# Patient Record
Sex: Female | Born: 1955
Health system: Southern US, Community
[De-identification: ages and names within clinical notes are randomized; demographics above are authoritative.]

## PROBLEM LIST (undated history)

## (undated) DIAGNOSIS — D649 Anemia, unspecified: Secondary | ICD-10-CM

## (undated) DIAGNOSIS — E079 Disorder of thyroid, unspecified: Secondary | ICD-10-CM

## (undated) DIAGNOSIS — R000 Tachycardia, unspecified: Secondary | ICD-10-CM

## (undated) DIAGNOSIS — IMO0002 Reserved for concepts with insufficient information to code with codable children: Secondary | ICD-10-CM

## (undated) DIAGNOSIS — K2970 Gastritis, unspecified, without bleeding: Secondary | ICD-10-CM

## (undated) DIAGNOSIS — K760 Fatty (change of) liver, not elsewhere classified: Secondary | ICD-10-CM

## (undated) DIAGNOSIS — H269 Unspecified cataract: Secondary | ICD-10-CM

## (undated) DIAGNOSIS — I10 Essential (primary) hypertension: Secondary | ICD-10-CM

## (undated) DIAGNOSIS — K648 Other hemorrhoids: Secondary | ICD-10-CM

## (undated) DIAGNOSIS — D126 Benign neoplasm of colon, unspecified: Secondary | ICD-10-CM

## (undated) DIAGNOSIS — R0681 Apnea, not elsewhere classified: Secondary | ICD-10-CM

## (undated) DIAGNOSIS — R011 Cardiac murmur, unspecified: Secondary | ICD-10-CM

## (undated) DIAGNOSIS — E0590 Thyrotoxicosis, unspecified without thyrotoxic crisis or storm: Secondary | ICD-10-CM

## (undated) DIAGNOSIS — M1990 Unspecified osteoarthritis, unspecified site: Secondary | ICD-10-CM

## (undated) DIAGNOSIS — K2940 Chronic atrophic gastritis without bleeding: Secondary | ICD-10-CM

## (undated) DIAGNOSIS — M8580 Other specified disorders of bone density and structure, unspecified site: Secondary | ICD-10-CM

## (undated) DIAGNOSIS — M25511 Pain in right shoulder: Secondary | ICD-10-CM

## (undated) HISTORY — DX: Other hemorrhoids: K64.8

## (undated) HISTORY — DX: Thyrotoxicosis, unspecified without thyrotoxic crisis or storm: E05.90

## (undated) HISTORY — PX: TUBAL LIGATION: SHX77

## (undated) HISTORY — DX: Chronic atrophic gastritis without bleeding: K29.40

## (undated) HISTORY — PX: COLONOSCOPY: SHX174

## (undated) HISTORY — DX: Essential (primary) hypertension: I10

## (undated) HISTORY — DX: Tachycardia, unspecified: R00.0

## (undated) HISTORY — DX: Unspecified osteoarthritis, unspecified site: M19.90

## (undated) HISTORY — DX: Gastritis, unspecified, without bleeding: K29.70

## (undated) HISTORY — DX: Disorder of thyroid, unspecified: E07.9

## (undated) HISTORY — DX: Benign neoplasm of colon, unspecified: D12.6

## (undated) HISTORY — PX: UPPER GASTROINTESTINAL ENDOSCOPY: SHX188

## (undated) HISTORY — DX: Apnea, not elsewhere classified: R06.81

## (undated) HISTORY — DX: Anemia, unspecified: D64.9

## (undated) HISTORY — PX: HAND SURGERY: SHX662

## (undated) HISTORY — DX: Pain in right shoulder: M25.511

## (undated) HISTORY — DX: Cardiac murmur, unspecified: R01.1

## (undated) HISTORY — DX: Unspecified cataract: H26.9

## (undated) HISTORY — DX: Fatty (change of) liver, not elsewhere classified: K76.0

## (undated) HISTORY — DX: Reserved for concepts with insufficient information to code with codable children: IMO0002

## (undated) HISTORY — DX: Other specified disorders of bone density and structure, unspecified site: M85.80

---

## 1999-08-17 DIAGNOSIS — M25511 Pain in right shoulder: Secondary | ICD-10-CM

## 1999-08-17 HISTORY — DX: Pain in right shoulder: M25.511

## 2000-08-16 HISTORY — PX: SHOULDER SURGERY: SHX246

## 2010-08-16 HISTORY — PX: BREAST CYST ASPIRATION: SHX578

## 2011-07-04 LAB — HM COLONOSCOPY

## 2012-08-16 HISTORY — PX: SHOULDER SURGERY: SHX246

## 2013-06-05 ENCOUNTER — Telehealth: Payer: Self-pay | Admitting: Internal Medicine

## 2013-06-05 NOTE — Telephone Encounter (Signed)
Ok Thx 

## 2013-06-05 NOTE — Telephone Encounter (Signed)
Ms. Garmany just moved to [facility].  She is Clara Mapp's sister and Trey Paula Mines's Aunt.  Trey Paula called to ask if you will take her as a patient.  She has [employer].

## 2013-06-06 NOTE — Telephone Encounter (Signed)
Pt aware.  Appt Nov 4.

## 2013-06-19 ENCOUNTER — Ambulatory Visit: Payer: Self-pay | Admitting: Internal Medicine

## 2013-07-03 ENCOUNTER — Encounter: Payer: Self-pay | Admitting: Internal Medicine

## 2013-07-03 ENCOUNTER — Ambulatory Visit (INDEPENDENT_AMBULATORY_CARE_PROVIDER_SITE_OTHER): Payer: BC Managed Care – PPO | Admitting: Internal Medicine

## 2013-07-03 ENCOUNTER — Other Ambulatory Visit (INDEPENDENT_AMBULATORY_CARE_PROVIDER_SITE_OTHER): Payer: BC Managed Care – PPO

## 2013-07-03 VITALS — BP 130/88 | HR 76 | Temp 98.8°F | Resp 16 | Ht 64.0 in | Wt 169.0 lb

## 2013-07-03 DIAGNOSIS — Z0000 Encounter for general adult medical examination without abnormal findings: Secondary | ICD-10-CM

## 2013-07-03 DIAGNOSIS — S43439A Superior glenoid labrum lesion of unspecified shoulder, initial encounter: Secondary | ICD-10-CM | POA: Insufficient documentation

## 2013-07-03 DIAGNOSIS — S43432S Superior glenoid labrum lesion of left shoulder, sequela: Secondary | ICD-10-CM

## 2013-07-03 DIAGNOSIS — I119 Hypertensive heart disease without heart failure: Secondary | ICD-10-CM

## 2013-07-03 DIAGNOSIS — I10 Essential (primary) hypertension: Secondary | ICD-10-CM | POA: Insufficient documentation

## 2013-07-03 DIAGNOSIS — E559 Vitamin D deficiency, unspecified: Secondary | ICD-10-CM

## 2013-07-03 DIAGNOSIS — IMO0002 Reserved for concepts with insufficient information to code with codable children: Secondary | ICD-10-CM

## 2013-07-03 DIAGNOSIS — E0590 Thyrotoxicosis, unspecified without thyrotoxic crisis or storm: Secondary | ICD-10-CM

## 2013-07-03 LAB — URINALYSIS
Ketones, ur: NEGATIVE
Nitrite: NEGATIVE
Total Protein, Urine: NEGATIVE
Urine Glucose: NEGATIVE
pH: 6 (ref 5.0–8.0)

## 2013-07-03 LAB — CBC WITH DIFFERENTIAL/PLATELET
Basophils Relative: 0.6 % (ref 0.0–3.0)
Eosinophils Absolute: 0 10*3/uL (ref 0.0–0.7)
HCT: 39.1 % (ref 36.0–46.0)
Hemoglobin: 13.4 g/dL (ref 12.0–15.0)
Lymphocytes Relative: 31.1 % (ref 12.0–46.0)
Lymphs Abs: 1.6 10*3/uL (ref 0.7–4.0)
MCHC: 34.3 g/dL (ref 30.0–36.0)
MCV: 89.7 fl (ref 78.0–100.0)
Monocytes Absolute: 0.3 10*3/uL (ref 0.1–1.0)
Neutro Abs: 3.2 10*3/uL (ref 1.4–7.7)
Platelets: 360 10*3/uL (ref 150.0–400.0)
RBC: 4.36 Mil/uL (ref 3.87–5.11)

## 2013-07-03 LAB — LIPID PANEL
HDL: 39.6 mg/dL (ref >39.00)
LDL Cholesterol: 88 mg/dL (ref 0–99)
Total CHOL/HDL Ratio: 4
VLDL: 24.2 mg/dL (ref 0.0–40.0)

## 2013-07-03 LAB — BASIC METABOLIC PANEL
CO2: 31 mEq/L (ref 19–32)
Calcium: 9.4 mg/dL (ref 8.4–10.5)
Chloride: 101 mEq/L (ref 96–112)
Creatinine, Ser: 0.8 mg/dL (ref 0.4–1.2)
Potassium: 3.6 mEq/L (ref 3.5–5.1)
Sodium: 138 mEq/L (ref 135–145)

## 2013-07-03 LAB — HEPATIC FUNCTION PANEL
AST: 23 U/L (ref 0–37)
Alkaline Phosphatase: 85 U/L (ref 39–117)
Total Bilirubin: 0.4 mg/dL (ref 0.3–1.2)
Total Protein: 8 g/dL (ref 6.0–8.3)

## 2013-07-03 MED ORDER — VITAMIN D 1000 UNITS PO TABS: 1000.0000 [IU] | ORAL_TABLET | Freq: Every day | ORAL | Status: AC

## 2013-07-03 MED ORDER — ERGOCALCIFEROL 1.25 MG (50000 UT) PO CAPS: 50000.0000 [IU] | ORAL_CAPSULE | ORAL | Status: DC

## 2013-07-03 NOTE — Progress Notes (Signed)
Pre visit review using our clinic review tool, if applicable. No additional management support is needed unless otherwise documented below in the visit note. 

## 2013-07-03 NOTE — Assessment & Plan Note (Addendum)
L 2001 Chronic pain On Avinza and Oxycodone since 2007 per [facility]  Potential benefits of a long term opioids use as well as potential risks (i.e. addiction risk, apnea etc) and complications (i.e. Somnolence, constipation and others) were explained to the patient and were aknowledged.

## 2013-07-03 NOTE — Assessment & Plan Note (Signed)
Chronic Off PTU since 2013 Dr Willaim Bane in [place] 9/14 ok

## 2013-07-03 NOTE — Assessment & Plan Note (Signed)
Start Rx 

## 2013-07-03 NOTE — Progress Notes (Signed)
  Subjective:    HPI  New pt The patient presents to establish PC for chronic hypertension, chronic hyperthyroidism, chronic R shoulder pain controlled w/opioids controlled with medicines. She was recently told that her vit D is low.    Review of Systems  Constitutional: Negative for chills, activity change, appetite change, fatigue and unexpected weight change.  HENT: Negative for congestion, mouth sores and sinus pressure.   Eyes: Negative for visual disturbance.  Respiratory: Negative for cough and chest tightness.   Gastrointestinal: Negative for nausea and abdominal pain.  Genitourinary: Negative for frequency, difficulty urinating and vaginal pain.  Musculoskeletal: Positive for arthralgias (R shoulder). Negative for back pain, gait problem and neck pain.  Skin: Negative for pallor and rash.  Neurological: Negative for dizziness, tremors, weakness, numbness and headaches.  Psychiatric/Behavioral: Negative for suicidal ideas, confusion, sleep disturbance and dysphoric mood. The patient is not nervous/anxious.        Objective:   Physical Exam  Constitutional: She appears well-developed. No distress.  HENT:  Head: Normocephalic.  Right Ear: External ear normal.  Left Ear: External ear normal.  Nose: Nose normal.  Mouth/Throat: Oropharynx is clear and moist.  Eyes: Conjunctivae are normal. Pupils are equal, round, and reactive to light. Right eye exhibits no discharge. Left eye exhibits no discharge.  Neck: Normal range of motion. Neck supple. No JVD present. No tracheal deviation present. No thyromegaly present.  Cardiovascular: Normal rate, regular rhythm and normal heart sounds.   Pulmonary/Chest: No stridor. No respiratory distress. She has no wheezes.  Abdominal: Soft. Bowel sounds are normal. She exhibits no distension and no mass. There is no tenderness. There is no rebound and no guarding.  Musculoskeletal: She exhibits tenderness (R shoulder is tender w/ROM). She  exhibits no edema.  Lymphadenopathy:    She has no cervical adenopathy.  Neurological: She displays normal reflexes. No cranial nerve deficit. She exhibits normal muscle tone. Coordination normal.  Skin: No rash noted. No erythema.  Psychiatric: She has a normal mood and affect. Her behavior is normal. Judgment and thought content normal.     Labs reviewed     Assessment & Plan:

## 2013-07-03 NOTE — Assessment & Plan Note (Signed)
Controlled now Continue with current prescription therapy as reflected on the Med list.

## 2013-07-03 NOTE — Assessment & Plan Note (Signed)
ECHO 2014 moderate LVH Continue with current prescription therapy as reflected on the Med list. Labs

## 2013-07-04 ENCOUNTER — Ambulatory Visit: Payer: BC Managed Care – PPO

## 2013-07-04 DIAGNOSIS — E0590 Thyrotoxicosis, unspecified without thyrotoxic crisis or storm: Secondary | ICD-10-CM

## 2013-07-04 LAB — VITAMIN D 25 HYDROXY (VIT D DEFICIENCY, FRACTURES): Vit D, 25-Hydroxy: 16 ng/mL — ABNORMAL LOW (ref 30–89)

## 2013-07-04 LAB — T4, FREE: Free T4: 1.18 ng/dL (ref 0.60–1.60)

## 2013-08-08 ENCOUNTER — Other Ambulatory Visit: Payer: Self-pay | Admitting: Internal Medicine

## 2013-08-14 ENCOUNTER — Ambulatory Visit (INDEPENDENT_AMBULATORY_CARE_PROVIDER_SITE_OTHER)
Admission: RE | Admit: 2013-08-14 | Discharge: 2013-08-14 | Disposition: A | Discharge status: Home / Self Care | Payer: BC Managed Care – PPO | Source: Ambulatory Visit | Attending: Internal Medicine | Admitting: Internal Medicine

## 2013-08-14 ENCOUNTER — Ambulatory Visit (INDEPENDENT_AMBULATORY_CARE_PROVIDER_SITE_OTHER): Payer: BC Managed Care – PPO | Admitting: Internal Medicine

## 2013-08-14 ENCOUNTER — Encounter: Payer: Self-pay | Admitting: Internal Medicine

## 2013-08-14 VITALS — BP 160/88 | HR 76 | Temp 98.1°F | Resp 16 | Wt 169.0 lb

## 2013-08-14 DIAGNOSIS — R042 Hemoptysis: Secondary | ICD-10-CM | POA: Insufficient documentation

## 2013-08-14 DIAGNOSIS — H6690 Otitis media, unspecified, unspecified ear: Secondary | ICD-10-CM

## 2013-08-14 DIAGNOSIS — H6692 Otitis media, unspecified, left ear: Secondary | ICD-10-CM

## 2013-08-14 DIAGNOSIS — J069 Acute upper respiratory infection, unspecified: Secondary | ICD-10-CM | POA: Insufficient documentation

## 2013-08-14 MED ORDER — OLMESARTAN-AMLODIPINE-HCTZ 20-5-12.5 MG PO TABS: 1.0000 | ORAL_TABLET | Freq: Every day | ORAL | Status: DC

## 2013-08-14 MED ORDER — MORPHINE SULFATE ER BEADS 60 MG PO CP24: 60.0000 mg | ORAL_CAPSULE | Freq: Every day | ORAL | Status: DC

## 2013-08-14 MED ORDER — LEVOFLOXACIN 500 MG PO TABS: 500.0000 mg | ORAL_TABLET | Freq: Every day | ORAL | Status: DC

## 2013-08-14 NOTE — Assessment & Plan Note (Signed)
12/16 likely due to sinusitis/bronchitis CXR Levaquin x 10 d 

## 2013-08-14 NOTE — Progress Notes (Signed)
   Subjective:    URI  This is a new problem. The current episode started in the past 7 days. The problem has been gradually worsening. There has been no fever. Associated symptoms include congestion, coughing, sinus pain, a sore throat and swollen glands. Pertinent negatives include no abdominal pain, chest pain, headaches, nausea, neck pain or rash. Associated symptoms comments: Coughed up blood. She has tried antihistamine, decongestant and NSAIDs for the symptoms. The treatment provided mild relief.     The patient presents  For a f/up on chronic hypertension, chronic hyperthyroidism, chronic R shoulder pain controlled w/opioids controlled with medicines. She was recently told that her vit D is low.    Review of Systems  Constitutional: Negative for chills, activity change, appetite change, fatigue and unexpected weight change.  HENT: Positive for congestion and sore throat. Negative for mouth sores and sinus pressure.   Eyes: Negative for visual disturbance.  Respiratory: Positive for cough. Negative for chest tightness.   Cardiovascular: Negative for chest pain.  Gastrointestinal: Negative for nausea and abdominal pain.  Genitourinary: Negative for frequency, difficulty urinating and vaginal pain.  Musculoskeletal: Positive for arthralgias (R shoulder). Negative for back pain, gait problem and neck pain.  Skin: Negative for pallor and rash.  Neurological: Negative for dizziness, tremors, weakness, numbness and headaches.  Psychiatric/Behavioral: Negative for suicidal ideas, confusion, sleep disturbance and dysphoric mood. The patient is not nervous/anxious.        Objective:   Physical Exam  Constitutional: She appears well-developed. No distress.  HENT:  Head: Normocephalic.  Right Ear: External ear normal.  Left Ear: External ear normal.  Nose: Nose normal.  Mouth/Throat: Oropharynx is clear and moist.  Eyes: Conjunctivae are normal. Pupils are equal, round, and reactive  to light. Right eye exhibits no discharge. Left eye exhibits no discharge.  Neck: Normal range of motion. Neck supple. No JVD present. No tracheal deviation present. No thyromegaly present.  Cardiovascular: Normal rate, regular rhythm and normal heart sounds.   Pulmonary/Chest: No stridor. No respiratory distress. She has no wheezes.  Abdominal: Soft. Bowel sounds are normal. She exhibits no distension and no mass. There is no tenderness. There is no rebound and no guarding.  Musculoskeletal: She exhibits tenderness (R shoulder is tender w/ROM). She exhibits no edema.  Lymphadenopathy:    She has no cervical adenopathy.  Neurological: She displays normal reflexes. No cranial nerve deficit. She exhibits normal muscle tone. Coordination normal.  Skin: No rash noted. No erythema.  Psychiatric: She has a normal mood and affect. Her behavior is normal. Judgment and thought content normal.  pt brought bloody mucus in the plastic bag   Labs reviewed     Assessment & Plan:

## 2013-08-14 NOTE — Patient Instructions (Signed)
Use over-the-counter  "cold" medicines  such as  "Afrin" nasal spray for nasal congestion as directed instead. Use" Delsym" or" Robitussin" cough syrup varietis for cough.  You can use plain "Tylenol" or "Advil" for fever, chills and achyness.  Please, make an appointment if you are not better or if you're worse.  

## 2013-08-14 NOTE — Assessment & Plan Note (Signed)
12/16 likely due to sinusitis/bronchitis CXR Levaquin x 10 d

## 2013-08-14 NOTE — Progress Notes (Signed)
Pre visit review using our clinic review tool, if applicable. No additional management support is needed unless otherwise documented below in the visit note. 

## 2013-08-17 ENCOUNTER — Telehealth: Payer: Self-pay | Admitting: Internal Medicine

## 2013-08-17 NOTE — Telephone Encounter (Signed)
Patient called stating the antibiotic that was given to her last week is not helping, patient would like to know if she can  Get drops for her ear - please advise

## 2013-08-19 NOTE — Telephone Encounter (Signed)
Pls come for a ROV - we need to re-examine her ear. Thx

## 2013-08-20 ENCOUNTER — Ambulatory Visit (INDEPENDENT_AMBULATORY_CARE_PROVIDER_SITE_OTHER): Payer: BC Managed Care – PPO | Admitting: Internal Medicine

## 2013-08-20 ENCOUNTER — Encounter: Payer: Self-pay | Admitting: Internal Medicine

## 2013-08-20 VITALS — BP 138/90 | HR 76 | Temp 98.1°F | Resp 16 | Wt 166.0 lb

## 2013-08-20 DIAGNOSIS — R042 Hemoptysis: Secondary | ICD-10-CM

## 2013-08-20 DIAGNOSIS — H6690 Otitis media, unspecified, unspecified ear: Secondary | ICD-10-CM

## 2013-08-20 DIAGNOSIS — J069 Acute upper respiratory infection, unspecified: Secondary | ICD-10-CM

## 2013-08-20 DIAGNOSIS — J329 Chronic sinusitis, unspecified: Secondary | ICD-10-CM

## 2013-08-20 DIAGNOSIS — R51 Headache: Secondary | ICD-10-CM

## 2013-08-20 DIAGNOSIS — A499 Bacterial infection, unspecified: Secondary | ICD-10-CM

## 2013-08-20 DIAGNOSIS — H6692 Otitis media, unspecified, left ear: Secondary | ICD-10-CM

## 2013-08-20 DIAGNOSIS — B9689 Other specified bacterial agents as the cause of diseases classified elsewhere: Secondary | ICD-10-CM | POA: Insufficient documentation

## 2013-08-20 MED ORDER — FLUTICASONE PROPIONATE 50 MCG/ACT NA SUSP: 1.0000 | Freq: Two times a day (BID) | NASAL | Status: DC | PRN

## 2013-08-20 MED ORDER — PSEUDOEPHEDRINE HCL ER 120 MG PO TB12: 120.0000 mg | ORAL_TABLET | Freq: Two times a day (BID) | ORAL | Status: DC | PRN

## 2013-08-20 NOTE — Progress Notes (Signed)
Patient ID: Karina Newman, female   DOB: August 01, 1956, 58 y.o.   MRN: [id number]   Subjective:    URI  This is a new problem. The current episode started 1 to 4 weeks ago. The problem has been gradually worsening. There has been no fever. Associated symptoms include congestion, coughing, sinus pain, a sore throat and swollen glands. Pertinent negatives include no abdominal pain, chest pain, headaches, nausea, neck pain or rash. Associated symptoms comments: Coughed up blood. She has tried antihistamine, decongestant and NSAIDs (Levaquin) for the symptoms. The treatment provided no relief.     The patient presents  For a f/up on chronic hypertension, chronic hyperthyroidism, chronic R shoulder pain controlled w/opioids controlled with medicines. She was recently told that her vit D is low.    Review of Systems  Constitutional: Negative for chills, activity change, appetite change, fatigue and unexpected weight change.  HENT: Positive for congestion and sore throat. Negative for mouth sores and sinus pressure.   Eyes: Negative for visual disturbance.  Respiratory: Positive for cough. Negative for chest tightness.   Cardiovascular: Negative for chest pain.  Gastrointestinal: Negative for nausea and abdominal pain.  Genitourinary: Negative for frequency, difficulty urinating and vaginal pain.  Musculoskeletal: Positive for arthralgias (R shoulder). Negative for back pain, gait problem and neck pain.  Skin: Negative for pallor and rash.  Neurological: Negative for dizziness, tremors, weakness, numbness and headaches.  Psychiatric/Behavioral: Negative for suicidal ideas, confusion, sleep disturbance and dysphoric mood. The patient is not nervous/anxious.        Objective:   Physical Exam  Constitutional: She appears well-developed. No distress.  HENT:  Head: Normocephalic.  Right Ear: External ear normal.  Left Ear: External ear normal.  Nose: Nose normal.  Mouth/Throat: Oropharynx is  clear and moist.  Eyes: Conjunctivae are normal. Pupils are equal, round, and reactive to light. Right eye exhibits no discharge. Left eye exhibits no discharge.  Neck: Normal range of motion. Neck supple. No JVD present. No tracheal deviation present. No thyromegaly present.  Cardiovascular: Normal rate, regular rhythm and normal heart sounds.   Pulmonary/Chest: No stridor. No respiratory distress. She has no wheezes.  Abdominal: Soft. Bowel sounds are normal. She exhibits no distension and no mass. There is no tenderness. There is no rebound and no guarding.  Musculoskeletal: She exhibits tenderness (R shoulder is tender w/ROM). She exhibits no edema.  Lymphadenopathy:    She has no cervical adenopathy.  Neurological: She displays normal reflexes. No cranial nerve deficit. She exhibits normal muscle tone. Coordination normal.  Skin: No rash noted. No erythema.  Psychiatric: She has a normal mood and affect. Her behavior is normal. Judgment and thought content normal.  pt brought bloody mucus in the plastic bag   Labs reviewed     Assessment & Plan:

## 2013-08-20 NOTE — Assessment & Plan Note (Signed)
Cont Rx CT sinuses Sudafed, Flonase Rx 

## 2013-08-20 NOTE — Progress Notes (Signed)
Pre visit review using our clinic review tool, if applicable. No additional management support is needed unless otherwise documented below in the visit note. 

## 2013-08-20 NOTE — Assessment & Plan Note (Signed)
Cont Rx CT sinuses Sudafed, Flonase Rx

## 2013-08-20 NOTE — Assessment & Plan Note (Signed)
Better  

## 2013-08-22 ENCOUNTER — Encounter (INDEPENDENT_AMBULATORY_CARE_PROVIDER_SITE_OTHER): Payer: Self-pay

## 2013-08-22 ENCOUNTER — Ambulatory Visit (INDEPENDENT_AMBULATORY_CARE_PROVIDER_SITE_OTHER)
Admission: RE | Admit: 2013-08-22 | Discharge: 2013-08-22 | Disposition: A | Discharge status: Home / Self Care | Payer: BC Managed Care – PPO | Source: Ambulatory Visit | Attending: Internal Medicine | Admitting: Internal Medicine

## 2013-08-22 DIAGNOSIS — A499 Bacterial infection, unspecified: Secondary | ICD-10-CM

## 2013-08-22 DIAGNOSIS — R51 Headache: Secondary | ICD-10-CM

## 2013-08-22 DIAGNOSIS — J329 Chronic sinusitis, unspecified: Secondary | ICD-10-CM

## 2013-08-22 DIAGNOSIS — B9689 Other specified bacterial agents as the cause of diseases classified elsewhere: Secondary | ICD-10-CM

## 2013-10-17 ENCOUNTER — Ambulatory Visit (INDEPENDENT_AMBULATORY_CARE_PROVIDER_SITE_OTHER): Payer: Worker's Compensation | Admitting: Internal Medicine

## 2013-10-17 ENCOUNTER — Encounter: Payer: Self-pay | Admitting: Internal Medicine

## 2013-10-17 VITALS — BP 150/90 | HR 80 | Temp 98.6°F | Resp 16 | Wt 174.0 lb

## 2013-10-17 DIAGNOSIS — M25512 Pain in left shoulder: Secondary | ICD-10-CM

## 2013-10-17 DIAGNOSIS — I10 Essential (primary) hypertension: Secondary | ICD-10-CM

## 2013-10-17 DIAGNOSIS — S43439A Superior glenoid labrum lesion of unspecified shoulder, initial encounter: Secondary | ICD-10-CM

## 2013-10-17 DIAGNOSIS — M25511 Pain in right shoulder: Secondary | ICD-10-CM

## 2013-10-17 DIAGNOSIS — S43499A Other sprain of unspecified shoulder joint, initial encounter: Secondary | ICD-10-CM

## 2013-10-17 DIAGNOSIS — M25519 Pain in unspecified shoulder: Secondary | ICD-10-CM

## 2013-10-17 DIAGNOSIS — S46819A Strain of other muscles, fascia and tendons at shoulder and upper arm level, unspecified arm, initial encounter: Secondary | ICD-10-CM

## 2013-10-17 DIAGNOSIS — E559 Vitamin D deficiency, unspecified: Secondary | ICD-10-CM

## 2013-10-17 MED ORDER — MORPHINE SULFATE ER BEADS 60 MG PO CP24: 60.0000 mg | ORAL_CAPSULE | Freq: Every day | ORAL | Status: DC

## 2013-10-17 MED ORDER — OXYCODONE HCL 15 MG PO TABS: 15.0000 mg | ORAL_TABLET | Freq: Three times a day (TID) | ORAL | Status: DC | PRN

## 2013-10-17 NOTE — Progress Notes (Signed)
   Subjective:    HPI   The patient presents  For a f/up on chronic hypertension, chronic hyperthyroidism, chronic R shoulder pain controlled w/opioids controlled with medicines - worse lately.   She was recently told that her vit D is low.    Review of Systems  Constitutional: Negative for chills, activity change, appetite change, fatigue and unexpected weight change.  HENT: Negative for mouth sores and sinus pressure.   Eyes: Negative for visual disturbance.  Respiratory: Negative for chest tightness.   Genitourinary: Negative for frequency, difficulty urinating and vaginal pain.  Musculoskeletal: Positive for arthralgias (R shoulder). Negative for back pain and gait problem.  Skin: Negative for pallor.  Neurological: Negative for dizziness, tremors, weakness and numbness.  Psychiatric/Behavioral: Negative for suicidal ideas, confusion, sleep disturbance and dysphoric mood. The patient is not nervous/anxious.        Objective:   Physical Exam  Constitutional: She appears well-developed. No distress.  HENT:  Head: Normocephalic.  Right Ear: External ear normal.  Left Ear: External ear normal.  Nose: Nose normal.  Mouth/Throat: Oropharynx is clear and moist.  Eyes: Conjunctivae are normal. Pupils are equal, round, and reactive to light. Right eye exhibits no discharge. Left eye exhibits no discharge.  Neck: Normal range of motion. Neck supple. No JVD present. No tracheal deviation present. No thyromegaly present.  Cardiovascular: Normal rate, regular rhythm and normal heart sounds.   Pulmonary/Chest: No stridor. No respiratory distress. She has no wheezes.  Abdominal: Soft. Bowel sounds are normal. She exhibits no distension and no mass. There is no tenderness. There is no rebound and no guarding.  Musculoskeletal: She exhibits tenderness (R shoulder is tender w/ROM). She exhibits no edema.  Lymphadenopathy:    She has no cervical adenopathy.  Neurological: She displays  normal reflexes. No cranial nerve deficit. She exhibits normal muscle tone. Coordination normal.  Skin: No rash noted. No erythema.  Psychiatric: She has a normal mood and affect. Her behavior is normal. Judgment and thought content normal.  pt brought bloody mucus in the plastic bag   Labs reviewed     Assessment & Plan:

## 2013-10-17 NOTE — Progress Notes (Signed)
Pre visit review using our clinic review tool, if applicable. No additional management support is needed unless otherwise documented below in the visit note. 

## 2013-10-18 DIAGNOSIS — M25512 Pain in left shoulder: Secondary | ICD-10-CM

## 2013-10-18 DIAGNOSIS — M25511 Pain in right shoulder: Secondary | ICD-10-CM | POA: Insufficient documentation

## 2013-10-18 NOTE — Assessment & Plan Note (Signed)
Continue with current prescription therapy as reflected on the Med list.  

## 2013-10-18 NOTE — Assessment & Plan Note (Signed)
Chronic  Potential benefits of a long term opioids use as well as potential risks (i.e. addiction risk, apnea etc) and complications (i.e. Somnolence, constipation and others) were explained to the patient and were aknowledged. Continue with current prescription therapy as reflected on the Med list.  

## 2013-10-18 NOTE — Assessment & Plan Note (Signed)
BP Readings from Last 3 Encounters:  10/17/13 150/90  08/20/13 138/90  08/14/13 160/88  Continue with current prescription therapy as reflected on the Med list.

## 2013-12-11 ENCOUNTER — Telehealth: Payer: Self-pay | Admitting: Internal Medicine

## 2013-12-11 NOTE — Telephone Encounter (Signed)
Patient is requesting refill for avinza.  Patient states pharmacy was not able to fill last scripts until the 16th and she only has three pills left.

## 2013-12-11 NOTE — Telephone Encounter (Signed)
Patient can also be contact on cell phone

## 2013-12-12 MED ORDER — MORPHINE SULFATE ER BEADS 60 MG PO CP24: 60.0000 mg | ORAL_CAPSULE | Freq: Every day | ORAL | Status: DC

## 2013-12-12 NOTE — Telephone Encounter (Signed)
Ok Thx 

## 2013-12-12 NOTE — Telephone Encounter (Signed)
Notified pt rx ready for pick-up. Pt states she didn't need an rx. MD gave her 3 seprated prescription back in march. [facility] already has the prescriptions they was going to call md to ask him was it ok to fill...Karina Newman

## 2013-12-18 ENCOUNTER — Ambulatory Visit: Payer: Self-pay | Admitting: Internal Medicine

## 2014-01-14 ENCOUNTER — Encounter: Payer: Self-pay | Admitting: Gynecology

## 2014-01-14 ENCOUNTER — Ambulatory Visit (INDEPENDENT_AMBULATORY_CARE_PROVIDER_SITE_OTHER): Payer: Federal, State, Local not specified - PPO | Admitting: Gynecology

## 2014-01-14 ENCOUNTER — Other Ambulatory Visit (HOSPITAL_COMMUNITY)
Admission: RE | Admit: 2014-01-14 | Discharge: 2014-01-14 | Disposition: A | Discharge status: Home / Self Care | Payer: BC Managed Care – PPO | Source: Ambulatory Visit | Attending: Gynecology | Admitting: Gynecology

## 2014-01-14 VITALS — BP 126/84 | Ht 64.0 in | Wt 170.0 lb

## 2014-01-14 DIAGNOSIS — N950 Postmenopausal bleeding: Secondary | ICD-10-CM

## 2014-01-14 DIAGNOSIS — Z01419 Encounter for gynecological examination (general) (routine) without abnormal findings: Secondary | ICD-10-CM | POA: Insufficient documentation

## 2014-01-14 DIAGNOSIS — R87810 Cervical high risk human papillomavirus (HPV) DNA test positive: Secondary | ICD-10-CM | POA: Insufficient documentation

## 2014-01-14 DIAGNOSIS — N952 Postmenopausal atrophic vaginitis: Secondary | ICD-10-CM

## 2014-01-14 DIAGNOSIS — IMO0002 Reserved for concepts with insufficient information to code with codable children: Secondary | ICD-10-CM

## 2014-01-14 DIAGNOSIS — D251 Intramural leiomyoma of uterus: Secondary | ICD-10-CM

## 2014-01-14 DIAGNOSIS — Z1151 Encounter for screening for human papillomavirus (HPV): Secondary | ICD-10-CM | POA: Insufficient documentation

## 2014-01-14 HISTORY — DX: Reserved for concepts with insufficient information to code with codable children: IMO0002

## 2014-01-14 NOTE — Addendum Note (Signed)
Addended by: Nelva Nay on: 01/14/2014 12:06 PM   Modules accepted: Orders

## 2014-01-14 NOTE — Progress Notes (Signed)
Karina Newman 11-May-1956 [id number]        59 y.o.  [id number] new patient with several issues as noted below. Has not had a regular GYN exam in a number of years.  Past medical history,surgical history, problem list, medications, allergies, family history and social history were all reviewed and documented as reviewed in the EPIC chart.  ROS:  12 system ROS performed with pertinent positives and negatives included in the history, assessment and plan.  Included Systems: General, HEENT, Neck, Cardiovascular, Pulmonary, Gastrointestinal, Genitourinary, Musculoskeletal, Dermatologic, Endocrine, Hematological, Neurologic, Psychiatric Additional significant findings :  None   Exam: Kim assistant Filed Vitals:   01/14/14 1101  BP: 126/84  Height: 5\' 4"  (1.626 m)  Weight: 170 lb (77.111 kg)   General appearance:  Normal affect, orientation and appearance. Skin: Grossly normal HEENT: Without gross lesions.  No cervical or supraclavicular adenopathy. Thyroid normal.  Lungs:  Clear without wheezing, rales or rhonchi Cardiac: RR, without RMG Abdominal:  Soft, nontender, without masses, guarding, rebound, organomegaly or hernia Breasts:  Examined lying and sitting without masses, retractions, discharge or axillary adenopathy. Pelvic:  Ext/BUS/vagina with blood staining vaginal walls.  Cervix with atrophic changes. Pap/HPV done  Uterus anteverted, normal size, shape and contour, midline and mobile nontender   Adnexa  Without masses or tenderness    Anus and perineum  Normal   Rectovaginal  Normal sphincter tone without palpated masses or tenderness.    Assessment/Plan:  58 y.o. [id number] female for annual exam.   1. Postmenopausal bleeding. Patient notes menopause in the late 84s, initially with significant symptoms such as hot flashes and night sweats but these seem to have result. Has had no bleeding whatsoever up until the last several weeks when she has started to notice some bright red vaginal  staining. Does have a history of leiomyoma in the past although exam today palpates normal. Pap smear done today. We'll plan sonohysterogram to rule out endometrial abnormalities and she will schedule this. Differential up to and including uterine cancer reviewed. 2. Vaginal dryness with intercourse. Is not having any issues otherwise but notes discomfort and irritation with intercourse. Options to include OTC products as well as vaginal estrogen such as Vagifem Estrace Premarin and Osphena reviewed. Patient is not interested in prescription products but prefers trial of OTC products. She is not having global symptoms to warrant HRT at this time. 3. Pap smear number of years ago. Pap/HPV today. No history of abnormal Pap smears previously. 4. Mammography 01/2013. Will schedule followup mammogram this month. SBE monthly reviewed. 5. Colonoscopy 2012. Repeat at their recommended interval. 6. DEXA never. We'll plan further into the menopause at age 3. Increase calcium vitamin D reviewed. 7. Health maintenance. No lab work done as she actively sees Dr. Alain Marion who does all of her lab work. Followup for sonohysterogram.   Note: This document was prepared with digital dictation and possible smart phrase technology. Any transcriptional errors that result from this process are unintentional.   Anastasio Auerbach MD, 11:56 AM 01/14/2014

## 2014-01-14 NOTE — Patient Instructions (Signed)
Follow up for ultrasound as scheduled 

## 2014-01-15 LAB — CYTOLOGY - PAP

## 2014-01-21 ENCOUNTER — Encounter: Payer: Self-pay | Admitting: Gynecology

## 2014-01-23 ENCOUNTER — Encounter: Payer: Self-pay | Admitting: Gynecology

## 2014-02-04 ENCOUNTER — Ambulatory Visit (INDEPENDENT_AMBULATORY_CARE_PROVIDER_SITE_OTHER): Payer: Worker's Compensation | Admitting: Internal Medicine

## 2014-02-04 ENCOUNTER — Ambulatory Visit (INDEPENDENT_AMBULATORY_CARE_PROVIDER_SITE_OTHER)
Admission: RE | Admit: 2014-02-04 | Discharge: 2014-02-04 | Disposition: A | Discharge status: Home / Self Care | Payer: BC Managed Care – PPO | Source: Ambulatory Visit | Attending: Internal Medicine | Admitting: Internal Medicine

## 2014-02-04 ENCOUNTER — Encounter: Payer: Self-pay | Admitting: Internal Medicine

## 2014-02-04 VITALS — BP 172/86 | HR 76 | Temp 98.5°F | Resp 16 | Wt 173.0 lb

## 2014-02-04 DIAGNOSIS — E559 Vitamin D deficiency, unspecified: Secondary | ICD-10-CM

## 2014-02-04 DIAGNOSIS — M25519 Pain in unspecified shoulder: Secondary | ICD-10-CM

## 2014-02-04 DIAGNOSIS — M25512 Pain in left shoulder: Principal | ICD-10-CM

## 2014-02-04 DIAGNOSIS — M25511 Pain in right shoulder: Secondary | ICD-10-CM

## 2014-02-04 DIAGNOSIS — IMO0002 Reserved for concepts with insufficient information to code with codable children: Secondary | ICD-10-CM

## 2014-02-04 DIAGNOSIS — S43439S Superior glenoid labrum lesion of unspecified shoulder, sequela: Secondary | ICD-10-CM

## 2014-02-04 MED ORDER — OXYCODONE HCL 15 MG PO TABS: 15.0000 mg | ORAL_TABLET | Freq: Three times a day (TID) | ORAL | Status: DC | PRN

## 2014-02-04 MED ORDER — MORPHINE SULFATE ER BEADS 60 MG PO CP24: 60.0000 mg | ORAL_CAPSULE | Freq: Every day | ORAL | Status: DC

## 2014-02-04 NOTE — Assessment & Plan Note (Signed)
Worse  Continue with current prescription therapy as reflected on the Med list. R shoulder and R neck x ray

## 2014-02-04 NOTE — Assessment & Plan Note (Signed)
Continue with current prescription therapy as reflected on the Med list.  

## 2014-02-04 NOTE — Progress Notes (Signed)
Pre visit review using our clinic review tool, if applicable. No additional management support is needed unless otherwise documented below in the visit note. 

## 2014-02-04 NOTE — Assessment & Plan Note (Signed)
L 2001 Chronic pain On Avinza and Oxycodone since 2007 per [facility] R shoulder pain 2015  Potential benefits of a long term opioids use as well as potential risks (i.e. addiction risk, apnea etc) and complications (i.e. Somnolence, constipation and others) were explained to the patient and were aknowledged.

## 2014-02-04 NOTE — Progress Notes (Signed)
   Subjective:    HPI   The patient presents  For a f/up on chronic hypertension, chronic hyperthyroidism. F/u chronic R shoulder pain controlled w/opioids controlled with medicines - much worse lately. The pain is down R arm, hand. C/o muscle spasms in the R chest and R arm.   She was recently told that her vit D is low.    Review of Systems  Constitutional: Negative for chills, activity change, appetite change, fatigue and unexpected weight change.  HENT: Negative for mouth sores and sinus pressure.   Eyes: Negative for visual disturbance.  Respiratory: Negative for chest tightness.   Genitourinary: Negative for frequency, difficulty urinating and vaginal pain.  Musculoskeletal: Positive for arthralgias (R shoulder) and back pain (cervical). Negative for gait problem.  Skin: Negative for pallor.  Neurological: Negative for dizziness, tremors, weakness and numbness.  Psychiatric/Behavioral: Negative for suicidal ideas, confusion, sleep disturbance and dysphoric mood. The patient is not nervous/anxious.    B shoulders are tender w/ROM and stiff; B traps are firm and tender    Objective:   Physical Exam  Constitutional: She appears well-developed. No distress.  HENT:  Head: Normocephalic.  Right Ear: External ear normal.  Left Ear: External ear normal.  Nose: Nose normal.  Mouth/Throat: Oropharynx is clear and moist.  Eyes: Conjunctivae are normal. Pupils are equal, round, and reactive to light. Right eye exhibits no discharge. Left eye exhibits no discharge.  Neck: Normal range of motion. Neck supple. No JVD present. No tracheal deviation present. No thyromegaly present.  Cardiovascular: Normal rate, regular rhythm and normal heart sounds.   Pulmonary/Chest: No stridor. No respiratory distress. She has no wheezes.  Abdominal: Soft. Bowel sounds are normal. She exhibits no distension and no mass. There is no tenderness. There is no rebound and no guarding.  Musculoskeletal: She  exhibits tenderness (R shoulder is tender w/ROM). She exhibits no edema.  Lymphadenopathy:    She has no cervical adenopathy.  Neurological: She displays normal reflexes. No cranial nerve deficit. She exhibits normal muscle tone. Coordination normal.  Skin: No rash noted. No erythema.  Psychiatric: She has a normal mood and affect. Her behavior is normal. Judgment and thought content normal.     Labs reviewed     Assessment & Plan:

## 2014-02-06 ENCOUNTER — Other Ambulatory Visit: Payer: Self-pay | Admitting: Gynecology

## 2014-02-06 DIAGNOSIS — N950 Postmenopausal bleeding: Secondary | ICD-10-CM

## 2014-02-08 ENCOUNTER — Encounter: Payer: Self-pay | Admitting: Gynecology

## 2014-02-08 ENCOUNTER — Ambulatory Visit (INDEPENDENT_AMBULATORY_CARE_PROVIDER_SITE_OTHER): Payer: Federal, State, Local not specified - PPO

## 2014-02-08 ENCOUNTER — Ambulatory Visit (INDEPENDENT_AMBULATORY_CARE_PROVIDER_SITE_OTHER): Payer: Federal, State, Local not specified - PPO | Admitting: Gynecology

## 2014-02-08 DIAGNOSIS — N950 Postmenopausal bleeding: Secondary | ICD-10-CM

## 2014-02-08 DIAGNOSIS — N882 Stricture and stenosis of cervix uteri: Secondary | ICD-10-CM

## 2014-02-08 NOTE — Patient Instructions (Addendum)
Follow up for biopsy results.

## 2014-02-08 NOTE — Progress Notes (Signed)
Karina Newman [birth year] [id number]        58 y.o.  Karina Newman presents for sonohysterogram due to history of postmenopausal spotting.  Menopause in the late 40s, initially with significant symptoms such as hot flashes and night sweats but these seem to have result. Has had no bleeding whatsoever up until the last several weeks when she has started to notice some bright red vaginal staining. Does have a history of leiomyoma in the past.   Past medical history,surgical history, problem list, medications, allergies, family history and social history were all reviewed and documented in the EPIC chart.  Directed ROS with pertinent positives and negatives documented in the history of present illness/assessment and plan.  Ultrasound shows uterus generous in size. Multiple Korbyn Newman myomas largest measuring 31 mm. Endometrial echo 5.5 mm. Right and left ovaries normal. Cul-de-sac negative.  Sonohysterogram: Cervix was cleansed with Betadine and initial attempt to pass the stone histogram catheter was unsuccessful due to cervical stenosis. A paracervical block was placed, anterior lip of the cervix grasped with a single-tooth tenaculum and the cervix was gently dilated to admit the sonohysterogram catheter.  There was good distention with no abnormalities. A scant endometrial sample was obtained.  Assessment/Plan:  58 y.o. Karina Newman with postmenopausal spotting. Ultrasound shows multiple Karina Newman myomas but no intracavitary abnormalities. An endometrial sample was taken with scant return consistent with an atrophic pattern. Patient will followup for biopsy results. Assuming negative in plan expectant management. Report if vaginal bleeding continues.   Note: This document was prepared with digital dictation and possible smart phrase technology. Any transcriptional errors that result from this process are unintentional.   Anastasio Auerbach MD, 1:10 PM 02/08/2014

## 2014-02-19 ENCOUNTER — Telehealth: Payer: Self-pay | Admitting: *Deleted

## 2014-02-19 NOTE — Telephone Encounter (Signed)
Left mess for patient to call back re: 02/04/14 UDS results. Dr. Alain Marion wants to know who prescribed her Temazepam and Cyclobenzaprine.

## 2014-02-19 NOTE — Telephone Encounter (Signed)
Message copied by Thamas Jaegers on Tue Feb 19, 2014  2:45 PM ------      Message from: Karina Newman      Created: Tue Feb 19, 2014  9:44 AM       Patient was to schedule a colposcopy appointment. I do not see where it has been scheduled. ------

## 2014-02-19 NOTE — Telephone Encounter (Signed)
Left message for pt to call.

## 2014-02-20 NOTE — Telephone Encounter (Signed)
Patient returned call in voice mail. I called her back and explained why we are calling and asked her to call and let me know what is going on with her appt C&B. (It was scheduled for 7/21//15 but it is now cancelled.)

## 2014-03-01 ENCOUNTER — Ambulatory Visit (INDEPENDENT_AMBULATORY_CARE_PROVIDER_SITE_OTHER): Payer: Worker's Compensation | Admitting: Internal Medicine

## 2014-03-01 ENCOUNTER — Encounter: Payer: Self-pay | Admitting: Internal Medicine

## 2014-03-01 ENCOUNTER — Other Ambulatory Visit (INDEPENDENT_AMBULATORY_CARE_PROVIDER_SITE_OTHER): Payer: BC Managed Care – PPO

## 2014-03-01 ENCOUNTER — Ambulatory Visit: Payer: Self-pay | Admitting: Internal Medicine

## 2014-03-01 VITALS — BP 135/85 | HR 88 | Temp 98.1°F | Resp 16 | Wt 167.0 lb

## 2014-03-01 DIAGNOSIS — M222X1 Patellofemoral disorders, right knee: Secondary | ICD-10-CM

## 2014-03-01 DIAGNOSIS — I10 Essential (primary) hypertension: Secondary | ICD-10-CM

## 2014-03-01 DIAGNOSIS — E0590 Thyrotoxicosis, unspecified without thyrotoxic crisis or storm: Secondary | ICD-10-CM

## 2014-03-01 DIAGNOSIS — M25569 Pain in unspecified knee: Secondary | ICD-10-CM

## 2014-03-01 DIAGNOSIS — M25511 Pain in right shoulder: Secondary | ICD-10-CM

## 2014-03-01 DIAGNOSIS — M25519 Pain in unspecified shoulder: Secondary | ICD-10-CM

## 2014-03-01 DIAGNOSIS — M222X2 Patellofemoral disorders, left knee: Secondary | ICD-10-CM

## 2014-03-01 DIAGNOSIS — M25512 Pain in left shoulder: Secondary | ICD-10-CM

## 2014-03-01 LAB — BASIC METABOLIC PANEL
BUN: 10 mg/dL (ref 6–23)
CALCIUM: 9.4 mg/dL (ref 8.4–10.5)
CHLORIDE: 100 meq/L (ref 96–112)
CO2: 32 mEq/L (ref 19–32)
Creatinine, Ser: 0.9 mg/dL (ref 0.4–1.2)
GFR: 85.16 mL/min (ref >60.00)
Glucose, Bld: 76 mg/dL (ref 70–99)
Potassium: 3.8 mEq/L (ref 3.5–5.1)
Sodium: 140 mEq/L (ref 135–145)

## 2014-03-01 LAB — T4, FREE: Free T4: 0.76 ng/dL (ref 0.60–1.60)

## 2014-03-01 LAB — TSH: TSH: 0.07 u[IU]/mL — ABNORMAL LOW (ref 0.35–4.50)

## 2014-03-01 NOTE — Patient Instructions (Signed)
Patellofemoral Pain  Your exam shows your knee pain is probably due to a problem with the knee cap, the patella. This problem is also called patellofemoral pain, runner's knee, or chondromalacia. Most of the time, this problem is due to overuse of the knee joint. Repeated bending and straightening can irritate the underside of the knee cap. When this happens, activities such as running, walking, climbing, biking or jumping usually produce pain. Pain may also occur after prolonged sitting. Other patellofemoral symptoms can include joint stiffness, swelling, and a snapping or grinding sensation with movement. Rest and rehabilitation are usually successful in treating this problem. Surgery is rarely needed.  Treatment includes correcting any mechanical factors that could hurt the normal working of the knee. This could be weak thigh muscles or foot problems. Avoid repetitive activities of the knee until the pain and other symptoms improve. Apply ice packs over the knee for 20 to 30 minutes every 2 to 4 hours to reduce pain and swelling. Only take over-the-counter or prescription medicines for pain, discomfort, or fever as directed by your caregiver. Knee braces or neoprene sleeves may help reduce irritation. Rehabilitation exercises to strengthen the quad muscle are often prescribed when your symptoms are better. Call your caregiver for a follow-up exam to evaluate your response to treatment.  Document Released: 09/09/2004 Document Revised: 10/25/2011 Document Reviewed: 08/02/2005  Exit[employer]® Patient Information ©2015 [employer]. This information is not intended to replace advice given to you by your health care provider. Make sure you discuss any questions you have with your health care provider.

## 2014-03-01 NOTE — Assessment & Plan Note (Signed)
Exercises:

## 2014-03-01 NOTE — Progress Notes (Signed)
Pre visit review using our clinic review tool, if applicable. No additional management support is needed unless otherwise documented below in the visit note. 

## 2014-03-01 NOTE — Progress Notes (Signed)
   Subjective:    HPI   The patient presents  For a f/up on chronic hypertension, chronic hyperthyroidism. F/u chronic R shoulder pain controlled w/opioids controlled with medicines - much worse lately. The pain is down R arm, hand. C/o muscle spasms in the R chest and R arm.  C/o knee pain B   She was recently told that her vit D is low.  Wt Readings from Last 3 Encounters:  03/01/14 167 lb (75.751 kg)  02/04/14 173 lb (78.472 kg)  01/14/14 170 lb (77.111 kg)   BP Readings from Last 3 Encounters:  03/01/14 158/94  02/04/14 172/86  01/14/14 126/84       Review of Systems  Constitutional: Negative for chills, activity change, appetite change, fatigue and unexpected weight change.  HENT: Negative for mouth sores and sinus pressure.   Eyes: Negative for visual disturbance.  Respiratory: Negative for chest tightness.   Genitourinary: Negative for frequency, difficulty urinating and vaginal pain.  Musculoskeletal: Positive for arthralgias (R shoulder) and back pain (cervical). Negative for gait problem.  Skin: Negative for pallor.  Neurological: Negative for dizziness, tremors, weakness and numbness.  Psychiatric/Behavioral: Negative for suicidal ideas, confusion, sleep disturbance and dysphoric mood. The patient is not nervous/anxious.    B shoulders are tender w/ROM and stiff; B traps are firm and tender    Objective:   Physical Exam  Constitutional: She appears well-developed. No distress.  HENT:  Head: Normocephalic.  Right Ear: External ear normal.  Left Ear: External ear normal.  Nose: Nose normal.  Mouth/Throat: Oropharynx is clear and moist.  Eyes: Conjunctivae are normal. Pupils are equal, round, and reactive to light. Right eye exhibits no discharge. Left eye exhibits no discharge.  Neck: Normal range of motion. Neck supple. No JVD present. No tracheal deviation present. No thyromegaly present.  Cardiovascular: Normal rate, regular rhythm and normal heart  sounds.   Pulmonary/Chest: No stridor. No respiratory distress. She has no wheezes.  Abdominal: Soft. Bowel sounds are normal. She exhibits no distension and no mass. There is no tenderness. There is no rebound and no guarding.  Musculoskeletal: She exhibits tenderness (R shoulder is tender w/ROM). She exhibits no edema.  Lymphadenopathy:    She has no cervical adenopathy.  Neurological: She displays normal reflexes. No cranial nerve deficit. She exhibits normal muscle tone. Coordination normal.  Skin: No rash noted. No erythema.  Psychiatric: She has a normal mood and affect. Her behavior is normal. Judgment and thought content normal.  B patellas are tender  Lab Results  Component Value Date   WBC 5.3 07/03/2013   HGB 13.4 07/03/2013   HCT 39.1 07/03/2013   PLT 360.0 07/03/2013   GLUCOSE 100* 07/03/2013   CHOL 152 07/03/2013   TRIG 121.0 07/03/2013   HDL 39.60 07/03/2013   LDLCALC 88 07/03/2013   ALT 27 07/03/2013   AST 23 07/03/2013   NA 138 07/03/2013   K 3.6 07/03/2013   CL 101 07/03/2013   CREATININE 0.8 07/03/2013   BUN 10 07/03/2013   CO2 31 07/03/2013   TSH 0.04* 07/03/2013     Labs reviewed     Assessment & Plan:

## 2014-03-01 NOTE — Assessment & Plan Note (Signed)
BP Readings from Last 3 Encounters:  03/01/14 135/85  02/04/14 172/86  01/14/14 126/84  Continue with current prescription therapy as reflected on the Med list.

## 2014-03-01 NOTE — Assessment & Plan Note (Signed)
Labs

## 2014-03-01 NOTE — Assessment & Plan Note (Addendum)
Continue with current prescription therapy as reflected on the Med list. Re-start vit D Dug screen could have been (+) due to compound cream She had meds from [place]

## 2014-03-05 ENCOUNTER — Ambulatory Visit: Payer: Federal, State, Local not specified - PPO | Admitting: Gynecology

## 2014-03-05 ENCOUNTER — Other Ambulatory Visit (INDEPENDENT_AMBULATORY_CARE_PROVIDER_SITE_OTHER): Payer: Worker's Compensation

## 2014-03-05 ENCOUNTER — Encounter: Payer: Self-pay | Admitting: Family Medicine

## 2014-03-05 ENCOUNTER — Ambulatory Visit (INDEPENDENT_AMBULATORY_CARE_PROVIDER_SITE_OTHER): Payer: Worker's Compensation | Admitting: Family Medicine

## 2014-03-05 VITALS — BP 130/82 | HR 81 | Ht 64.5 in | Wt 165.0 lb

## 2014-03-05 DIAGNOSIS — M25511 Pain in right shoulder: Secondary | ICD-10-CM

## 2014-03-05 DIAGNOSIS — M25519 Pain in unspecified shoulder: Secondary | ICD-10-CM

## 2014-03-05 DIAGNOSIS — M719 Bursopathy, unspecified: Secondary | ICD-10-CM

## 2014-03-05 DIAGNOSIS — M7551 Bursitis of right shoulder: Secondary | ICD-10-CM

## 2014-03-05 DIAGNOSIS — M67919 Unspecified disorder of synovium and tendon, unspecified shoulder: Secondary | ICD-10-CM

## 2014-03-05 NOTE — Patient Instructions (Signed)
Very nice to meet you.  I want to try a more natural approach.  Exercises 3 times a day On wall with heels, butt shoulders and head touching for goal of 5 minutes daily.  Take tylenol 650 mg three times a day is the best evidence based medicine we have for arthritis.  Glucosamine sulfate 750mg  twice a day is a supplement that has been shown to help moderate to severe arthritis. Vitamin D 2000 IU daily Fish oil 2 grams daily.  Tumeric 500mg  twice daily.  Capsaicin topically up to four times a day may also help with pain. Cortisone injections are an option if these interventions do not seem to make a difference or need more relief.  It's important that you continue to stay active. Consider physical therapy to strengthen muscles around the joint that hurts to take pressure off of the joint itself. Water aerobics and cycling with low resistance are the best two types of exercise for arthritis. Come back and see me in 3 weeks.

## 2014-03-05 NOTE — Assessment & Plan Note (Signed)
Patient did have injection today. Patient tolerated the procedure well with some resolution of pain. We discussed continuing the home exercises. We discussed possible restarting formal physical therapy which patient declined. The discussed with patient over-the-counter medications that could be beneficial and hopefully start having patient decrease the amount of pain medicines of the long run. Patient was given home exercise program that is somewhat different than when she's been doing previously and will work also on postural changes that I think will be helpful. Patient will come back again in 3 weeks for further evaluation and treatment.

## 2014-03-05 NOTE — Progress Notes (Signed)
[facility] [place], [place] [address] Phone: [phone] Subjective:    I'm seeing this patient by the request  of:  Walker Kehr, MD   CC: Right shoulder pain  [id number] [name] is a 58 y.o. female coming in with complaint of right shoulder pain. Patient does have past medical history significant for an injury in 2001 and has been on Workmen's Compensation since that time. Patient has had actually a arthroscopic procedure done with a rotator cuff repair on the right side and did have a arthroscopic surgery of the left side 3 years after the initial one. Patient continued to have pain in his seen a pain management doctor including taking 60 mg of extended release morphine, oxycodone for breakthrough pain. Patient states that the pain is not improving. Patient states that her orthopedic surgeon like her to have another surgery but this was denied by her Worker's Compensation. Patient has had cervical x-rays which were reviewed by me today. Cervical x-rays which show some mild osteoarthritic changes. Patient states that she continues to have pain at all times. States that it is chronic pain that is worse with overhead activity or reaching out across her body. Patient describes it as a sharp pain in does associate some mild weakness even though this is her dominant arm. We discussed that it can also wake her up at night. Patient feels that other doctors are not listening to her in does keep increasing her pain medicine which she would like to not do. States that the pain is 8/10 in severity at all times and can be 10 out of 10 with certain movements.     Past medical history, social, surgical and family history all reviewed in electronic medical record.   Review of Systems: No headache, visual changes, nausea, vomiting, diarrhea, constipation, dizziness, abdominal pain, skin rash, fevers, chills, night sweats, weight loss, swollen lymph nodes,  body aches, joint swelling, muscle aches, chest pain, shortness of breath, mood changes.   Objective Blood pressure 130/82, pulse 81, height 5' 4.5" (1.638 m), weight 165 lb (74.844 kg), SpO2 95.00%.  General: No apparent distress alert and oriented x3 mood and affect normal, dressed appropriately.  HEENT: Pupils equal, extraocular movements intact  Respiratory: Patient's speak in full sentences and does not appear short of breath  Cardiovascular: No lower extremity edema, non tender, no erythema  Skin: Warm dry intact with no signs of infection or rash on extremities or on axial skeleton.  Abdomen: Soft nontender  Neuro: Cranial nerves II through XII are intact, neurovascularly intact in all extremities with 2+ DTRs and 2+ pulses.  Lymph: No lymphadenopathy of posterior or anterior cervical chain or axillae bilaterally.  Gait normal with good balance and coordination.  MSK:  Non tender with full range of motion and good stability and symmetric strength and tone of  elbows, wrist, hip, knee and ankles bilaterally.  Shoulder: Right Inspection reveals no abnormalities, atrophy or asymmetry. Palpation is normal with no tenderness over AC joint or bicipital groove. ROM is full in all planes except for mild limitation with internal rotation of 10 compared to the contralateral side. Rotator cuff strength is 4/5 compared to 5 out of 5 of the contralateral side signs of impingement with positive Neer and Hawkin's tests, but negative empty can sign. Speeds and Yergason's tests normal. No labral pathology noted with negative Obrien's, negative clunk and good stability. Normal scapular function observed. No painful arc and no drop arm  sign. No apprehension sign Contralateral shoulder unremarkable.  MSK US performed of: Right This study was ordered, performed, and interpreted by Charlann Boxer D.O.  Shoulder:   Supraspinatus:  Appears normal on long and transverse views, patient's repair is intact  Bursal bulge seen with shoulder abduction on impingement view. Infraspinatus:  Appears normal on long and transverse views. Significant increase in Doppler flow Subscapularis:  Appears normal on long and transverse views. Positive bursa Teres Minor:  Appears normal on long and transverse views. AC joint:  Capsule undistended, no geyser sign. Glenohumeral Joint:  Appears normal without effusion. Glenoid Labrum: Postsurgical changes noted Biceps Tendon:  Appears normal on long and transverse views, no fraying of tendon, tendon located in intertubercular groove, no subluxation with shoulder internal or external rotation.  Impression: Subacromial bursitis with postsurgical changes of the rotator cuff and labrum noted  Procedure: Real-time Ultrasound Guided Injection of right glenohumeral joint Device: GE Logiq E  Ultrasound guided injection is preferred based studies that show increased duration, increased effect, greater accuracy, decreased procedural pain, increased response rate with ultrasound guided versus blind injection.  Verbal informed consent obtained.  Time-out conducted.  Noted no overlying erythema, induration, or other signs of local infection.  Skin prepped in a sterile fashion.  Local anesthesia: Topical Ethyl chloride.  With sterile technique and under real time ultrasound guidance:  Joint visualized.  23g 1  inch needle inserted posterior approach. Pictures taken for needle placement. Patient did have injection of 2 cc of 1% lidocaine, 2 cc of 0.5% Marcaine, and 1.0 cc of Kenalog 40 mg/dL. Completed without difficulty  Pain immediately resolved suggesting accurate placement of the medication.  Advised to call if fevers/chills, erythema, induration, drainage, or persistent bleeding.  Images permanently stored and available for review in the ultrasound unit.  Impression: Technically successful ultrasound guided injection.     Impression and Recommendations:     This case  required medical decision making of moderate complexity.

## 2014-03-06 ENCOUNTER — Encounter: Payer: Self-pay | Admitting: Internal Medicine

## 2014-03-08 ENCOUNTER — Ambulatory Visit: Payer: Self-pay | Admitting: Internal Medicine

## 2014-03-13 NOTE — Telephone Encounter (Signed)
I called patient back today and spoke with her about scheduling her C&B appointment. I explained this is very important and the front desk will call her to have this scheduled.

## 2014-03-13 NOTE — Telephone Encounter (Signed)
C&B on 04/11/14

## 2014-03-26 ENCOUNTER — Ambulatory Visit (INDEPENDENT_AMBULATORY_CARE_PROVIDER_SITE_OTHER): Payer: Worker's Compensation | Admitting: Family Medicine

## 2014-03-26 ENCOUNTER — Encounter: Payer: Self-pay | Admitting: Family Medicine

## 2014-03-26 ENCOUNTER — Ambulatory Visit (INDEPENDENT_AMBULATORY_CARE_PROVIDER_SITE_OTHER)
Admission: RE | Admit: 2014-03-26 | Discharge: 2014-03-26 | Disposition: A | Discharge status: Home / Self Care | Payer: Worker's Compensation | Source: Ambulatory Visit | Attending: Family Medicine | Admitting: Family Medicine

## 2014-03-26 VITALS — BP 130/86 | HR 98 | Ht 64.5 in | Wt 162.0 lb

## 2014-03-26 DIAGNOSIS — M67919 Unspecified disorder of synovium and tendon, unspecified shoulder: Secondary | ICD-10-CM | POA: Diagnosis not present

## 2014-03-26 DIAGNOSIS — M25511 Pain in right shoulder: Secondary | ICD-10-CM

## 2014-03-26 DIAGNOSIS — M7551 Bursitis of right shoulder: Secondary | ICD-10-CM

## 2014-03-26 DIAGNOSIS — M719 Bursopathy, unspecified: Secondary | ICD-10-CM

## 2014-03-26 DIAGNOSIS — M25519 Pain in unspecified shoulder: Secondary | ICD-10-CM | POA: Diagnosis not present

## 2014-03-26 NOTE — Assessment & Plan Note (Addendum)
Patient today did have some new findings with possible labral pathology. I do believe that further evaluation for a labral tear in the shoulder could be beneficial to see. Today we will order an MR arthrogram for further evaluation. X-rays were ordered today as well. Patient has had surgery before and we need to make sure the patient does not have a recurrent tear. This is difficult to treat secondary to patient being on chronic narcotics. We discussed the possibility of formal physical therapy and patient states that [employer] would not cover this. We may consider trying in the long run. Patient will come back to one to 2 days after the MRI and we'll further evaluate and discuss treatment options. In the interim patient will continue with the home exercises and icing protocol. Trial of topical anti-inflammatories given.  Spent greater than 25 minutes with patient face-to-face and had greater than 50% of counseling including as described above in assessment and plan.

## 2014-03-26 NOTE — Patient Instructions (Signed)
Good to see you I am sorry you are not better Lets get an MRI with dye to look at the labrum tear.  Pennsaid twice daily can help.  If you like it call me and I will get you a prescription.  Continue the ice and the exercises Come back 1-2 days after MRI and we will discuss next step.

## 2014-03-26 NOTE — Progress Notes (Signed)
  [facility] [address], [place] [address] Phone: [phone] Subjective:    I'm seeing this patient by the request  of:  Walker Kehr, MD   CC: Right shoulder pain  [id number] [place] [facility] is a 58 y.o. female coming in with complaint of right shoulder pain. Was given injection previously, HEP, as well as icing. Patient states that she made some improvement with a dull aching pain but continues to have sharp pain with certain movements. Patient states her regular daily activities including doing hair as well as lifting continues to be severely painful. Patient continues all her narcotics on a regular basis. Patient states that the pain is still waking her up at night.   Patient does have past medical history significant for an injury in 2001 and has been on Workmen's Compensation since that time. Patient has had actually a arthroscopic procedure done with a rotator cuff repair on the right side and did have a arthroscopic surgery of the left side 3 years after the initial one. Patient continued to have pain in his seen a pain management doctor including taking 60 mg of extended release morphine, oxycodone for breakthrough pain.    Past medical history, social, surgical and family history all reviewed in electronic medical record.   Review of Systems: No headache, visual changes, nausea, vomiting, diarrhea, constipation, dizziness, abdominal pain, skin rash, fevers, chills, night sweats, weight loss, swollen lymph nodes, body aches, joint swelling, muscle aches, chest pain, shortness of breath, mood changes.   Objective Blood pressure 130/86, pulse 98, height 5' 4.5" (1.638 m), weight 162 lb (73.483 kg).  General: No apparent distress alert and oriented x3 mood and affect normal, dressed appropriately.  HEENT: Pupils equal, extraocular movements intact  Respiratory: Patient's speak in full sentences and does not appear short of breath    Cardiovascular: No lower extremity edema, non tender, no erythema  Skin: Warm dry intact with no signs of infection or rash on extremities or on axial skeleton.  Abdomen: Soft nontender  Neuro: Cranial nerves II through XII are intact, neurovascularly intact in all extremities with 2+ DTRs and 2+ pulses.  Lymph: No lymphadenopathy of posterior or anterior cervical chain or axillae bilaterally.  Gait normal with good balance and coordination.  MSK:  Non tender with full range of motion and good stability and symmetric strength and tone of  elbows, wrist, hip, knee and ankles bilaterally.  Shoulder: Right Inspection reveals no abnormalities, atrophy or asymmetry. Palpation is normal with no tenderness over AC joint or bicipital groove. ROM is full in all planes except for mild limitation with internal rotation of 10 compared to the contralateral side. Rotator cuff strength is 4/5 compared to 5 out of 5 of the contralateral side signs of impingement with positive Neer and Hawkin's tests, but negative empty can sign. Speeds and Yergason's tests normal. Positive labral pathology today with positive O'Brien Normal scapular function observed. No painful arc and no drop arm sign. No apprehension sign Contralateral shoulder unremarkable.        Impression and Recommendations:     This case required medical decision making of moderate complexity.

## 2014-03-28 ENCOUNTER — Telehealth: Payer: Self-pay | Admitting: Internal Medicine

## 2014-03-28 NOTE — Telephone Encounter (Signed)
Pt called request our help of explaining and fixing the appt filing when she saw Dr. Tamala Julian. Pt stated she spoke with our billing and they told her to talk to our office. Pt saw Dr. Tamala Julian twice and both times it was suppose to be bill under her worker comp, but for some reason she got a bill and the claim was denied (all of the info is in our office for worker comp for bother appt) Please call pt, pt also wants to make sure that the MRI will be under worker comp too.

## 2014-04-11 ENCOUNTER — Other Ambulatory Visit: Payer: Self-pay | Admitting: *Deleted

## 2014-04-11 ENCOUNTER — Ambulatory Visit (INDEPENDENT_AMBULATORY_CARE_PROVIDER_SITE_OTHER): Payer: Federal, State, Local not specified - PPO | Admitting: Gynecology

## 2014-04-11 ENCOUNTER — Encounter: Payer: Self-pay | Admitting: Gynecology

## 2014-04-11 DIAGNOSIS — M25511 Pain in right shoulder: Secondary | ICD-10-CM

## 2014-04-11 DIAGNOSIS — R87810 Cervical high risk human papillomavirus (HPV) DNA test positive: Secondary | ICD-10-CM

## 2014-04-11 NOTE — Patient Instructions (Signed)
Office will call you with the biopsy results 

## 2014-04-11 NOTE — Progress Notes (Signed)
Ivalene Platte [birth year] [id number]        58 y.o.  [id number] presents for colposcopy. History of recent Pap smear showing normal cytology but positive high risk HPV screen and subtype 18/45. She does give a history of an abnormal Pap smear in her 82s that was repeated and normal and the remainder of her Pap smears have always been normal.  Past medical history,surgical history, problem list, medications, allergies, family history and social history were all reviewed and documented in the EPIC chart.  Directed ROS with pertinent positives and negatives documented in the history of present illness/assessment and plan.  Exam: Kim assistant General appearance:  Normal External BUS vagina with atrophic changes. Cervix with atrophic changes.  Colposcopy after acetic acid cleanse inadequate with no TZ seen. No abnormality seen. Cervix was gently dilated with a gradated disposable dilator and an ECC was performed. Physical Exam  Genitourinary:       Assessment/Plan:  58 y.o. [id number] with history as above. Patient will followup for ECC results. Assuming negative will plan repeat Pap smear in one year. I reviewed all issue of HPV and its association with cervical atypia/cancer. The need for continued surveillance discussed.   Note: This document was prepared with digital dictation and possible smart phrase technology. Any transcriptional errors that result from this process are unintentional.   Anastasio Auerbach MD, 10:00 AM 04/11/2014

## 2014-04-14 ENCOUNTER — Inpatient Hospital Stay
Admission: RE | Admit: 2014-04-14 | Discharge: 2014-04-14 | Disposition: A | Discharge status: Home / Self Care | Payer: Self-pay | Source: Ambulatory Visit | Attending: Family Medicine | Admitting: Family Medicine

## 2014-04-16 ENCOUNTER — Encounter: Payer: Self-pay | Admitting: Gynecology

## 2014-04-29 ENCOUNTER — Inpatient Hospital Stay: Admission: RE | Admit: 2014-04-29 | Payer: Self-pay | Source: Ambulatory Visit

## 2014-05-03 ENCOUNTER — Ambulatory Visit: Payer: Self-pay | Admitting: Internal Medicine

## 2014-05-06 ENCOUNTER — Ambulatory Visit: Payer: Self-pay | Admitting: Internal Medicine

## 2014-05-06 ENCOUNTER — Ambulatory Visit
Admission: RE | Admit: 2014-05-06 | Discharge: 2014-05-06 | Disposition: A | Discharge status: Home / Self Care | Payer: Worker's Compensation | Source: Ambulatory Visit | Attending: Family Medicine | Admitting: Family Medicine

## 2014-05-06 DIAGNOSIS — M25511 Pain in right shoulder: Secondary | ICD-10-CM

## 2014-05-06 DIAGNOSIS — Z0289 Encounter for other administrative examinations: Secondary | ICD-10-CM

## 2014-05-06 MED ORDER — IOHEXOL 180 MG/ML  SOLN
1.0000 mL | Freq: Once | INTRAMUSCULAR | Status: AC | PRN
  Administered 2014-05-06: 6 mL via EPIDURAL

## 2014-05-08 ENCOUNTER — Ambulatory Visit (INDEPENDENT_AMBULATORY_CARE_PROVIDER_SITE_OTHER): Payer: Worker's Compensation | Admitting: Family Medicine

## 2014-05-08 ENCOUNTER — Encounter: Payer: Self-pay | Admitting: Family Medicine

## 2014-05-08 VITALS — BP 140/82 | HR 89 | Ht 64.0 in | Wt 168.0 lb

## 2014-05-08 DIAGNOSIS — M5030 Other cervical disc degeneration, unspecified cervical region: Secondary | ICD-10-CM | POA: Insufficient documentation

## 2014-05-08 DIAGNOSIS — M67919 Unspecified disorder of synovium and tendon, unspecified shoulder: Secondary | ICD-10-CM | POA: Diagnosis not present

## 2014-05-08 DIAGNOSIS — M7551 Bursitis of right shoulder: Secondary | ICD-10-CM

## 2014-05-08 DIAGNOSIS — M719 Bursopathy, unspecified: Secondary | ICD-10-CM

## 2014-05-08 MED ORDER — KETOPROFEN 10 % CREA: TOPICAL_CREAM | Status: DC

## 2014-05-08 MED ORDER — GABAPENTIN 100 MG PO CAPS: 100.0000 mg | ORAL_CAPSULE | Freq: Three times a day (TID) | ORAL | Status: DC

## 2014-05-08 NOTE — Patient Instructions (Addendum)
Good to see you [facility] [facility] is your friend Your shoulder is normal. Gabapentin 100mg  at night for next week then 200mg  at night.  Exercises 3 times a week.  Ketoprofen cream is at [facility].  Come back in 3 weeks and we will see how you are doing.

## 2014-05-08 NOTE — Progress Notes (Signed)
[facility] [address] [place], [place] [address] Phone: [phone] Subjective:    I'm seeing this patient by the request  of:  Walker Kehr, MD   CC: Right shoulder pain followup  POE:[id number] Karina Newman is a 58 y.o. female coming in with complaint of right shoulder pain. Was given injection previously, HEP, as well as icing. Patient continued to have pain so she was sent for an MRI to evaluate for potential labral pathology.  Patient's MRI was reviewed by me. Patient's MRI shows no labral tear or any internal derangement. Very mild rotator cuff tendinopathy noted.    Patient does have past medical history significant for an injury in 2001 and has been on Workmen's Compensation since that time. Patient has had actually a arthroscopic procedure done with a rotator cuff repair on the right side and did have a arthroscopic surgery of the left side 3 years after the initial one. Patient continued to have pain in his seen a pain management doctor including taking 60 mg of extended release morphine, oxycodone for breakthrough pain. Patient continues with his pain medications. Continues with a topical anti-inflammatory as well.  Reviewing patient's chart patient does have cervical neck x-ray showing moderate degenerative changes especially with right foraminal narrowing at the C4-C5 level. Patient states that she has had epidurals on this previously. Patient states last injection that was probably in 2014. Patient states that she feels her neck pain is different than her shoulder pain is adamant that she does not think that there associated. Patient states that the third epidural steroid injection she had previously was significantly uncomfortable.    Past medical history, social, surgical and family history all reviewed in electronic medical record.   Review of Systems: No headache, visual changes, nausea, vomiting, diarrhea, constipation, dizziness,  abdominal pain, skin rash, fevers, chills, night sweats, weight loss, swollen lymph nodes, body aches, joint swelling, muscle aches, chest pain, shortness of breath, mood changes.   Objective Blood pressure 140/82, pulse 89, height 5\' 4"  (1.626 m), weight 168 lb (76.204 kg), SpO2 98.00%.  General: No apparent distress alert and oriented x3 mood and affect normal, dressed appropriately.  HEENT: Pupils equal, extraocular movements intact  Respiratory: Patient's speak in full sentences and does not appear short of breath  Cardiovascular: No lower extremity edema, non tender, no erythema  Skin: Warm dry intact with no signs of infection or rash on extremities or on axial skeleton.  Abdomen: Soft nontender  Neuro: Cranial nerves II through XII are intact, neurovascularly intact in all extremities with 2+ DTRs and 2+ pulses.  Lymph: No lymphadenopathy of posterior or anterior cervical chain or axillae bilaterally.  Gait normal with good balance and coordination.  MSK:  Non tender with full range of motion and good stability and symmetric strength and tone of  elbows, wrist, hip, knee and ankles bilaterally.  Shoulder: Right Inspection reveals no abnormalities, atrophy or asymmetry. Palpation is normal with no tenderness over AC joint or bicipital groove. ROM is full in all planes except for mild limitation with internal rotation of 10 compared to the contralateral side. Rotator cuff strength is 4/5 compared to 5 out of 5 of the contralateral side signs of impingement with positive Neer and Hawkin's tests, but negative empty can sign. Speeds and Yergason's tests normal. Positive labral pathology today with positive O'Brien Normal scapular function observed. No painful arc and no drop arm sign. No apprehension sign Significant change from previous exam. Contralateral shoulder  unremarkable.  Neck: Inspection unremarkable. No palpable stepoffs. Negative Spurling's maneuver. I limitation we'll  the last 5 of extension as well as 5 his sidebending bilaterally. Grip strength and sensation normal in bilateral hands Strength good C4 to T1 distribution No sensory change to C4 to T1 Negative Hoffman sign bilaterally Reflexes normal      Impression and Recommendations:     This case required medical decision making of moderate complexity.

## 2014-05-08 NOTE — Assessment & Plan Note (Signed)
I do believe the patient is having more of a cervical radiculopathy giving her shoulder pain. We discussed different treatment options at this time and patient was adamant she feels that this is more of her shoulder. I do not feel that her shoulder is incapacitating at this time and we need to evaluate and make further she continues to have this discomfort. Patient has elected to start on gabapentin to see if this neuromodulator could be beneficial. Patient was given a new exercise handout and once again declined formal physical therapy because she feels that the Workmen's Compensation would not cover it. Patient was given a refill of topical anti-inflammatory which I think would be helpful. Patient will try titrating up on the gabapentin and then come back and see me in 3 weeks for further evaluation. Continuing to have discomfort will need to consider an MRI of the cervical spine to see if there has been any progression from her previous MRI in 2011. Discussed with patient though that she is unwilling to do an epidural or any surgical intervention an MRI would likely not be useful. Patient states understanding.  Spent greater than 25 minutes with patient face-to-face and had greater than 50% of counseling including as described above in assessment and plan.

## 2014-05-08 NOTE — Assessment & Plan Note (Signed)
Completely resolved with mild tendinopathy on MRI. I do not think that any further imaging is necessary. I do not think any further care for the shoulder pain and I think it is more of a referred pain from her neck. Patient is fairly adamant she does not feel that this is true but will try to different treatment options as listed with the cervical degenerative disc disease. Patient encouraged to continue the exercises and work on the postural changes. The patient continues to have pain we may need to consider a chronic pain syndrome or reflex sympathetic dystrophy as possible causes of her discomfort.

## 2014-06-03 ENCOUNTER — Ambulatory Visit (INDEPENDENT_AMBULATORY_CARE_PROVIDER_SITE_OTHER): Payer: Worker's Compensation | Admitting: Family Medicine

## 2014-06-03 ENCOUNTER — Encounter: Payer: Self-pay | Admitting: Family Medicine

## 2014-06-03 VITALS — BP 136/84 | HR 107 | Ht 64.0 in | Wt 168.0 lb

## 2014-06-03 DIAGNOSIS — M5030 Other cervical disc degeneration, unspecified cervical region: Secondary | ICD-10-CM | POA: Diagnosis not present

## 2014-06-03 NOTE — Progress Notes (Signed)
[facility] [address] [place], [place] [address] Phone: [phone] Subjective:      CC: Right shoulder pain and neck pain followup  IOX:[id number] Karina Newman is a 58 y.o. female coming in with complaint of right shoulder pain.   Patient's MRI was reviewed by me. Patient's MRI shows no labral tear or any internal derangement. Very mild rotator cuff tendinopathy noted. Can continue to have significant amount of pain and I think this is secondary to her neck. Patient is adamant she feels that this is from her shoulder. Patient states that she has had history of neck problems previously. Patient states that she was told that she didn't need surgery but elected not to have it. Patient states that she had an MRI of her cervical spine in 2011. Reviewing patient's chart patient does have cervical neck x-ray showing moderate degenerative changes especially with right foraminal narrowing at the C4-C5 level. Patient states that she has had epidurals on this previously. Patient states last injection that was probably in 2014. Patient states that she feels her neck pain is different than her shoulder pain is adamant that she does not think that there associated and continues to have a dull throbbing neck pain as well as her shoulder pain.   .  Past medical history  Patient does have past medical history significant for an injury in 2001 and has been on Workmen's Compensation since that time. Patient has had actually a arthroscopic procedure done with a rotator cuff repair on the right side and did have a arthroscopic surgery of the left side 3 years after the initial one. Patient continued to have pain in his seen a pain management doctor including taking 60 mg of extended release morphine, oxycodone for breakthrough pain. Patient continues with his pain medications.   Continues with a topical anti-inflammatory as well. Past medical history, social, surgical and family  history all reviewed in electronic medical record.   Review of Systems: No headache, visual changes, nausea, vomiting, diarrhea, constipation, dizziness, abdominal pain, skin rash, fevers, chills, night sweats, weight loss, swollen lymph nodes, body aches, joint swelling, muscle aches, chest pain, shortness of breath, mood changes.   Objective Blood pressure 136/84, pulse 107, height 5\' 4"  (1.626 m), weight 168 lb (76.204 kg), SpO2 98.00%.  General: No apparent distress alert and oriented x3 mood and affect normal, dressed appropriately.  HEENT: Pupils equal, extraocular movements intact  Respiratory: Patient's speak in full sentences and does not appear short of breath  Cardiovascular: No lower extremity edema, non tender, no erythema  Skin: Warm dry intact with no signs of infection or rash on extremities or on axial skeleton.  Abdomen: Soft nontender  Neuro: Cranial nerves II through XII are intact, neurovascularly intact in all extremities with 2+ DTRs and 2+ pulses.  Lymph: No lymphadenopathy of posterior or anterior cervical chain or axillae bilaterally.  Gait normal with good balance and coordination.  MSK:  Non tender with full range of motion and good stability and symmetric strength and tone of  elbows, wrist, hip, knee and ankles bilaterally.  Shoulder: Right Inspection reveals no abnormalities, atrophy or asymmetry. Palpation is normal with no tenderness over AC joint or bicipital groove. ROM is full in all planes except for mild limitation with internal rotation of 10 compared to the contralateral side. Rotator cuff strength is 5/5 compared to 5 out of 5 of the contralateral side signs of impingement with positive Neer and Hawkin's tests, but negative  empty can sign. Speeds and Yergason's tests normal. Positive labral pathology today with positive O'Brien Normal scapular function observed. No painful arc and no drop arm sign. No apprehension sign Significant change from  previous exam. Contralateral shoulder unremarkable.  Neck: Inspection unremarkable. No palpable stepoffs. Negative Spurling's maneuver. I limitation we'll the last 5 of extension as well as 5 his sidebending bilaterally. Grip strength and sensation normal in bilateral hands Strength good C4 to T1 distribution No sensory change to C4 to T1 Negative Hoffman sign bilaterally Reflexes normal      Impression and Recommendations:     This case required medical decision making of moderate complexity.

## 2014-06-03 NOTE — Patient Instructions (Signed)
Good to see you Continue the exercises and the medicines.  Ice is your friend.  I am out of options at this time.  Would consider you talking to a surgeon if you decide to know your options.  Otherwise all I have to do would be to do MRi of your neck and maybe epidurals.

## 2014-06-03 NOTE — Assessment & Plan Note (Signed)
Discussed with patient for greater than 45 minutes. We discussed at length. I discussed with her that in her shoulder there is no significant pathology that would be contributing to her pain at this time. I do not feel that anything else is causing this other than cervical radiculopathy. We discussed further workup including a repeat MRI and possible epidural steroid injections. Patient states that she would not do an epidural steroid injection and would not like to have surgery. Patient states that she would rather live with the pain. I discussed the possibility of more formal physical therapy which patient declined as well. I discussed with patient that at this time there is nothing else I can offer her and she would need to find another provider if she is not willing to do any of my recommendations. Patient states that she will think about it and get back to [place]. I discussed that at this time unless she is willing to do further imaging or physical therapy I would elect for her to find another provider.  Spent greater than 45 minutes with patient face-to-face and had greater than 50% of counseling including as described above in assessment and plan.

## 2014-06-17 ENCOUNTER — Encounter: Payer: Self-pay | Admitting: Internal Medicine

## 2014-06-17 ENCOUNTER — Ambulatory Visit (INDEPENDENT_AMBULATORY_CARE_PROVIDER_SITE_OTHER): Payer: Worker's Compensation | Admitting: Internal Medicine

## 2014-06-17 VITALS — BP 140/80 | HR 86 | Temp 98.5°F | Wt 168.0 lb

## 2014-06-17 DIAGNOSIS — M5030 Other cervical disc degeneration, unspecified cervical region: Secondary | ICD-10-CM

## 2014-06-17 DIAGNOSIS — M25511 Pain in right shoulder: Secondary | ICD-10-CM

## 2014-06-17 DIAGNOSIS — M25512 Pain in left shoulder: Secondary | ICD-10-CM | POA: Diagnosis not present

## 2014-06-17 MED ORDER — KETOPROFEN 10 % CREA: TOPICAL_CREAM | Status: DC

## 2014-06-17 MED ORDER — OXYCODONE HCL 15 MG PO TABS: 15.0000 mg | ORAL_TABLET | Freq: Three times a day (TID) | ORAL | Status: DC | PRN

## 2014-06-17 MED ORDER — OXYCODONE HCL 15 MG PO TABS: 15.0000 mg | ORAL_TABLET | Freq: Two times a day (BID) | ORAL | Status: DC | PRN

## 2014-06-17 MED ORDER — MORPHINE SULFATE ER BEADS 60 MG PO CP24: 60.0000 mg | ORAL_CAPSULE | Freq: Every day | ORAL | Status: DC

## 2014-06-17 NOTE — Progress Notes (Signed)
   Subjective:    HPI   The patient presents  For a f/up on chronic hypertension, chronic hyperthyroidism. F/u chronic R shoulder pain controlled w/opioids controlled with medicines - much worse lately. The pain is down R arm, hand. F/u muscle spasms in the R chest and R arm.  F/u knee pain B   She was recently told that her vit D is low.  Wt Readings from Last 3 Encounters:  06/17/14 168 lb (76.204 kg)  06/03/14 168 lb (76.204 kg)  05/08/14 168 lb (76.204 kg)   BP Readings from Last 3 Encounters:  06/17/14 140/80  06/03/14 136/84  05/08/14 140/82       Review of Systems  Constitutional: Negative for chills, activity change, appetite change, fatigue and unexpected weight change.  HENT: Negative for mouth sores and sinus pressure.   Eyes: Negative for visual disturbance.  Respiratory: Negative for chest tightness.   Genitourinary: Negative for frequency, difficulty urinating and vaginal pain.  Musculoskeletal: Positive for back pain (cervical) and arthralgias (R shoulder). Negative for gait problem.  Skin: Negative for pallor.  Neurological: Negative for dizziness, tremors, weakness and numbness.  Psychiatric/Behavioral: Negative for suicidal ideas, confusion, sleep disturbance and dysphoric mood. The patient is not nervous/anxious.    B shoulders are tender w/ROM and stiff; B traps are firm and tender    Objective:   Physical Exam   B patellas are tender  Lab Results  Component Value Date   WBC 5.3 07/03/2013   HGB 13.4 07/03/2013   HCT 39.1 07/03/2013   PLT 360.0 07/03/2013   GLUCOSE 76 03/01/2014   CHOL 152 07/03/2013   TRIG 121.0 07/03/2013   HDL 39.60 07/03/2013   LDLCALC 88 07/03/2013   ALT 27 07/03/2013   AST 23 07/03/2013   NA 140 03/01/2014   K 3.8 03/01/2014   CL 100 03/01/2014   CREATININE 0.9 03/01/2014   BUN 10 03/01/2014   CO2 32 03/01/2014   TSH 0.07* 03/01/2014     Labs reviewed     Assessment & Plan:

## 2014-06-17 NOTE — Progress Notes (Signed)
Pre visit review using our clinic review tool, if applicable. No additional management support is needed unless otherwise documented below in the visit note. 

## 2014-06-18 NOTE — Assessment & Plan Note (Signed)
Continue with current prescription therapy as reflected on the Med list.  

## 2014-06-19 NOTE — Assessment & Plan Note (Signed)
Chronic R>L  Potential benefits of a long term opioids use as well as potential risks (i.e. addiction risk, apnea etc) and complications (i.e. Somnolence, constipation and others) were explained to the patient and were aknowledged. 

## 2014-07-01 ENCOUNTER — Ambulatory Visit: Payer: Self-pay | Admitting: Internal Medicine

## 2014-07-31 ENCOUNTER — Ambulatory Visit (INDEPENDENT_AMBULATORY_CARE_PROVIDER_SITE_OTHER): Payer: Federal, State, Local not specified - PPO | Admitting: Internal Medicine

## 2014-07-31 ENCOUNTER — Other Ambulatory Visit (INDEPENDENT_AMBULATORY_CARE_PROVIDER_SITE_OTHER): Payer: Federal, State, Local not specified - PPO

## 2014-07-31 ENCOUNTER — Encounter: Payer: Self-pay | Admitting: Internal Medicine

## 2014-07-31 VITALS — BP 130/88 | HR 87 | Temp 98.9°F | Wt 168.0 lb

## 2014-07-31 DIAGNOSIS — S43439S Superior glenoid labrum lesion of unspecified shoulder, sequela: Secondary | ICD-10-CM

## 2014-07-31 DIAGNOSIS — G4700 Insomnia, unspecified: Secondary | ICD-10-CM

## 2014-07-31 DIAGNOSIS — E0590 Thyrotoxicosis, unspecified without thyrotoxic crisis or storm: Secondary | ICD-10-CM

## 2014-07-31 DIAGNOSIS — E559 Vitamin D deficiency, unspecified: Secondary | ICD-10-CM

## 2014-07-31 LAB — T4, FREE: FREE T4: 0.98 ng/dL (ref 0.60–1.60)

## 2014-07-31 LAB — TSH: TSH: 0.92 u[IU]/mL (ref 0.35–4.50)

## 2014-07-31 MED ORDER — MORPHINE SULFATE ER BEADS 60 MG PO CP24: 60.0000 mg | ORAL_CAPSULE | Freq: Every day | ORAL | Status: DC

## 2014-07-31 MED ORDER — OXYCODONE HCL 15 MG PO TABS: 15.0000 mg | ORAL_TABLET | Freq: Two times a day (BID) | ORAL | Status: DC | PRN

## 2014-07-31 MED ORDER — TRAZODONE HCL 100 MG PO TABS: 100.0000 mg | ORAL_TABLET | Freq: Every day | ORAL | Status: DC

## 2014-07-31 NOTE — Assessment & Plan Note (Signed)
L 2001 Chronic pain On Avinza and Oxycodone since 2007 [facility]  Potential benefits of a long term opioids use as well as potential risks (i.e. addiction risk, apnea etc) and complications (i.e. Somnolence, constipation and others) were explained to the patient and were aknowledged. R shoulder pain 2015

## 2014-07-31 NOTE — Assessment & Plan Note (Signed)
Discussed. Denies OSA sx's Start Trazodone

## 2014-07-31 NOTE — Progress Notes (Signed)
Pre visit review using our clinic review tool, if applicable. No additional management support is needed unless otherwise documented below in the visit note.   Subjective:    HPI  C/o bad insomnia x weeks - worse The patient presents  For a f/up on chronic hypertension, chronic hyperthyroidism. F/u chronic R shoulder pain controlled w/opioids controlled with medicines - much worse lately. The pain is down R arm, hand F/u knee pain B   She was recently told that her vit D is low.  Wt Readings from Last 3 Encounters:  07/31/14 168 lb (76.204 kg)  06/17/14 168 lb (76.204 kg)  06/03/14 168 lb (76.204 kg)   BP Readings from Last 3 Encounters:  07/31/14 130/88  06/17/14 140/80  06/03/14 136/84       Review of Systems  Constitutional: Negative for chills, activity change, appetite change, fatigue and unexpected weight change.  HENT: Negative for mouth sores and sinus pressure.   Eyes: Negative for visual disturbance.  Respiratory: Negative for chest tightness.   Genitourinary: Negative for frequency, difficulty urinating and vaginal pain.  Musculoskeletal: Positive for back pain (cervical) and arthralgias (R shoulder). Negative for gait problem.  Skin: Negative for pallor.  Neurological: Negative for dizziness, tremors, weakness and numbness.  Psychiatric/Behavioral: Negative for suicidal ideas, confusion, sleep disturbance and dysphoric mood. The patient is not nervous/anxious.    B shoulders are tender w/ROM and stiff; B traps are firm and tender    Objective:   Physical Exam  Constitutional: She appears well-developed. No distress.  HENT:  Head: Normocephalic.  Right Ear: External ear normal.  Left Ear: External ear normal.  Nose: Nose normal.  Mouth/Throat: Oropharynx is clear and moist.  Eyes: Conjunctivae are normal. Pupils are equal, round, and reactive to light. Right eye exhibits no discharge. Left eye exhibits no discharge.  Neck: Normal range of motion. Neck  supple. No JVD present. No tracheal deviation present. No thyromegaly present.  Cardiovascular: Normal rate, regular rhythm and normal heart sounds.  Pulmonary/Chest: No stridor. No respiratory distress. She has no wheezes.  Abdominal: Soft. Bowel sounds are normal. She exhibits no distension and no mass. There is no tenderness. There is no rebound and no guarding.  Musculoskeletal: She exhibits tenderness (R shoulder is tender w/ROM). She exhibits no edema.  Lymphadenopathy:   She has no cervical adenopathy.  Neurological: She displays normal reflexes. No cranial nerve deficit. She exhibits normal muscle tone. Coordination normal.  Skin: No rash noted. No erythema.  Psychiatric: She has a normal mood and affect. Her behavior is normal. Judgment and thought content normal.  B patellas are tender   Lab Results  Component Value Date   WBC 5.3 07/03/2013   HGB 13.4 07/03/2013   HCT 39.1 07/03/2013   PLT 360.0 07/03/2013   GLUCOSE 76 03/01/2014   CHOL 152 07/03/2013   TRIG 121.0 07/03/2013   HDL 39.60 07/03/2013   LDLCALC 88 07/03/2013   ALT 27 07/03/2013   AST 23 07/03/2013   NA 140 03/01/2014   K 3.8 03/01/2014   CL 100 03/01/2014   CREATININE 0.9 03/01/2014   BUN 10 03/01/2014   CO2 32 03/01/2014   TSH 0.07* 03/01/2014          Assessment & Plan:

## 2014-07-31 NOTE — Assessment & Plan Note (Signed)
Labs

## 2014-07-31 NOTE — Assessment & Plan Note (Signed)
Continue with current prescription therapy as reflected on the Med list.  

## 2014-08-26 ENCOUNTER — Ambulatory Visit (INDEPENDENT_AMBULATORY_CARE_PROVIDER_SITE_OTHER): Payer: Worker's Compensation | Admitting: Internal Medicine

## 2014-08-26 ENCOUNTER — Encounter: Payer: Self-pay | Admitting: Internal Medicine

## 2014-08-26 VITALS — BP 120/80 | HR 82 | Temp 98.3°F | Wt 167.0 lb

## 2014-08-26 DIAGNOSIS — E559 Vitamin D deficiency, unspecified: Secondary | ICD-10-CM

## 2014-08-26 DIAGNOSIS — M25511 Pain in right shoulder: Secondary | ICD-10-CM | POA: Diagnosis not present

## 2014-08-26 DIAGNOSIS — I119 Hypertensive heart disease without heart failure: Secondary | ICD-10-CM

## 2014-08-26 DIAGNOSIS — M25512 Pain in left shoulder: Secondary | ICD-10-CM

## 2014-08-26 MED ORDER — KETOPROFEN 10 % CREA: TOPICAL_CREAM | Status: DC

## 2014-08-26 MED ORDER — OLMESARTAN-AMLODIPINE-HCTZ 20-5-12.5 MG PO TABS: 1.0000 | ORAL_TABLET | Freq: Every day | ORAL | Status: DC

## 2014-08-26 MED ORDER — MORPHINE SULFATE ER BEADS 60 MG PO CP24: 60.0000 mg | ORAL_CAPSULE | Freq: Every day | ORAL | Status: DC

## 2014-08-26 MED ORDER — OXYCODONE HCL 15 MG PO TABS: 15.0000 mg | ORAL_TABLET | Freq: Two times a day (BID) | ORAL | Status: DC | PRN

## 2014-08-26 NOTE — Progress Notes (Signed)
   Subjective:    HPI   The patient presents  For a f/up on chronic hypertension, chronic hyperthyroidism. F/u chronic R shoulder pain controlled w/opioids controlled with medicines - much worse lately. The pain is down R arm, hand. F/u muscle spasms in the R chest and R arm. Pain has been worse over past 2-3 d (flare-ups happened before >4/year)  F/u knee pain B   She was recently told that her vit D is low.  Wt Readings from Last 3 Encounters:  08/26/14 167 lb (75.751 kg)  07/31/14 168 lb (76.204 kg)  06/17/14 168 lb (76.204 kg)   BP Readings from Last 3 Encounters:  08/26/14 120/80  07/31/14 130/88  06/17/14 140/80       Review of Systems  Constitutional: Negative for chills, activity change, appetite change, fatigue and unexpected weight change.  HENT: Negative for mouth sores and sinus pressure.   Eyes: Negative for visual disturbance.  Respiratory: Negative for chest tightness.   Genitourinary: Negative for frequency, difficulty urinating and vaginal pain.  Musculoskeletal: Positive for back pain (cervical) and arthralgias (R shoulder). Negative for gait problem.  Skin: Negative for pallor.  Neurological: Negative for dizziness, tremors, weakness and numbness.  Psychiatric/Behavioral: Negative for suicidal ideas, confusion, sleep disturbance and dysphoric mood. The patient is not nervous/anxious.    B shoulders are tender w/ROM and stiff; B traps are firm and tender    Objective:   Physical Exam  Constitutional: She appears well-developed. No distress.  HENT:  Head: Normocephalic.  Right Ear: External ear normal.  Left Ear: External ear normal.  Nose: Nose normal.  Mouth/Throat: Oropharynx is clear and moist.  Eyes: Conjunctivae are normal. Pupils are equal, round, and reactive to light. Right eye exhibits no discharge. Left eye exhibits no discharge.  Neck: Normal range of motion. Neck supple. No JVD present. No tracheal deviation present. No thyromegaly  present.  Cardiovascular: Normal rate, regular rhythm and normal heart sounds.   Pulmonary/Chest: No stridor. No respiratory distress. She has no wheezes.  Abdominal: Soft. Bowel sounds are normal. She exhibits no distension and no mass. There is no tenderness. There is no rebound and no guarding.  Musculoskeletal: She exhibits tenderness. She exhibits no edema.  Lymphadenopathy:    She has no cervical adenopathy.  Neurological: She displays normal reflexes. No cranial nerve deficit. She exhibits normal muscle tone. Coordination normal.  Skin: No rash noted. No erythema.  Psychiatric: She has a normal mood and affect. Her behavior is normal. Judgment and thought content normal.    R shoulder is painful B patellas are tender  Lab Results  Component Value Date   WBC 5.3 07/03/2013   HGB 13.4 07/03/2013   HCT 39.1 07/03/2013   PLT 360.0 07/03/2013   GLUCOSE 76 03/01/2014   CHOL 152 07/03/2013   TRIG 121.0 07/03/2013   HDL 39.60 07/03/2013   LDLCALC 88 07/03/2013   ALT 27 07/03/2013   AST 23 07/03/2013   NA 140 03/01/2014   K 3.8 03/01/2014   CL 100 03/01/2014   CREATININE 0.9 03/01/2014   BUN 10 03/01/2014   CO2 32 03/01/2014   TSH 0.92 07/31/2014     Labs reviewed     Assessment & Plan:

## 2014-08-26 NOTE — Assessment & Plan Note (Signed)
Continue with current prescription therapy as reflected on the Med list.  

## 2014-08-26 NOTE — Progress Notes (Signed)
Pre visit review using our clinic review tool, if applicable. No additional management support is needed unless otherwise documented below in the visit note. 

## 2014-08-26 NOTE — Assessment & Plan Note (Signed)
S/p recent card w/up in [place]

## 2014-08-26 NOTE — Assessment & Plan Note (Signed)
Chronic R>L  Potential benefits of a long term opioids use as well as potential risks (i.e. addiction risk, apnea etc) and complications (i.e. Somnolence, constipation and others) were explained to the patient and were aknowledged.

## 2014-09-11 ENCOUNTER — Other Ambulatory Visit: Payer: Self-pay | Admitting: Internal Medicine

## 2014-11-25 ENCOUNTER — Encounter: Payer: Self-pay | Admitting: Internal Medicine

## 2014-11-25 ENCOUNTER — Ambulatory Visit (INDEPENDENT_AMBULATORY_CARE_PROVIDER_SITE_OTHER): Admitting: Internal Medicine

## 2014-11-25 VITALS — BP 160/98 | HR 90 | Wt 169.0 lb

## 2014-11-25 DIAGNOSIS — G4700 Insomnia, unspecified: Secondary | ICD-10-CM

## 2014-11-25 DIAGNOSIS — I10 Essential (primary) hypertension: Secondary | ICD-10-CM

## 2014-11-25 DIAGNOSIS — E559 Vitamin D deficiency, unspecified: Secondary | ICD-10-CM | POA: Diagnosis not present

## 2014-11-25 DIAGNOSIS — E0590 Thyrotoxicosis, unspecified without thyrotoxic crisis or storm: Secondary | ICD-10-CM | POA: Diagnosis not present

## 2014-11-25 MED ORDER — ERGOCALCIFEROL 1.25 MG (50000 UT) PO CAPS: 50000.0000 [IU] | ORAL_CAPSULE | ORAL | Status: DC

## 2014-11-25 MED ORDER — KETOPROFEN 10 % CREA: TOPICAL_CREAM | Status: DC

## 2014-11-25 MED ORDER — MORPHINE SULFATE ER BEADS 60 MG PO CP24: 60.0000 mg | ORAL_CAPSULE | Freq: Every day | ORAL | Status: DC

## 2014-11-25 MED ORDER — OXYCODONE HCL 15 MG PO TABS: 15.0000 mg | ORAL_TABLET | Freq: Two times a day (BID) | ORAL | Status: DC | PRN

## 2014-11-25 MED ORDER — OLMESARTAN-AMLODIPINE-HCTZ 40-10-25 MG PO TABS: 1.0000 | ORAL_TABLET | ORAL | Status: DC

## 2014-11-25 NOTE — Assessment & Plan Note (Signed)
12/15 recurrent Husband is snoring bad Try to sleep in a separate bedroom

## 2014-11-25 NOTE — Progress Notes (Signed)
Pre visit review using our clinic review tool, if applicable. No additional management support is needed unless otherwise documented below in the visit note. 

## 2014-11-25 NOTE — Assessment & Plan Note (Signed)
Monitor TSH 

## 2014-11-25 NOTE — Assessment & Plan Note (Signed)
Re-start Vit D Will give a Rx per month Risks associated with treatment noncompliance were discussed. Compliance was encouraged.

## 2014-11-25 NOTE — Progress Notes (Signed)
   Subjective:    HPI   The patient presents  For a f/up on chronic hypertension, chronic hyperthyroidism. F/u chronic R shoulder pain controlled w/opioids controlled with medicines - much worse lately. The pain is down R arm, hand. F/u muscle spasms in the R chest and R arm.  Wt Readings from Last 3 Encounters:  11/25/14 169 lb (76.658 kg)  08/26/14 167 lb (75.751 kg)  07/31/14 168 lb (76.204 kg)   BP Readings from Last 3 Encounters:  11/25/14 160/98  08/26/14 120/80  07/31/14 130/88       Review of Systems  Constitutional: Negative for chills, activity change, appetite change, fatigue and unexpected weight change.  HENT: Negative for mouth sores and sinus pressure.   Eyes: Negative for visual disturbance.  Respiratory: Negative for chest tightness.   Genitourinary: Negative for frequency, difficulty urinating and vaginal pain.  Musculoskeletal: Positive for back pain (cervical) and arthralgias (R shoulder). Negative for gait problem.  Skin: Negative for pallor.  Neurological: Negative for dizziness, tremors, weakness and numbness.  Psychiatric/Behavioral: Negative for suicidal ideas, confusion, sleep disturbance and dysphoric mood. The patient is not nervous/anxious.    B shoulders are tender w/ROM and stiff; B traps are firm and tender    Objective:   Physical Exam  Constitutional: She appears well-developed. No distress.  HENT:  Head: Normocephalic.  Right Ear: External ear normal.  Left Ear: External ear normal.  Nose: Nose normal.  Mouth/Throat: Oropharynx is clear and moist.  Eyes: Conjunctivae are normal. Pupils are equal, round, and reactive to light. Right eye exhibits no discharge. Left eye exhibits no discharge.  Neck: Normal range of motion. Neck supple. No JVD present. No tracheal deviation present. No thyromegaly present.  Cardiovascular: Normal rate, regular rhythm and normal heart sounds.   Pulmonary/Chest: No stridor. No respiratory distress. She has  no wheezes.  Abdominal: Soft. Bowel sounds are normal. She exhibits no distension and no mass. There is no tenderness. There is no rebound and no guarding.  Musculoskeletal: She exhibits tenderness. She exhibits no edema.  Lymphadenopathy:    She has no cervical adenopathy.  Neurological: She displays normal reflexes. No cranial nerve deficit. She exhibits normal muscle tone. Coordination normal.  Skin: No rash noted. No erythema.  Psychiatric: She has a normal mood and affect. Her behavior is normal. Judgment and thought content normal.    R shoulder is painful B patellas are tender  Lab Results  Component Value Date   WBC 5.3 07/03/2013   HGB 13.4 07/03/2013   HCT 39.1 07/03/2013   PLT 360.0 07/03/2013   GLUCOSE 76 03/01/2014   CHOL 152 07/03/2013   TRIG 121.0 07/03/2013   HDL 39.60 07/03/2013   LDLCALC 88 07/03/2013   ALT 27 07/03/2013   AST 23 07/03/2013   NA 140 03/01/2014   K 3.8 03/01/2014   CL 100 03/01/2014   CREATININE 0.9 03/01/2014   BUN 10 03/01/2014   CO2 32 03/01/2014   TSH 0.92 07/31/2014     Labs reviewed     Assessment & Plan:

## 2014-11-25 NOTE — Patient Instructions (Addendum)
Try hot "gentle stretching" yoga - [facility] Regular gentle yoga - [facility]

## 2014-11-25 NOTE — Assessment & Plan Note (Signed)
Tribenzor - dose increased

## 2014-12-11 ENCOUNTER — Ambulatory Visit (INDEPENDENT_AMBULATORY_CARE_PROVIDER_SITE_OTHER): Admitting: Internal Medicine

## 2014-12-11 ENCOUNTER — Encounter: Payer: Self-pay | Admitting: Internal Medicine

## 2014-12-11 VITALS — BP 148/100 | HR 95 | Wt 168.0 lb

## 2014-12-11 DIAGNOSIS — M5010 Cervical disc disorder with radiculopathy, unspecified cervical region: Secondary | ICD-10-CM | POA: Insufficient documentation

## 2014-12-11 MED ORDER — PREDNISONE 10 MG PO TABS: ORAL_TABLET | ORAL | Status: DC

## 2014-12-11 NOTE — Progress Notes (Signed)
Pre visit review using our clinic review tool, if applicable. No additional management support is needed unless otherwise documented below in the visit note. 

## 2014-12-11 NOTE — Assessment & Plan Note (Signed)
4/16 R Prednisone 10 mg: take 4 tabs a day x 3 days; then 3 tabs a day x 4 days; then 2 tabs a day x 4 days, then 1 tab a day x 6 days, then stop. Take pc. C spine MRI RTC 2 wks

## 2014-12-11 NOTE — Progress Notes (Signed)
   Subjective:    HPI   The patient presents for a f/up on chronic hypertension, chronic hyperthyroidism.   F/u chronic R shoulder pain controlled w/opioids - much worse lately. C/o severe pain in the R neck with irrad  pain down R arm, R hand. C/o tingling, numbness in R thumb, hand  F/u muscle spasms in the R chest and R arm - not new.  Wt Readings from Last 3 Encounters:  12/11/14 168 lb (76.204 kg)  11/25/14 169 lb (76.658 kg)  08/26/14 167 lb (75.751 kg)   BP Readings from Last 3 Encounters:  12/11/14 148/100  11/25/14 160/98  08/26/14 120/80       Review of Systems  Constitutional: Negative for chills, activity change, appetite change, fatigue and unexpected weight change.  HENT: Negative for mouth sores and sinus pressure.   Eyes: Negative for visual disturbance.  Respiratory: Negative for chest tightness.   Genitourinary: Negative for frequency, difficulty urinating and vaginal pain.  Musculoskeletal: Positive for back pain (cervical) and arthralgias (R shoulder). Negative for gait problem.  Skin: Negative for pallor.  Neurological: Negative for dizziness, tremors, weakness and numbness.  Psychiatric/Behavioral: Negative for suicidal ideas, confusion, sleep disturbance and dysphoric mood. The patient is not nervous/anxious.    B shoulders are tender w/ROM and stiff; B traps are firm and tender    Objective:   Physical Exam  Constitutional: She appears well-developed. No distress.  HENT:  Head: Normocephalic.  Right Ear: External ear normal.  Left Ear: External ear normal.  Nose: Nose normal.  Mouth/Throat: Oropharynx is clear and moist.  Eyes: Conjunctivae are normal. Pupils are equal, round, and reactive to light. Right eye exhibits no discharge. Left eye exhibits no discharge.  Neck: Normal range of motion. Neck supple. No JVD present. No tracheal deviation present. No thyromegaly present.  Cardiovascular: Normal rate, regular rhythm and normal heart  sounds.   Pulmonary/Chest: No stridor. No respiratory distress. She has no wheezes.  Abdominal: Soft. Bowel sounds are normal. She exhibits no distension and no mass. There is no tenderness. There is no rebound and no guarding.  Musculoskeletal: She exhibits tenderness. She exhibits no edema.  Lymphadenopathy:    She has no cervical adenopathy.  Neurological: She displays normal reflexes. No cranial nerve deficit. She exhibits normal muscle tone. Coordination normal.  Skin: No rash noted. No erythema.  Psychiatric: She has a normal mood and affect. Her behavior is normal. Judgment and thought content normal.    R shoulder/trap is painful MS is ok   Lab Results  Component Value Date   WBC 5.3 07/03/2013   HGB 13.4 07/03/2013   HCT 39.1 07/03/2013   PLT 360.0 07/03/2013   GLUCOSE 76 03/01/2014   CHOL 152 07/03/2013   TRIG 121.0 07/03/2013   HDL 39.60 07/03/2013   LDLCALC 88 07/03/2013   ALT 27 07/03/2013   AST 23 07/03/2013   NA 140 03/01/2014   K 3.8 03/01/2014   CL 100 03/01/2014   CREATININE 0.9 03/01/2014   BUN 10 03/01/2014   CO2 32 03/01/2014   TSH 0.92 07/31/2014     Labs reviewed     Assessment & Plan:

## 2014-12-16 ENCOUNTER — Encounter: Payer: Self-pay | Admitting: Internal Medicine

## 2014-12-23 ENCOUNTER — Ambulatory Visit
Admission: RE | Admit: 2014-12-23 | Discharge: 2014-12-23 | Disposition: A | Discharge status: Home / Self Care | Source: Ambulatory Visit | Attending: Internal Medicine | Admitting: Internal Medicine

## 2014-12-29 ENCOUNTER — Other Ambulatory Visit: Payer: Self-pay | Admitting: Internal Medicine

## 2014-12-29 DIAGNOSIS — M5010 Cervical disc disorder with radiculopathy, unspecified cervical region: Secondary | ICD-10-CM

## 2014-12-30 ENCOUNTER — Other Ambulatory Visit: Payer: Self-pay

## 2015-01-01 ENCOUNTER — Telehealth: Payer: Self-pay | Admitting: Internal Medicine

## 2015-01-01 NOTE — Telephone Encounter (Signed)
Notified pt with md response.../lmb 

## 2015-01-01 NOTE — Telephone Encounter (Signed)
No need to refill if it did not help. Thx

## 2015-01-01 NOTE — Telephone Encounter (Signed)
Patient states she is suppose to get a refill today on prednisone.  She does not think that it helped much.  She would like to know if she should get refilled.

## 2015-01-03 ENCOUNTER — Encounter: Payer: Self-pay | Admitting: Internal Medicine

## 2015-01-03 ENCOUNTER — Ambulatory Visit (INDEPENDENT_AMBULATORY_CARE_PROVIDER_SITE_OTHER): Admitting: Internal Medicine

## 2015-01-03 VITALS — BP 140/88 | HR 104 | Wt 166.0 lb

## 2015-01-03 DIAGNOSIS — M5010 Cervical disc disorder with radiculopathy, unspecified cervical region: Secondary | ICD-10-CM

## 2015-01-03 MED ORDER — PREGABALIN 50 MG PO CAPS: 50.0000 mg | ORAL_CAPSULE | Freq: Three times a day (TID) | ORAL | Status: DC

## 2015-01-03 NOTE — Progress Notes (Signed)
Pre visit review using our clinic review tool, if applicable. No additional management support is needed unless otherwise documented below in the visit note. 

## 2015-01-03 NOTE — Progress Notes (Signed)
Subjective:    HPI  F/u muscle spasms in the R chest and R arm - worse. Prednisone did not help...  The patient presents for a f/up on chronic hypertension, chronic hyperthyroidism.   F/u chronic R shoulder pain controlled w/opioids - much worse lately. C/o severe pain in the R neck with irrad  pain down R arm, R hand. C/o tingling, numbness in R thumb, hand  Wt Readings from Last 3 Encounters:  01/03/15 166 lb (75.297 kg)  12/11/14 168 lb (76.204 kg)  11/25/14 169 lb (76.658 kg)   BP Readings from Last 3 Encounters:  01/03/15 140/88  12/11/14 148/100  11/25/14 160/98       Review of Systems  Constitutional: Negative for chills, activity change, appetite change, fatigue and unexpected weight change.  HENT: Negative for mouth sores and sinus pressure.   Eyes: Negative for visual disturbance.  Respiratory: Negative for chest tightness.   Genitourinary: Negative for frequency, difficulty urinating and vaginal pain.  Musculoskeletal: Positive for back pain (cervical) and arthralgias (R shoulder). Negative for gait problem.  Skin: Negative for pallor.  Neurological: Negative for dizziness, tremors, weakness and numbness.  Psychiatric/Behavioral: Negative for suicidal ideas, confusion, sleep disturbance and dysphoric mood. The patient is not nervous/anxious.    B shoulders are tender w/ROM and stiff; B traps are firm and tender    Objective:   Physical Exam  Constitutional: She appears well-developed. No distress.  HENT:  Head: Normocephalic.  Right Ear: External ear normal.  Left Ear: External ear normal.  Nose: Nose normal.  Mouth/Throat: Oropharynx is clear and moist.  Eyes: Conjunctivae are normal. Pupils are equal, round, and reactive to light. Right eye exhibits no discharge. Left eye exhibits no discharge.  Neck: Normal range of motion. Neck supple. No JVD present. No tracheal deviation present. No thyromegaly present.  Cardiovascular: Normal rate, regular rhythm  and normal heart sounds.   Pulmonary/Chest: No stridor. No respiratory distress. She has no wheezes.  Abdominal: Soft. Bowel sounds are normal. She exhibits no distension and no mass. There is no tenderness. There is no rebound and no guarding.  Musculoskeletal: She exhibits tenderness. She exhibits no edema.  Lymphadenopathy:    She has no cervical adenopathy.  Neurological: She displays normal reflexes. No cranial nerve deficit. She exhibits normal muscle tone. Coordination normal.  Skin: No rash noted. No erythema.  Psychiatric: She has a normal mood and affect. Her behavior is normal. Judgment and thought content normal.    R shoulder/trap is painful MS is ok   Lab Results  Component Value Date   WBC 5.3 07/03/2013   HGB 13.4 07/03/2013   HCT 39.1 07/03/2013   PLT 360.0 07/03/2013   GLUCOSE 76 03/01/2014   CHOL 152 07/03/2013   TRIG 121.0 07/03/2013   HDL 39.60 07/03/2013   LDLCALC 88 07/03/2013   ALT 27 07/03/2013   AST 23 07/03/2013   NA 140 03/01/2014   K 3.8 03/01/2014   CL 100 03/01/2014   CREATININE 0.9 03/01/2014   BUN 10 03/01/2014   CO2 32 03/01/2014   TSH 0.92 07/31/2014     MRI C-spine 12/23/14 IMPRESSION:  Degenerative spondylosis at C3-4, C4-5 and C5-6 with narrowing of the canal, AP diameter 8 mm throughout that region. No cord compression or abnormal cord signal. Foraminal stenosis bilaterally at those levels that could affect the C4, C5 and C6 nerve roots.   Electronically Signed  By: Nelson Chimes M.D.  On: 12/23/2014 15:19   IMPRESSION R  shoulder MRI 05/06/14:  Rotator cuff tendinopathy without tear.  Negative for labral tear.  Status post acromioplasty and resection of the acromioclavicular joint without complicating feature.   Electronically Signed  By: Inge Rise M.D.  On: 05/06/2014 16:12   Labs reviewed     Assessment & Plan:

## 2015-01-03 NOTE — Assessment & Plan Note (Addendum)
Will ref to Dr Vira Blanco to consider epidurals   MRI C-spine 12/23/14 IMPRESSION:  Degenerative spondylosis at C3-4, C4-5 and C5-6 with narrowing of the canal, AP diameter 8 mm throughout that region. No cord compression or abnormal cord signal. Foraminal stenosis bilaterally at those levels that could affect the C4, C5 and C6 nerve roots.   Electronically Signed  By: Nelson Chimes M.D.  On: 12/23/2014 15:19  On Avinza/OXY  Potential benefits of a long term opioids use as well as potential risks (i.e. addiction risk, apnea etc) and complications (i.e. Somnolence, constipation and others) were explained to the patient and were aknowledged.  Will try to add Lyrica w/caution

## 2015-01-17 ENCOUNTER — Telehealth: Payer: Self-pay | Admitting: Internal Medicine

## 2015-01-17 NOTE — Telephone Encounter (Signed)
Pt called wanted to report that she is having a reaction to the pregabalin (LYRICA) 50 MG capsule [[id number]]. Breaking out in hives, hands and feet are swelling.  She has stop taking this med and will go to ER if she has shortness of breath    Best number -[phone]

## 2015-01-20 NOTE — Telephone Encounter (Signed)
Agree: stop Lyrica Thx

## 2015-01-29 ENCOUNTER — Ambulatory Visit (INDEPENDENT_AMBULATORY_CARE_PROVIDER_SITE_OTHER): Payer: Federal, State, Local not specified - PPO | Admitting: Internal Medicine

## 2015-01-29 ENCOUNTER — Encounter: Payer: Self-pay | Admitting: Internal Medicine

## 2015-01-29 VITALS — BP 150/80 | HR 80 | Wt 164.0 lb

## 2015-01-29 DIAGNOSIS — R609 Edema, unspecified: Secondary | ICD-10-CM | POA: Diagnosis not present

## 2015-01-29 DIAGNOSIS — M5010 Cervical disc disorder with radiculopathy, unspecified cervical region: Secondary | ICD-10-CM

## 2015-01-29 DIAGNOSIS — L509 Urticaria, unspecified: Secondary | ICD-10-CM | POA: Diagnosis not present

## 2015-01-29 MED ORDER — LORATADINE 10 MG PO TABS: 10.0000 mg | ORAL_TABLET | Freq: Every day | ORAL | Status: DC

## 2015-01-29 MED ORDER — FUROSEMIDE 20 MG PO TABS: 20.0000 mg | ORAL_TABLET | Freq: Every day | ORAL | Status: DC | PRN

## 2015-01-29 NOTE — Assessment & Plan Note (Signed)
6/16 due to Lyrica - d/c'd Resolved

## 2015-01-29 NOTE — Assessment & Plan Note (Signed)
6/16 ?etiology May need to d/c norvasc Lasix prn NAS diet

## 2015-01-29 NOTE — Assessment & Plan Note (Signed)
4/16 R  On Avinza/OXY  Potential benefits of a long term opioids use as well as potential risks (i.e. addiction risk, apnea etc) and complications (i.e. Somnolence, constipation and others) were explained to the patient and were aknowledged.  [facility]

## 2015-01-29 NOTE — Progress Notes (Signed)
Subjective:    Rash This is a new problem. The affected locations include the chest. The rash is characterized by redness. She was exposed to a new medication (Lyrica). Pertinent negatives include no fatigue or shortness of breath. The treatment provided significant relief. Her past medical history is significant for allergies.   C/o fluid retention while on vacation in [place] F/u muscle spasms in the R chest and R arm - not worse. Prednisone did not help...  The patient presents for a f/up on chronic hypertension, chronic hyperthyroidism.   F/u chronic R shoulder pain controlled w/opioids - much worse lately. C/o severe pain in the R neck with irrad  pain down R arm, R hand. C/o tingling, numbness in R thumb, hand  Wt Readings from Last 3 Encounters:  01/29/15 164 lb (74.39 kg)  01/03/15 166 lb (75.297 kg)  12/11/14 168 lb (76.204 kg)   BP Readings from Last 3 Encounters:  01/29/15 150/80  01/03/15 140/88  12/11/14 148/100       Review of Systems  Constitutional: Negative for chills, activity change, appetite change, fatigue and unexpected weight change.  HENT: Negative for mouth sores and sinus pressure.   Eyes: Negative for visual disturbance.  Respiratory: Negative for chest tightness and shortness of breath.   Genitourinary: Negative for frequency, difficulty urinating and vaginal pain.  Musculoskeletal: Positive for back pain (cervical) and arthralgias (R shoulder). Negative for gait problem.  Skin: Positive for rash. Negative for pallor.  Neurological: Negative for dizziness, tremors, weakness and numbness.  Psychiatric/Behavioral: Negative for suicidal ideas, confusion, sleep disturbance and dysphoric mood. The patient is not nervous/anxious.    B shoulders are tender w/ROM and stiff; B traps are firm and tender    Objective:   Physical Exam  Constitutional: She appears well-developed. No distress.  HENT:  Head: Normocephalic.  Right Ear: External ear normal.   Left Ear: External ear normal.  Nose: Nose normal.  Mouth/Throat: Oropharynx is clear and moist.  Eyes: Conjunctivae are normal. Pupils are equal, round, and reactive to light. Right eye exhibits no discharge. Left eye exhibits no discharge.  Neck: Normal range of motion. Neck supple. No JVD present. No tracheal deviation present. No thyromegaly present.  Cardiovascular: Normal rate, regular rhythm and normal heart sounds.   Pulmonary/Chest: No stridor. No respiratory distress. She has no wheezes.  Abdominal: Soft. Bowel sounds are normal. She exhibits no distension and no mass. There is no tenderness. There is no rebound and no guarding.  Musculoskeletal: She exhibits tenderness. She exhibits no edema.  Lymphadenopathy:    She has no cervical adenopathy.  Neurological: She displays normal reflexes. No cranial nerve deficit. She exhibits normal muscle tone. Coordination normal.  Skin: No rash noted. No erythema.  Psychiatric: She has a normal mood and affect. Her behavior is normal. Judgment and thought content normal.   No rash No edema R shoulder/trap is painful MS is ok   Lab Results  Component Value Date   WBC 5.3 07/03/2013   HGB 13.4 07/03/2013   HCT 39.1 07/03/2013   PLT 360.0 07/03/2013   GLUCOSE 76 03/01/2014   CHOL 152 07/03/2013   TRIG 121.0 07/03/2013   HDL 39.60 07/03/2013   LDLCALC 88 07/03/2013   ALT 27 07/03/2013   AST 23 07/03/2013   NA 140 03/01/2014   K 3.8 03/01/2014   CL 100 03/01/2014   CREATININE 0.9 03/01/2014   BUN 10 03/01/2014   CO2 32 03/01/2014   TSH 0.92 07/31/2014  MRI C-spine 12/23/14 IMPRESSION:  Degenerative spondylosis at C3-4, C4-5 and C5-6 with narrowing of the canal, AP diameter 8 mm throughout that region. No cord compression or abnormal cord signal. Foraminal stenosis bilaterally at those levels that could affect the C4, C5 and C6 nerve roots.   Electronically Signed  By: Nelson Chimes M.D.  On: 12/23/2014  15:19   IMPRESSION R shoulder MRI 05/06/14:  Rotator cuff tendinopathy without tear.  Negative for labral tear.  Status post acromioplasty and resection of the acromioclavicular joint without complicating feature.   Electronically Signed  By: Inge Rise M.D.  On: 05/06/2014 16:12   Labs reviewed     Assessment & Plan:

## 2015-01-29 NOTE — Progress Notes (Signed)
Pre visit review using our clinic review tool, if applicable. No additional management support is needed unless otherwise documented below in the visit note. 

## 2015-02-24 ENCOUNTER — Ambulatory Visit (INDEPENDENT_AMBULATORY_CARE_PROVIDER_SITE_OTHER): Payer: Federal, State, Local not specified - PPO | Admitting: Internal Medicine

## 2015-02-24 ENCOUNTER — Encounter: Payer: Self-pay | Admitting: Internal Medicine

## 2015-02-24 VITALS — BP 118/80 | HR 84 | Wt 166.0 lb

## 2015-02-24 DIAGNOSIS — I10 Essential (primary) hypertension: Secondary | ICD-10-CM | POA: Diagnosis not present

## 2015-02-24 DIAGNOSIS — L509 Urticaria, unspecified: Secondary | ICD-10-CM

## 2015-02-24 DIAGNOSIS — R609 Edema, unspecified: Secondary | ICD-10-CM | POA: Diagnosis not present

## 2015-02-24 NOTE — Assessment & Plan Note (Signed)
No relapse 

## 2015-02-24 NOTE — Progress Notes (Signed)
Subjective:  Patient ID: Karina Newman, female    DOB: Apr 07, 1956  Age: 59 y.o. MRN: [id number]  CC: No chief complaint on file.   HPI Karina Newman presents for hives and swelling on LE. Both resolved. F/u HTN  Outpatient Prescriptions Prior to Visit  Medication Sig Dispense Refill  . ergocalciferol (VITAMIN D2) 50000 UNITS capsule Take 1 capsule (50,000 Units total) by mouth every 30 (thirty) days. 6 capsule 3  . furosemide (LASIX) 20 MG tablet Take 1-2 tablets (20-40 mg total) by mouth daily as needed for fluid or edema. 60 tablet 5  . Ketoprofen 10 % CREA APPLY 1-3G DAILY THREE TIMES DAILY. 120 g 1  . loratadine (CLARITIN) 10 MG tablet Take 1 tablet (10 mg total) by mouth daily. 100 tablet 3  . morphine (AVINZA) 60 MG 24 hr capsule Take 1 capsule (60 mg total) by mouth daily. Please fill on or after 01/25/15 30 capsule 0  . NONFORMULARY OR COMPOUNDED ITEM Topical pain cream uses 1-2 grams three times daily as needed.    . Olmesartan-Amlodipine-HCTZ (TRIBENZOR) 40-10-25 MG TABS Take 1 tablet by mouth 1 day or 1 dose. 90 tablet 3  . oxyCODONE (ROXICODONE) 15 MG immediate release tablet Take 1 tablet (15 mg total) by mouth 2 (two) times daily as needed for pain. Please fill on or after 01/25/15 60 tablet 0  . pseudoephedrine (SUDAFED) 120 MG 12 hr tablet Take 1 tablet (120 mg total) by mouth 2 (two) times daily as needed for congestion. 30 tablet 1   No facility-administered medications prior to visit.    ROS Review of Systems  Constitutional: Negative for chills, activity change, appetite change, fatigue and unexpected weight change.  HENT: Negative for congestion, mouth sores and sinus pressure.   Eyes: Negative for visual disturbance.  Respiratory: Negative for cough and chest tightness.   Gastrointestinal: Negative for nausea and abdominal pain.  Genitourinary: Negative for frequency, difficulty urinating and vaginal pain.  Musculoskeletal: Positive for back pain, arthralgias,  neck pain and neck stiffness. Negative for gait problem.  Skin: Negative for pallor and rash.  Neurological: Negative for dizziness, tremors, weakness, numbness and headaches.  Psychiatric/Behavioral: Negative for confusion and sleep disturbance. The patient is not nervous/anxious.     Objective:  BP 118/80 mmHg  Pulse 84  Wt 166 lb (75.297 kg)  SpO2 94%  BP Readings from Last 3 Encounters:  02/24/15 118/80  01/29/15 150/80  01/03/15 140/88    Wt Readings from Last 3 Encounters:  02/24/15 166 lb (75.297 kg)  01/29/15 164 lb (74.39 kg)  01/03/15 166 lb (75.297 kg)    Physical Exam  Constitutional: She appears well-developed. No distress.  HENT:  Head: Normocephalic.  Right Ear: External ear normal.  Left Ear: External ear normal.  Nose: Nose normal.  Mouth/Throat: Oropharynx is clear and moist.  Eyes: Conjunctivae are normal. Pupils are equal, round, and reactive to light. Right eye exhibits no discharge. Left eye exhibits no discharge.  Neck: Normal range of motion. Neck supple. No JVD present. No tracheal deviation present. No thyromegaly present.  Cardiovascular: Normal rate, regular rhythm and normal heart sounds.   Pulmonary/Chest: No stridor. No respiratory distress. She has no wheezes.  Abdominal: Soft. Bowel sounds are normal. She exhibits no distension and no mass. There is no tenderness. There is no rebound and no guarding.  Musculoskeletal: She exhibits tenderness. She exhibits no edema.  Lymphadenopathy:    She has no cervical adenopathy.  Neurological: She displays normal reflexes.  No cranial nerve deficit. She exhibits normal muscle tone. Coordination normal.  Skin: No rash noted. No erythema.  Psychiatric: She has a normal mood and affect. Her behavior is normal. Judgment and thought content normal.  Cervical pain and shoulders - tender w/ROM  Lab Results  Component Value Date   WBC 5.3 07/03/2013   HGB 13.4 07/03/2013   HCT 39.1 07/03/2013   PLT 360.0  07/03/2013   GLUCOSE 76 03/01/2014   CHOL 152 07/03/2013   TRIG 121.0 07/03/2013   HDL 39.60 07/03/2013   LDLCALC 88 07/03/2013   ALT 27 07/03/2013   AST 23 07/03/2013   NA 140 03/01/2014   K 3.8 03/01/2014   CL 100 03/01/2014   CREATININE 0.9 03/01/2014   BUN 10 03/01/2014   CO2 32 03/01/2014   TSH 0.92 07/31/2014    Mr Cervical Spine Wo Contrast  12/23/2014   CLINICAL DATA:  Neck pain and right shoulder pain. Numbness in the arm and hands.  EXAM: MRI CERVICAL SPINE WITHOUT CONTRAST  TECHNIQUE: Multiplanar, multisequence MR imaging of the cervical spine was performed. No intravenous contrast was administered.  COMPARISON:  None.  FINDINGS: The foramen magnum is widely patent.  C1-2 is normal.  C2-3: Mild bulging of the disc. No significant narrowing of the canal or foramina.  C3-4: Spondylosis with endplate osteophytes. Narrowing of the subarachnoid space but no cord compression. AP diameter of the canal 8 mm. Foraminal stenosis right more than left could affect the C4 nerve roots.  C4-5: Spondylosis with endplate osteophytes and bulging of the disc. Canal narrowing with AP diameter of 8 mm. No cord compression. Foraminal narrowing bilaterally could affect the C5 nerve roots.  C5-6: Spondylosis with endplate osteophytes and bulging of the disc. AP diameter of the canal 8 mm. Foraminal narrowing bilaterally could affect the C6 nerve roots.  C6-7: Endplate osteophytes and bulging of the disc. AP diameter of the canal 10 mm. No foraminal stenosis.  C7-T1:  Normal interspace.  IMPRESSION: Degenerative spondylosis at C3-4, C4-5 and C5-6 with narrowing of the canal, AP diameter 8 mm throughout that region. No cord compression or abnormal cord signal. Foraminal stenosis bilaterally at those levels that could affect the C4, C5 and C6 nerve roots.   Electronically Signed   By: Nelson Chimes M.D.   On: 12/23/2014 15:19    Assessment & Plan:   Diagnoses and all orders for this  visit:  Hives  Accelerated hypertension  I am having Ms. Tatro maintain her NONFORMULARY OR COMPOUNDED ITEM, pseudoephedrine, Ketoprofen, Olmesartan-Amlodipine-HCTZ, oxyCODONE, morphine, ergocalciferol, furosemide, and loratadine.  No orders of the defined types were placed in this encounter.     Follow-up: Return for a follow-up visit.  Walker Kehr, MD

## 2015-02-24 NOTE — Assessment & Plan Note (Signed)
On Tribenzor BP is ok

## 2015-02-24 NOTE — Progress Notes (Signed)
Pre visit review using our clinic review tool, if applicable. No additional management support is needed unless otherwise documented below in the visit note. 

## 2015-02-24 NOTE — Assessment & Plan Note (Signed)
Resolved

## 2015-02-26 ENCOUNTER — Ambulatory Visit: Payer: Self-pay | Admitting: Internal Medicine

## 2015-03-03 ENCOUNTER — Ambulatory Visit (INDEPENDENT_AMBULATORY_CARE_PROVIDER_SITE_OTHER): Admitting: Internal Medicine

## 2015-03-03 ENCOUNTER — Encounter: Payer: Self-pay | Admitting: Internal Medicine

## 2015-03-03 VITALS — BP 138/96 | HR 76 | Wt 166.0 lb

## 2015-03-03 DIAGNOSIS — M25511 Pain in right shoulder: Secondary | ICD-10-CM | POA: Diagnosis not present

## 2015-03-03 DIAGNOSIS — M5010 Cervical disc disorder with radiculopathy, unspecified cervical region: Secondary | ICD-10-CM | POA: Diagnosis not present

## 2015-03-03 DIAGNOSIS — M25512 Pain in left shoulder: Secondary | ICD-10-CM

## 2015-03-03 MED ORDER — MORPHINE SULFATE ER BEADS 60 MG PO CP24: 60.0000 mg | ORAL_CAPSULE | Freq: Every day | ORAL | Status: DC

## 2015-03-03 MED ORDER — OXYCODONE HCL 15 MG PO TABS: 15.0000 mg | ORAL_TABLET | Freq: Two times a day (BID) | ORAL | Status: DC | PRN

## 2015-03-03 MED ORDER — KETOPROFEN 10 % CREA: TOPICAL_CREAM | Status: DC

## 2015-03-03 NOTE — Progress Notes (Signed)
Subjective:  Patient ID: Karina Newman, female    DOB: 1955-11-20  Age: 59 y.o. MRN: [id number]  CC: No chief complaint on file.   HPI   [name] presents for R shoulder and neck pain  Outpatient Prescriptions Prior to Visit  Medication Sig Dispense Refill  . ergocalciferol (VITAMIN D2) 50000 UNITS capsule Take 1 capsule (50,000 Units total) by mouth every 30 (thirty) days. 6 capsule 3  . furosemide (LASIX) 20 MG tablet Take 1-2 tablets (20-40 mg total) by mouth daily as needed for fluid or edema. 60 tablet 5  . loratadine (CLARITIN) 10 MG tablet Take 1 tablet (10 mg total) by mouth daily. 100 tablet 3  . Olmesartan-Amlodipine-HCTZ (TRIBENZOR) 40-10-25 MG TABS Take 1 tablet by mouth 1 day or 1 dose. 90 tablet 3  . pseudoephedrine (SUDAFED) 120 MG 12 hr tablet Take 1 tablet (120 mg total) by mouth 2 (two) times daily as needed for congestion. 30 tablet 1  . Ketoprofen 10 % CREA APPLY 1-3G DAILY THREE TIMES DAILY. 120 g 1  . morphine (AVINZA) 60 MG 24 hr capsule Take 1 capsule (60 mg total) by mouth daily. Please fill on or after 01/25/15 30 capsule 0  . NONFORMULARY OR COMPOUNDED ITEM Topical pain cream uses 1-2 grams three times daily as needed.    Marland Kitchen oxyCODONE (ROXICODONE) 15 MG immediate release tablet Take 1 tablet (15 mg total) by mouth 2 (two) times daily as needed for pain. Please fill on or after 01/25/15 60 tablet 0   No facility-administered medications prior to visit.    ROS Review of Systems  Objective:  BP 138/96 mmHg  Pulse 76  Wt 166 lb (75.297 kg)  SpO2 97%  BP Readings from Last 3 Encounters:  03/03/15 138/96  02/24/15 118/80  01/29/15 150/80    Wt Readings from Last 3 Encounters:  03/03/15 166 lb (75.297 kg)  02/24/15 166 lb (75.297 kg)  01/29/15 164 lb (74.39 kg)    Physical Exam  Lab Results  Component Value Date   WBC 5.3 07/03/2013   HGB 13.4 07/03/2013   HCT 39.1 07/03/2013   PLT 360.0 07/03/2013   GLUCOSE 76 03/01/2014   CHOL 152  07/03/2013   TRIG 121.0 07/03/2013   HDL 39.60 07/03/2013   LDLCALC 88 07/03/2013   ALT 27 07/03/2013   AST 23 07/03/2013   NA 140 03/01/2014   K 3.8 03/01/2014   CL 100 03/01/2014   CREATININE 0.9 03/01/2014   BUN 10 03/01/2014   CO2 32 03/01/2014   TSH 0.92 07/31/2014    Mr Cervical Spine Wo Contrast  12/23/2014   CLINICAL DATA:  Neck pain and right shoulder pain. Numbness in the arm and hands.  EXAM: MRI CERVICAL SPINE WITHOUT CONTRAST  TECHNIQUE: Multiplanar, multisequence MR imaging of the cervical spine was performed. No intravenous contrast was administered.  COMPARISON:  None.  FINDINGS: The foramen magnum is widely patent.  C1-2 is normal.  C2-3: Mild bulging of the disc. No significant narrowing of the canal or foramina.  C3-4: Spondylosis with endplate osteophytes. Narrowing of the subarachnoid space but no cord compression. AP diameter of the canal 8 mm. Foraminal stenosis right more than left could affect the C4 nerve roots.  C4-5: Spondylosis with endplate osteophytes and bulging of the disc. Canal narrowing with AP diameter of 8 mm. No cord compression. Foraminal narrowing bilaterally could affect the C5 nerve roots.  C5-6: Spondylosis with endplate osteophytes and bulging of the disc. AP diameter  of the canal 8 mm. Foraminal narrowing bilaterally could affect the C6 nerve roots.  C6-7: Endplate osteophytes and bulging of the disc. AP diameter of the canal 10 mm. No foraminal stenosis.  C7-T1:  Normal interspace.  IMPRESSION: Degenerative spondylosis at C3-4, C4-5 and C5-6 with narrowing of the canal, AP diameter 8 mm throughout that region. No cord compression or abnormal cord signal. Foraminal stenosis bilaterally at those levels that could affect the C4, C5 and C6 nerve roots.   Electronically Signed   By: Nelson Chimes M.D.   On: 12/23/2014 15:19    Assessment & Plan:   There are no diagnoses linked to this encounter. I have discontinued Ms. Quattrone's NONFORMULARY OR COMPOUNDED  ITEM, oxyCODONE, morphine, oxyCODONE, and morphine. I have also changed her oxyCODONE and morphine. Additionally, I am having her maintain her pseudoephedrine, Olmesartan-Amlodipine-HCTZ, ergocalciferol, furosemide, loratadine, and Ketoprofen.  Meds ordered this encounter  Medications  . Ketoprofen 10 % CREA    Sig: APPLY 1-3G DAILY THREE TIMES DAILY.    Dispense:  120 g    Refill:  1  . DISCONTD: oxyCODONE (ROXICODONE) 15 MG immediate release tablet    Sig: Take 1 tablet (15 mg total) by mouth 2 (two) times daily as needed for pain. Please fill on or after 03/05/15    Dispense:  60 tablet    Refill:  0  . DISCONTD: morphine (AVINZA) 60 MG 24 hr capsule    Sig: Take 1 capsule (60 mg total) by mouth daily. Please fill on or after 03/05/15    Dispense:  30 capsule    Refill:  0  . DISCONTD: oxyCODONE (ROXICODONE) 15 MG immediate release tablet    Sig: Take 1 tablet (15 mg total) by mouth 2 (two) times daily as needed for pain. Please fill on or after 04/05/15    Dispense:  60 tablet    Refill:  0  . DISCONTD: morphine (AVINZA) 60 MG 24 hr capsule    Sig: Take 1 capsule (60 mg total) by mouth daily. Please fill on or after 04/05/15    Dispense:  30 capsule    Refill:  0  . oxyCODONE (ROXICODONE) 15 MG immediate release tablet    Sig: Take 1 tablet (15 mg total) by mouth 2 (two) times daily as needed for pain. Please fill on or after 05/06/15    Dispense:  60 tablet    Refill:  0  . morphine (AVINZA) 60 MG 24 hr capsule    Sig: Take 1 capsule (60 mg total) by mouth daily. Please fill on or after 05/06/15    Dispense:  30 capsule    Refill:  0     Follow-up: Return in about 3 months (around 06/03/2015) for a follow-up visit.  Walker Kehr, MD

## 2015-03-03 NOTE — Patient Instructions (Signed)
Google "TRE - trauma release exercise"

## 2015-03-03 NOTE — Progress Notes (Signed)
Pre visit review using our clinic review tool, if applicable. No additional management support is needed unless otherwise documented below in the visit note. 

## 2015-03-05 NOTE — Assessment & Plan Note (Signed)
On Avinza/OXY  Potential benefits of a long term opioids use as well as potential risks (i.e. addiction risk, apnea etc) and complications (i.e. Somnolence, constipation and others) were explained to the patient and were aknowledged.

## 2015-03-05 NOTE — Assessment & Plan Note (Addendum)
Better  On Avinza/OXY  Potential benefits of a long term opioids use as well as potential risks (i.e. addiction risk, apnea etc) and complications (i.e. Somnolence, constipation and others) were explained to the patient and were aknowledged.

## 2015-03-14 ENCOUNTER — Encounter: Payer: Self-pay | Admitting: Internal Medicine

## 2015-03-14 ENCOUNTER — Ambulatory Visit (INDEPENDENT_AMBULATORY_CARE_PROVIDER_SITE_OTHER): Payer: Federal, State, Local not specified - PPO | Admitting: Internal Medicine

## 2015-03-14 VITALS — BP 130/82 | HR 90 | Temp 98.7°F | Wt 165.0 lb

## 2015-03-14 DIAGNOSIS — I10 Essential (primary) hypertension: Secondary | ICD-10-CM | POA: Diagnosis not present

## 2015-03-14 DIAGNOSIS — I889 Nonspecific lymphadenitis, unspecified: Secondary | ICD-10-CM | POA: Diagnosis not present

## 2015-03-14 DIAGNOSIS — B9689 Other specified bacterial agents as the cause of diseases classified elsewhere: Secondary | ICD-10-CM

## 2015-03-14 DIAGNOSIS — J329 Chronic sinusitis, unspecified: Secondary | ICD-10-CM

## 2015-03-14 DIAGNOSIS — G43009 Migraine without aura, not intractable, without status migrainosus: Secondary | ICD-10-CM

## 2015-03-14 DIAGNOSIS — E0590 Thyrotoxicosis, unspecified without thyrotoxic crisis or storm: Secondary | ICD-10-CM

## 2015-03-14 DIAGNOSIS — A499 Bacterial infection, unspecified: Secondary | ICD-10-CM

## 2015-03-14 MED ORDER — ONDANSETRON HCL 4 MG PO TABS: 4.0000 mg | ORAL_TABLET | Freq: Three times a day (TID) | ORAL | Status: DC | PRN

## 2015-03-14 MED ORDER — OMEPRAZOLE MAGNESIUM 20 MG PO TBEC: 20.0000 mg | DELAYED_RELEASE_TABLET | Freq: Every day | ORAL | Status: DC

## 2015-03-14 MED ORDER — LEVOFLOXACIN 500 MG PO TABS: 500.0000 mg | ORAL_TABLET | Freq: Every day | ORAL | Status: DC

## 2015-03-14 MED ORDER — SUMATRIPTAN SUCCINATE 100 MG PO TABS
100.0000 mg | ORAL_TABLET | ORAL | Status: DC | PRN
Start: 2015-03-14 — End: 2015-11-05

## 2015-03-14 MED ORDER — INDOMETHACIN 50 MG PO CAPS: 50.0000 mg | ORAL_CAPSULE | Freq: Three times a day (TID) | ORAL | Status: DC | PRN

## 2015-03-14 NOTE — Progress Notes (Signed)
Pre visit review using our clinic review tool, if applicable. No additional management support is needed unless otherwise documented below in the visit note. 

## 2015-03-14 NOTE — Progress Notes (Signed)
Subjective:  Patient ID: Karina Newman, female    DOB: 10/27/1955  Age: 59 y.o. MRN: [id number]  CC: No chief complaint on file.   HPI [name] presents for a HA w/nausea x 1 wk. C/o a knot in R axilla x 1 wk.   Outpatient Prescriptions Prior to Visit  Medication Sig Dispense Refill  . ergocalciferol (VITAMIN D2) 50000 UNITS capsule Take 1 capsule (50,000 Units total) by mouth every 30 (thirty) days. 6 capsule 3  . furosemide (LASIX) 20 MG tablet Take 1-2 tablets (20-40 mg total) by mouth daily as needed for fluid or edema. 60 tablet 5  . Ketoprofen 10 % CREA APPLY 1-3G DAILY THREE TIMES DAILY. 120 g 1  . loratadine (CLARITIN) 10 MG tablet Take 1 tablet (10 mg total) by mouth daily. 100 tablet 3  . morphine (AVINZA) 60 MG 24 hr capsule Take 1 capsule (60 mg total) by mouth daily. Please fill on or after 05/06/15 30 capsule 0  . Olmesartan-Amlodipine-HCTZ (TRIBENZOR) 40-10-25 MG TABS Take 1 tablet by mouth 1 day or 1 dose. 90 tablet 3  . oxyCODONE (ROXICODONE) 15 MG immediate release tablet Take 1 tablet (15 mg total) by mouth 2 (two) times daily as needed for pain. Please fill on or after 05/06/15 60 tablet 0  . pseudoephedrine (SUDAFED) 120 MG 12 hr tablet Take 1 tablet (120 mg total) by mouth 2 (two) times daily as needed for congestion. 30 tablet 1   No facility-administered medications prior to visit.    ROS Review of Systems  HENT: Positive for sinus pressure.   Eyes: Positive for photophobia. Negative for visual disturbance.  Gastrointestinal: Positive for nausea.  Neurological: Positive for headaches.  Psychiatric/Behavioral: Negative for suicidal ideas.    Objective:  BP 130/82 mmHg  Pulse 90  Temp(Src) 98.7 F (37.1 C) (Oral)  Wt 165 lb (74.844 kg)  SpO2 97%  BP Readings from Last 3 Encounters:  03/14/15 130/82  03/03/15 138/96  02/24/15 118/80    Wt Readings from Last 3 Encounters:  03/14/15 165 lb (74.844 kg)  03/03/15 166 lb (75.297 kg)  02/24/15 166  lb (75.297 kg)    Physical Exam  Constitutional: She appears well-developed. No distress.  HENT:  Head: Normocephalic.  Right Ear: External ear normal.  Left Ear: External ear normal.  Nose: Nose normal.  Mouth/Throat: Oropharynx is clear and moist.  Eyes: Conjunctivae are normal. Pupils are equal, round, and reactive to light. Right eye exhibits no discharge. Left eye exhibits no discharge.  Neck: Normal range of motion. Neck supple. No JVD present. No tracheal deviation present. No thyromegaly present.  Cardiovascular: Normal rate, regular rhythm and normal heart sounds.   Pulmonary/Chest: No stridor. No respiratory distress. She has no wheezes.  Abdominal: Soft. Bowel sounds are normal. She exhibits no distension and no mass. There is no tenderness. There is no rebound and no guarding.  Musculoskeletal: She exhibits no edema or tenderness.  Lymphadenopathy:    She has no cervical adenopathy.  Neurological: She displays normal reflexes. No cranial nerve deficit. She exhibits normal muscle tone. Coordination normal.  Skin: No rash noted. No erythema.  Psychiatric: She has a normal mood and affect. Her behavior is normal. Judgment and thought content normal.  Looks tired A pea-size knot in R axilla  Lab Results  Component Value Date   WBC 5.3 07/03/2013   HGB 13.4 07/03/2013   HCT 39.1 07/03/2013   PLT 360.0 07/03/2013   GLUCOSE 76 03/01/2014  CHOL 152 07/03/2013   TRIG 121.0 07/03/2013   HDL 39.60 07/03/2013   LDLCALC 88 07/03/2013   ALT 27 07/03/2013   AST 23 07/03/2013   NA 140 03/01/2014   K 3.8 03/01/2014   CL 100 03/01/2014   CREATININE 0.9 03/01/2014   BUN 10 03/01/2014   CO2 32 03/01/2014   TSH 0.92 07/31/2014    Mr Cervical Spine Wo Contrast  12/23/2014   CLINICAL DATA:  Neck pain and right shoulder pain. Numbness in the arm and hands.  EXAM: MRI CERVICAL SPINE WITHOUT CONTRAST  TECHNIQUE: Multiplanar, multisequence MR imaging of the cervical spine was  performed. No intravenous contrast was administered.  COMPARISON:  None.  FINDINGS: The foramen magnum is widely patent.  C1-2 is normal.  C2-3: Mild bulging of the disc. No significant narrowing of the canal or foramina.  C3-4: Spondylosis with endplate osteophytes. Narrowing of the subarachnoid space but no cord compression. AP diameter of the canal 8 mm. Foraminal stenosis right more than left could affect the C4 nerve roots.  C4-5: Spondylosis with endplate osteophytes and bulging of the disc. Canal narrowing with AP diameter of 8 mm. No cord compression. Foraminal narrowing bilaterally could affect the C5 nerve roots.  C5-6: Spondylosis with endplate osteophytes and bulging of the disc. AP diameter of the canal 8 mm. Foraminal narrowing bilaterally could affect the C6 nerve roots.  C6-7: Endplate osteophytes and bulging of the disc. AP diameter of the canal 10 mm. No foraminal stenosis.  C7-T1:  Normal interspace.  IMPRESSION: Degenerative spondylosis at C3-4, C4-5 and C5-6 with narrowing of the canal, AP diameter 8 mm throughout that region. No cord compression or abnormal cord signal. Foraminal stenosis bilaterally at those levels that could affect the C4, C5 and C6 nerve roots.   Electronically Signed   By: Nelson Chimes M.D.   On: 12/23/2014 15:19    Assessment & Plan:   There are no diagnoses linked to this encounter. I am having Ms. Goeken start on ondansetron, SUMAtriptan, omeprazole, and indomethacin. I am also having her maintain her pseudoephedrine, Olmesartan-Amlodipine-HCTZ, ergocalciferol, furosemide, loratadine, Ketoprofen, oxyCODONE, morphine, and levofloxacin.  Meds ordered this encounter  Medications  . ondansetron (ZOFRAN) 4 MG tablet    Sig: Take 1 tablet (4 mg total) by mouth every 8 (eight) hours as needed for nausea or vomiting.    Dispense:  20 tablet    Refill:  0  . SUMAtriptan (IMITREX) 100 MG tablet    Sig: Take 1 tablet (100 mg total) by mouth every 2 (two) hours as  needed for migraine or headache. May repeat in 2 hours if headache persists or recurs.    Dispense:  10 tablet    Refill:  2  . omeprazole (PRILOSEC OTC) 20 MG tablet    Sig: Take 1 tablet (20 mg total) by mouth daily.    Dispense:  30 tablet    Refill:  0  . indomethacin (INDOCIN) 50 MG capsule    Sig: Take 1 capsule (50 mg total) by mouth 3 (three) times daily as needed for moderate pain.    Dispense:  30 capsule    Refill:  1  . DISCONTD: levofloxacin (LEVAQUIN) 500 MG tablet    Sig: Take 1 tablet (500 mg total) by mouth daily.    Dispense:  7 tablet    Refill:  0  . levofloxacin (LEVAQUIN) 500 MG tablet    Sig: Take 1 tablet (500 mg total) by mouth daily.  Dispense:  7 tablet    Refill:  0     Follow-up: No Follow-up on file.  Walker Kehr, MD

## 2015-03-23 DIAGNOSIS — G43909 Migraine, unspecified, not intractable, without status migrainosus: Secondary | ICD-10-CM | POA: Insufficient documentation

## 2015-03-23 DIAGNOSIS — I889 Nonspecific lymphadenitis, unspecified: Secondary | ICD-10-CM | POA: Insufficient documentation

## 2015-03-23 NOTE — Assessment & Plan Note (Signed)
7/16 Imitrex, Zofran

## 2015-03-23 NOTE — Assessment & Plan Note (Signed)
On Trazodone 

## 2015-03-23 NOTE — Assessment & Plan Note (Signed)
levaquin  

## 2015-03-23 NOTE — Assessment & Plan Note (Signed)
R axilla<1 cm Po Levaquin

## 2015-03-23 NOTE — Assessment & Plan Note (Signed)
Monitor TSH 

## 2015-03-31 ENCOUNTER — Telehealth: Payer: Self-pay | Admitting: Internal Medicine

## 2015-03-31 ENCOUNTER — Emergency Department (HOSPITAL_COMMUNITY)
Admission: EM | Admit: 2015-03-31 | Discharge: 2015-03-31 | Disposition: A | Discharge status: Home / Self Care | Payer: Federal, State, Local not specified - PPO | Attending: Emergency Medicine | Admitting: Emergency Medicine

## 2015-03-31 ENCOUNTER — Encounter (HOSPITAL_COMMUNITY): Payer: Self-pay | Admitting: *Deleted

## 2015-03-31 ENCOUNTER — Emergency Department (HOSPITAL_COMMUNITY): Payer: Federal, State, Local not specified - PPO

## 2015-03-31 DIAGNOSIS — I10 Essential (primary) hypertension: Secondary | ICD-10-CM | POA: Insufficient documentation

## 2015-03-31 DIAGNOSIS — R1310 Dysphagia, unspecified: Secondary | ICD-10-CM | POA: Insufficient documentation

## 2015-03-31 DIAGNOSIS — G8929 Other chronic pain: Secondary | ICD-10-CM | POA: Diagnosis not present

## 2015-03-31 DIAGNOSIS — Z8639 Personal history of other endocrine, nutritional and metabolic disease: Secondary | ICD-10-CM | POA: Diagnosis not present

## 2015-03-31 DIAGNOSIS — Z880 Allergy status to penicillin: Secondary | ICD-10-CM | POA: Diagnosis not present

## 2015-03-31 DIAGNOSIS — R079 Chest pain, unspecified: Secondary | ICD-10-CM | POA: Diagnosis present

## 2015-03-31 DIAGNOSIS — F42 Obsessive-compulsive disorder: Secondary | ICD-10-CM | POA: Insufficient documentation

## 2015-03-31 DIAGNOSIS — M25511 Pain in right shoulder: Secondary | ICD-10-CM | POA: Insufficient documentation

## 2015-03-31 DIAGNOSIS — Z79899 Other long term (current) drug therapy: Secondary | ICD-10-CM | POA: Diagnosis not present

## 2015-03-31 DIAGNOSIS — R51 Headache: Secondary | ICD-10-CM | POA: Insufficient documentation

## 2015-03-31 DIAGNOSIS — R0602 Shortness of breath: Secondary | ICD-10-CM | POA: Diagnosis not present

## 2015-03-31 LAB — CBC
HCT: 40.2 % (ref 36.0–46.0)
Hemoglobin: 13.7 g/dL (ref 12.0–15.0)
MCH: 31.4 pg (ref 26.0–34.0)
MCHC: 34.1 g/dL (ref 30.0–36.0)
MCV: 92 fL (ref 78.0–100.0)
PLATELETS: 385 10*3/uL (ref 150–400)
RBC: 4.37 MIL/uL (ref 3.87–5.11)
RDW: 11.7 % (ref 11.5–15.5)
WBC: 5.7 10*3/uL (ref 4.0–10.5)

## 2015-03-31 LAB — BASIC METABOLIC PANEL
Anion gap: 9 (ref 5–15)
BUN: 10 mg/dL (ref 6–20)
CALCIUM: 9.3 mg/dL (ref 8.9–10.3)
CO2: 29 mmol/L (ref 22–32)
CREATININE: 0.88 mg/dL (ref 0.44–1.00)
Chloride: 100 mmol/L — ABNORMAL LOW (ref 101–111)
GFR calc Af Amer: >60 mL/min (ref >60)
GLUCOSE: 88 mg/dL (ref 65–99)
Potassium: 3.2 mmol/L — ABNORMAL LOW (ref 3.5–5.1)
SODIUM: 138 mmol/L (ref 135–145)

## 2015-03-31 LAB — I-STAT TROPONIN, ED: Troponin i, poc: 0 ng/mL (ref 0.00–0.08)

## 2015-03-31 MED ORDER — PANTOPRAZOLE SODIUM 20 MG PO TBEC: 20.0000 mg | DELAYED_RELEASE_TABLET | Freq: Every day | ORAL | Status: DC

## 2015-03-31 MED ORDER — POTASSIUM CHLORIDE CRYS ER 20 MEQ PO TBCR
40.0000 meq | EXTENDED_RELEASE_TABLET | Freq: Once | ORAL | Status: AC
  Administered 2015-03-31: 40 meq via ORAL
  Filled 2015-03-31: qty 2

## 2015-03-31 NOTE — Discharge Instructions (Signed)

## 2015-03-31 NOTE — Telephone Encounter (Signed)
Patient Name: Karina Newman  DOB: 1956-05-10    Initial Comment Caller states she is having chest pain on the left side with breathing issues   Nurse Assessment  Nurse: Leilani Merl, RN, Heather Date/Time (Eastern Time): 03/31/2015 9:54:56 AM  Confirm and document reason for call. If symptomatic, describe symptoms. ---Caller states she is having chest pain on the left side with breathing issues for the last 3 days  Has the patient traveled out of the country within the last 30 days? ---Not Applicable  Does the patient require triage? ---Yes  Related visit to physician within the last 2 weeks? ---No  Does the PT have any chronic conditions? (i.e. diabetes, asthma, etc.) ---Yes  List chronic conditions. ---HTN     Guidelines    Guideline Title Affirmed Question Affirmed Notes  Chest Pain Difficulty breathing    Final Disposition User   Go to ED Now Standifer, RN, Heather    Referrals  GO TO FACILITY UNDECIDED   Disagree/Comply: Comply

## 2015-03-31 NOTE — ED Provider Notes (Signed)
CSN: [id number]     Arrival date & time 03/31/15  1612 History   First MD Initiated Contact with Patient 03/31/15 1824     Chief Complaint  Patient presents with  . Chest Pain  . Shortness of Breath  . Headache     (Consider location/radiation/quality/duration/timing/severity/associated sxs/prior Treatment) HPI   Karina Newman is a 59 year old female with history of hypertension, chronic right shoulder pain with chronic narcotic use, also reported mitral valve prolapse per the patient, who presents to the ER with 3 days of central and left-sided chest pain, felt under her left breast, described as a tight "muscle spasm" that is intermittent and without radiation.  Its occurrence seems to have no relationship to activity, frequently comes when she is at rest, it is not made worse with movement, palpation, or inspiration.  She has tried Zantac without relief.  She complains of shortness of breath, headache and lightheadedness, which do not seem to be temporally related to the onset of chest pain. She states that she gets lightheaded when going from lying to standing. Her headaches feel like she has a rubber band around her head, and has a hx of headaches.  Denies headache right now. She has a history of shortness of breath, which is at her baseline, she denies cough, wheeze, fever, URI symptoms, and a unchanged 2-3 pillow orthopnea, and possible apneic episodes that occur at night.  She denies any palpitations, lower extremity edema, nausea, diaphoresis.  In the past 2 days she has had trouble swallowing liquids, but has not had difficulty eating solids such as toast this morning. She states that when she tries to swallow water she spits it back up however denies vomiting or retching episodes.  She denies abdominal pain, N, V, D, constipation, abdominal distention, melena, hematochezia, hematemasis.  She has been given GERD medicines to try by her PCP, but has only used them intermittently.    She  denies fever, weakness, syncope.  Past Medical History  Diagnosis Date  . Thyroid disease     Hyperthyroid. Was on PTU (Dr Hampton Abbot in [place]) - stopped in 2013, stable  . Right shoulder pain 2001    chronic pain  . Hypertension   . Abnormal cervical Pap smear with positive HPV DNA test 01/2014    Normal cytology with positive high-risk HPV 18/45. Colposcopy negative with negative ECC   Past Surgical History  Procedure Laterality Date  . Cesarean section      x 4   . Shoulder surgery Right 2002  . Shoulder surgery Left 2014  . Hand surgery    . Tubal ligation     Family History  Problem Relation Age of Onset  . Kidney disease Mother     ESRD  . Hypertension Mother   . Cancer Father     lung ca  . Hypertension Sister   . Diabetes Brother    Social History  Substance Use Topics  . Smoking status: Never Smoker   . Smokeless tobacco: None  . Alcohol Use: Yes     Comment: social   OB History    Gravida Para Term Preterm AB TAB SAB Ectopic Multiple Living   5 4   1  1   4      Review of Systems  Constitutional: Negative.  Negative for activity change and appetite change.  HENT: Positive for trouble swallowing. Negative for congestion, sore throat and voice change.   Eyes: Negative.   Respiratory: Positive for apnea and shortness  of breath. Negative for cough, choking, chest tightness, wheezing and stridor.   Cardiovascular: Positive for chest pain. Negative for palpitations and leg swelling.  Gastrointestinal: Negative for nausea, vomiting, abdominal pain, diarrhea and constipation.  Endocrine: Negative.   Genitourinary: Negative.   Musculoskeletal: Positive for arthralgias.       Chronic right shoulder pain  Skin: Negative.   Neurological: Positive for light-headedness and headaches. Negative for dizziness, tremors, syncope, facial asymmetry, speech difficulty, weakness and numbness.  Psychiatric/Behavioral: Negative.    Allergies  Penicillins; Gabapentin; and  Lyrica  Home Medications   Prior to Admission medications   Medication Sig Start Date End Date Taking? Authorizing Provider  ergocalciferol (VITAMIN D2) 50000 UNITS capsule Take 1 capsule (50,000 Units total) by mouth every 30 (thirty) days. 11/25/14  Yes Aleksei Plotnikov V, MD  furosemide (LASIX) 20 MG tablet Take 1-2 tablets (20-40 mg total) by mouth daily as needed for fluid or edema. 01/29/15  Yes Aleksei Plotnikov V, MD  indomethacin (INDOCIN) 50 MG capsule Take 1 capsule (50 mg total) by mouth 3 (three) times daily as needed for moderate pain. 03/14/15  Yes Aleksei Plotnikov V, MD  Ketoprofen 10 % CREA APPLY 1-3G DAILY THREE TIMES DAILY. 03/03/15  Yes Aleksei Plotnikov V, MD  loratadine (CLARITIN) 10 MG tablet Take 1 tablet (10 mg total) by mouth daily. 01/29/15  Yes Aleksei Plotnikov V, MD  morphine (AVINZA) 60 MG 24 hr capsule Take 1 capsule (60 mg total) by mouth daily. Please fill on or after 05/06/15 03/03/15  Yes Aleksei Plotnikov V, MD  Olmesartan-Amlodipine-HCTZ (TRIBENZOR) 40-10-25 MG TABS Take 1 tablet by mouth 1 day or 1 dose. 11/25/14  Yes Aleksei Plotnikov V, MD  omeprazole (PRILOSEC OTC) 20 MG tablet Take 1 tablet (20 mg total) by mouth daily. 03/14/15  Yes Aleksei Plotnikov V, MD  ondansetron (ZOFRAN) 4 MG tablet Take 1 tablet (4 mg total) by mouth every 8 (eight) hours as needed for nausea or vomiting. 03/14/15  Yes Aleksei Plotnikov V, MD  pseudoephedrine (SUDAFED) 120 MG 12 hr tablet Take 1 tablet (120 mg total) by mouth 2 (two) times daily as needed for congestion. 08/20/13  Yes Aleksei Plotnikov V, MD  SUMAtriptan (IMITREX) 100 MG tablet Take 1 tablet (100 mg total) by mouth every 2 (two) hours as needed for migraine or headache. May repeat in 2 hours if headache persists or recurs. 03/14/15  Yes Aleksei Plotnikov V, MD  pantoprazole (PROTONIX) 20 MG tablet Take 1 tablet (20 mg total) by mouth daily. 03/31/15   Delsa Grana, PA-C   BP 122/77 mmHg  Pulse 69  Temp(Src) 98.3 F (36.8  C) (Oral)  Resp 17  Ht 5\' 4"  (1.626 m)  Wt 165 lb (74.844 kg)  BMI 28.31 kg/m2  SpO2 100% Physical Exam  Constitutional: She is oriented to person, place, and time. She appears well-developed and well-nourished. No distress.  Well appearing female, NAD, non-toxic appearing  HENT:  Head: Normocephalic and atraumatic.  Nose: Nose normal.  Mouth/Throat: Oropharynx is clear and moist. No oropharyngeal exudate.  Eyes: Conjunctivae and EOM are normal. Pupils are equal, round, and reactive to light. Right eye exhibits no discharge. Left eye exhibits no discharge. No scleral icterus.  Neck: Normal range of motion. No JVD present. No tracheal deviation present. No thyromegaly present.  Cardiovascular: Normal rate, regular rhythm, normal heart sounds and intact distal pulses.  Exam reveals no gallop and no friction rub.   No murmur heard. Mild LE edema, non-pitting  Pulmonary/Chest:  Effort normal and breath sounds normal. No respiratory distress. She has no wheezes. She has no rales. She exhibits no tenderness.  Abdominal: Soft. Bowel sounds are normal. She exhibits no distension and no mass. There is no tenderness. There is no rebound and no guarding.  Musculoskeletal: Normal range of motion. She exhibits no edema or tenderness.  Lymphadenopathy:    She has no cervical adenopathy.  Neurological: She is alert and oriented to person, place, and time. She has normal reflexes. No cranial nerve deficit. She exhibits normal muscle tone. Coordination normal.  Skin: Skin is warm and dry. No rash noted. She is not diaphoretic. No erythema. No pallor.  Psychiatric: She has a normal mood and affect. Her behavior is normal. Judgment and thought content normal.  Nursing note and vitals reviewed.   ED Course  Procedures (including critical care time) Labs Review Labs Reviewed  BASIC METABOLIC PANEL - Abnormal; Notable for the following:    Potassium 3.2 (*)    Chloride 100 (*)    All other components  within normal limits  CBC  I-STAT TROPOININ, ED    Imaging Review Dg Chest 2 View  03/31/2015   CLINICAL DATA:  Chest pain 1 day.  EXAM: CHEST  2 VIEW  COMPARISON:  08/14/2013  FINDINGS: Lungs are adequately inflated without consolidation or effusion. Stable borderline cardiomegaly. Minimal degenerative change of the spine.  IMPRESSION: No active cardiopulmonary disease.   Electronically Signed   By: Marin Olp M.D.   On: 03/31/2015 16:50   I, Delsa Grana, personally reviewed and evaluated these images and lab results as part of my medical decision-making.   EKG Interpretation None      MDM   Final diagnoses:  Chest pain, unspecified chest pain type     Intermittent chest pain x 3 d Patient complains of dysphagia but was able to successfully take any potassium tablets with water without any emesis Complains of intermittent lightheadedness, without any overt signs of dehydration, negative orthostatics Labs unremarkable except for mild hypokalemia, likely secondary to lasix use, troponin negative Negative chest x-ray, lungs are clear to auscultation on exam Pt's sx most suspicious for GERD/esophogitis - given an alternate rx - protonix, instructed to f/up with PCP.   Pt discharged in stable condition -  Filed Vitals:   03/31/15 1830 03/31/15 1900 03/31/15 1930 03/31/15 2000  BP: 122/77 124/88 107/67 113/84  Pulse: 69 71 86 79  Temp:      TempSrc:      Resp: 17 12 15 12   Height:      Weight:      SpO2: 100% 100% 100% 100%      Delsa Grana, PA-C 04/03/15 0143  Charlesetta Shanks, MD 04/03/15 0730

## 2015-03-31 NOTE — ED Notes (Signed)
Pt reports 3 days of left side chest pain and sob, denies n/v. Also has headache and feeling lightheaded. ekg done at triage.

## 2015-04-07 ENCOUNTER — Ambulatory Visit (INDEPENDENT_AMBULATORY_CARE_PROVIDER_SITE_OTHER): Payer: Federal, State, Local not specified - PPO | Admitting: Internal Medicine

## 2015-04-07 ENCOUNTER — Encounter: Payer: Self-pay | Admitting: Internal Medicine

## 2015-04-07 VITALS — BP 120/78 | HR 78 | Wt 162.0 lb

## 2015-04-07 DIAGNOSIS — I889 Nonspecific lymphadenitis, unspecified: Secondary | ICD-10-CM | POA: Diagnosis not present

## 2015-04-07 DIAGNOSIS — K219 Gastro-esophageal reflux disease without esophagitis: Secondary | ICD-10-CM

## 2015-04-07 DIAGNOSIS — R0789 Other chest pain: Secondary | ICD-10-CM | POA: Diagnosis not present

## 2015-04-07 DIAGNOSIS — M5010 Cervical disc disorder with radiculopathy, unspecified cervical region: Secondary | ICD-10-CM

## 2015-04-07 MED ORDER — POTASSIUM CHLORIDE ER 10 MEQ PO TBCR: 10.0000 meq | EXTENDED_RELEASE_TABLET | Freq: Two times a day (BID) | ORAL | Status: DC

## 2015-04-07 MED ORDER — PANTOPRAZOLE SODIUM 20 MG PO TBEC: 20.0000 mg | DELAYED_RELEASE_TABLET | Freq: Every day | ORAL | Status: DC

## 2015-04-07 NOTE — Assessment & Plan Note (Addendum)
8/17 resolved - s/p ER eval - records/tests reviewed - poss GERD Protonix qd

## 2015-04-07 NOTE — Progress Notes (Signed)
Pre visit review using our clinic review tool, if applicable. No additional management support is needed unless otherwise documented below in the visit note. 

## 2015-04-07 NOTE — Assessment & Plan Note (Signed)
8/16 Protonix po

## 2015-04-07 NOTE — Assessment & Plan Note (Signed)
R axilla mass - not better x 1+ month: pt is worried. Abx did not help Surgical ref CBC was nl

## 2015-04-07 NOTE — Progress Notes (Signed)
Subjective:  Patient ID: Karina Newman, female    DOB: [birth year]  Age: 59 y.o. MRN: [id number]  CC: No chief complaint on file.   HPI Karina Newman presents for [facility] ER visit on 8/15 (reviewed). C/o R axilla mass - not better x 1+ month: pt is worried. Abx did not help  Outpatient Prescriptions Prior to Visit  Medication Sig Dispense Refill  . ergocalciferol (VITAMIN D2) 50000 UNITS capsule Take 1 capsule (50,000 Units total) by mouth every 30 (thirty) days. 6 capsule 3  . furosemide (LASIX) 20 MG tablet Take 1-2 tablets (20-40 mg total) by mouth daily as needed for fluid or edema. 60 tablet 5  . indomethacin (INDOCIN) 50 MG capsule Take 1 capsule (50 mg total) by mouth 3 (three) times daily as needed for moderate pain. 30 capsule 1  . Ketoprofen 10 % CREA APPLY 1-3G DAILY THREE TIMES DAILY. 120 g 1  . loratadine (CLARITIN) 10 MG tablet Take 1 tablet (10 mg total) by mouth daily. 100 tablet 3  . morphine (AVINZA) 60 MG 24 hr capsule Take 1 capsule (60 mg total) by mouth daily. Please fill on or after 05/06/15 30 capsule 0  . Olmesartan-Amlodipine-HCTZ (TRIBENZOR) 40-10-25 MG TABS Take 1 tablet by mouth 1 day or 1 dose. 90 tablet 3  . omeprazole (PRILOSEC OTC) 20 MG tablet Take 1 tablet (20 mg total) by mouth daily. 30 tablet 0  . ondansetron (ZOFRAN) 4 MG tablet Take 1 tablet (4 mg total) by mouth every 8 (eight) hours as needed for nausea or vomiting. 20 tablet 0  . pantoprazole (PROTONIX) 20 MG tablet Take 1 tablet (20 mg total) by mouth daily. 14 tablet 0  . pseudoephedrine (SUDAFED) 120 MG 12 hr tablet Take 1 tablet (120 mg total) by mouth 2 (two) times daily as needed for congestion. 30 tablet 1  . SUMAtriptan (IMITREX) 100 MG tablet Take 1 tablet (100 mg total) by mouth every 2 (two) hours as needed for migraine or headache. May repeat in 2 hours if headache persists or recurs. 10 tablet 2   No facility-administered medications prior to visit.    ROS Review of Systems    Constitutional: Negative for chills, activity change, appetite change, fatigue and unexpected weight change.  HENT: Negative for congestion, mouth sores and sinus pressure.   Eyes: Negative for visual disturbance.  Respiratory: Negative for cough and chest tightness.   Gastrointestinal: Negative for nausea and abdominal pain.  Genitourinary: Negative for frequency, difficulty urinating and vaginal pain.  Musculoskeletal: Positive for neck pain and neck stiffness. Negative for back pain and gait problem.  Skin: Negative for pallor and rash.  Neurological: Negative for dizziness, tremors, weakness, numbness and headaches.  Psychiatric/Behavioral: Negative for confusion and sleep disturbance.    Objective:  BP 120/78 mmHg  Pulse 78  Wt 162 lb (73.483 kg)  SpO2 98%  BP Readings from Last 3 Encounters:  04/07/15 120/78  03/31/15 113/84  03/14/15 130/82    Wt Readings from Last 3 Encounters:  04/07/15 162 lb (73.483 kg)  03/31/15 165 lb (74.844 kg)  03/14/15 165 lb (74.844 kg)    Physical Exam  Constitutional: She appears well-developed. No distress.  HENT:  Head: Normocephalic.  Right Ear: External ear normal.  Left Ear: External ear normal.  Nose: Nose normal.  Mouth/Throat: Oropharynx is clear and moist.  Eyes: Conjunctivae are normal. Pupils are equal, round, and reactive to light. Right eye exhibits no discharge. Left eye exhibits no discharge.  Neck:  Normal range of motion. Neck supple. No JVD present. No tracheal deviation present. No thyromegaly present.  Cardiovascular: Normal rate, regular rhythm and normal heart sounds.   Pulmonary/Chest: No stridor. No respiratory distress. She has no wheezes.  Abdominal: Soft. Bowel sounds are normal. She exhibits no distension and no mass. There is no tenderness. There is no rebound and no guarding.  Musculoskeletal: She exhibits no edema or tenderness.  Lymphadenopathy:    She has no cervical adenopathy.  Neurological: She  displays normal reflexes. No cranial nerve deficit. She exhibits normal muscle tone. Coordination normal.  Skin: No rash noted. No erythema.  Psychiatric: She has a normal mood and affect. Her behavior is normal. Judgment and thought content normal.   Tende R axilla superficial tender <1cm nodule Lab Results  Component Value Date   WBC 5.7 03/31/2015   HGB 13.7 03/31/2015   HCT 40.2 03/31/2015   PLT 385 03/31/2015   GLUCOSE 88 03/31/2015   CHOL 152 07/03/2013   TRIG 121.0 07/03/2013   HDL 39.60 07/03/2013   LDLCALC 88 07/03/2013   ALT 27 07/03/2013   AST 23 07/03/2013   NA 138 03/31/2015   K 3.2* 03/31/2015   CL 100* 03/31/2015   CREATININE 0.88 03/31/2015   BUN 10 03/31/2015   CO2 29 03/31/2015   TSH 0.92 07/31/2014    Dg Chest 2 View  03/31/2015   CLINICAL DATA:  Chest pain 1 day.  EXAM: CHEST  2 VIEW  COMPARISON:  08/14/2013  FINDINGS: Lungs are adequately inflated without consolidation or effusion. Stable borderline cardiomegaly. Minimal degenerative change of the spine.  IMPRESSION: No active cardiopulmonary disease.   Electronically Signed   By: Marin Olp M.D.   On: 03/31/2015 16:50    Assessment & Plan:   There are no diagnoses linked to this encounter. I am having Ms. Chirino maintain her pseudoephedrine, Olmesartan-Amlodipine-HCTZ, ergocalciferol, furosemide, loratadine, Ketoprofen, morphine, ondansetron, SUMAtriptan, omeprazole, indomethacin, and pantoprazole.  No orders of the defined types were placed in this encounter.     Follow-up: No Follow-up on file.  Walker Kehr, MD

## 2015-04-07 NOTE — Assessment & Plan Note (Signed)
On Rx 

## 2015-04-29 ENCOUNTER — Other Ambulatory Visit: Payer: Self-pay | Admitting: Internal Medicine

## 2015-05-15 ENCOUNTER — Telehealth: Payer: Self-pay | Admitting: *Deleted

## 2015-05-15 NOTE — Telephone Encounter (Signed)
Patient called you back.

## 2015-05-15 NOTE — Telephone Encounter (Signed)
Pt dropped off a form from [employer] stating approval for her compounded drugs will only be considered when the treating physician completes a new form that will be called CA-26(Letter of Medical Necessity for compounded drugs) which is available on the [employer]. In order to complete the forms, the provider must be "enrolled". The website is on the letter which we be sent to be scanned in under our "media" tab in Epic. The PCP wants to know which drug this applies to that HE prescribes.   I called pt and Left mess for patient to call back to see which med this is for and I advised that the website required numerous information about presciber and our medical facility and I wasn't sure if we could complete this for her. More info is needed at this time. Specifically, what compounded medication does Dr. Alain Marion prescribe her that requires this?

## 2015-05-20 NOTE — Telephone Encounter (Signed)
I called pt- she states the form is for Ketoprophen. I looked at the website and it states the "provider must be enrolled", which required a lot of information about PCP and his practice. The Ketoprofen was originally rx'd by Dr. Tamala Julian. I am placing form back in PCP's red folder for further advisement.

## 2015-05-20 NOTE — Telephone Encounter (Signed)
If this Rx came from Dr Tamala Julian, pls ask Dr Jaquita Rector

## 2015-05-26 NOTE — Telephone Encounter (Signed)
Spoke to Dr. Tamala Julian, he stated that her insurance is not going to cover this med & he recommends voltaren.

## 2015-05-26 NOTE — Telephone Encounter (Signed)
Form given to [place], [place] for Dr. Tamala Julian. Ria Comment, I see Dr. Alain Marion gave her this last. Is Dr. Tamala Julian willing to complete the steps required on that form?

## 2015-06-02 ENCOUNTER — Ambulatory Visit: Payer: Self-pay | Admitting: Internal Medicine

## 2015-06-11 ENCOUNTER — Ambulatory Visit (INDEPENDENT_AMBULATORY_CARE_PROVIDER_SITE_OTHER): Admitting: Internal Medicine

## 2015-06-11 ENCOUNTER — Encounter: Payer: Self-pay | Admitting: Internal Medicine

## 2015-06-11 VITALS — BP 130/82 | HR 110 | Wt 163.0 lb

## 2015-06-11 DIAGNOSIS — M25512 Pain in left shoulder: Secondary | ICD-10-CM | POA: Diagnosis not present

## 2015-06-11 DIAGNOSIS — M25511 Pain in right shoulder: Secondary | ICD-10-CM | POA: Diagnosis not present

## 2015-06-11 DIAGNOSIS — M5010 Cervical disc disorder with radiculopathy, unspecified cervical region: Secondary | ICD-10-CM

## 2015-06-11 DIAGNOSIS — M7551 Bursitis of right shoulder: Secondary | ICD-10-CM | POA: Diagnosis not present

## 2015-06-11 MED ORDER — OXYCODONE HCL 15 MG PO TABS: 15.0000 mg | ORAL_TABLET | Freq: Two times a day (BID) | ORAL | Status: DC | PRN

## 2015-06-11 MED ORDER — MORPHINE SULFATE ER BEADS 60 MG PO CP24: 60.0000 mg | ORAL_CAPSULE | Freq: Every day | ORAL | Status: DC

## 2015-06-11 MED ORDER — KETOPROFEN 10 % CREA: TOPICAL_CREAM | Status: DC

## 2015-06-11 MED ORDER — KETOPROFEN 10 % CREA
TOPICAL_CREAM | Status: DC
Start: 2015-06-11 — End: 2015-07-07

## 2015-06-11 NOTE — Assessment & Plan Note (Signed)
On Avinza/OXY  Potential benefits of a long term opioids use as well as potential risks (i.e. addiction risk, apnea etc) and complications (i.e. Somnolence, constipation and others) were explained to the patient and were aknowledged. 

## 2015-06-11 NOTE — Progress Notes (Signed)
Subjective:  Patient ID: Karina Newman, female    DOB: Jul 19, 1956  Age: 59 y.o. MRN: [id number]  CC: No chief complaint on file.   HPI Karina Newman presents for chronic shoulder pain and cervical radiculopathy f/u. Pt needs a compound cream Ketoprofen 10%  Outpatient Prescriptions Prior to Visit  Medication Sig Dispense Refill  . furosemide (LASIX) 20 MG tablet Take 1-2 tablets (20-40 mg total) by mouth daily as needed for fluid or edema. 60 tablet 5  . indomethacin (INDOCIN) 50 MG capsule Take 1 capsule (50 mg total) by mouth 3 (three) times daily as needed for moderate pain. 30 capsule 1  . loratadine (CLARITIN) 10 MG tablet Take 1 tablet (10 mg total) by mouth daily. 100 tablet 3  . Olmesartan-Amlodipine-HCTZ (TRIBENZOR) 40-10-25 MG TABS Take 1 tablet by mouth 1 day or 1 dose. 90 tablet 3  . omeprazole (PRILOSEC OTC) 20 MG tablet Take 1 tablet (20 mg total) by mouth daily. 30 tablet 0  . ondansetron (ZOFRAN) 4 MG tablet Take 1 tablet (4 mg total) by mouth every 8 (eight) hours as needed for nausea or vomiting. 20 tablet 0  . pantoprazole (PROTONIX) 20 MG tablet Take 1 tablet (20 mg total) by mouth daily. 30 tablet 11  . potassium chloride (KLOR-CON 10) 10 MEQ tablet Take 1 tablet (10 mEq total) by mouth 2 (two) times daily. 60 tablet 1  . pseudoephedrine (SUDAFED) 120 MG 12 hr tablet Take 1 tablet (120 mg total) by mouth 2 (two) times daily as needed for congestion. 30 tablet 1  . SUMAtriptan (IMITREX) 100 MG tablet Take 1 tablet (100 mg total) by mouth every 2 (two) hours as needed for migraine or headache. May repeat in 2 hours if headache persists or recurs. 10 tablet 2  . Vitamin D, Ergocalciferol, (DRISDOL) 50000 UNITS CAPS capsule TAKE ONE CAPSULE BY MOUTH EVERY 30 DAYS 6 capsule 2  . Ketoprofen 10 % CREA APPLY 1-3G DAILY THREE TIMES DAILY. 120 g 1  . morphine (AVINZA) 60 MG 24 hr capsule Take 1 capsule (60 mg total) by mouth daily. Please fill on or after 05/06/15 30 capsule 0    No facility-administered medications prior to visit.    ROS Review of Systems  Constitutional: Negative for chills, activity change, appetite change, fatigue and unexpected weight change.  HENT: Negative for congestion, mouth sores and sinus pressure.   Eyes: Negative for visual disturbance.  Respiratory: Negative for cough and chest tightness.   Gastrointestinal: Negative for nausea and abdominal pain.  Genitourinary: Negative for frequency, difficulty urinating and vaginal pain.  Musculoskeletal: Positive for arthralgias, neck pain and neck stiffness. Negative for back pain and gait problem.  Skin: Negative for pallor and rash.  Neurological: Negative for dizziness, tremors, weakness, numbness and headaches.  Psychiatric/Behavioral: Negative for suicidal ideas, confusion and sleep disturbance. The patient is not nervous/anxious.     Objective:  BP 130/82 mmHg  Pulse 110  Wt 163 lb (73.936 kg)  SpO2 96%  BP Readings from Last 3 Encounters:  06/11/15 130/82  04/07/15 120/78  03/31/15 113/84    Wt Readings from Last 3 Encounters:  06/11/15 163 lb (73.936 kg)  04/07/15 162 lb (73.483 kg)  03/31/15 165 lb (74.844 kg)    Physical Exam  Constitutional: She appears well-developed. No distress.  HENT:  Head: Normocephalic.  Right Ear: External ear normal.  Left Ear: External ear normal.  Nose: Nose normal.  Mouth/Throat: Oropharynx is clear and moist.  Eyes: Conjunctivae are  normal. Pupils are equal, round, and reactive to light. Right eye exhibits no discharge. Left eye exhibits no discharge.  Neck: Normal range of motion. Neck supple. No JVD present. No tracheal deviation present. No thyromegaly present.  Cardiovascular: Normal rate, regular rhythm and normal heart sounds.   Pulmonary/Chest: No stridor. No respiratory distress. She has no wheezes.  Abdominal: Soft. Bowel sounds are normal. She exhibits no distension and no mass. There is no tenderness. There is no  rebound and no guarding.  Musculoskeletal: She exhibits tenderness. She exhibits no edema.  Lymphadenopathy:    She has no cervical adenopathy.  Neurological: She displays normal reflexes. No cranial nerve deficit. She exhibits normal muscle tone. Coordination normal.  Skin: No rash noted. No erythema.  Psychiatric: She has a normal mood and affect. Her behavior is normal. Judgment and thought content normal.    Lab Results  Component Value Date   WBC 5.7 03/31/2015   HGB 13.7 03/31/2015   HCT 40.2 03/31/2015   PLT 385 03/31/2015   GLUCOSE 88 03/31/2015   CHOL 152 07/03/2013   TRIG 121.0 07/03/2013   HDL 39.60 07/03/2013   LDLCALC 88 07/03/2013   ALT 27 07/03/2013   AST 23 07/03/2013   NA 138 03/31/2015   K 3.2* 03/31/2015   CL 100* 03/31/2015   CREATININE 0.88 03/31/2015   BUN 10 03/31/2015   CO2 29 03/31/2015   TSH 0.92 07/31/2014    Dg Chest 2 View  03/31/2015  CLINICAL DATA:  Chest pain 1 day. EXAM: CHEST  2 VIEW COMPARISON:  08/14/2013 FINDINGS: Lungs are adequately inflated without consolidation or effusion. Stable borderline cardiomegaly. Minimal degenerative change of the spine. IMPRESSION: No active cardiopulmonary disease. Electronically Signed   By: Marin Olp M.D.   On: 03/31/2015 16:50    Assessment & Plan:   Diagnoses and all orders for this visit:  Cervical disc disorder with radiculopathy of cervical region  Bursitis of right shoulder  Bilateral shoulder pain  Other orders -     Discontinue: morphine (AVINZA) 60 MG 24 hr capsule; Take 1 capsule (60 mg total) by mouth daily. Please fill on or after 05/06/15 -     Discontinue: oxyCODONE (ROXICODONE) 15 MG immediate release tablet; Take 1 tablet (15 mg total) by mouth 2 (two) times daily as needed. -     Discontinue: Ketoprofen 10 % CREA; APPLY 1-3G DAILY THREE TIMES DAILY. -     Ketoprofen 10 % CREA; APPLY 1-3G DAILY THREE TIMES DAILY. -     Discontinue: oxyCODONE (ROXICODONE) 15 MG immediate release  tablet; Take 1 tablet (15 mg total) by mouth 2 (two) times daily as needed. -     Discontinue: morphine (AVINZA) 60 MG 24 hr capsule; Take 1 capsule (60 mg total) by mouth daily. Please fill on or after 06/11/15 -     Discontinue: oxyCODONE (ROXICODONE) 15 MG immediate release tablet; Take 1 tablet (15 mg total) by mouth 2 (two) times daily as needed. -     Discontinue: morphine (AVINZA) 60 MG 24 hr capsule; Take 1 capsule (60 mg total) by mouth daily. Please fill on or after 07/12/15 -     morphine (AVINZA) 60 MG 24 hr capsule; Take 1 capsule (60 mg total) by mouth daily. Please fill on or after 08/11/15 -     oxyCODONE (ROXICODONE) 15 MG immediate release tablet; Take 1 tablet (15 mg total) by mouth 2 (two) times daily as needed.  I have discontinued Ms. Gut's morphine,  morphine, and morphine. I have also changed her morphine. Additionally, I am having her maintain her pseudoephedrine, Olmesartan-Amlodipine-HCTZ, furosemide, loratadine, ondansetron, SUMAtriptan, omeprazole, indomethacin, pantoprazole, potassium chloride, Vitamin D (Ergocalciferol), Ketoprofen, and oxyCODONE.  Meds ordered this encounter  Medications  . DISCONTD: oxyCODONE (ROXICODONE) 15 MG immediate release tablet    Sig: Take 1 tablet by mouth 2 (two) times daily as needed.    Refill:  0  . DISCONTD: morphine (AVINZA) 60 MG 24 hr capsule    Sig: Take 1 capsule (60 mg total) by mouth daily. Please fill on or after 05/06/15    Dispense:  30 capsule    Refill:  0  . DISCONTD: oxyCODONE (ROXICODONE) 15 MG immediate release tablet    Sig: Take 1 tablet (15 mg total) by mouth 2 (two) times daily as needed.    Dispense:  30 tablet    Refill:  0  . DISCONTD: Ketoprofen 10 % CREA    Sig: APPLY 1-3G DAILY THREE TIMES DAILY.    Dispense:  120 g    Refill:  3  . Ketoprofen 10 % CREA    Sig: APPLY 1-3G DAILY THREE TIMES DAILY.    Dispense:  120 g    Refill:  3  . DISCONTD: oxyCODONE (ROXICODONE) 15 MG immediate release tablet     Sig: Take 1 tablet (15 mg total) by mouth 2 (two) times daily as needed.    Dispense:  30 tablet    Refill:  0    Please fill on or after 06/11/15  . DISCONTD: morphine (AVINZA) 60 MG 24 hr capsule    Sig: Take 1 capsule (60 mg total) by mouth daily. Please fill on or after 06/11/15    Dispense:  30 capsule    Refill:  0  . DISCONTD: oxyCODONE (ROXICODONE) 15 MG immediate release tablet    Sig: Take 1 tablet (15 mg total) by mouth 2 (two) times daily as needed.    Dispense:  30 tablet    Refill:  0    Please fill on or after 07/12/15  . DISCONTD: morphine (AVINZA) 60 MG 24 hr capsule    Sig: Take 1 capsule (60 mg total) by mouth daily. Please fill on or after 07/12/15    Dispense:  30 capsule    Refill:  0  . morphine (AVINZA) 60 MG 24 hr capsule    Sig: Take 1 capsule (60 mg total) by mouth daily. Please fill on or after 08/11/15    Dispense:  30 capsule    Refill:  0  . oxyCODONE (ROXICODONE) 15 MG immediate release tablet    Sig: Take 1 tablet (15 mg total) by mouth 2 (two) times daily as needed.    Dispense:  30 tablet    Refill:  0    Please fill on or after 08/11/15     Follow-up: Return in about 3 months (around 09/11/2015) for a follow-up visit.  Walker Kehr, MD

## 2015-06-11 NOTE — Progress Notes (Signed)
Pre visit review using our clinic review tool, if applicable. No additional management support is needed unless otherwise documented below in the visit note. 

## 2015-06-11 NOTE — Assessment & Plan Note (Addendum)
On Avinza/OXY  Potential benefits of a long term opioids use as well as potential risks (i.e. addiction risk, apnea etc) and complications (i.e. Somnolence, constipation and others) were explained to the patient and were aknowledged.   Pt needs a Ketoprofen 10% compound cream - it helps to use less Avinza/Oxy

## 2015-06-12 DIAGNOSIS — Z7689 Persons encountering health services in other specified circumstances: Secondary | ICD-10-CM

## 2015-06-25 NOTE — Telephone Encounter (Signed)
Authorization and medical necessity form for compounded drugs (ketoprofen 10% cream)  Completed by PCP and faxed on 06/17/15.

## 2015-07-07 ENCOUNTER — Other Ambulatory Visit: Payer: Self-pay | Admitting: *Deleted

## 2015-07-07 MED ORDER — KETOPROFEN 10 % CREA
TOPICAL_CREAM | Status: DC
Start: 2015-07-07 — End: 2016-03-22

## 2015-07-08 ENCOUNTER — Telehealth: Payer: Self-pay

## 2015-07-08 NOTE — Telephone Encounter (Signed)
Ketoprofen 10% is a compound and needs a PA.   Can you tell me a little about why it is needed so I can start this PA?

## 2015-07-09 NOTE — Telephone Encounter (Signed)
Okay - thank you  I am closing note regarding this.

## 2015-07-09 NOTE — Telephone Encounter (Signed)
I filled out the PA paperwork 2-3 wks ago and gave a copy to pt Thx

## 2015-07-16 MED ORDER — DICLOFENAC SODIUM 1 % TD GEL: 2.0000 g | Freq: Four times a day (QID) | TRANSDERMAL | Status: DC

## 2015-07-16 NOTE — Telephone Encounter (Signed)
Pt has called back regarding the necessity form for the Ketoprofen for workers comp. They haven't received it. Workers Comp gave her a phone number [phone] for [place] to call   If not able to get this can he prescription, can she get another cream?

## 2015-07-16 NOTE — Telephone Encounter (Signed)
Yes: Voltaren gel Thx

## 2015-07-16 NOTE — Addendum Note (Signed)
Addended by: Cassandria Anger on: 07/16/2015 04:54 PM   Modules accepted: Orders

## 2015-07-16 NOTE — Telephone Encounter (Signed)
Are there alternatives she can use?

## 2015-07-16 NOTE — Telephone Encounter (Signed)
rx faxed

## 2015-07-16 NOTE — Telephone Encounter (Signed)
Spoke to pt. Pt stated that the voltaren gel makes her break out in hives.

## 2015-07-17 NOTE — Telephone Encounter (Signed)
Called the workers comp number and also tried to access the portal via the website without success.   Pt stated the voltaren cause her to break out into hives. Is there another option?

## 2015-07-17 NOTE — Addendum Note (Signed)
Addended by: Karle Barr on: 07/17/2015 09:33 AM   Modules accepted: Orders, Medications

## 2015-07-25 NOTE — Telephone Encounter (Signed)
I called pt- she states she can not try Voltaren or Fentanyl patches. She is willing to try anything else that you feel may work. She is taking more pain pills than she had been without the ketoprofen. Please advise.

## 2015-07-25 NOTE — Telephone Encounter (Signed)
Pt called in and said she would like nurse to call her about this med??   Best number [phone]

## 2015-07-28 MED ORDER — KETOPROFEN 50 MG PO CAPS: 50.0000 mg | ORAL_CAPSULE | Freq: Three times a day (TID) | ORAL | Status: DC | PRN

## 2015-07-28 NOTE — Addendum Note (Signed)
Addended by: Cassandria Anger on: 07/28/2015 07:47 AM   Modules accepted: Orders, Medications

## 2015-07-28 NOTE — Addendum Note (Signed)
Addended by: Cresenciano Lick on: 07/28/2015 11:44 AM   Modules accepted: Orders

## 2015-07-28 NOTE — Telephone Encounter (Signed)
Pt informed . Rx sent. See meds.

## 2015-07-28 NOTE — Telephone Encounter (Signed)
Noted - we can use Ketoprofen po. Use Zostrix OTC cream as directed Thx

## 2015-07-31 ENCOUNTER — Encounter: Payer: Self-pay | Admitting: Internal Medicine

## 2015-07-31 ENCOUNTER — Ambulatory Visit (INDEPENDENT_AMBULATORY_CARE_PROVIDER_SITE_OTHER): Payer: Federal, State, Local not specified - PPO | Admitting: Internal Medicine

## 2015-07-31 VITALS — BP 120/80 | HR 105 | Wt 167.0 lb

## 2015-07-31 DIAGNOSIS — K219 Gastro-esophageal reflux disease without esophagitis: Secondary | ICD-10-CM

## 2015-07-31 DIAGNOSIS — G4700 Insomnia, unspecified: Secondary | ICD-10-CM

## 2015-07-31 DIAGNOSIS — M5010 Cervical disc disorder with radiculopathy, unspecified cervical region: Secondary | ICD-10-CM | POA: Diagnosis not present

## 2015-07-31 DIAGNOSIS — M7551 Bursitis of right shoulder: Secondary | ICD-10-CM | POA: Diagnosis not present

## 2015-07-31 MED ORDER — AMLODIPINE BESYLATE 10 MG PO TABS: 10.0000 mg | ORAL_TABLET | Freq: Every day | ORAL | Status: DC

## 2015-07-31 MED ORDER — OXYCODONE HCL 15 MG PO TABS: 15.0000 mg | ORAL_TABLET | Freq: Two times a day (BID) | ORAL | Status: DC | PRN

## 2015-07-31 MED ORDER — TELMISARTAN-HCTZ 80-25 MG PO TABS: 1.0000 | ORAL_TABLET | Freq: Every day | ORAL | Status: DC

## 2015-07-31 NOTE — Assessment & Plan Note (Signed)
Chronic pain - see Rx 

## 2015-07-31 NOTE — Assessment & Plan Note (Signed)
Doing fair 

## 2015-07-31 NOTE — Progress Notes (Signed)
Subjective:  Patient ID: Karina Newman, female    DOB: [birth year]  Age: 59 y.o. MRN: [id number]  CC: No chief complaint on file.   HPI Karina Newman presents for chronic neck and shoulder pain f/u  (Oxy Rx was for 30 pills - error). Pt is using Ketoprofen PO (cream is not covered). F/u HTN - Tribenzor is too $$$  Outpatient Prescriptions Prior to Visit  Medication Sig Dispense Refill  . furosemide (LASIX) 20 MG tablet Take 1-2 tablets (20-40 mg total) by mouth daily as needed for fluid or edema. 60 tablet 5  . ketoprofen (ORUDIS) 50 MG capsule Take 1 capsule (50 mg total) by mouth every 8 (eight) hours as needed for moderate pain. Take pc 90 capsule 2  . Ketoprofen 10 % CREA APPLY 1-3G DAILY THREE TIMES DAILY. 120 g 3  . loratadine (CLARITIN) 10 MG tablet Take 1 tablet (10 mg total) by mouth daily. 100 tablet 3  . morphine (AVINZA) 60 MG 24 hr capsule Take 1 capsule (60 mg total) by mouth daily. Please fill on or after 08/11/15 30 capsule 0  . omeprazole (PRILOSEC OTC) 20 MG tablet Take 1 tablet (20 mg total) by mouth daily. 30 tablet 0  . ondansetron (ZOFRAN) 4 MG tablet Take 1 tablet (4 mg total) by mouth every 8 (eight) hours as needed for nausea or vomiting. 20 tablet 0  . pantoprazole (PROTONIX) 20 MG tablet Take 1 tablet (20 mg total) by mouth daily. 30 tablet 11  . potassium chloride (KLOR-CON 10) 10 MEQ tablet Take 1 tablet (10 mEq total) by mouth 2 (two) times daily. 60 tablet 1  . pseudoephedrine (SUDAFED) 120 MG 12 hr tablet Take 1 tablet (120 mg total) by mouth 2 (two) times daily as needed for congestion. 30 tablet 1  . SUMAtriptan (IMITREX) 100 MG tablet Take 1 tablet (100 mg total) by mouth every 2 (two) hours as needed for migraine or headache. May repeat in 2 hours if headache persists or recurs. 10 tablet 2  . Vitamin D, Ergocalciferol, (DRISDOL) 50000 UNITS CAPS capsule TAKE ONE CAPSULE BY MOUTH EVERY 30 DAYS 6 capsule 2  . Olmesartan-Amlodipine-HCTZ (TRIBENZOR) 40-10-25  MG TABS Take 1 tablet by mouth 1 day or 1 dose. 90 tablet 3  . oxyCODONE (ROXICODONE) 15 MG immediate release tablet Take 1 tablet (15 mg total) by mouth 2 (two) times daily as needed. 30 tablet 0   No facility-administered medications prior to visit.    ROS Review of Systems  Constitutional: Negative for chills, activity change, appetite change, fatigue and unexpected weight change.  HENT: Negative for congestion, mouth sores and sinus pressure.   Eyes: Negative for visual disturbance.  Respiratory: Negative for cough and chest tightness.   Gastrointestinal: Negative for nausea and abdominal pain.  Genitourinary: Negative for frequency, difficulty urinating and vaginal pain.  Musculoskeletal: Positive for myalgias, neck pain and neck stiffness. Negative for back pain and gait problem.  Skin: Negative for pallor and rash.  Neurological: Negative for dizziness, tremors, weakness, numbness and headaches.  Psychiatric/Behavioral: Negative for suicidal ideas, confusion and sleep disturbance.    Objective:  BP 120/80 mmHg  Pulse 105  Wt 167 lb (75.751 kg)  SpO2 95%  BP Readings from Last 3 Encounters:  07/31/15 120/80  06/11/15 130/82  04/07/15 120/78    Wt Readings from Last 3 Encounters:  07/31/15 167 lb (75.751 kg)  06/11/15 163 lb (73.936 kg)  04/07/15 162 lb (73.483 kg)    Physical Exam  Constitutional: She appears well-developed. No distress.  HENT:  Head: Normocephalic.  Right Ear: External ear normal.  Left Ear: External ear normal.  Nose: Nose normal.  Mouth/Throat: Oropharynx is clear and moist.  Eyes: Conjunctivae are normal. Pupils are equal, round, and reactive to light. Right eye exhibits no discharge. Left eye exhibits no discharge.  Neck: Normal range of motion. Neck supple. No JVD present. No tracheal deviation present. No thyromegaly present.  Cardiovascular: Normal rate, regular rhythm and normal heart sounds.   Pulmonary/Chest: No stridor. No  respiratory distress. She has no wheezes.  Abdominal: Soft. Bowel sounds are normal. She exhibits no distension and no mass. There is no tenderness. There is no rebound and no guarding.  Musculoskeletal: She exhibits tenderness. She exhibits no edema.  Lymphadenopathy:    She has no cervical adenopathy.  Neurological: She displays normal reflexes. No cranial nerve deficit. She exhibits normal muscle tone. Coordination normal.  Skin: No rash noted. No erythema.  Psychiatric: She has a normal mood and affect. Her behavior is normal. Judgment and thought content normal.    Lab Results  Component Value Date   WBC 5.7 03/31/2015   HGB 13.7 03/31/2015   HCT 40.2 03/31/2015   PLT 385 03/31/2015   GLUCOSE 88 03/31/2015   CHOL 152 07/03/2013   TRIG 121.0 07/03/2013   HDL 39.60 07/03/2013   LDLCALC 88 07/03/2013   ALT 27 07/03/2013   AST 23 07/03/2013   NA 138 03/31/2015   K 3.2* 03/31/2015   CL 100* 03/31/2015   CREATININE 0.88 03/31/2015   BUN 10 03/31/2015   CO2 29 03/31/2015   TSH 0.92 07/31/2014    Dg Chest 2 View  03/31/2015  CLINICAL DATA:  Chest pain 1 day. EXAM: CHEST  2 VIEW COMPARISON:  08/14/2013 FINDINGS: Lungs are adequately inflated without consolidation or effusion. Stable borderline cardiomegaly. Minimal degenerative change of the spine. IMPRESSION: No active cardiopulmonary disease. Electronically Signed   By: Marin Olp M.D.   On: 03/31/2015 16:50    Assessment & Plan:   Diagnoses and all orders for this visit:  Gastroesophageal reflux disease without esophagitis  Cervical disc disorder with radiculopathy of cervical region  Insomnia  Bursitis of right shoulder  Other orders -     Discontinue: oxyCODONE (ROXICODONE) 15 MG immediate release tablet; Take 1 tablet (15 mg total) by mouth 2 (two) times daily as needed. -     oxyCODONE (ROXICODONE) 15 MG immediate release tablet; Take 1 tablet (15 mg total) by mouth 2 (two) times daily as needed. -      telmisartan-hydrochlorothiazide (MICARDIS HCT) 80-25 MG tablet; Take 1 tablet by mouth daily. -     amLODipine (NORVASC) 10 MG tablet; Take 1 tablet (10 mg total) by mouth daily.   I have discontinued [name]'s Olmesartan-Amlodipine-HCTZ. I am also having her start on telmisartan-hydrochlorothiazide and amLODipine. Additionally, I am having her maintain her pseudoephedrine, furosemide, loratadine, ondansetron, SUMAtriptan, omeprazole, pantoprazole, potassium chloride, Vitamin D (Ergocalciferol), morphine, Ketoprofen, ketoprofen, and oxyCODONE.  Meds ordered this encounter  Medications  . DISCONTD: oxyCODONE (ROXICODONE) 15 MG immediate release tablet    Sig: Take 1 tablet (15 mg total) by mouth 2 (two) times daily as needed.    Dispense:  60 tablet    Refill:  0    Please fill on or after 08/05/15  . oxyCODONE (ROXICODONE) 15 MG immediate release tablet    Sig: Take 1 tablet (15 mg total) by mouth 2 (two) times daily as  needed.    Dispense:  60 tablet    Refill:  0    Please fill on or after 09/05/15  . telmisartan-hydrochlorothiazide (MICARDIS HCT) 80-25 MG tablet    Sig: Take 1 tablet by mouth daily.    Dispense:  90 tablet    Refill:  3  . amLODipine (NORVASC) 10 MG tablet    Sig: Take 1 tablet (10 mg total) by mouth daily.    Dispense:  90 tablet    Refill:  3     Follow-up: Return in about 2 months (around 10/01/2015) for a follow-up visit.  Walker Kehr, MD

## 2015-07-31 NOTE — Assessment & Plan Note (Signed)
On Avinza/OXY  Potential benefits of a long term opioids use as well as potential risks (i.e. addiction risk, apnea etc) and complications (i.e. Somnolence, constipation and others) were explained to the patient and were aknowledged. 

## 2015-07-31 NOTE — Progress Notes (Signed)
Pre visit review using our clinic review tool, if applicable. No additional management support is needed unless otherwise documented below in the visit note. 

## 2015-07-31 NOTE — Assessment & Plan Note (Signed)
Protonix po 

## 2015-08-30 ENCOUNTER — Ambulatory Visit (INDEPENDENT_AMBULATORY_CARE_PROVIDER_SITE_OTHER): Payer: Federal, State, Local not specified - PPO | Admitting: Internal Medicine

## 2015-08-30 ENCOUNTER — Encounter: Payer: Self-pay | Admitting: Internal Medicine

## 2015-08-30 VITALS — BP 160/92 | HR 92 | Temp 98.2°F | Resp 18 | Wt 166.0 lb

## 2015-08-30 DIAGNOSIS — J069 Acute upper respiratory infection, unspecified: Secondary | ICD-10-CM | POA: Diagnosis not present

## 2015-08-30 DIAGNOSIS — R042 Hemoptysis: Secondary | ICD-10-CM | POA: Diagnosis not present

## 2015-08-30 DIAGNOSIS — H65112 Acute and subacute allergic otitis media (mucoid) (sanguinous) (serous), left ear: Secondary | ICD-10-CM | POA: Diagnosis not present

## 2015-08-30 MED ORDER — LEVOFLOXACIN 500 MG PO TABS
500.0000 mg | ORAL_TABLET | Freq: Every day | ORAL | Status: DC
Start: 2015-08-30 — End: 2015-09-15

## 2015-08-30 NOTE — Assessment & Plan Note (Signed)
Mild to mod, for antibx course,  to f/u any worsening symptoms or concerns 

## 2015-08-30 NOTE — Patient Instructions (Signed)
Please take all new medication as prescribed - the antibiotic  You can also take Delsym OTC for cough, and/or Mucinex (or it's generic off brand) for congestion, and tylenol as needed for pain.  Please continue all other medications as before, and refills have been done if requested.  Please have the pharmacy call with any other refills you may need.  Please keep your appointments with your specialists as you may have planned    

## 2015-08-30 NOTE — Assessment & Plan Note (Signed)
Small volume, likely URI related as per pt thinking, declines cxr for now, to reconsider for any worsening

## 2015-08-30 NOTE — Assessment & Plan Note (Signed)
Likely source of off balance, mod, for antibx course, and mucinex otc prn, cont sudafed prn,  to f/u any worsening symptoms or concerns

## 2015-08-30 NOTE — Progress Notes (Signed)
Subjective:    Patient ID: Karina Newman, female    DOB: August 25, 1955, 60 y.o.   MRN: [id number]  HPI   Here with 2-3 days acute onset fever, facial pain, pressure, headache, general weakness and malaise, and greenish d/c,  without ST or significant cough, but pt denies chest pain, wheezing, increased sob or doe, orthopnea, PND, increased LE swelling, palpitations, dizziness or syncope. Did note several episodes small blood with cough, thinks mostly came from sinus,has worsening left ear pain, dizziness off  Balance to stand and walk, slipped in the shower today landing on right lateral hip and knee, not really injured she thinks as no swelling or bruising and walks ok.   Past Medical History  Diagnosis Date  . Thyroid disease     Hyperthyroid. Was on PTU (Dr Hampton Abbot in [place]) - stopped in 2013, stable  . Right shoulder pain 2001    chronic pain  . Hypertension   . Abnormal cervical Pap smear with positive HPV DNA test 01/2014    Normal cytology with positive high-risk HPV 18/45. Colposcopy negative with negative ECC   Past Surgical History  Procedure Laterality Date  . Cesarean section      x 4   . Shoulder surgery Right 2002  . Shoulder surgery Left 2014  . Hand surgery    . Tubal ligation      reports that she has never smoked. She does not have any smokeless tobacco history on file. She reports that she drinks alcohol. She reports that she does not use illicit drugs. family history includes Cancer in her father; Diabetes in her brother; Hypertension in her mother and sister; Kidney disease in her mother. Allergies  Allergen Reactions  . Penicillins Hives  . Gabapentin     Nausea   . Lyrica [Pregabalin] Dermatitis  . Voltaren [Diclofenac] Hives   Current Outpatient Prescriptions on File Prior to Visit  Medication Sig Dispense Refill  . amLODipine (NORVASC) 10 MG tablet Take 1 tablet (10 mg total) by mouth daily. 90 tablet 3  . furosemide (LASIX) 20 MG tablet Take 1-2 tablets (20-40  mg total) by mouth daily as needed for fluid or edema. 60 tablet 5  . ketoprofen (ORUDIS) 50 MG capsule Take 1 capsule (50 mg total) by mouth every 8 (eight) hours as needed for moderate pain. Take pc 90 capsule 2  . Ketoprofen 10 % CREA APPLY 1-3G DAILY THREE TIMES DAILY. 120 g 3  . loratadine (CLARITIN) 10 MG tablet Take 1 tablet (10 mg total) by mouth daily. 100 tablet 3  . morphine (AVINZA) 60 MG 24 hr capsule Take 1 capsule (60 mg total) by mouth daily. Please fill on or after 08/11/15 30 capsule 0  . omeprazole (PRILOSEC OTC) 20 MG tablet Take 1 tablet (20 mg total) by mouth daily. 30 tablet 0  . ondansetron (ZOFRAN) 4 MG tablet Take 1 tablet (4 mg total) by mouth every 8 (eight) hours as needed for nausea or vomiting. 20 tablet 0  . oxyCODONE (ROXICODONE) 15 MG immediate release tablet Take 1 tablet (15 mg total) by mouth 2 (two) times daily as needed. 60 tablet 0  . pantoprazole (PROTONIX) 20 MG tablet Take 1 tablet (20 mg total) by mouth daily. 30 tablet 11  . potassium chloride (KLOR-CON 10) 10 MEQ tablet Take 1 tablet (10 mEq total) by mouth 2 (two) times daily. 60 tablet 1  . pseudoephedrine (SUDAFED) 120 MG 12 hr tablet Take 1 tablet (120 mg total) by  mouth 2 (two) times daily as needed for congestion. 30 tablet 1  . SUMAtriptan (IMITREX) 100 MG tablet Take 1 tablet (100 mg total) by mouth every 2 (two) hours as needed for migraine or headache. May repeat in 2 hours if headache persists or recurs. 10 tablet 2  . telmisartan-hydrochlorothiazide (MICARDIS HCT) 80-25 MG tablet Take 1 tablet by mouth daily. 90 tablet 3  . Vitamin D, Ergocalciferol, (DRISDOL) 50000 UNITS CAPS capsule TAKE ONE CAPSULE BY MOUTH EVERY 30 DAYS 6 capsule 2   No current facility-administered medications on file prior to visit.   Review of Systems  Constitutional: Negative for unusual diaphoresis or night sweats HENT: Negative for ringing in ear or discharge Eyes: Negative for double vision or worsening visual  disturbance.  Respiratory: Negative for choking and stridor.   Gastrointestinal: Negative for vomiting or other signifcant bowel change Genitourinary: Negative for hematuria or change in urine volume.  Musculoskeletal: Negative for other MSK pain or swelling Skin: Negative for color change and worsening wound.  Neurological: Negative for tremors and numbness other than noted  Psychiatric/Behavioral: Negative for decreased concentration or agitation other than above       Objective:   Physical Exam BP 160/92 mmHg  Pulse 92  Temp(Src) 98.2 F (36.8 C) (Oral)  Resp 18  Wt 166 lb (75.297 kg)  SpO2 98% VS noted, mild ill Constitutional: Pt appears in no significant distress HENT: Head: NCAT.  Right Ear: External ear normal.  Left Ear: External ear normal.  Left tm's with severe erythema, right tm mild erythema at best,.  Max sinus areas non tender.  Pharynx with mod erythema, no exudate, but with bilat submandib LA tender noted Eyes: . Pupils are equal, round, and reactive to light. Conjunctivae and EOM are normal Neck: Normal range of motion. Neck supple.  Cardiovascular: Normal rate and regular rhythm.   Pulmonary/Chest: Effort normal and breath sounds without rales or wheezing.  Abd:  Soft, NT, ND, + BS Neurological: Pt is alert. Not confused , motor 5/5 intact, FTN intact, gait intact Right lateral hip area mild tender, no swelling Skin: Skin is warm. No rash, no LE edema Psychiatric: Pt behavior is normal. No agitation.     Assessment & Plan:

## 2015-08-30 NOTE — Progress Notes (Signed)
Pre visit review using our clinic review tool, if applicable. No additional management support is needed unless otherwise documented below in the visit note. 

## 2015-09-15 ENCOUNTER — Encounter: Payer: Self-pay | Admitting: Internal Medicine

## 2015-09-15 ENCOUNTER — Ambulatory Visit (INDEPENDENT_AMBULATORY_CARE_PROVIDER_SITE_OTHER): Admitting: Internal Medicine

## 2015-09-15 VITALS — BP 130/86 | HR 88 | Wt 167.0 lb

## 2015-09-15 DIAGNOSIS — M5010 Cervical disc disorder with radiculopathy, unspecified cervical region: Secondary | ICD-10-CM

## 2015-09-15 DIAGNOSIS — E559 Vitamin D deficiency, unspecified: Secondary | ICD-10-CM

## 2015-09-15 DIAGNOSIS — M25512 Pain in left shoulder: Secondary | ICD-10-CM | POA: Diagnosis not present

## 2015-09-15 DIAGNOSIS — M25511 Pain in right shoulder: Secondary | ICD-10-CM | POA: Diagnosis not present

## 2015-09-15 MED ORDER — MORPHINE SULFATE ER BEADS 60 MG PO CP24: 60.0000 mg | ORAL_CAPSULE | Freq: Every day | ORAL | Status: DC

## 2015-09-15 MED ORDER — OXYCODONE HCL 15 MG PO TABS: 15.0000 mg | ORAL_TABLET | Freq: Three times a day (TID) | ORAL | Status: DC | PRN

## 2015-09-15 NOTE — Progress Notes (Signed)
Pre visit review using our clinic review tool, if applicable. No additional management support is needed unless otherwise documented below in the visit note. 

## 2015-09-15 NOTE — Assessment & Plan Note (Signed)
On Vit D 

## 2015-09-15 NOTE — Assessment & Plan Note (Signed)
Insurance denied ketoprofen cream - hurts more. Oral Ketoprofen made her stomach upset. Pt has to take Oxy tid

## 2015-09-15 NOTE — Assessment & Plan Note (Signed)
Chronic R>L  On Avinza/OXY  Potential benefits of a long term opioids use as well as potential risks (i.e. addiction risk, apnea etc) and complications (i.e. Somnolence, constipation and others) were explained to the patient and were aknowledged.

## 2015-09-15 NOTE — Progress Notes (Signed)
Subjective:  Patient ID: Karina Newman, female    DOB: [birth year]  Age: 60 y.o. MRN: [id number]  CC: No chief complaint on file.   HPI Karina Newman presents for neck pain, shoulder pain. Insurance denied ketoprofen cream - hurts more. Oral Ketoprofen made her stomach upset. Pt has to take Oxy tid.  Outpatient Prescriptions Prior to Visit  Medication Sig Dispense Refill  . amLODipine (NORVASC) 10 MG tablet Take 1 tablet (10 mg total) by mouth daily. 90 tablet 3  . furosemide (LASIX) 20 MG tablet Take 1-2 tablets (20-40 mg total) by mouth daily as needed for fluid or edema. 60 tablet 5  . ketoprofen (ORUDIS) 50 MG capsule Take 1 capsule (50 mg total) by mouth every 8 (eight) hours as needed for moderate pain. Take pc 90 capsule 2  . Ketoprofen 10 % CREA APPLY 1-3G DAILY THREE TIMES DAILY. 120 g 3  . loratadine (CLARITIN) 10 MG tablet Take 1 tablet (10 mg total) by mouth daily. 100 tablet 3  . omeprazole (PRILOSEC OTC) 20 MG tablet Take 1 tablet (20 mg total) by mouth daily. 30 tablet 0  . ondansetron (ZOFRAN) 4 MG tablet Take 1 tablet (4 mg total) by mouth every 8 (eight) hours as needed for nausea or vomiting. 20 tablet 0  . pantoprazole (PROTONIX) 20 MG tablet Take 1 tablet (20 mg total) by mouth daily. 30 tablet 11  . potassium chloride (KLOR-CON 10) 10 MEQ tablet Take 1 tablet (10 mEq total) by mouth 2 (two) times daily. 60 tablet 1  . pseudoephedrine (SUDAFED) 120 MG 12 hr tablet Take 1 tablet (120 mg total) by mouth 2 (two) times daily as needed for congestion. 30 tablet 1  . SUMAtriptan (IMITREX) 100 MG tablet Take 1 tablet (100 mg total) by mouth every 2 (two) hours as needed for migraine or headache. May repeat in 2 hours if headache persists or recurs. 10 tablet 2  . telmisartan-hydrochlorothiazide (MICARDIS HCT) 80-25 MG tablet Take 1 tablet by mouth daily. 90 tablet 3  . Vitamin D, Ergocalciferol, (DRISDOL) 50000 UNITS CAPS capsule TAKE ONE CAPSULE BY MOUTH EVERY 30 DAYS 6  capsule 2  . morphine (AVINZA) 60 MG 24 hr capsule Take 1 capsule (60 mg total) by mouth daily. Please fill on or after 08/11/15 30 capsule 0  . oxyCODONE (ROXICODONE) 15 MG immediate release tablet Take 1 tablet (15 mg total) by mouth 2 (two) times daily as needed. 60 tablet 0  . levofloxacin (LEVAQUIN) 500 MG tablet Take 1 tablet (500 mg total) by mouth daily. (Patient not taking: Reported on 09/15/2015) 10 tablet 0   No facility-administered medications prior to visit.    ROS Review of Systems  Constitutional: Negative for chills, activity change, appetite change, fatigue and unexpected weight change.  HENT: Negative for congestion, mouth sores and sinus pressure.   Eyes: Negative for visual disturbance.  Respiratory: Negative for cough and chest tightness.   Gastrointestinal: Negative for nausea and abdominal pain.  Genitourinary: Negative for frequency, difficulty urinating and vaginal pain.  Musculoskeletal: Positive for arthralgias and neck pain. Negative for back pain and gait problem.  Skin: Negative for pallor and rash.  Neurological: Negative for dizziness, tremors, weakness, numbness and headaches.  Psychiatric/Behavioral: Negative for suicidal ideas, confusion and sleep disturbance.    Objective:  BP 130/86 mmHg  Pulse 88  Wt 167 lb (75.751 kg)  SpO2 97%  BP Readings from Last 3 Encounters:  09/15/15 130/86  08/30/15 160/92  07/31/15 120/80  Wt Readings from Last 3 Encounters:  09/15/15 167 lb (75.751 kg)  08/30/15 166 lb (75.297 kg)  07/31/15 167 lb (75.751 kg)    Physical Exam  Constitutional: She appears well-developed. No distress.  HENT:  Head: Normocephalic.  Right Ear: External ear normal.  Left Ear: External ear normal.  Nose: Nose normal.  Mouth/Throat: Oropharynx is clear and moist.  Eyes: Conjunctivae are normal. Pupils are equal, round, and reactive to light. Right eye exhibits no discharge. Left eye exhibits no discharge.  Neck: Normal range  of motion. Neck supple. No JVD present. No tracheal deviation present. No thyromegaly present.  Cardiovascular: Normal rate, regular rhythm and normal heart sounds.   Pulmonary/Chest: No stridor. No respiratory distress. She has no wheezes.  Abdominal: Soft. Bowel sounds are normal. She exhibits no distension and no mass. There is no tenderness. There is no rebound and no guarding.  Musculoskeletal: She exhibits no edema or tenderness.  Lymphadenopathy:    She has no cervical adenopathy.  Neurological: She displays normal reflexes. No cranial nerve deficit. She exhibits normal muscle tone. Coordination normal.  Skin: No rash noted. No erythema.  Psychiatric: She has a normal mood and affect. Her behavior is normal. Judgment and thought content normal.    Lab Results  Component Value Date   WBC 5.7 03/31/2015   HGB 13.7 03/31/2015   HCT 40.2 03/31/2015   PLT 385 03/31/2015   GLUCOSE 88 03/31/2015   CHOL 152 07/03/2013   TRIG 121.0 07/03/2013   HDL 39.60 07/03/2013   LDLCALC 88 07/03/2013   ALT 27 07/03/2013   AST 23 07/03/2013   NA 138 03/31/2015   K 3.2* 03/31/2015   CL 100* 03/31/2015   CREATININE 0.88 03/31/2015   BUN 10 03/31/2015   CO2 29 03/31/2015   TSH 0.92 07/31/2014    Dg Chest 2 View  03/31/2015  CLINICAL DATA:  Chest pain 1 day. EXAM: CHEST  2 VIEW COMPARISON:  08/14/2013 FINDINGS: Lungs are adequately inflated without consolidation or effusion. Stable borderline cardiomegaly. Minimal degenerative change of the spine. IMPRESSION: No active cardiopulmonary disease. Electronically Signed   By: Marin Olp M.D.   On: 03/31/2015 16:50    Assessment & Plan:   Diagnoses and all orders for this visit:  Cervical disc disorder with radiculopathy of cervical region  Other orders -     Discontinue: morphine (AVINZA) 60 MG 24 hr capsule; Take 1 capsule (60 mg total) by mouth daily. Please fill on or after 09/23/15 -     Discontinue: oxyCODONE (ROXICODONE) 15 MG immediate  release tablet; Take 1 tablet (15 mg total) by mouth every 8 (eight) hours as needed for pain. -     Discontinue: oxyCODONE (ROXICODONE) 15 MG immediate release tablet; Take 1 tablet (15 mg total) by mouth every 8 (eight) hours as needed for pain. -     Discontinue: morphine (AVINZA) 60 MG 24 hr capsule; Take 1 capsule (60 mg total) by mouth daily. Please fill on or after 10/21/15 -     morphine (AVINZA) 60 MG 24 hr capsule; Take 1 capsule (60 mg total) by mouth daily. Please fill on or after 11/21/15 -     oxyCODONE (ROXICODONE) 15 MG immediate release tablet; Take 1 tablet (15 mg total) by mouth every 8 (eight) hours as needed for pain.   I have discontinued Ms. Signor's morphine, levofloxacin, and morphine. I have also changed her morphine. Additionally, I am having her maintain her pseudoephedrine, furosemide, loratadine, ondansetron, SUMAtriptan,  omeprazole, pantoprazole, potassium chloride, Vitamin D (Ergocalciferol), Ketoprofen, ketoprofen, telmisartan-hydrochlorothiazide, amLODipine, and oxyCODONE.  Meds ordered this encounter  Medications  . DISCONTD: morphine (AVINZA) 60 MG 24 hr capsule    Sig: Take 1 capsule (60 mg total) by mouth daily. Please fill on or after 09/23/15    Dispense:  30 capsule    Refill:  0  . DISCONTD: oxyCODONE (ROXICODONE) 15 MG immediate release tablet    Sig: Take 1 tablet (15 mg total) by mouth every 8 (eight) hours as needed for pain.    Dispense:  90 tablet    Refill:  0    Please fill on or after 09/23/15  . DISCONTD: oxyCODONE (ROXICODONE) 15 MG immediate release tablet    Sig: Take 1 tablet (15 mg total) by mouth every 8 (eight) hours as needed for pain.    Dispense:  90 tablet    Refill:  0    Please fill on or after 10/21/15  . DISCONTD: morphine (AVINZA) 60 MG 24 hr capsule    Sig: Take 1 capsule (60 mg total) by mouth daily. Please fill on or after 10/21/15    Dispense:  30 capsule    Refill:  0  . morphine (AVINZA) 60 MG 24 hr capsule    Sig: Take 1  capsule (60 mg total) by mouth daily. Please fill on or after 11/21/15    Dispense:  30 capsule    Refill:  0  . oxyCODONE (ROXICODONE) 15 MG immediate release tablet    Sig: Take 1 tablet (15 mg total) by mouth every 8 (eight) hours as needed for pain.    Dispense:  90 tablet    Refill:  0    Please fill on or after 11/21/15     Follow-up: Return in about 3 months (around 12/14/2015) for a follow-up visit.  Walker Kehr, MD

## 2015-09-17 ENCOUNTER — Ambulatory Visit: Payer: Federal, State, Local not specified - PPO | Admitting: Internal Medicine

## 2015-09-19 ENCOUNTER — Ambulatory Visit (INDEPENDENT_AMBULATORY_CARE_PROVIDER_SITE_OTHER): Payer: Federal, State, Local not specified - PPO | Admitting: Internal Medicine

## 2015-09-19 ENCOUNTER — Encounter: Payer: Self-pay | Admitting: Internal Medicine

## 2015-09-19 VITALS — BP 130/80 | HR 99 | Wt 167.0 lb

## 2015-09-19 DIAGNOSIS — M545 Low back pain, unspecified: Secondary | ICD-10-CM

## 2015-09-19 DIAGNOSIS — M79604 Pain in right leg: Secondary | ICD-10-CM | POA: Insufficient documentation

## 2015-09-19 DIAGNOSIS — J3089 Other allergic rhinitis: Secondary | ICD-10-CM | POA: Diagnosis not present

## 2015-09-19 DIAGNOSIS — H65112 Acute and subacute allergic otitis media (mucoid) (sanguinous) (serous), left ear: Secondary | ICD-10-CM | POA: Diagnosis not present

## 2015-09-19 DIAGNOSIS — M5010 Cervical disc disorder with radiculopathy, unspecified cervical region: Secondary | ICD-10-CM | POA: Diagnosis not present

## 2015-09-19 DIAGNOSIS — J309 Allergic rhinitis, unspecified: Secondary | ICD-10-CM | POA: Insufficient documentation

## 2015-09-19 MED ORDER — LEVOFLOXACIN 500 MG PO TABS: 500.0000 mg | ORAL_TABLET | Freq: Every day | ORAL | Status: DC

## 2015-09-19 MED ORDER — METHYLPREDNISOLONE 4 MG PO TBPK: ORAL_TABLET | ORAL | Status: DC

## 2015-09-19 NOTE — Assessment & Plan Note (Signed)
Contusion R buttock 08/30/15 - s/p fall Medrol dose pac X ray if not better

## 2015-09-19 NOTE — Assessment & Plan Note (Signed)
Medrol dose pac

## 2015-09-19 NOTE — Progress Notes (Signed)
Subjective:  Patient ID: Elira Labor, female    DOB: Nov 29, 1955  Age: 60 y.o. MRN: [id number]  CC: No chief complaint on file.   HPI Kijah Pompilio presents for passing out in the shower on on Aug 30 2015 after blowing her nose and not feeling well. C/o R earache - worse again  Outpatient Prescriptions Prior to Visit  Medication Sig Dispense Refill  . amLODipine (NORVASC) 10 MG tablet Take 1 tablet (10 mg total) by mouth daily. 90 tablet 3  . furosemide (LASIX) 20 MG tablet Take 1-2 tablets (20-40 mg total) by mouth daily as needed for fluid or edema. 60 tablet 5  . ketoprofen (ORUDIS) 50 MG capsule Take 1 capsule (50 mg total) by mouth every 8 (eight) hours as needed for moderate pain. Take pc 90 capsule 2  . Ketoprofen 10 % CREA APPLY 1-3G DAILY THREE TIMES DAILY. 120 g 3  . loratadine (CLARITIN) 10 MG tablet Take 1 tablet (10 mg total) by mouth daily. 100 tablet 3  . morphine (AVINZA) 60 MG 24 hr capsule Take 1 capsule (60 mg total) by mouth daily. Please fill on or after 11/21/15 30 capsule 0  . omeprazole (PRILOSEC OTC) 20 MG tablet Take 1 tablet (20 mg total) by mouth daily. 30 tablet 0  . ondansetron (ZOFRAN) 4 MG tablet Take 1 tablet (4 mg total) by mouth every 8 (eight) hours as needed for nausea or vomiting. 20 tablet 0  . oxyCODONE (ROXICODONE) 15 MG immediate release tablet Take 1 tablet (15 mg total) by mouth every 8 (eight) hours as needed for pain. 90 tablet 0  . pantoprazole (PROTONIX) 20 MG tablet Take 1 tablet (20 mg total) by mouth daily. 30 tablet 11  . potassium chloride (KLOR-CON 10) 10 MEQ tablet Take 1 tablet (10 mEq total) by mouth 2 (two) times daily. 60 tablet 1  . pseudoephedrine (SUDAFED) 120 MG 12 hr tablet Take 1 tablet (120 mg total) by mouth 2 (two) times daily as needed for congestion. 30 tablet 1  . SUMAtriptan (IMITREX) 100 MG tablet Take 1 tablet (100 mg total) by mouth every 2 (two) hours as needed for migraine or headache. May repeat in 2 hours if  headache persists or recurs. 10 tablet 2  . telmisartan-hydrochlorothiazide (MICARDIS HCT) 80-25 MG tablet Take 1 tablet by mouth daily. 90 tablet 3  . Vitamin D, Ergocalciferol, (DRISDOL) 50000 UNITS CAPS capsule TAKE ONE CAPSULE BY MOUTH EVERY 30 DAYS 6 capsule 2   No facility-administered medications prior to visit.    ROS Review of Systems  Objective:  BP 130/80 mmHg  Pulse 99  Wt 167 lb (75.751 kg)  SpO2 95%  BP Readings from Last 3 Encounters:  09/19/15 130/80  09/15/15 130/86  08/30/15 160/92    Wt Readings from Last 3 Encounters:  09/19/15 167 lb (75.751 kg)  09/15/15 167 lb (75.751 kg)  08/30/15 166 lb (75.297 kg)    Physical Exam  Lab Results  Component Value Date   WBC 5.7 03/31/2015   HGB 13.7 03/31/2015   HCT 40.2 03/31/2015   PLT 385 03/31/2015   GLUCOSE 88 03/31/2015   CHOL 152 07/03/2013   TRIG 121.0 07/03/2013   HDL 39.60 07/03/2013   LDLCALC 88 07/03/2013   ALT 27 07/03/2013   AST 23 07/03/2013   NA 138 03/31/2015   K 3.2* 03/31/2015   CL 100* 03/31/2015   CREATININE 0.88 03/31/2015   BUN 10 03/31/2015   CO2 29 03/31/2015  TSH 0.92 07/31/2014    Dg Chest 2 View  03/31/2015  CLINICAL DATA:  Chest pain 1 day. EXAM: CHEST  2 VIEW COMPARISON:  08/14/2013 FINDINGS: Lungs are adequately inflated without consolidation or effusion. Stable borderline cardiomegaly. Minimal degenerative change of the spine. IMPRESSION: No active cardiopulmonary disease. Electronically Signed   By: Marin Olp M.D.   On: 03/31/2015 16:50    Assessment & Plan:   There are no diagnoses linked to this encounter. I am having Ms. Clinger maintain her pseudoephedrine, furosemide, loratadine, ondansetron, SUMAtriptan, omeprazole, pantoprazole, potassium chloride, Vitamin D (Ergocalciferol), Ketoprofen, ketoprofen, telmisartan-hydrochlorothiazide, amLODipine, morphine, and oxyCODONE.  No orders of the defined types were placed in this encounter.     Follow-up: No  Follow-up on file.  Walker Kehr, MD

## 2015-09-19 NOTE — Assessment & Plan Note (Signed)
Claritin prn 

## 2015-09-19 NOTE — Assessment & Plan Note (Addendum)
Repeat levaquin Rx Steroid pac ENT ref

## 2015-09-19 NOTE — Progress Notes (Signed)
Pre visit review using our clinic review tool, if applicable. No additional management support is needed unless otherwise documented below in the visit note. 

## 2015-10-08 DIAGNOSIS — H6502 Acute serous otitis media, left ear: Secondary | ICD-10-CM | POA: Insufficient documentation

## 2015-10-10 ENCOUNTER — Telehealth: Payer: Self-pay | Admitting: Internal Medicine

## 2015-10-10 DIAGNOSIS — E0590 Thyrotoxicosis, unspecified without thyrotoxic crisis or storm: Secondary | ICD-10-CM

## 2015-10-10 DIAGNOSIS — I10 Essential (primary) hypertension: Secondary | ICD-10-CM

## 2015-10-10 DIAGNOSIS — E559 Vitamin D deficiency, unspecified: Secondary | ICD-10-CM

## 2015-10-10 NOTE — Telephone Encounter (Signed)
Pt wondering if Dr. Alain Marion has had a chance to register online for workers comp for the necessity form for the ketoprofen cream.  She can be reached at [phone]

## 2015-10-13 NOTE — Telephone Encounter (Signed)
I tried. I do not have ID #'s that they require. Thx

## 2015-10-14 NOTE — Telephone Encounter (Signed)
CBC, TSH, Lipids, CMET, UA, Vit D, Vit B12 Thx

## 2015-10-14 NOTE — Telephone Encounter (Signed)
Pt informed of below.   She wants labs checked, specifically a CBC and her thyroid and anything else you want tested.  Please advise

## 2015-10-15 NOTE — Telephone Encounter (Signed)
Labs ordered. Left detailed mess informing pt.  

## 2015-10-16 ENCOUNTER — Other Ambulatory Visit (INDEPENDENT_AMBULATORY_CARE_PROVIDER_SITE_OTHER): Payer: Federal, State, Local not specified - PPO

## 2015-10-16 DIAGNOSIS — E559 Vitamin D deficiency, unspecified: Secondary | ICD-10-CM

## 2015-10-16 DIAGNOSIS — E0590 Thyrotoxicosis, unspecified without thyrotoxic crisis or storm: Secondary | ICD-10-CM

## 2015-10-16 DIAGNOSIS — I10 Essential (primary) hypertension: Secondary | ICD-10-CM

## 2015-10-16 LAB — LIPID PANEL
Cholesterol: 171 mg/dL (ref 0–200)
HDL: 42.2 mg/dL (ref >39.00)
LDL CALC: 105 mg/dL — AB (ref 0–99)
NONHDL: 128.38
Total CHOL/HDL Ratio: 4
Triglycerides: 119 mg/dL (ref 0.0–149.0)
VLDL: 23.8 mg/dL (ref 0.0–40.0)

## 2015-10-16 LAB — CBC WITH DIFFERENTIAL/PLATELET
BASOS PCT: 0.3 % (ref 0.0–3.0)
Basophils Absolute: 0 10*3/uL (ref 0.0–0.1)
EOS PCT: 1.4 % (ref 0.0–5.0)
Eosinophils Absolute: 0.1 10*3/uL (ref 0.0–0.7)
HCT: 39.5 % (ref 36.0–46.0)
HEMOGLOBIN: 13 g/dL (ref 12.0–15.0)
LYMPHS PCT: 43.2 % (ref 12.0–46.0)
Lymphs Abs: 2.3 10*3/uL (ref 0.7–4.0)
MCHC: 32.8 g/dL (ref 30.0–36.0)
MCV: 93.5 fl (ref 78.0–100.0)
Monocytes Absolute: 0.4 10*3/uL (ref 0.1–1.0)
Monocytes Relative: 6.9 % (ref 3.0–12.0)
Neutro Abs: 2.6 10*3/uL (ref 1.4–7.7)
Neutrophils Relative %: 48.2 % (ref 43.0–77.0)
Platelets: 338 10*3/uL (ref 150.0–400.0)
RBC: 4.23 Mil/uL (ref 3.87–5.11)
RDW: 12.1 % (ref 11.5–15.5)
WBC: 5.3 10*3/uL (ref 4.0–10.5)

## 2015-10-16 LAB — COMPREHENSIVE METABOLIC PANEL
ALT: 17 U/L (ref 0–35)
AST: 15 U/L (ref 0–37)
Albumin: 4.1 g/dL (ref 3.5–5.2)
Alkaline Phosphatase: 75 U/L (ref 39–117)
BUN: 12 mg/dL (ref 6–23)
CHLORIDE: 104 meq/L (ref 96–112)
CO2: 30 meq/L (ref 19–32)
Calcium: 9.3 mg/dL (ref 8.4–10.5)
Creatinine, Ser: 0.79 mg/dL (ref 0.40–1.20)
GFR: 95.91 mL/min (ref >60.00)
GLUCOSE: 101 mg/dL — AB (ref 70–99)
POTASSIUM: 3.8 meq/L (ref 3.5–5.1)
SODIUM: 141 meq/L (ref 135–145)
Total Bilirubin: 0.3 mg/dL (ref 0.2–1.2)
Total Protein: 7.3 g/dL (ref 6.0–8.3)

## 2015-10-16 LAB — TSH: TSH: 0.82 u[IU]/mL (ref 0.35–4.50)

## 2015-10-16 LAB — VITAMIN B12: Vitamin B-12: 292 pg/mL (ref 211–911)

## 2015-10-16 LAB — VITAMIN D 25 HYDROXY (VIT D DEFICIENCY, FRACTURES): VITD: 21.13 ng/mL — ABNORMAL LOW (ref 30.00–100.00)

## 2015-10-17 ENCOUNTER — Telehealth: Payer: Self-pay | Admitting: Internal Medicine

## 2015-10-17 LAB — URINALYSIS, ROUTINE W REFLEX MICROSCOPIC
BILIRUBIN URINE: NEGATIVE
HGB URINE DIPSTICK: NEGATIVE
Ketones, ur: NEGATIVE
Leukocytes, UA: NEGATIVE
NITRITE: NEGATIVE
PH: 5.5 (ref 5.0–8.0)
Specific Gravity, Urine: 1.025 (ref 1.000–1.030)
TOTAL PROTEIN, URINE-UPE24: NEGATIVE
Urine Glucose: NEGATIVE
Urobilinogen, UA: 0.2 (ref 0.0–1.0)

## 2015-10-17 NOTE — Telephone Encounter (Signed)
FYI.  Patient wants cell phone to always be called first.

## 2015-10-17 NOTE — Telephone Encounter (Signed)
Mobile is currently listed as first to call.

## 2015-10-26 ENCOUNTER — Other Ambulatory Visit: Payer: Self-pay | Admitting: Internal Medicine

## 2015-10-26 MED ORDER — VITAMIN D3 50 MCG (2000 UT) PO CAPS: 2000.0000 [IU] | ORAL_CAPSULE | Freq: Every day | ORAL | Status: DC

## 2015-10-26 MED ORDER — VITAMIN D (ERGOCALCIFEROL) 1.25 MG (50000 UNIT) PO CAPS: 50000.0000 [IU] | ORAL_CAPSULE | ORAL | Status: DC

## 2015-10-26 MED ORDER — VITAMIN B-12 500 MCG SL SUBL: SUBLINGUAL_TABLET | SUBLINGUAL | Status: DC

## 2015-11-05 ENCOUNTER — Ambulatory Visit (INDEPENDENT_AMBULATORY_CARE_PROVIDER_SITE_OTHER): Payer: Federal, State, Local not specified - PPO | Admitting: Nurse Practitioner

## 2015-11-05 ENCOUNTER — Encounter: Payer: Self-pay | Admitting: Nurse Practitioner

## 2015-11-05 VITALS — BP 128/78 | HR 93 | Temp 98.2°F | Resp 18 | Ht 64.0 in | Wt 165.0 lb

## 2015-11-05 DIAGNOSIS — J069 Acute upper respiratory infection, unspecified: Secondary | ICD-10-CM

## 2015-11-05 MED ORDER — CEPHALEXIN 500 MG PO CAPS: 500.0000 mg | ORAL_CAPSULE | Freq: Two times a day (BID) | ORAL | Status: DC

## 2015-11-05 MED ORDER — BENZONATATE 100 MG PO CAPS: 100.0000 mg | ORAL_CAPSULE | Freq: Two times a day (BID) | ORAL | Status: DC | PRN

## 2015-11-05 MED ORDER — METHYLPREDNISOLONE 4 MG PO TBPK: ORAL_TABLET | ORAL | Status: DC

## 2015-11-05 NOTE — Progress Notes (Signed)
Patient ID: Karina Newman, female    DOB: Sep 15, 1955  Age: 60 y.o. MRN: [id number]  CC: Acute Visit   HPI Karina Newman presents for CC of cough x 2 weeks.   1) Pt was seen in Feb for OME left ear Levaquin, steroid pack and ENT Was referred to Dr. Wilburn Newman  Rhinorrhea  Left ear pain with blowing nose  Watery eyes   Laryngitis started on the 12th and has gradually improved   Tmax- 101 Sunday 19th and lasted a day or two   Treatment to date: Stopped Claritin 1 month ago  Mucinex- not helpful  Nyquil and Dayquil    History Karina Newman has a past medical history of Thyroid disease; Right shoulder pain (2001); Hypertension; and Abnormal cervical Pap smear with positive HPV DNA test (01/2014).   She has past surgical history that includes Cesarean section; Shoulder surgery (Right, 2002); Shoulder surgery (Left, 2014); Hand surgery; and Tubal ligation.   Her family history includes Cancer in her father; Diabetes in her brother; Hypertension in her mother and sister; Kidney disease in her mother.She reports that she has never smoked. She does not have any smokeless tobacco history on file. She reports that she drinks alcohol. She reports that she does not use illicit drugs.  Outpatient Prescriptions Prior to Visit  Medication Sig Dispense Refill  . amLODipine (NORVASC) 10 MG tablet Take 1 tablet (10 mg total) by mouth daily. 90 tablet 3  . Cholecalciferol (VITAMIN D3) 2000 units capsule Take 1 capsule (2,000 Units total) by mouth daily. 100 capsule 3  . Cyanocobalamin (VITAMIN B-12) 500 MCG SUBL 1 sl qd 100 tablet 5  . Ketoprofen 10 % CREA APPLY 1-3G DAILY THREE TIMES DAILY. 120 g 3  . morphine (AVINZA) 60 MG 24 hr capsule Take 1 capsule (60 mg total) by mouth daily. Please fill on or after 11/21/15 30 capsule 0  . omeprazole (PRILOSEC OTC) 20 MG tablet Take 1 tablet (20 mg total) by mouth daily. 30 tablet 0  . oxyCODONE (ROXICODONE) 15 MG immediate release tablet Take 1 tablet (15 mg total)  by mouth every 8 (eight) hours as needed for pain. 90 tablet 0  . potassium chloride (KLOR-CON 10) 10 MEQ tablet Take 1 tablet (10 mEq total) by mouth 2 (two) times daily. 60 tablet 1  . pseudoephedrine (SUDAFED) 120 MG 12 hr tablet Take 1 tablet (120 mg total) by mouth 2 (two) times daily as needed for congestion. 30 tablet 1  . telmisartan-hydrochlorothiazide (MICARDIS HCT) 80-25 MG tablet Take 1 tablet by mouth daily. 90 tablet 3  . Vitamin D, Ergocalciferol, (DRISDOL) 50000 units CAPS capsule Take 1 capsule (50,000 Units total) by mouth every 7 (seven) days. 8 capsule 0  . furosemide (LASIX) 20 MG tablet Take 1-2 tablets (20-40 mg total) by mouth daily as needed for fluid or edema. (Patient not taking: Reported on 11/05/2015) 60 tablet 5  . ketoprofen (ORUDIS) 50 MG capsule Take 1 capsule (50 mg total) by mouth every 8 (eight) hours as needed for moderate pain. Take pc (Patient not taking: Reported on 11/05/2015) 90 capsule 2  . levofloxacin (LEVAQUIN) 500 MG tablet Take 1 tablet (500 mg total) by mouth daily. (Patient not taking: Reported on 11/05/2015) 10 tablet 0  . loratadine (CLARITIN) 10 MG tablet Take 1 tablet (10 mg total) by mouth daily. (Patient not taking: Reported on 11/05/2015) 100 tablet 3  . methylPREDNISolone (MEDROL DOSEPAK) 4 MG TBPK tablet As directed (Patient not taking: Reported on 11/05/2015) 21  tablet 0  . ondansetron (ZOFRAN) 4 MG tablet Take 1 tablet (4 mg total) by mouth every 8 (eight) hours as needed for nausea or vomiting. (Patient not taking: Reported on 11/05/2015) 20 tablet 0  . pantoprazole (PROTONIX) 20 MG tablet Take 1 tablet (20 mg total) by mouth daily. (Patient not taking: Reported on 11/05/2015) 30 tablet 11  . SUMAtriptan (IMITREX) 100 MG tablet Take 1 tablet (100 mg total) by mouth every 2 (two) hours as needed for migraine or headache. May repeat in 2 hours if headache persists or recurs. (Patient not taking: Reported on 11/05/2015) 10 tablet 2   No  facility-administered medications prior to visit.    ROS Review of Systems  Constitutional: Positive for fatigue. Negative for fever, chills and diaphoresis.  HENT: Positive for congestion, ear pain, postnasal drip, rhinorrhea, sore throat and voice change.        Laryngitis  Respiratory: Positive for cough. Negative for chest tightness, shortness of breath and wheezing.   Cardiovascular: Negative for chest pain, palpitations and leg swelling.  Gastrointestinal: Negative for nausea, vomiting and diarrhea.  Skin: Negative for rash.  Neurological: Negative for dizziness and headaches.    Objective:  BP 128/78 mmHg  Pulse 93  Temp(Src) 98.2 F (36.8 C) (Oral)  Resp 18  Ht 5\' 4"  (1.626 m)  Wt 165 lb (74.844 kg)  BMI 28.31 kg/m2  SpO2 98%  Physical Exam  Constitutional: She is oriented to person, place, and time. She appears well-developed and well-nourished. No distress.  HENT:  Head: Normocephalic and atraumatic.  Right Ear: External ear normal.  Left Ear: External ear normal.  Mouth/Throat: Oropharynx is clear and moist. No oropharyngeal exudate.  TMs clear bilaterally  Eyes: EOM are normal. Pupils are equal, round, and reactive to light. Right eye exhibits no discharge. Left eye exhibits no discharge. No scleral icterus.  Neck: Normal range of motion. Neck supple.  Cardiovascular: Normal rate and regular rhythm.   Pulmonary/Chest: Effort normal and breath sounds normal. No respiratory distress. She has no wheezes. She has no rales. She exhibits no tenderness.  Lymphadenopathy:    She has no cervical adenopathy.  Neurological: She is alert and oriented to person, place, and time. No cranial nerve deficit. She exhibits normal muscle tone. Coordination normal.  Skin: Skin is warm and dry. No rash noted. She is not diaphoretic.  Psychiatric: She has a normal mood and affect. Her behavior is normal. Judgment and thought content normal.   Assessment & Plan:   There are no  diagnoses linked to this encounter. I have discontinued Karina Newman's furosemide, loratadine, ondansetron, SUMAtriptan, pantoprazole, and levofloxacin. I am also having her start on cephALEXin and benzonatate. Additionally, I am having her maintain her pseudoephedrine, omeprazole, potassium chloride, Ketoprofen, telmisartan-hydrochlorothiazide, amLODipine, morphine, oxyCODONE, Vitamin D (Ergocalciferol), Vitamin D3, Vitamin B-12, morphine, and methylPREDNISolone.  Meds ordered this encounter  Medications  . morphine (KADIAN) 60 MG 24 hr capsule    Sig: Take 60 mg by mouth daily. Reported on 11/05/2015    Refill:  0  . cephALEXin (KEFLEX) 500 MG capsule    Sig: Take 1 capsule (500 mg total) by mouth 2 (two) times daily.    Dispense:  14 capsule    Refill:  0    Order Specific Question:  Supervising Provider    Answer:  Deborra Medina L [[id number]]  . benzonatate (TESSALON) 100 MG capsule    Sig: Take 1 capsule (100 mg total) by mouth 2 (two) times daily as  needed for cough.    Dispense:  20 capsule    Refill:  0    Order Specific Question:  Supervising Provider    Answer:  Deborra Medina L [[id number]]  . methylPREDNISolone (MEDROL DOSEPAK) 4 MG TBPK tablet    Sig: As directed    Dispense:  21 tablet    Refill:  0    Order Specific Question:  Supervising Provider    Answer:  Crecencio Mc [[id number]]     Follow-up: Return if symptoms worsen or fail to improve.

## 2015-11-05 NOTE — Patient Instructions (Signed)
Tessalon perles - for cough Keflex- antibiotic  Prednisone with breakfast or lunch at the latest.  6 tablets on day 1, 5 tablets on day 2, 4 tablets on day 3, 3 tablets on day 4, 2 tablets day 5, 1 tablet on day 6...done! Take tablets all together not spaced out Don't take with NSAIDs (Ibuprofen, Aleve, Naproxen, Meloxicam ect...)

## 2015-11-05 NOTE — Progress Notes (Signed)
Pre visit review using our clinic review tool, if applicable. No additional management support is needed unless otherwise documented below in the visit note. 

## 2015-11-09 NOTE — Assessment & Plan Note (Signed)
New onset Will treat conservatively due to probable viral nature Prednisone taper to decrease inflammation given  Instructions for taking done verbally and on AVS Tessalon perles sent to pharmacy for cough Mucinex plain and benadryl at night encouraged  Antibiotic prescription printed/sent to pharmacy in case of no improvement or fever within 48 hours. Pt verbalized understanding of need to hold.  FU prn worsening/failure to improve.

## 2015-12-08 ENCOUNTER — Telehealth: Payer: Self-pay

## 2015-12-11 DIAGNOSIS — H6993 Unspecified Eustachian tube disorder, bilateral: Secondary | ICD-10-CM | POA: Insufficient documentation

## 2015-12-11 DIAGNOSIS — R490 Dysphonia: Secondary | ICD-10-CM | POA: Insufficient documentation

## 2015-12-12 NOTE — Telephone Encounter (Signed)
error 

## 2015-12-15 ENCOUNTER — Encounter: Payer: Self-pay | Admitting: Internal Medicine

## 2015-12-15 ENCOUNTER — Ambulatory Visit (INDEPENDENT_AMBULATORY_CARE_PROVIDER_SITE_OTHER): Admitting: Internal Medicine

## 2015-12-15 VITALS — BP 140/80 | HR 78 | Wt 165.0 lb

## 2015-12-15 DIAGNOSIS — M5010 Cervical disc disorder with radiculopathy, unspecified cervical region: Secondary | ICD-10-CM | POA: Diagnosis not present

## 2015-12-15 DIAGNOSIS — I10 Essential (primary) hypertension: Secondary | ICD-10-CM | POA: Diagnosis not present

## 2015-12-15 DIAGNOSIS — S43431S Superior glenoid labrum lesion of right shoulder, sequela: Secondary | ICD-10-CM

## 2015-12-15 MED ORDER — MORPHINE SULFATE ER BEADS 60 MG PO CP24: 60.0000 mg | ORAL_CAPSULE | Freq: Every day | ORAL | Status: DC

## 2015-12-15 MED ORDER — DICLOFENAC SODIUM 1.5 % TD SOLN: 5.0000 [drp] | Freq: Four times a day (QID) | TRANSDERMAL | Status: DC | PRN

## 2015-12-15 MED ORDER — OXYCODONE HCL 15 MG PO TABS: 15.0000 mg | ORAL_TABLET | Freq: Two times a day (BID) | ORAL | Status: DC | PRN

## 2015-12-15 MED ORDER — OXYCODONE HCL 15 MG PO TABS: 15.0000 mg | ORAL_TABLET | Freq: Three times a day (TID) | ORAL | Status: DC | PRN

## 2015-12-15 NOTE — Assessment & Plan Note (Signed)
5/17 - will try to reduce Oxycodone to bid prn starting 6/17

## 2015-12-15 NOTE — Progress Notes (Signed)
Subjective:  Patient ID: Karina Newman, female    DOB: 09-27-1955  Age: 60 y.o. MRN: [id number]  CC: No chief complaint on file.   HPI Karina Newman presents for chronic pain f/u  Outpatient Prescriptions Prior to Visit  Medication Sig Dispense Refill  . amLODipine (NORVASC) 10 MG tablet Take 1 tablet (10 mg total) by mouth daily. 90 tablet 3  . benzonatate (TESSALON) 100 MG capsule Take 1 capsule (100 mg total) by mouth 2 (two) times daily as needed for cough. 20 capsule 0  . Cholecalciferol (VITAMIN D3) 2000 units capsule Take 1 capsule (2,000 Units total) by mouth daily. 100 capsule 3  . Cyanocobalamin (VITAMIN B-12) 500 MCG SUBL 1 sl qd 100 tablet 5  . Ketoprofen 10 % CREA APPLY 1-3G DAILY THREE TIMES DAILY. 120 g 3  . methylPREDNISolone (MEDROL DOSEPAK) 4 MG TBPK tablet As directed 21 tablet 0  . omeprazole (PRILOSEC OTC) 20 MG tablet Take 1 tablet (20 mg total) by mouth daily. 30 tablet 0  . potassium chloride (KLOR-CON 10) 10 MEQ tablet Take 1 tablet (10 mEq total) by mouth 2 (two) times daily. 60 tablet 1  . pseudoephedrine (SUDAFED) 120 MG 12 hr tablet Take 1 tablet (120 mg total) by mouth 2 (two) times daily as needed for congestion. 30 tablet 1  . telmisartan-hydrochlorothiazide (MICARDIS HCT) 80-25 MG tablet Take 1 tablet by mouth daily. 90 tablet 3  . Vitamin D, Ergocalciferol, (DRISDOL) 50000 units CAPS capsule Take 1 capsule (50,000 Units total) by mouth every 7 (seven) days. 8 capsule 0  . morphine (AVINZA) 60 MG 24 hr capsule Take 1 capsule (60 mg total) by mouth daily. Please fill on or after 11/21/15 30 capsule 0  . morphine (KADIAN) 60 MG 24 hr capsule Take 60 mg by mouth daily. Reported on 11/05/2015  0  . oxyCODONE (ROXICODONE) 15 MG immediate release tablet Take 1 tablet (15 mg total) by mouth every 8 (eight) hours as needed for pain. 90 tablet 0  . cephALEXin (KEFLEX) 500 MG capsule Take 1 capsule (500 mg total) by mouth 2 (two) times daily. (Patient not taking:  Reported on 12/15/2015) 14 capsule 0   No facility-administered medications prior to visit.    ROS Review of Systems  Constitutional: Negative for chills, activity change, appetite change, fatigue and unexpected weight change.  HENT: Negative for congestion, mouth sores and sinus pressure.   Eyes: Negative for visual disturbance.  Respiratory: Negative for cough and chest tightness.   Gastrointestinal: Negative for nausea and abdominal pain.  Genitourinary: Negative for frequency, difficulty urinating and vaginal pain.  Musculoskeletal: Positive for arthralgias, neck pain and neck stiffness. Negative for back pain and gait problem.  Skin: Negative for pallor and rash.  Neurological: Negative for dizziness, tremors, weakness, numbness and headaches.  Psychiatric/Behavioral: Negative for confusion and sleep disturbance.    Objective:  BP 140/80 mmHg  Pulse 78  Wt 165 lb (74.844 kg)  SpO2 96%  BP Readings from Last 3 Encounters:  12/15/15 140/80  11/05/15 128/78  09/19/15 130/80    Wt Readings from Last 3 Encounters:  12/15/15 165 lb (74.844 kg)  11/05/15 165 lb (74.844 kg)  09/19/15 167 lb (75.751 kg)    Physical Exam  Constitutional: She appears well-developed. No distress.  HENT:  Head: Normocephalic.  Right Ear: External ear normal.  Left Ear: External ear normal.  Nose: Nose normal.  Mouth/Throat: Oropharynx is clear and moist.  Eyes: Conjunctivae are normal. Pupils are equal, round,  and reactive to light. Right eye exhibits no discharge. Left eye exhibits no discharge.  Neck: Normal range of motion. Neck supple. No JVD present. No tracheal deviation present. No thyromegaly present.  Cardiovascular: Normal rate, regular rhythm and normal heart sounds.   Pulmonary/Chest: No stridor. No respiratory distress. She has no wheezes.  Abdominal: Soft. Bowel sounds are normal. She exhibits no distension and no mass. There is no tenderness. There is no rebound and no guarding.    Musculoskeletal: She exhibits tenderness. She exhibits no edema.  Lymphadenopathy:    She has no cervical adenopathy.  Neurological: She displays normal reflexes. No cranial nerve deficit. She exhibits normal muscle tone. Coordination normal.  Skin: No rash noted. No erythema.  Psychiatric: She has a normal mood and affect. Her behavior is normal. Judgment and thought content normal.  R shoulder hurts w/ROM  Lab Results  Component Value Date   WBC 5.3 10/16/2015   HGB 13.0 10/16/2015   HCT 39.5 10/16/2015   PLT 338.0 10/16/2015   GLUCOSE 101* 10/16/2015   CHOL 171 10/16/2015   TRIG 119.0 10/16/2015   HDL 42.20 10/16/2015   LDLCALC 105* 10/16/2015   ALT 17 10/16/2015   AST 15 10/16/2015   NA 141 10/16/2015   K 3.8 10/16/2015   CL 104 10/16/2015   CREATININE 0.79 10/16/2015   BUN 12 10/16/2015   CO2 30 10/16/2015   TSH 0.82 10/16/2015    Dg Chest 2 View  03/31/2015  CLINICAL DATA:  Chest pain 1 day. EXAM: CHEST  2 VIEW COMPARISON:  08/14/2013 FINDINGS: Lungs are adequately inflated without consolidation or effusion. Stable borderline cardiomegaly. Minimal degenerative change of the spine. IMPRESSION: No active cardiopulmonary disease. Electronically Signed   By: Marin Olp M.D.   On: 03/31/2015 16:50    Assessment & Plan:   There are no diagnoses linked to this encounter. I have discontinued Ms. Kegg's morphine, morphine, cephALEXin, and morphine. I have also changed her morphine. Additionally, I am having her start on Diclofenac Sodium. Lastly, I am having her maintain her pseudoephedrine, omeprazole, potassium chloride, Ketoprofen, telmisartan-hydrochlorothiazide, amLODipine, Vitamin D (Ergocalciferol), Vitamin D3, Vitamin B-12, benzonatate, methylPREDNISolone, fluticasone, omeprazole, and oxyCODONE.  Meds ordered this encounter  Medications  . fluticasone (FLONASE) 50 MCG/ACT nasal spray    Sig: daily.  Marland Kitchen omeprazole (PRILOSEC) 40 MG capsule    Sig: Take 1 capsule  by mouth daily.  Marland Kitchen DISCONTD: morphine (AVINZA) 60 MG 24 hr capsule    Sig: Take 1 capsule (60 mg total) by mouth daily. Please fill on or after 12/21/15    Dispense:  30 capsule    Refill:  0  . DISCONTD: oxyCODONE (ROXICODONE) 15 MG immediate release tablet    Sig: Take 1 tablet (15 mg total) by mouth every 8 (eight) hours as needed for pain.    Dispense:  90 tablet    Refill:  0    Please fill on or after 12/21/15  . DISCONTD: morphine (AVINZA) 60 MG 24 hr capsule    Sig: Take 1 capsule (60 mg total) by mouth daily. Please fill on or after 01/21/16    Dispense:  30 capsule    Refill:  0  . DISCONTD: oxyCODONE (ROXICODONE) 15 MG immediate release tablet    Sig: Take 1 tablet (15 mg total) by mouth 2 (two) times daily as needed for pain.    Dispense:  90 tablet    Refill:  0    Please fill on or after 01/21/16  .  oxyCODONE (ROXICODONE) 15 MG immediate release tablet    Sig: Take 1 tablet (15 mg total) by mouth 2 (two) times daily as needed for pain.    Dispense:  90 tablet    Refill:  0    Please fill on or after 02/20/16  . morphine (AVINZA) 60 MG 24 hr capsule    Sig: Take 1 capsule (60 mg total) by mouth daily. Please fill on or after 02/20/16    Dispense:  30 capsule    Refill:  0  . Diclofenac Sodium (PENNSAID) 1.5 % SOLN    Sig: Place 0.3 mLs onto the skin 4 (four) times daily as needed.    Dispense:  150 mL    Refill:  6     Follow-up: No Follow-up on file.  Walker Kehr, MD

## 2015-12-15 NOTE — Progress Notes (Signed)
Pre visit review using our clinic review tool, if applicable. No additional management support is needed unless otherwise documented below in the visit note. 

## 2015-12-15 NOTE — Assessment & Plan Note (Signed)
[place] 

## 2015-12-27 ENCOUNTER — Other Ambulatory Visit: Payer: Self-pay | Admitting: Internal Medicine

## 2016-02-04 ENCOUNTER — Ambulatory Visit (INDEPENDENT_AMBULATORY_CARE_PROVIDER_SITE_OTHER): Payer: Federal, State, Local not specified - PPO | Admitting: Internal Medicine

## 2016-02-04 ENCOUNTER — Encounter: Payer: Self-pay | Admitting: Internal Medicine

## 2016-02-04 VITALS — BP 130/70 | HR 88 | Wt 166.0 lb

## 2016-02-04 DIAGNOSIS — R1011 Right upper quadrant pain: Secondary | ICD-10-CM | POA: Diagnosis not present

## 2016-02-04 DIAGNOSIS — M7631 Iliotibial band syndrome, right leg: Secondary | ICD-10-CM | POA: Diagnosis not present

## 2016-02-04 DIAGNOSIS — M7630 Iliotibial band syndrome, unspecified leg: Secondary | ICD-10-CM | POA: Insufficient documentation

## 2016-02-04 DIAGNOSIS — G5701 Lesion of sciatic nerve, right lower limb: Secondary | ICD-10-CM | POA: Diagnosis not present

## 2016-02-04 MED ORDER — TIZANIDINE HCL 4 MG PO TABS: 4.0000 mg | ORAL_TABLET | Freq: Three times a day (TID) | ORAL | Status: DC | PRN

## 2016-02-04 NOTE — Progress Notes (Signed)
Subjective:  Patient ID: Karina Newman, female    DOB: [birth year]  Age: 60 y.o. MRN: [id number]  CC: No chief complaint on file.   HPI Karina Newman presents for R hip posterior leg, buttock pain after a fall in Jan 2017 - pain is severe; worse over time. Pain meds don't help... C/o RUQ pain off and on x months  Outpatient Prescriptions Prior to Visit  Medication Sig Dispense Refill  . amLODipine (NORVASC) 10 MG tablet Take 1 tablet (10 mg total) by mouth daily. 90 tablet 3  . benzonatate (TESSALON) 100 MG capsule Take 1 capsule (100 mg total) by mouth 2 (two) times daily as needed for cough. 20 capsule 0  . Cholecalciferol (VITAMIN D3) 2000 units capsule Take 1 capsule (2,000 Units total) by mouth daily. 100 capsule 3  . Cyanocobalamin (VITAMIN B-12) 500 MCG SUBL 1 sl qd 100 tablet 5  . Diclofenac Sodium (PENNSAID) 1.5 % SOLN Place 0.3 mLs onto the skin 4 (four) times daily as needed. 150 mL 6  . fluticasone (FLONASE) 50 MCG/ACT nasal spray daily.    . Ketoprofen 10 % CREA APPLY 1-3G DAILY THREE TIMES DAILY. 120 g 3  . methylPREDNISolone (MEDROL DOSEPAK) 4 MG TBPK tablet As directed 21 tablet 0  . morphine (AVINZA) 60 MG 24 hr capsule Take 1 capsule (60 mg total) by mouth daily. Please fill on or after 02/20/16 30 capsule 0  . omeprazole (PRILOSEC OTC) 20 MG tablet Take 1 tablet (20 mg total) by mouth daily. 30 tablet 0  . omeprazole (PRILOSEC) 40 MG capsule Take 1 capsule by mouth daily.    Marland Kitchen oxyCODONE (ROXICODONE) 15 MG immediate release tablet Take 1 tablet (15 mg total) by mouth 2 (two) times daily as needed for pain. 90 tablet 0  . potassium chloride (KLOR-CON 10) 10 MEQ tablet Take 1 tablet (10 mEq total) by mouth 2 (two) times daily. 60 tablet 1  . pseudoephedrine (SUDAFED) 120 MG 12 hr tablet Take 1 tablet (120 mg total) by mouth 2 (two) times daily as needed for congestion. 30 tablet 1  . telmisartan-hydrochlorothiazide (MICARDIS HCT) 80-25 MG tablet Take 1 tablet by mouth daily.  90 tablet 3  . Vitamin D, Ergocalciferol, (DRISDOL) 50000 units CAPS capsule TAKE 1 CAPSULE (50,000 UNITS TOTAL) BY MOUTH EVERY 7 (SEVEN) DAYS. 8 capsule 0   No facility-administered medications prior to visit.    ROS Review of Systems  Constitutional: Positive for fatigue. Negative for chills, activity change, appetite change and unexpected weight change.  HENT: Negative for congestion, mouth sores and sinus pressure.   Eyes: Negative for visual disturbance.  Respiratory: Negative for cough and chest tightness.   Gastrointestinal: Positive for abdominal pain. Negative for nausea.  Genitourinary: Negative for frequency, difficulty urinating and vaginal pain.  Musculoskeletal: Positive for arthralgias, gait problem and neck pain. Negative for back pain.  Skin: Negative for pallor and rash.  Neurological: Negative for dizziness, tremors, weakness, numbness and headaches.  Psychiatric/Behavioral: Negative for confusion and sleep disturbance.    Objective:  BP 130/70 mmHg  Pulse 88  Wt 166 lb (75.297 kg)  SpO2 95%  BP Readings from Last 3 Encounters:  02/04/16 130/70  12/15/15 140/80  11/05/15 128/78    Wt Readings from Last 3 Encounters:  02/04/16 166 lb (75.297 kg)  12/15/15 165 lb (74.844 kg)  11/05/15 165 lb (74.844 kg)    Physical Exam  Constitutional: She appears well-developed. No distress.  HENT:  Head: Normocephalic.  Right Ear:  External ear normal.  Left Ear: External ear normal.  Nose: Nose normal.  Mouth/Throat: Oropharynx is clear and moist.  Eyes: Conjunctivae are normal. Pupils are equal, round, and reactive to light. Right eye exhibits no discharge. Left eye exhibits no discharge.  Neck: Normal range of motion. Neck supple. No JVD present. No tracheal deviation present. No thyromegaly present.  Cardiovascular: Normal rate, regular rhythm and normal heart sounds.   Pulmonary/Chest: No stridor. No respiratory distress. She has no wheezes.  Abdominal: Soft.  Bowel sounds are normal. She exhibits no distension and no mass. There is no tenderness. There is no rebound and no guarding.  Musculoskeletal: She exhibits tenderness. She exhibits no edema.  Lymphadenopathy:    She has no cervical adenopathy.  Neurological: She displays normal reflexes. No cranial nerve deficit. She exhibits normal muscle tone. Coordination normal.  Skin: No rash noted. No erythema.  Psychiatric: She has a normal mood and affect. Her behavior is normal. Judgment and thought content normal.  Tender R buttock and R IT band Str leg elev is (-)  Lab Results  Component Value Date   WBC 5.3 10/16/2015   HGB 13.0 10/16/2015   HCT 39.5 10/16/2015   PLT 338.0 10/16/2015   GLUCOSE 101* 10/16/2015   CHOL 171 10/16/2015   TRIG 119.0 10/16/2015   HDL 42.20 10/16/2015   LDLCALC 105* 10/16/2015   ALT 17 10/16/2015   AST 15 10/16/2015   NA 141 10/16/2015   K 3.8 10/16/2015   CL 104 10/16/2015   CREATININE 0.79 10/16/2015   BUN 12 10/16/2015   CO2 30 10/16/2015   TSH 0.82 10/16/2015    Dg Chest 2 View  03/31/2015  CLINICAL DATA:  Chest pain 1 day. EXAM: CHEST  2 VIEW COMPARISON:  08/14/2013 FINDINGS: Lungs are adequately inflated without consolidation or effusion. Stable borderline cardiomegaly. Minimal degenerative change of the spine. IMPRESSION: No active cardiopulmonary disease. Electronically Signed   By: Marin Olp M.D.   On: 03/31/2015 16:50    Assessment & Plan:   There are no diagnoses linked to this encounter. I am having Ms. Leiber maintain her pseudoephedrine, omeprazole, potassium chloride, Ketoprofen, telmisartan-hydrochlorothiazide, amLODipine, Vitamin D3, Vitamin B-12, benzonatate, methylPREDNISolone, fluticasone, omeprazole, oxyCODONE, morphine, Diclofenac Sodium, and Vitamin D (Ergocalciferol).  No orders of the defined types were placed in this encounter.     Follow-up: No Follow-up on file.  Walker Kehr, MD

## 2016-02-04 NOTE — Assessment & Plan Note (Signed)
R hip posterior leg, buttock pain after a fall in Jan 2017

## 2016-02-04 NOTE — Assessment & Plan Note (Signed)
?  etiology. R/o GS Abd [place] GI ref

## 2016-02-04 NOTE — Progress Notes (Signed)
Pre visit review using our clinic review tool, if applicable. No additional management support is needed unless otherwise documented below in the visit note. 

## 2016-02-04 NOTE — Patient Instructions (Signed)
Iliotibial Band Syndrome With Rehab The iliotibial (IT) band is a tendon that connects the hip muscles to the shinbone (tibia) and to one of the bones of the pelvis (ileum). The IT band passes by the knee and is often irritated by the outer portion of the knee (lateral femoral condyle). A fluid filled sac (bursa) exists between the tendon and the bone, to cushion and reduce friction. Overuse of the tendon may cause excessive friction, which results in IT band syndrome. This condition involves inflammation of the bursa (bursitis) and/or inflammation of the IT band (tendinitis). SYMPTOMS   Pain, tenderness, swelling, warmth, or redness over the IT band, at the outer knee (above the joint).  Pain that travels up or down the thigh or leg.  Initially, pain at the beginning of an exercise, that decreases once warmed up. Eventually, pain throughout the activity, getting worse as the activity continues. May cause the athlete to stop in the middle of training or competing.  Pain that gets worse when running down hills or stairs, on banked tracks, or next to the curb on the street.  Pain that increases when the foot of the affected leg hits the ground.  Possibly, a crackling sound (crepitation) when the tendon or bursa is moved or touched. CAUSES  IT band syndrome is caused by irritation of the IT band and the underlying bursa. This eventually results in inflammation and pain. IT band syndrome is an overuse injury.  RISK INCREASES WITH:  Sports with repetitive knee-bending activities (distance running, cycling).  Incorrect training techniques, including sudden changes in the intensity, frequency, or duration of training.  Not enough rest between workouts.  Poor strength and flexibility, especially a tight IT band.  Failure to warm up properly before activity.  Bow legs.  Arthritis of the knee. PREVENTION   Warm up and stretch properly before activity.  Allow for adequate recovery between  workouts.  Maintain physical fitness:  Strength, flexibility, and endurance.  Cardiovascular fitness.  Learn and use proper training technique, including reducing running mileage, shortening stride, and avoiding running on hills and banked surfaces.  Wear arch supports (orthotics), if you have flat feet. PROGNOSIS  If treated properly, IT band syndrome usually goes away within 6 weeks of treatment. RELATED COMPLICATIONS   Longer healing time, if not properly treated, or if not given enough time to heal.  Recurring inflammation of the tendon and bursa, that may result in a chronic condition.  Recurring symptoms, if activity is resumed too soon, with overuse, with a direct blow, or with poor training technique.  Inability to complete training or competition. TREATMENT  Treatment first involves the use of ice and medicine, to reduce pain and inflammation. The use of strengthening and stretching exercises may help reduce pain with activity. These exercises may be performed at home or with a therapist. For individuals with flat feet, an arch support (orthotic) may be helpful. Some individuals find that wearing a knee sleeve or compression bandage around the knee during workouts provides some relief. Certain training techniques, such as adjusting stride length, avoiding running on hills or stairs, changing the direction you run on a circular or banked track, or changing the side of the road you run on, if you run next to the curb, may help decrease symptoms of IT band syndrome. Cyclists may need to change the seat height or foot position on their bicycles. An injection of cortisone into the bursa may be recommended. Surgery to remove the inflamed bursa and/or part   of the IT band is only considered after at least 6 months of non-surgical treatment.  MEDICATION   If pain medicine is needed, nonsteroidal anti-inflammatory medicines (aspirin and ibuprofen), or other minor pain relievers  (acetaminophen), are often advised.  Do not take pain medicine for 7 days before surgery.  Prescription pain relievers may be given, if your caregiver thinks they are needed. Use only as directed and only as much as you need.  Corticosteroid injections may be given by your caregiver. These injections should be reserved for the most serious cases, because they may only be given a certain number of times. HEAT AND COLD  Cold treatment (icing) should be applied for 10 to 15 minutes every 2 to 3 hours for inflammation and pain, and immediately after activity that aggravates your symptoms. Use ice packs or an ice massage.  Heat treatment may be used before performing stretching and strengthening activities prescribed by your caregiver, physical therapist, or athletic trainer. Use a heat pack or a warm water soak. SEEK MEDICAL CARE IF:   Symptoms get worse or do not improve in 2 to 4 weeks, despite treatment.  New, unexplained symptoms develop. (Drugs used in treatment may produce side effects.) EXERCISES  RANGE OF MOTION (ROM) AND STRETCHING EXERCISES - Iliotibial Band Syndrome These exercises may help you when beginning to rehabilitate your injury. Your symptoms may go away with or without further involvement from your physician, physical therapist or athletic trainer. While completing these exercises, remember:   Restoring tissue flexibility helps normal motion to return to the joints. This allows healthier, less painful movement and activity.  An effective stretch should be held for at least 30 seconds.  A stretch should never be painful. You should only feel a gentle lengthening or release in the stretched tissue. STRETCH - Quadriceps, Prone   Lie on your stomach on a firm surface, such as a bed or padded floor.  Bend your right / left knee and grasp your ankle. If you are unable to reach your ankle or pant leg, use a belt around your foot to lengthen your reach.  Gently pull your heel  toward your buttocks. Your knee should not slide out to the side. You should feel a stretch in the front of your thigh and knee.  Hold this position for __________ seconds. Repeat __________ times. Complete this stretch __________ times per day.  STRETCH - Iliotibial Band  On the floor or bed, lie on your side, so your right / left leg is on top. Bend your knee and grab your ankle.  Slowly bring your knee back so that your thigh is in line with your trunk. Keep your heel at your buttocks and gently arch your back, so your head, shoulders and hips line up.  Slowly lower your leg so that your knee approaches the floor or bed, until you feel a gentle stretch on the outside of your right / left thigh. If you do not feel a stretch and your knee will not fall farther, place the heel of your opposite foot on top of your knee, and pull your thigh down farther.  Hold this stretch for __________ seconds. Repeat __________ times. Complete this stretch __________ times per day. STRENGTHENING EXERCISES - Iliotibial Band Syndrome Improving the flexibility of the IT band will best relieve your discomfort due to IT band syndrome. Strengthening exercises, however, can help improve both muscle endurance and joint mechanics, reducing the factors that can contribute to this condition. Your physician, physical   therapist or athletic trainer may provide you with exercises that train specific muscle groups that are especially weak. The following exercises target muscles that are often weak in people who have IT band syndrome. STRENGTH - Hip Abductors, Straight Leg Raises  Be aware of your form throughout the entire exercise, so that you exercise the correct muscles. Poor form means that you are not strengthening the correct muscles.  Lie on your side, so that your head, shoulders, knee and hip line up. You may bend your lower knee to help maintain your balance. Your right / left leg should be on top.  Roll your hips  slightly forward, so that your hips are stacked directly over each other and your right / left knee is facing forward.  Lift your top leg up 4-6 inches, leading with your heel. Be sure that your foot does not drift forward and that your knee does not roll toward the ceiling.  Hold this position for __________ seconds. You should feel the muscles in your outer hip lifting (you may not notice this until your leg begins to tire).  Slowly lower your leg to the starting position. Allow the muscles to fully relax before beginning the next repetition. Repeat __________ times. Complete this exercise __________ times per day.  STRENGTH - Quad/VMO, Isometric  Sit in a chair with your right / left knee slightly bent. With your fingertips, feel the VMO muscle (just above the inside of your knee). The VMO is important in controlling the position of your kneecap.  Keeping your fingertips on this muscle. Without actually moving your leg, attempt to drive your knee down, as if straightening your leg. You should feel your VMO tense. If you have a difficult time, you may wish to try the same exercise on your healthy knee first.  Tense this muscle as hard as you can, without increasing any knee pain.  Hold for __________ seconds. Relax the muscles slowly and completely between each repetition. Repeat __________ times. Complete this exercise __________ times per day.    This information is not intended to replace advice given to you by your health care provider. Make sure you discuss any questions you have with your health care provider.   Document Released: 08/02/2005 Document Revised: 08/23/2014 Document Reviewed: 11/14/2008 [employer] Interactive Patient Education 2016 [employer]   Piriformis Syndrome With Rehab Piriformis syndrome is a condition the affects the nervous system in the area of the hip, and is characterized by pain and possibly a loss of feeling in the backside (posterior) thigh that may  extend down the entire length of the leg. The symptoms are caused by an increase in pressure on the sciatic nerve by the piriformis muscle, which is on the back of the hip and is responsible for externally rotating the hip. The sciatic nerve and its branches connect to much of the leg. Normally the sciatic nerve runs between the piriformis muscle and other muscles. However, in certain individuals the nerve runs through the muscle, which causes an increase in pressure on the nerve and results in the symptoms of piriformis syndrome. SYMPTOMS   Pain, tingling, numbness, or burning in the back of the thigh that may also extend down the entire leg.  Occasionally, tenderness in the buttock.  Loss of function of the leg.  Pain that worsens when using the piriformis muscle (running, jumping, or stairs).  Pain that increases with prolonged sitting.  Pain that is lessened by lying flat on the back. CAUSES  Piriformis syndrome is the result of an increase in pressure placed on the sciatic nerve. Oftentimes, piriformis syndrome is an overuse injury.  Stress placed on the nerve from a sudden increase in the intensity, frequency, or duration of training.  Compensation of other extremity injuries. RISK INCREASES WITH:  Sports that involve the piriformis muscle (running, walking, or jumping).  You are born with (congenital) a defect in which the sciatic nerve passes through the muscle. PREVENTION  Warm up and stretch properly before activity.  Allow for adequate recovery between workouts.  Maintain physical fitness:  Strength, flexibility, and endurance.  Cardiovascular fitness. PROGNOSIS  If treated properly, the symptoms of piriformis syndrome usually resolve in 2 to 6 weeks. RELATED COMPLICATIONS   Persistent and possibly permanent pain and numbness in the lower extremity.  Weakness of the extremity that may progress to disability and inability to compete. TREATMENT  The most  effective treatment for piriformis syndrome is rest from any activities that aggravate the symptoms. Ice and pain medication may help reduce pain and inflammation. The use of strengthening and stretching exercises may help reduce pain with activity. These exercises may be performed at home or with a therapist. A referral to a therapist may be given for further evaluation and treatment, such as ultrasound. Corticosteroid injections may be given to reduce inflammation that is causing pressure to be placed on the sciatic nerve. If nonsurgical (conservative) treatment is unsuccessful, then surgery may be recommended.  MEDICATION   If pain medication is necessary, then nonsteroidal anti-inflammatory medications, such as aspirin and ibuprofen, or other minor pain relievers, such as acetaminophen, are often recommended.  Do not take pain medication for 7 days before surgery.  Prescription pain relievers may be given if deemed necessary by your caregiver. Use only as directed and only as much as you need.  Corticosteroid injections may be given by your caregiver. These injections should be reserved for the most serious cases, because they may only be given a certain number of times. HEAT AND COLD:   Cold treatment (icing) relieves pain and reduces inflammation. Cold treatment should be applied for 10 to 15 minutes every 2 to 3 hours for inflammation and pain and immediately after any activity that aggravates your symptoms. Use ice packs or massage the area with a piece of ice (ice massage).  Heat treatment may be used prior to performing the stretching and strengthening activities prescribed by your caregiver, physical therapist, or athletic trainer. Use a heat pack or soak the injury in warm water. SEEK IMMEDIATE MEDICAL CARE IF:  Treatment seems to offer no benefit, or the condition worsens.  Any medications produce adverse side effects. EXERCISES RANGE OF MOTION (ROM) AND STRETCHING EXERCISES -  Piriformis Syndrome These exercises may help you when beginning to rehabilitate your injury. Your symptoms may resolve with or without further involvement from your physician, physical therapist, or athletic trainer. While completing these exercises, remember:   Restoring tissue flexibility helps normal motion to return to the joints. This allows healthier, less painful movement and activity.  An effective stretch should be held for at least 30 seconds.  A stretch should never be painful. You should only feel a gentle lengthening or release in the stretched tissue. STRETCH - Hip Rotators  Lie on your back on a firm surface. Grasp your right / left knee with your right / left hand and your ankle with your opposite hand.  Keeping your hips and shoulders firmly planted, gently pull your right / left  knee and rotate your lower leg toward your opposite shoulder until you feel a stretch in your buttocks.  Hold this stretch for __________ seconds. Repeat this stretch __________ times. Complete this stretch __________ times per day. STRETCH - Iliotibial Band  On the floor or bed, lie on your side so your right / left leg is on top. Bend your knee and grab your ankle.  Slowly bring your knee back so that your thigh is in line with your trunk. Keep your heel at your buttocks and gently arch your back so your head, shoulders, and hips line up.  Slowly lower your leg so that your knee approaches the floor/bed until you feel a gentle stretch on the outside of your right / left thigh. If you do not feel a stretch and your knee will not fall farther, place the heel of your opposite foot on top of your knee and pull your thigh down farther.  Hold this stretch for __________ seconds. Repeat __________ times. Complete __________ times per day. STRENGTHENING EXERCISES - Piriformis Syndrome  These are some of the caregiver again or until your symptoms are resolved. Remember:   Strong muscles with good  endurance tolerate stress better.  Do the exercises as initially prescribed by your caregiver. Progress slowly with each exercise, gradually increasing the number of repetitions and weight used under their guidance. STRENGTH - Hip Abductors, Straight Leg Raises Be aware of your form throughout the entire exercise so that you exercise the correct muscles. Sloppy form means that you are not strengthening the correct muscles.  Lie on your side so that your head, shoulders, knee, and hip line up. You may bend your lower knee to help maintain your balance. Your right / left leg should be on top.  Roll your hips slightly forward, so that your hips are stacked directly over each other and your right / left knee is facing forward.  Lift your top leg up 4-6 inches, leading with your heel. Be sure that your foot does not drift forward or that your knee does not roll toward the ceiling.  Hold this position for __________ seconds. You should feel the muscles in your outer hip lifting (you may not notice this until your leg begins to tire).  Slowly lower your leg to the starting position. Allow the muscles to fully relax before beginning the next repetition. Repeat __________ times. Complete this exercise __________ times per day.  STRENGTH - Hip Abductors, Quadruped  On a firm, lightly padded surface, position yourself on your hands and knees. Your hands should be directly below your shoulders and your knees should be directly below your hips.  Keeping your right / left knee bent, lift your leg out to the side. Keep your legs level and in line with your shoulders.  Position yourself on your hands and knees.  Hold for __________ seconds.  Keeping your trunk steady and your hips level, slowly lower your leg to the starting position. Repeat __________ times. Complete this exercise __________ times per day.  STRENGTH - Hip Abductors, Standing  Tie one end of a rubber exercise band/tubing to a secure  surface (table, pole) and tie a loop at the other end.  Place the loop around your right / left ankle. Keeping your ankle with the band directly opposite of the secured end, step away until there is tension in the tube/band.  Hold onto a chair as needed for balance.  Keeping your back upright, your shoulders over your hips, and  your toes pointing forward, lift your right / left leg out to your side. Be sure to lift your leg with your hip muscles. Do not "throw" your leg or tip your body to lift your leg.  Slowly and with control, return to the starting position. Repeat exercise __________ times. Complete this exercise __________ times per day.    This information is not intended to replace advice given to you by your health care provider. Make sure you discuss any questions you have with your health care provider.   Document Released: 08/02/2005 Document Revised: 12/17/2014 Document Reviewed: 11/14/2008 Else[employer] Interactive Patient Education [employer].

## 2016-02-12 ENCOUNTER — Encounter: Payer: Self-pay | Admitting: Gastroenterology

## 2016-02-26 ENCOUNTER — Other Ambulatory Visit: Payer: Self-pay

## 2016-03-04 ENCOUNTER — Ambulatory Visit
Admission: RE | Admit: 2016-03-04 | Discharge: 2016-03-04 | Disposition: A | Discharge status: Home / Self Care | Payer: Federal, State, Local not specified - PPO | Source: Ambulatory Visit | Attending: Internal Medicine | Admitting: Internal Medicine

## 2016-03-16 ENCOUNTER — Encounter: Payer: Self-pay | Admitting: Internal Medicine

## 2016-03-16 ENCOUNTER — Ambulatory Visit (INDEPENDENT_AMBULATORY_CARE_PROVIDER_SITE_OTHER): Admitting: Internal Medicine

## 2016-03-16 DIAGNOSIS — S43431S Superior glenoid labrum lesion of right shoulder, sequela: Secondary | ICD-10-CM

## 2016-03-16 DIAGNOSIS — M5010 Cervical disc disorder with radiculopathy, unspecified cervical region: Secondary | ICD-10-CM | POA: Diagnosis not present

## 2016-03-16 DIAGNOSIS — E559 Vitamin D deficiency, unspecified: Secondary | ICD-10-CM | POA: Diagnosis not present

## 2016-03-16 DIAGNOSIS — R1011 Right upper quadrant pain: Secondary | ICD-10-CM

## 2016-03-16 DIAGNOSIS — E538 Deficiency of other specified B group vitamins: Secondary | ICD-10-CM | POA: Insufficient documentation

## 2016-03-16 MED ORDER — OXYCODONE HCL 15 MG PO TABS: 15.0000 mg | ORAL_TABLET | Freq: Two times a day (BID) | ORAL | 0 refills | Status: DC | PRN

## 2016-03-16 MED ORDER — MORPHINE SULFATE ER BEADS 60 MG PO CP24: 60.0000 mg | ORAL_CAPSULE | Freq: Every day | ORAL | 0 refills | Status: DC

## 2016-03-16 NOTE — Assessment & Plan Note (Signed)
On po B12 

## 2016-03-16 NOTE — Assessment & Plan Note (Addendum)
Pain meds Rx were reniewed

## 2016-03-16 NOTE — Assessment & Plan Note (Signed)
On Vit D 

## 2016-03-16 NOTE — Progress Notes (Signed)
Pre visit review using our clinic review tool, if applicable. No additional management support is needed unless otherwise documented below in the visit note. 

## 2016-03-16 NOTE — Assessment & Plan Note (Signed)
Not better. GI appt is pending in Sept - will move up

## 2016-03-16 NOTE — Assessment & Plan Note (Signed)
Pain meds Rx were reniewed

## 2016-03-16 NOTE — Progress Notes (Signed)
Subjective:  Patient ID: Karina Newman, female    DOB: 07-20-1956  Age: 60 y.o. MRN: [id number]  CC: No chief complaint on file.   HPI Karina Newman presents for chronic cervical and shoulder pain f/u. C/o RUQ abd pain - not better  OBJECTIVE:   Outpatient Medications Prior to Visit  Medication Sig Dispense Refill  . amLODipine (NORVASC) 10 MG tablet Take 1 tablet (10 mg total) by mouth daily. 90 tablet 3  . benzonatate (TESSALON) 100 MG capsule Take 1 capsule (100 mg total) by mouth 2 (two) times daily as needed for cough. 20 capsule 0  . Cholecalciferol (VITAMIN D3) 2000 units capsule Take 1 capsule (2,000 Units total) by mouth daily. 100 capsule 3  . Cyanocobalamin (VITAMIN B-12) 500 MCG SUBL 1 sl qd 100 tablet 5  . Diclofenac Sodium (PENNSAID) 1.5 % SOLN Place 0.3 mLs onto the skin 4 (four) times daily as needed. 150 mL 6  . fluticasone (FLONASE) 50 MCG/ACT nasal spray daily.    . Ketoprofen 10 % CREA APPLY 1-3G DAILY THREE TIMES DAILY. 120 g 3  . methylPREDNISolone (MEDROL DOSEPAK) 4 MG TBPK tablet As directed 21 tablet 0  . morphine (AVINZA) 60 MG 24 hr capsule Take 1 capsule (60 mg total) by mouth daily. Please fill on or after 02/20/16 30 capsule 0  . omeprazole (PRILOSEC OTC) 20 MG tablet Take 1 tablet (20 mg total) by mouth daily. 30 tablet 0  . omeprazole (PRILOSEC) 40 MG capsule Take 1 capsule by mouth daily.    Marland Kitchen oxyCODONE (ROXICODONE) 15 MG immediate release tablet Take 1 tablet (15 mg total) by mouth 2 (two) times daily as needed for pain. 90 tablet 0  . potassium chloride (KLOR-CON 10) 10 MEQ tablet Take 1 tablet (10 mEq total) by mouth 2 (two) times daily. 60 tablet 1  . pseudoephedrine (SUDAFED) 120 MG 12 hr tablet Take 1 tablet (120 mg total) by mouth 2 (two) times daily as needed for congestion. 30 tablet 1  . telmisartan-hydrochlorothiazide (MICARDIS HCT) 80-25 MG tablet Take 1 tablet by mouth daily. 90 tablet 3  . tiZANidine (ZANAFLEX) 4 MG tablet Take 1 tablet (4  mg total) by mouth every 8 (eight) hours as needed for muscle spasms. 60 tablet 1  . Vitamin D, Ergocalciferol, (DRISDOL) 50000 units CAPS capsule TAKE 1 CAPSULE (50,000 UNITS TOTAL) BY MOUTH EVERY 7 (SEVEN) DAYS. 8 capsule 0   No facility-administered medications prior to visit.     ROS Review of Systems  Constitutional: Negative for activity change, appetite change, chills, fatigue and unexpected weight change.  HENT: Negative for congestion, mouth sores and sinus pressure.   Eyes: Negative for visual disturbance.  Respiratory: Negative for cough and chest tightness.   Gastrointestinal: Positive for abdominal pain. Negative for nausea.  Genitourinary: Negative for difficulty urinating, frequency and vaginal pain.  Musculoskeletal: Positive for arthralgias, neck pain and neck stiffness. Negative for back pain and gait problem.  Skin: Negative for pallor and rash.  Neurological: Negative for dizziness, tremors, weakness, numbness and headaches.  Psychiatric/Behavioral: Negative for confusion and sleep disturbance.    Objective:  BP 124/78   Pulse 92   Wt 167 lb (75.8 kg)   SpO2 94%   BMI 28.67 kg/m   BP Readings from Last 3 Encounters:  03/16/16 124/78  02/04/16 130/70  12/15/15 140/80    Wt Readings from Last 3 Encounters:  03/16/16 167 lb (75.8 kg)  02/04/16 166 lb (75.3 kg)  12/15/15 165 lb (  74.8 kg)    Physical Exam  Constitutional: She appears well-developed. No distress.  HENT:  Head: Normocephalic.  Right Ear: External ear normal.  Left Ear: External ear normal.  Nose: Nose normal.  Mouth/Throat: Oropharynx is clear and moist.  Eyes: Conjunctivae are normal. Pupils are equal, round, and reactive to light. Right eye exhibits no discharge. Left eye exhibits no discharge.  Neck: Normal range of motion. Neck supple. No JVD present. No tracheal deviation present. No thyromegaly present.  Cardiovascular: Normal rate, regular rhythm and normal heart sounds.     Pulmonary/Chest: No stridor. No respiratory distress. She has no wheezes.  Abdominal: Soft. Bowel sounds are normal. She exhibits no distension and no mass. There is tenderness. There is no rebound and no guarding.  Musculoskeletal: She exhibits tenderness. She exhibits no edema.  Lymphadenopathy:    She has no cervical adenopathy.  Neurological: She displays normal reflexes. No cranial nerve deficit. She exhibits normal muscle tone. Coordination normal.  Skin: No rash noted. No erythema.  Psychiatric: She has a normal mood and affect. Her behavior is normal. Judgment and thought content normal.  R shoulder/neck - tender  Lab Results  Component Value Date   WBC 5.3 10/16/2015   HGB 13.0 10/16/2015   HCT 39.5 10/16/2015   PLT 338.0 10/16/2015   GLUCOSE 101 (H) 10/16/2015   CHOL 171 10/16/2015   TRIG 119.0 10/16/2015   HDL 42.20 10/16/2015   LDLCALC 105 (H) 10/16/2015   ALT 17 10/16/2015   AST 15 10/16/2015   NA 141 10/16/2015   K 3.8 10/16/2015   CL 104 10/16/2015   CREATININE 0.79 10/16/2015   BUN 12 10/16/2015   CO2 30 10/16/2015   TSH 0.82 10/16/2015    US Abdomen Complete  Result Date: 03/04/2016 CLINICAL DATA:  Right upper quadrant pain for 2 months, worse after eating EXAM: ABDOMEN ULTRASOUND COMPLETE COMPARISON:  None. FINDINGS: Gallbladder: The gallbladder is visualized and no gallstones are noted. There is no pain over the gallbladder with compression. Common bile duct: Diameter: The common bile duct is within upper limits of normal measuring 6.3 mm in diameter. Liver: The liver is echogenic diffusely consistent with fatty infiltration with probable sparing near the gallbladder. IVC: No abnormality visualized. Pancreas: Portions of the head and tail of the pancreas are obscured by bowel gas. Spleen: The spleen measures 5.6 cm. Right Kidney: Length: 10.2 cm.  No hydronephrosis is seen. Left Kidney: Length: 10.7 cm.  No hydronephrosis is noted. Abdominal aorta: The abdominal  aorta is normal in caliber. Other findings: None. IMPRESSION: 1. Echogenic liver parenchyma consistent with diffuse fatty infiltration with probable sparing near the gallbladder. 2. No gallstones. 3. Portions of the head and tail of the pancreas are obscured by bowel gas. Electronically Signed   By: Ivar Drape M.D.   On: 03/04/2016 09:15    Assessment & Plan:   There are no diagnoses linked to this encounter. I am having Ms. Magnone maintain her pseudoephedrine, omeprazole, potassium chloride, Ketoprofen, telmisartan-hydrochlorothiazide, amLODipine, Vitamin D3, Vitamin B-12, benzonatate, methylPREDNISolone, fluticasone, omeprazole, oxyCODONE, morphine, Diclofenac Sodium, Vitamin D (Ergocalciferol), and tiZANidine.  No orders of the defined types were placed in this encounter.    Follow-up: No Follow-up on file.  Walker Kehr, MD

## 2016-03-22 ENCOUNTER — Ambulatory Visit (INDEPENDENT_AMBULATORY_CARE_PROVIDER_SITE_OTHER): Payer: Federal, State, Local not specified - PPO | Admitting: Physician Assistant

## 2016-03-22 ENCOUNTER — Encounter: Payer: Self-pay | Admitting: Physician Assistant

## 2016-03-22 VITALS — BP 150/80 | HR 84 | Ht 64.0 in | Wt 165.0 lb

## 2016-03-22 DIAGNOSIS — Z86010 Personal history of colonic polyps: Secondary | ICD-10-CM

## 2016-03-22 DIAGNOSIS — R1013 Epigastric pain: Secondary | ICD-10-CM | POA: Diagnosis not present

## 2016-03-22 DIAGNOSIS — R1031 Right lower quadrant pain: Secondary | ICD-10-CM | POA: Diagnosis not present

## 2016-03-22 DIAGNOSIS — G8929 Other chronic pain: Secondary | ICD-10-CM

## 2016-03-22 MED ORDER — SUCRALFATE 1 G PO TABS: ORAL_TABLET | ORAL | 2 refills | Status: DC

## 2016-03-22 MED ORDER — NA SULFATE-K SULFATE-MG SULF 17.5-3.13-1.6 GM/177ML PO SOLN: 1.0000 | Freq: Once | ORAL | 0 refills | Status: AC

## 2016-03-22 NOTE — Progress Notes (Signed)
Agree with assessment and plan as outlined.  

## 2016-03-22 NOTE — Patient Instructions (Addendum)
Continue the Omeprazole 40 mg.  We sent a prescription for the colonoscopy prep and the Carafate tablets. You can crush 1 tablet between 2 spoons, add some water to make a slurry to swallow.   You have been scheduled for an endoscopy and colonoscopy. Please follow the written instructions given to you at your visit today. Please pick up your prep supplies at the pharmacy within the next 1-3 days. [facility].  If you use inhalers (even only as needed), please bring them with you on the day of your procedure. Your physician has requested that you go to www.startemmi.com and enter the access code given to you at your visit today. This web site gives a general overview about your procedure. However, you should still follow specific instructions given to you by our office regarding your preparation for the procedure.  We did put you on a wait list for a sooner appointment.                  If you are age 87 or younger, your body mass index should be between 19-25. Your Body mass index is 28.32 kg/m. If this is out of the aformentioned range listed, please consider follow up with your Primary Care Provider.

## 2016-03-22 NOTE — Progress Notes (Addendum)
Subjective:    Patient ID: Karina Newman, female    DOB: [birth year], 60 y.o.   MRN: [id number]  HPI Karina Newman is a pleasant 60 year old African-American female, new to GI today referred by Dr. Alain Marion for evaluation of epigastric and right-sided abdominal pain. Patient has history of hypertension, B12 deficiency vitamin D deficiency and chronic neck and shoulder pain. She had had previous GI evaluation done in [place] about 5 years ago and we do have copies of those reports. She had colonoscopy in December 2012 with finding of internal hemorrhoids and 1 tubular adenoma was removed. EGD done at that site same time showed mild chronic gastritis H. pylori negative Can says that she has been having epigastric pain over the past couple of months which she describes as a fairly constant crampy gassy-type pain and also has been having right mid abdominal pain for 2 months. She does not feel that either of these pains is exacerbated by by mouth intake the right mid pain does seem to improve some after bowel movements. She has mild constipation has been using a fiber supplement with good results no melena or hematochezia. She has been on omeprazole chronically. She takes baby aspirin daily and then intermittent Excedrin for headaches though not on a daily basis. Recent upper abdominal ultrasound revealed a fatty liver no gallstones or ductal dilation. Recent labs with CBC and CMET  Unremarkable.  Review of Systems Pertinent positive and negative review of systems were noted in the above HPI section.  All other review of systems was otherwise negative.  Outpatient Encounter Prescriptions as of 03/22/2016  Medication Sig  . amLODipine (NORVASC) 10 MG tablet Take 1 tablet (10 mg total) by mouth daily.  . benzonatate (TESSALON) 100 MG capsule Take 1 capsule (100 mg total) by mouth 2 (two) times daily as needed for cough.  . Cholecalciferol (VITAMIN D3) 2000 units capsule Take 1 capsule (2,000 Units total) by  mouth daily.  . Cyanocobalamin (VITAMIN B-12) 500 MCG SUBL 1 sl qd  . Diclofenac Sodium (PENNSAID) 1.5 % SOLN Place 0.3 mLs onto the skin 4 (four) times daily as needed.  . fluticasone (FLONASE) 50 MCG/ACT nasal spray daily.  Marland Kitchen morphine (AVINZA) 60 MG 24 hr capsule Take 1 capsule (60 mg total) by mouth daily. Please fill on or after 05/22/16  . omeprazole (PRILOSEC) 40 MG capsule Take 1 capsule by mouth daily.  Marland Kitchen oxyCODONE (ROXICODONE) 15 MG immediate release tablet Take 1 tablet (15 mg total) by mouth 2 (two) times daily as needed for pain.  . pseudoephedrine (SUDAFED) 120 MG 12 hr tablet Take 1 tablet (120 mg total) by mouth 2 (two) times daily as needed for congestion.  Marland Kitchen telmisartan-hydrochlorothiazide (MICARDIS HCT) 80-25 MG tablet Take 1 tablet by mouth daily.  Marland Kitchen tiZANidine (ZANAFLEX) 4 MG tablet Take 1 tablet (4 mg total) by mouth every 8 (eight) hours as needed for muscle spasms.  . Vitamin D, Ergocalciferol, (DRISDOL) 50000 units CAPS capsule TAKE 1 CAPSULE (50,000 UNITS TOTAL) BY MOUTH EVERY 7 (SEVEN) DAYS.  . Na Sulfate-K Sulfate-Mg Sulf 17.5-3.13-1.6 GM/180ML SOLN Take 1 kit by mouth once.  . sucralfate (CARAFATE) 1 g tablet Take 1 gram between meals  3 times daily.  . [DISCONTINUED] Ketoprofen 10 % CREA APPLY 1-3G DAILY THREE TIMES DAILY.  . [DISCONTINUED] methylPREDNISolone (MEDROL DOSEPAK) 4 MG TBPK tablet As directed  . [DISCONTINUED] omeprazole (PRILOSEC OTC) 20 MG tablet Take 1 tablet (20 mg total) by mouth daily.  . [DISCONTINUED] potassium chloride (  KLOR-CON 10) 10 MEQ tablet Take 1 tablet (10 mEq total) by mouth 2 (two) times daily.   No facility-administered encounter medications on file as of 03/22/2016.    Allergies  Allergen Reactions  . Penicillins Hives  . Gabapentin     Nausea   . Lyrica [Pregabalin] Dermatitis  . Voltaren [Diclofenac] Hives   Patient Active Problem List   Diagnosis Date Noted  . Low vitamin B12 level 03/16/2016  . Piriformis syndrome of right  side 02/04/2016  . IT band syndrome 02/04/2016  . RUQ abdominal pain 02/04/2016  . LBP radiating to right leg 09/19/2015  . Allergic rhinitis 09/19/2015  . Chest pain, atypical 04/07/2015  . GERD (gastroesophageal reflux disease) 04/07/2015  . Axillary adenitis 03/23/2015  . Migraine headache 03/23/2015  . Hives 01/29/2015  . Edema 01/29/2015  . Cervical disc disorder with radiculopathy of cervical region 12/11/2014  . Insomnia 07/31/2014  . Degenerative cervical disc 05/08/2014  . Bursitis of right shoulder 03/05/2014  . Patellofemoral arthralgia of both knees 03/01/2014  . Bilateral shoulder pain 10/18/2013  . Sinusitis, bacterial 08/20/2013  . URI, acute 08/14/2013  . Hemoptysis 08/14/2013  . Otitis media of left ear 08/14/2013  . Labral tear of shoulder 07/03/2013  . Hyperthyroidism 07/03/2013  . HTN (hypertension), benign 07/03/2013  . LVH (left ventricular hypertrophy) due to hypertensive disease 07/03/2013  . Vitamin D deficiency 07/03/2013   Social History   Social History  . Marital status: Married    Spouse name: N/A  . Number of children: 4  . Years of education: N/A   Occupational History  . RETIRED    Social History Main Topics  . Smoking status: Never Smoker  . Smokeless tobacco: Never Used  . Alcohol use Yes     Comment: social  . Drug use: No  . Sexual activity: Yes    Birth control/ protection: Post-menopausal, Surgical     Comment: BTL   Other Topics Concern  . Not on file   Social History Narrative  . No narrative on file    Karina Newman's family history includes Cancer in her father; Diabetes in her brother and paternal grandmother; Hypertension in her mother and sister; Kidney disease in her mother.      Objective:    Vitals:   03/22/16 1027  BP: (!) 150/80  Pulse: 84    Physical Exam well-developed African-American female in no acute distress, pleasant blood pressure 150/80 pulse 84, height 5 foot 4 weight 165. HEENT  ;nontraumatic normocephalic EOMI PERRLA sclera anicteric, Cardiovascular ;regular rate and rhythm with S1-S2 no murmur or gallop, Pulmonary; clear bilaterally, Abdomen ;soft she is tender in the epigastrium and right mid abdomen is no guarding or rebound no palpable mass or hepatosplenomegaly she does have a lower midline incisional scar from C-sections, Rectal; exam not done, Ext; no clubbing cyanosis or edema skin warm and dry, Neuropsych; mood and affect appropriate       Assessment & Plan:   #66 60 year old African-American female with 2 month history of both epigastric and right mid abdominal pain Etiology not clear patient has had prior history of gastritis rule out aspirin/NSAID-induced gastropathy or peptic ulcer disease Right mid abdominal pain seems to be relieved somewhat with bowel movements-rule out occult colon lesion, IBS #2 history of adenomatous colon polyps last colonoscopy 2012 done in [place] #3 hypertension #4 chronic narcotic use for chronic shoulder and neck pain #5 B-12 deficiency #6 hepatic steatosis normal LFTs    Plan; continue  omeprazole 40 mg by mouth every morning for now Add Carafate 1 g 3 times daily between meals Schedule for colonoscopy and upper endoscopy with Dr. Havery Moros. Procedures discussed in detail with patient including risks and benefits and she is agreeable to proceed Further plans pending results of above.   Amy S Esterwood PA-C 03/22/2016   Cc: Plotnikov, Evie Lacks, MD Addendum; the records received from colonoscopy done at [facility], Dr. Pearson Forster. November 2012 EGD with mild antral gastritis, and colonoscopy negative with exception of internal hemorrhoids. We'll for also received a path report from May 2008 from a colonoscopy, also done by Dr. Pearson Forster which showed 1 tubular adenoma

## 2016-03-31 ENCOUNTER — Other Ambulatory Visit: Payer: Self-pay | Admitting: Internal Medicine

## 2016-04-12 ENCOUNTER — Other Ambulatory Visit: Payer: Self-pay | Admitting: Internal Medicine

## 2016-04-20 ENCOUNTER — Ambulatory Visit: Payer: Self-pay | Admitting: Gastroenterology

## 2016-05-06 ENCOUNTER — Telehealth: Payer: Self-pay | Admitting: Internal Medicine

## 2016-05-06 ENCOUNTER — Ambulatory Visit (AMBULATORY_SURGERY_CENTER): Payer: Federal, State, Local not specified - PPO | Admitting: Gastroenterology

## 2016-05-06 ENCOUNTER — Encounter: Payer: Self-pay | Admitting: Gastroenterology

## 2016-05-06 ENCOUNTER — Telehealth: Payer: Self-pay | Admitting: Physician Assistant

## 2016-05-06 VITALS — BP 111/77 | HR 91 | Temp 97.7°F | Resp 14 | Ht 64.0 in | Wt 165.0 lb

## 2016-05-06 DIAGNOSIS — D122 Benign neoplasm of ascending colon: Secondary | ICD-10-CM

## 2016-05-06 DIAGNOSIS — Z86010 Personal history of colonic polyps: Secondary | ICD-10-CM | POA: Diagnosis not present

## 2016-05-06 DIAGNOSIS — K2950 Unspecified chronic gastritis without bleeding: Secondary | ICD-10-CM | POA: Diagnosis not present

## 2016-05-06 DIAGNOSIS — R1013 Epigastric pain: Secondary | ICD-10-CM | POA: Diagnosis not present

## 2016-05-06 MED ORDER — SODIUM CHLORIDE 0.9 % IV SOLN: 500.0000 mL | INTRAVENOUS | Status: DC

## 2016-05-06 NOTE — Telephone Encounter (Signed)
Patient is requesting a letter for workers comp (schedule ward) stating that patient has met maximum improvement and that patient is not having any treatment at all.  As well as patient takes medication for pain.  Please follow up patient in regard.

## 2016-05-06 NOTE — Op Note (Signed)
[facility] Patient Name: Karina Newman Procedure Date: 05/06/2016 12:26 PM MRN: [id number] Endoscopist: Remo Lipps P. Havery Moros , MD Age: 60 Referring MD:  Date of Birth: 1955-10-06 Gender: Female Account #: [id number] Procedure:                Upper GI endoscopy Indications:              New-onset upper abdominal symptoms in patient older                            than 50 years - epigastric and RUQ pain Medicines:                Monitored Anesthesia Care Procedure:                Pre-Anesthesia Assessment:                           - Prior to the procedure, a History and Physical                            was performed, and patient medications and                            allergies were reviewed. The patient's tolerance of                            previous anesthesia was also reviewed. The risks                            and benefits of the procedure and the sedation                            options and risks were discussed with the patient.                            All questions were answered, and informed consent                            was obtained. Prior Anticoagulants: The patient has                            taken no previous anticoagulant or antiplatelet                            agents. ASA Grade Assessment: II - A patient with                            mild systemic disease. After reviewing the risks                            and benefits, the patient was deemed in                            satisfactory condition to undergo the procedure.  After obtaining informed consent, the endoscope was                            passed under direct vision. Throughout the                            procedure, the patient's blood pressure, pulse, and                            oxygen saturations were monitored continuously. The                            Model GIF-[id number]) scope was introduced   through the mouth, and advanced to the second part                            of duodenum. The upper GI endoscopy was                            accomplished without difficulty. The patient                            tolerated the procedure well. Scope In: Scope Out: Findings:                 Esophagogastric landmarks were identified: the                            Z-line was found at 38 cm, the gastroesophageal                            junction was found at 38 cm and the upper extent of                            the gastric folds was found at 38 cm from the                            incisors.                           The exam of the esophagus was otherwise normal.                           The entire examined stomach was normal. Biopsies                            were taken with a cold forceps for Helicobacter                            pylori testing.                           The duodenal bulb and second portion of the  duodenum were normal. Complications:            No immediate complications. Estimated blood loss:                            Minimal. Estimated Blood Loss:     Estimated blood loss was minimal. Impression:               - Esophagogastric landmarks identified.                           - Normal esophagus                           - Normal stomach. Biopsied.                           - Normal duodenal bulb and second portion of the                            duodenum. Recommendation:           - Patient has a contact number available for                            emergencies. The signs and symptoms of potential                            delayed complications were discussed with the                            patient. Return to normal activities tomorrow.                            Written discharge instructions were provided to the                            patient.                           - Resume previous diet.                            - Continue present medications.                           - Await pathology results. Remo Lipps P. Armbruster, MD 05/06/2016 1:59:00 PM This report has been signed electronically.

## 2016-05-06 NOTE — Telephone Encounter (Signed)
pls write a letter Thx 

## 2016-05-06 NOTE — Telephone Encounter (Signed)
A user error has taken place: ERROR °

## 2016-05-06 NOTE — Progress Notes (Signed)
To recovery, report to [place], RN, VSS 

## 2016-05-06 NOTE — Op Note (Signed)
[facility] Patient Name: Karina Newman Procedure Date: 05/06/2016 1:32 PM MRN: [id number] Endoscopist: Remo Lipps P. Havery Moros , MD Age: 60 Referring MD:  Date of Birth: [birth year] Gender: Female Account #: [id number] Procedure:                Colonoscopy Indications:              High risk colon cancer surveillance: Personal                            history of colonic polyps, abdominal pain Medicines:                Monitored Anesthesia Care Procedure:                Pre-Anesthesia Assessment:                           - Prior to the procedure, a History and Physical                            was performed, and patient medications and                            allergies were reviewed. The patient's tolerance of                            previous anesthesia was also reviewed. The risks                            and benefits of the procedure and the sedation                            options and risks were discussed with the patient.                            All questions were answered, and informed consent                            was obtained. Prior Anticoagulants: The patient has                            taken no previous anticoagulant or antiplatelet                            agents. ASA Grade Assessment: II - A patient with                            mild systemic disease. After reviewing the risks                            and benefits, the patient was deemed in                            satisfactory condition to undergo the procedure.  After obtaining informed consent, the colonoscope                            was passed under direct vision. Throughout the                            procedure, the patient's blood pressure, pulse, and                            oxygen saturations were monitored continuously. The                            Model CF-HQ190L [id number]) scope was introduced                            through the anus  and advanced to the the terminal                            ileum, with identification of the appendiceal                            orifice and IC valve. The colonoscopy was performed                            without difficulty. The patient tolerated the                            procedure well. The quality of the bowel                            preparation was good. The terminal ileum, ileocecal                            valve, appendiceal orifice, and rectum were                            photographed. Scope In: 1:33:21 PM Scope Out: 1:50:59 PM Scope Withdrawal Time: 0 hours 12 minutes 54 seconds  Total Procedure Duration: 0 hours 17 minutes 38 seconds  Findings:                 The perianal and digital rectal examinations were                            normal.                           A 4 mm polyp was found in the ascending colon. The                            polyp was sessile. The polyp was removed with a                            cold snare. Resection and retrieval were complete.  The terminal ileum appeared normal.                           Anal papilla(e) were hypertrophied.                           Non-bleeding internal hemorrhoids were found during                            retroflexion.                           The exam was otherwise without abnormality. Complications:            No immediate complications. Estimated blood loss:                            Minimal. Estimated Blood Loss:     Estimated blood loss was minimal. Impression:               - One 4 mm polyp in the ascending colon, removed                            with a cold snare. Resected and retrieved.                           - The examined portion of the ileum was normal.                           - Anal papilla(e) were hypertrophied.                           - Non-bleeding internal hemorrhoids.                           - The examination was otherwise  normal. Recommendation:           - Patient has a contact number available for                            emergencies. The signs and symptoms of potential                            delayed complications were discussed with the                            patient. Return to normal activities tomorrow.                            Written discharge instructions were provided to the                            patient.                           - Resume previous diet.                           -  Continue present medications.                           - No ibuprofen, naproxen, or other non-steroidal                            anti-inflammatory drugs for 2 weeks after polyp                            removal.                           - Await pathology results.                           - Repeat colonoscopy is recommended for                            surveillance. The colonoscopy date will be                            determined after pathology results from today's                            exam become available for review. Remo Lipps P. Sheila Gervasi, MD 05/06/2016 1:56:16 PM This report has been signed electronically.

## 2016-05-06 NOTE — Progress Notes (Signed)
Called to room to assist during endoscopic procedure.  Patient ID and intended procedure confirmed with present staff. Received instructions for my participation in the procedure from the performing physician.  

## 2016-05-06 NOTE — Patient Instructions (Signed)
YOU HAD AN ENDOSCOPIC PROCEDURE TODAY AT [facility]:   Refer to the procedure report that was given to you for any specific questions about what was found during the examination.  If the procedure report does not answer your questions, please call your gastroenterologist to clarify.  If you requested that your care partner not be given the details of your procedure findings, then the procedure report has been included in a sealed envelope for you to review at your convenience later.  YOU SHOULD EXPECT: Some feelings of bloating in the abdomen. Passage of more gas than usual.  Walking can help get rid of the air that was put into your GI tract during the procedure and reduce the bloating. If you had a lower endoscopy (such as a colonoscopy or flexible sigmoidoscopy) you may notice spotting of blood in your stool or on the toilet paper. If you underwent a bowel prep for your procedure, you may not have a normal bowel movement for a few days.  Please Note:  You might notice some irritation and congestion in your nose or some drainage.  This is from the oxygen used during your procedure.  There is no need for concern and it should clear up in a day or so.  SYMPTOMS TO REPORT IMMEDIATELY:   Following lower endoscopy (colonoscopy or flexible sigmoidoscopy):  Excessive amounts of blood in the stool  Significant tenderness or worsening of abdominal pains  Swelling of the abdomen that is new, acute  Fever of 100F or higher   Following upper endoscopy (EGD)  Vomiting of blood or coffee ground material  New chest pain or pain under the shoulder blades  Painful or persistently difficult swallowing  New shortness of breath  Fever of 100F or higher  Black, tarry-looking stools  For urgent or emergent issues, a gastroenterologist can be reached at any hour by calling [phone].   DIET:  We do recommend a small meal at first, but then you may proceed to your regular diet.  Drink  plenty of fluids but you should avoid alcoholic beverages for 24 hours.  ACTIVITY:  You should plan to take it easy for the rest of today and you should NOT DRIVE or use heavy machinery until tomorrow (because of the sedation medicines used during the test).    FOLLOW UP: Our staff will call the number listed on your records the next business day following your procedure to check on you and address any questions or concerns that you may have regarding the information given to you following your procedure. If we do not reach you, we will leave a message.  However, if you are feeling well and you are not experiencing any problems, there is no need to return our call.  We will assume that you have returned to your regular daily activities without incident.  If any biopsies were taken you will be contacted by phone or by letter within the next 1-3 weeks.  Please call us at [phone] if you have not heard about the biopsies in 3 weeks.    SIGNATURES/CONFIDENTIALITY: You and/or your care partner have signed paperwork which will be entered into your electronic medical record.  These signatures attest to the fact that that the information above on your After Visit Summary has been reviewed and is understood.  Full responsibility of the confidentiality of this discharge information lies with you and/or your care-partner.  NO NAPROXEN, IBUPROFEN, OR NSAIDS (ADVIL, ALEVE, MOTRIN) FOR 2 WEEKS.  TYLENOL IS OK  PLEASE READ OVER HANDOUT ABOUT POLYPS  AWAIT PATHOLOGY  CONTINUE MEDICATIONS

## 2016-05-07 ENCOUNTER — Telehealth: Payer: Self-pay | Admitting: *Deleted

## 2016-05-07 NOTE — Telephone Encounter (Signed)
  Follow up Call-  Call back number 05/06/2016  Post procedure Call Back phone  # [phone]  Permission to leave phone message Yes  Some recent data might be hidden     Patient questions:  Do you have a fever, pain , or abdominal swelling? No. Pain Score  0 *  Have you tolerated food without any problems? Yes.    Have you been able to return to your normal activities? Yes.    Do you have any questions about your discharge instructions: Diet   No. Medications  No. Follow up visit  No.  Do you have questions or concerns about your Care? No.  Actions: * If pain score is 4 or above: No action needed, pain <4.

## 2016-05-07 NOTE — Telephone Encounter (Signed)
Opened in error

## 2016-05-12 ENCOUNTER — Other Ambulatory Visit: Payer: Federal, State, Local not specified - PPO

## 2016-05-12 ENCOUNTER — Other Ambulatory Visit: Payer: Self-pay

## 2016-05-12 DIAGNOSIS — K2970 Gastritis, unspecified, without bleeding: Secondary | ICD-10-CM

## 2016-05-12 DIAGNOSIS — E538 Deficiency of other specified B group vitamins: Secondary | ICD-10-CM

## 2016-05-14 LAB — INTRINSIC FACTOR ANTIBODIES: Intrinsic Factor: NEGATIVE

## 2016-05-17 LAB — ANTI-PARIETAL ANTIBODY: PARIETAL CELL ANTIBODY-IGG: POSITIVE — AB

## 2016-05-17 LAB — REFLEX PARIETAL CELL AB TITER: Parietal Cell Ab Titer: >1:640 {titer} — ABNORMAL HIGH

## 2016-05-18 ENCOUNTER — Encounter: Payer: Self-pay | Admitting: Internal Medicine

## 2016-05-18 ENCOUNTER — Ambulatory Visit (INDEPENDENT_AMBULATORY_CARE_PROVIDER_SITE_OTHER): Payer: Federal, State, Local not specified - PPO | Admitting: Internal Medicine

## 2016-05-18 ENCOUNTER — Ambulatory Visit (INDEPENDENT_AMBULATORY_CARE_PROVIDER_SITE_OTHER)
Admission: RE | Admit: 2016-05-18 | Discharge: 2016-05-18 | Disposition: A | Discharge status: Home / Self Care | Payer: Federal, State, Local not specified - PPO | Source: Ambulatory Visit | Attending: Internal Medicine | Admitting: Internal Medicine

## 2016-05-18 VITALS — BP 118/70 | HR 73 | Wt 167.0 lb

## 2016-05-18 DIAGNOSIS — M545 Low back pain, unspecified: Secondary | ICD-10-CM

## 2016-05-18 DIAGNOSIS — G5701 Lesion of sciatic nerve, right lower limb: Secondary | ICD-10-CM

## 2016-05-18 DIAGNOSIS — I10 Essential (primary) hypertension: Secondary | ICD-10-CM | POA: Diagnosis not present

## 2016-05-18 DIAGNOSIS — E538 Deficiency of other specified B group vitamins: Secondary | ICD-10-CM

## 2016-05-18 DIAGNOSIS — E559 Vitamin D deficiency, unspecified: Secondary | ICD-10-CM

## 2016-05-18 MED ORDER — AMLODIPINE BESYLATE 5 MG PO TABS: 5.0000 mg | ORAL_TABLET | Freq: Every day | ORAL | 3 refills | Status: DC

## 2016-05-18 NOTE — Assessment & Plan Note (Signed)
10/17 not better X ray hip and LS spine Options to treat discussed

## 2016-05-18 NOTE — Assessment & Plan Note (Signed)
On B12 

## 2016-05-18 NOTE — Progress Notes (Signed)
Subjective:  Patient ID: Karina Newman, female    DOB: 1956-04-16  Age: 60 y.o. MRN: [id number]  CC: Hip Pain (Right hip with intermittnent swelling from her fall earlier this year)   HPI Karina Newman presents for severe pain in the R hip since her fall in Jan 2017. C/o more pain w/laying down. R foot stays cold and painful... F/u chronic pain, Vit D def, migraines  Outpatient Medications Prior to Visit  Medication Sig Dispense Refill  . amLODipine (NORVASC) 10 MG tablet Take 1 tablet (10 mg total) by mouth daily. 90 tablet 3  . benzonatate (TESSALON) 100 MG capsule Take 1 capsule (100 mg total) by mouth 2 (two) times daily as needed for cough. 20 capsule 0  . Cholecalciferol (VITAMIN D3) 2000 units capsule Take 1 capsule (2,000 Units total) by mouth daily. 100 capsule 3  . Cyanocobalamin (VITAMIN B-12) 500 MCG SUBL 1 sl qd 100 tablet 5  . Diclofenac Sodium (PENNSAID) 1.5 % SOLN Place 0.3 mLs onto the skin 4 (four) times daily as needed. 150 mL 6  . fluticasone (FLONASE) 50 MCG/ACT nasal spray daily.    Marland Kitchen morphine (AVINZA) 60 MG 24 hr capsule Take 1 capsule (60 mg total) by mouth daily. Please fill on or after 05/22/16 30 capsule 0  . omeprazole (PRILOSEC) 40 MG capsule Take 1 capsule by mouth daily.    Marland Kitchen oxyCODONE (ROXICODONE) 15 MG immediate release tablet Take 1 tablet (15 mg total) by mouth 2 (two) times daily as needed for pain. 90 tablet 0  . pseudoephedrine (SUDAFED) 120 MG 12 hr tablet Take 1 tablet (120 mg total) by mouth 2 (two) times daily as needed for congestion. 30 tablet 1  . sucralfate (CARAFATE) 1 g tablet Take 1 gram between meals  3 times daily. 90 tablet 2  . telmisartan-hydrochlorothiazide (MICARDIS HCT) 80-25 MG tablet Take 1 tablet by mouth daily. 90 tablet 3  . tiZANidine (ZANAFLEX) 4 MG tablet Take 1 tablet (4 mg total) by mouth every 8 (eight) hours as needed for muscle spasms. 60 tablet 1  . Vitamin D, Ergocalciferol, (DRISDOL) 50000 units CAPS capsule TAKE 1  CAPSULE (50,000 UNITS TOTAL) BY MOUTH EVERY 7 (SEVEN) DAYS. 8 capsule 0   Facility-Administered Medications Prior to Visit  Medication Dose Route Frequency Provider Last Rate Last Dose  . 0.9 %  sodium chloride infusion  500 mL Intravenous Continuous Manus Gunning, MD        ROS Review of Systems  Constitutional: Negative for activity change, appetite change, chills, fatigue and unexpected weight change.  HENT: Negative for congestion, mouth sores and sinus pressure.   Eyes: Negative for visual disturbance.  Respiratory: Negative for cough and chest tightness.   Gastrointestinal: Negative for abdominal pain and nausea.  Genitourinary: Negative for difficulty urinating, frequency and vaginal pain.  Musculoskeletal: Positive for arthralgias, back pain, gait problem, neck pain and neck stiffness.  Skin: Negative for pallor and rash.  Neurological: Negative for dizziness, tremors, weakness, numbness and headaches.  Psychiatric/Behavioral: Negative for confusion and sleep disturbance.    Objective:  BP 118/70   Pulse 73   Wt 167 lb (75.8 kg)   SpO2 98%   BMI 28.67 kg/m   BP Readings from Last 3 Encounters:  05/18/16 118/70  05/06/16 111/77  03/22/16 (!) 150/80    Wt Readings from Last 3 Encounters:  05/18/16 167 lb (75.8 kg)  05/06/16 165 lb (74.8 kg)  03/22/16 165 lb (74.8 kg)    Physical  Exam  Constitutional: She appears well-developed. No distress.  HENT:  Head: Normocephalic.  Right Ear: External ear normal.  Left Ear: External ear normal.  Nose: Nose normal.  Mouth/Throat: Oropharynx is clear and moist.  Eyes: Conjunctivae are normal. Pupils are equal, round, and reactive to light. Right eye exhibits no discharge. Left eye exhibits no discharge.  Neck: Normal range of motion. Neck supple. No JVD present. No tracheal deviation present. No thyromegaly present.  Cardiovascular: Normal rate, regular rhythm and normal heart sounds.   Pulmonary/Chest: No  stridor. No respiratory distress. She has no wheezes.  Abdominal: Soft. Bowel sounds are normal. She exhibits no distension and no mass. There is no tenderness. There is no rebound and no guarding.  Musculoskeletal: She exhibits tenderness. She exhibits no edema.  Lymphadenopathy:    She has no cervical adenopathy.  Neurological: She displays normal reflexes. No cranial nerve deficit. She exhibits normal muscle tone. Coordination normal.  Skin: No rash noted. No erythema.  Psychiatric: She has a normal mood and affect. Her behavior is normal. Judgment and thought content normal.  R troch major is very tender Neck and shoulders are tender Str leg elev is (-) B  Lab Results  Component Value Date   WBC 5.3 10/16/2015   HGB 13.0 10/16/2015   HCT 39.5 10/16/2015   PLT 338.0 10/16/2015   GLUCOSE 101 (H) 10/16/2015   CHOL 171 10/16/2015   TRIG 119.0 10/16/2015   HDL 42.20 10/16/2015   LDLCALC 105 (H) 10/16/2015   ALT 17 10/16/2015   AST 15 10/16/2015   NA 141 10/16/2015   K 3.8 10/16/2015   CL 104 10/16/2015   CREATININE 0.79 10/16/2015   BUN 12 10/16/2015   CO2 30 10/16/2015   TSH 0.82 10/16/2015    US Abdomen Complete  Result Date: 03/04/2016 CLINICAL DATA:  Right upper quadrant pain for 2 months, worse after eating EXAM: ABDOMEN ULTRASOUND COMPLETE COMPARISON:  None. FINDINGS: Gallbladder: The gallbladder is visualized and no gallstones are noted. There is no pain over the gallbladder with compression. Common bile duct: Diameter: The common bile duct is within upper limits of normal measuring 6.3 mm in diameter. Liver: The liver is echogenic diffusely consistent with fatty infiltration with probable sparing near the gallbladder. IVC: No abnormality visualized. Pancreas: Portions of the head and tail of the pancreas are obscured by bowel gas. Spleen: The spleen measures 5.6 cm. Right Kidney: Length: 10.2 cm.  No hydronephrosis is seen. Left Kidney: Length: 10.7 cm.  No hydronephrosis  is noted. Abdominal aorta: The abdominal aorta is normal in caliber. Other findings: None. IMPRESSION: 1. Echogenic liver parenchyma consistent with diffuse fatty infiltration with probable sparing near the gallbladder. 2. No gallstones. 3. Portions of the head and tail of the pancreas are obscured by bowel gas. Electronically Signed   By: Ivar Drape M.D.   On: 03/04/2016 09:15    Assessment & Plan:   There are no diagnoses linked to this encounter. I am having Ms. Landgren maintain her pseudoephedrine, telmisartan-hydrochlorothiazide, amLODipine, Vitamin D3, Vitamin B-12, benzonatate, fluticasone, omeprazole, Diclofenac Sodium, Vitamin D (Ergocalciferol), tiZANidine, oxyCODONE, morphine, and sucralfate. We will continue to administer sodium chloride.  No orders of the defined types were placed in this encounter.    Follow-up: No Follow-up on file.  Walker Kehr, MD

## 2016-05-18 NOTE — Progress Notes (Signed)
Pre visit review using our clinic review tool, if applicable. No additional management support is needed unless otherwise documented below in the visit note. 

## 2016-05-18 NOTE — Assessment & Plan Note (Signed)
Amlodipine - pt asked to reduce the dose - will  Micardis HCT

## 2016-05-18 NOTE — Assessment & Plan Note (Signed)
Vit D 

## 2016-05-18 NOTE — Assessment & Plan Note (Signed)
X rays

## 2016-05-20 NOTE — Telephone Encounter (Signed)
This was addressed at 05/18/16 OV with PCP.

## 2016-06-16 ENCOUNTER — Ambulatory Visit (INDEPENDENT_AMBULATORY_CARE_PROVIDER_SITE_OTHER): Admitting: Internal Medicine

## 2016-06-16 ENCOUNTER — Encounter: Payer: Self-pay | Admitting: Internal Medicine

## 2016-06-16 VITALS — BP 132/84 | HR 87 | Wt 166.0 lb

## 2016-06-16 DIAGNOSIS — M5010 Cervical disc disorder with radiculopathy, unspecified cervical region: Secondary | ICD-10-CM | POA: Diagnosis not present

## 2016-06-16 DIAGNOSIS — I10 Essential (primary) hypertension: Secondary | ICD-10-CM

## 2016-06-16 DIAGNOSIS — E538 Deficiency of other specified B group vitamins: Secondary | ICD-10-CM

## 2016-06-16 DIAGNOSIS — M25551 Pain in right hip: Secondary | ICD-10-CM

## 2016-06-16 DIAGNOSIS — K219 Gastro-esophageal reflux disease without esophagitis: Secondary | ICD-10-CM | POA: Diagnosis not present

## 2016-06-16 MED ORDER — OXYCODONE HCL 15 MG PO TABS: 15.0000 mg | ORAL_TABLET | Freq: Two times a day (BID) | ORAL | 0 refills | Status: DC | PRN

## 2016-06-16 MED ORDER — OXYCODONE HCL 15 MG PO TABS: 15.0000 mg | ORAL_TABLET | Freq: Three times a day (TID) | ORAL | 0 refills | Status: DC | PRN

## 2016-06-16 MED ORDER — MORPHINE SULFATE ER BEADS 60 MG PO CP24: 60.0000 mg | ORAL_CAPSULE | Freq: Every day | ORAL | 0 refills | Status: DC

## 2016-06-16 MED ORDER — DICLOFENAC SODIUM 1.5 % TD SOLN: 5.0000 [drp] | Freq: Four times a day (QID) | TRANSDERMAL | 6 refills | Status: DC | PRN

## 2016-06-16 NOTE — Assessment & Plan Note (Signed)
Avinza/Oxycod renewed Rx  Potential benefits of a long term opioids use as well as potential risks (i.e. addiction risk, apnea etc) and complications (i.e. Somnolence, constipation and others) were explained to the patient and were aknowledged.

## 2016-06-16 NOTE — Progress Notes (Signed)
Pre visit review using our clinic review tool, if applicable. No additional management support is needed unless otherwise documented below in the visit note. 

## 2016-06-16 NOTE — Progress Notes (Signed)
Subjective:  Patient ID: Karina Newman, female    DOB: 12-25-1955  Age: 60 y.o. MRN: [id number]  CC: No chief complaint on file.   HPI Karina Newman presents for neck and shoulder pain, B12 def, HTN f/u  Outpatient Medications Prior to Visit  Medication Sig Dispense Refill  . amLODipine (NORVASC) 5 MG tablet Take 1 tablet (5 mg total) by mouth daily. 90 tablet 3  . Cholecalciferol (VITAMIN D3) 2000 units capsule Take 1 capsule (2,000 Units total) by mouth daily. 100 capsule 3  . Cyanocobalamin (VITAMIN B-12) 500 MCG SUBL 1 sl qd 100 tablet 5  . Diclofenac Sodium (PENNSAID) 1.5 % SOLN Place 0.3 mLs onto the skin 4 (four) times daily as needed. 150 mL 6  . fluticasone (FLONASE) 50 MCG/ACT nasal spray daily.    Marland Kitchen morphine (AVINZA) 60 MG 24 hr capsule Take 1 capsule (60 mg total) by mouth daily. Please fill on or after 05/22/16 30 capsule 0  . omeprazole (PRILOSEC) 40 MG capsule Take 1 capsule by mouth daily.    Marland Kitchen oxyCODONE (ROXICODONE) 15 MG immediate release tablet Take 1 tablet (15 mg total) by mouth 2 (two) times daily as needed for pain. 90 tablet 0  . pseudoephedrine (SUDAFED) 120 MG 12 hr tablet Take 1 tablet (120 mg total) by mouth 2 (two) times daily as needed for congestion. 30 tablet 1  . sucralfate (CARAFATE) 1 g tablet Take 1 gram between meals  3 times daily. 90 tablet 2  . telmisartan-hydrochlorothiazide (MICARDIS HCT) 80-25 MG tablet Take 1 tablet by mouth daily. 90 tablet 3  . tiZANidine (ZANAFLEX) 4 MG tablet Take 1 tablet (4 mg total) by mouth every 8 (eight) hours as needed for muscle spasms. 60 tablet 1  . Vitamin D, Ergocalciferol, (DRISDOL) 50000 units CAPS capsule TAKE 1 CAPSULE (50,000 UNITS TOTAL) BY MOUTH EVERY 7 (SEVEN) DAYS. (Patient not taking: Reported on 06/16/2016) 8 capsule 0   Facility-Administered Medications Prior to Visit  Medication Dose Route Frequency Provider Last Rate Last Dose  . 0.9 %  sodium chloride infusion  500 mL Intravenous Continuous Manus Gunning, MD        ROS Review of Systems  Constitutional: Negative for activity change, appetite change, chills, fatigue and unexpected weight change.  HENT: Negative for congestion, mouth sores and sinus pressure.   Eyes: Negative for visual disturbance.  Respiratory: Negative for cough and chest tightness.   Cardiovascular: Negative for leg swelling.  Gastrointestinal: Negative for abdominal pain and nausea.  Genitourinary: Negative for difficulty urinating, frequency and vaginal pain.  Musculoskeletal: Positive for arthralgias, back pain and neck pain. Negative for gait problem.  Skin: Negative for pallor and rash.  Neurological: Negative for dizziness, tremors, weakness, numbness and headaches.  Psychiatric/Behavioral: Negative for confusion and sleep disturbance.    Objective:  BP 132/84   Pulse 87   Wt 166 lb (75.3 kg)   SpO2 97%   BMI 28.49 kg/m   BP Readings from Last 3 Encounters:  06/16/16 132/84  05/18/16 118/70  05/06/16 111/77    Wt Readings from Last 3 Encounters:  06/16/16 166 lb (75.3 kg)  05/18/16 167 lb (75.8 kg)  05/06/16 165 lb (74.8 kg)    Physical Exam  Constitutional: She appears well-developed. No distress.  HENT:  Head: Normocephalic.  Right Ear: External ear normal.  Left Ear: External ear normal.  Nose: Nose normal.  Mouth/Throat: Oropharynx is clear and moist.  Eyes: Conjunctivae are normal. Pupils are equal, round,  and reactive to light. Right eye exhibits no discharge. Left eye exhibits no discharge.  Neck: Normal range of motion. Neck supple. No JVD present. No tracheal deviation present. No thyromegaly present.  Cardiovascular: Normal rate, regular rhythm and normal heart sounds.   Pulmonary/Chest: No stridor. No respiratory distress. She has no wheezes.  Abdominal: Soft. Bowel sounds are normal. She exhibits no distension and no mass. There is no tenderness. There is no rebound and no guarding.  Musculoskeletal: She exhibits  tenderness. She exhibits no edema.  Lymphadenopathy:    She has no cervical adenopathy.  Neurological: She displays normal reflexes. No cranial nerve deficit. She exhibits normal muscle tone. Coordination normal.  Skin: No rash noted. No erythema.  Psychiatric: She has a normal mood and affect. Her behavior is normal. Judgment and thought content normal.    Lab Results  Component Value Date   WBC 5.3 10/16/2015   HGB 13.0 10/16/2015   HCT 39.5 10/16/2015   PLT 338.0 10/16/2015   GLUCOSE 101 (H) 10/16/2015   CHOL 171 10/16/2015   TRIG 119.0 10/16/2015   HDL 42.20 10/16/2015   LDLCALC 105 (H) 10/16/2015   ALT 17 10/16/2015   AST 15 10/16/2015   NA 141 10/16/2015   K 3.8 10/16/2015   CL 104 10/16/2015   CREATININE 0.79 10/16/2015   BUN 12 10/16/2015   CO2 30 10/16/2015   TSH 0.82 10/16/2015    Dg Lumbar Spine 2-3 Views  Result Date: 05/18/2016 CLINICAL DATA:  Fall January 2017.  Right sciatica.  Low back pain. EXAM: LUMBAR SPINE - 2-3 VIEW COMPARISON:  None. FINDINGS: Degenerative disc disease changes at L5-S1 with disc space narrowing. Normal alignment. No fracture. SI joints are symmetric and unremarkable. IMPRESSION: No acute bony abnormality. Electronically Signed   By: Rolm Baptise M.D.   On: 05/18/2016 15:43    Assessment & Plan:   There are no diagnoses linked to this encounter. I have discontinued Ms. Whitehouse's Vitamin D (Ergocalciferol). I am also having her maintain her pseudoephedrine, telmisartan-hydrochlorothiazide, Vitamin D3, Vitamin B-12, fluticasone, omeprazole, Diclofenac Sodium, tiZANidine, oxyCODONE, morphine, sucralfate, and amLODipine. We will continue to administer sodium chloride.  No orders of the defined types were placed in this encounter.    Follow-up: No Follow-up on file.  Walker Kehr, MD

## 2016-06-16 NOTE — Assessment & Plan Note (Signed)
Dr Havery Moros Protonix po

## 2016-06-16 NOTE — Assessment & Plan Note (Signed)
Amlodipine Micardis HCT 

## 2016-06-16 NOTE — Assessment & Plan Note (Signed)
On B12 

## 2016-06-18 ENCOUNTER — Ambulatory Visit (INDEPENDENT_AMBULATORY_CARE_PROVIDER_SITE_OTHER)
Admission: RE | Admit: 2016-06-18 | Discharge: 2016-06-18 | Disposition: A | Discharge status: Home / Self Care | Payer: Federal, State, Local not specified - PPO | Source: Ambulatory Visit | Attending: Internal Medicine | Admitting: Internal Medicine

## 2016-06-18 DIAGNOSIS — M25551 Pain in right hip: Secondary | ICD-10-CM

## 2016-06-22 ENCOUNTER — Other Ambulatory Visit: Payer: Self-pay | Admitting: *Deleted

## 2016-06-22 MED ORDER — AMLODIPINE BESYLATE 5 MG PO TABS: 5.0000 mg | ORAL_TABLET | Freq: Every day | ORAL | 3 refills | Status: DC

## 2016-06-22 NOTE — Telephone Encounter (Signed)
Pt states Amlodipine Rf was never received. New Rx sent.

## 2016-07-28 ENCOUNTER — Ambulatory Visit (INDEPENDENT_AMBULATORY_CARE_PROVIDER_SITE_OTHER): Admitting: Internal Medicine

## 2016-07-28 ENCOUNTER — Encounter: Payer: Self-pay | Admitting: Internal Medicine

## 2016-07-28 DIAGNOSIS — M25519 Pain in unspecified shoulder: Secondary | ICD-10-CM

## 2016-07-28 DIAGNOSIS — M5010 Cervical disc disorder with radiculopathy, unspecified cervical region: Secondary | ICD-10-CM

## 2016-07-28 NOTE — Assessment & Plan Note (Signed)
R shoulder pain, R brachial neuritis and R shoulder impingement Letter dictated

## 2016-07-28 NOTE — Progress Notes (Signed)
Subjective:  Patient ID: Karina Newman, female    DOB: May 03, 1956  Age: 60 y.o. MRN: [id number]  CC: No chief complaint on file.   HPI Karina Newman presents for R shoulder pain, R brachial neuritis and R shoulder impingement f/u  Outpatient Medications Prior to Visit  Medication Sig Dispense Refill  . amLODipine (NORVASC) 5 MG tablet Take 1 tablet (5 mg total) by mouth daily. 90 tablet 3  . Cholecalciferol (VITAMIN D3) 2000 units capsule Take 1 capsule (2,000 Units total) by mouth daily. 100 capsule 3  . Cyanocobalamin (VITAMIN B-12) 500 MCG SUBL 1 sl qd 100 tablet 5  . Diclofenac Sodium (PENNSAID) 1.5 % SOLN Place 0.3 mLs onto the skin 4 (four) times daily as needed. 150 mL 6  . fluticasone (FLONASE) 50 MCG/ACT nasal spray daily.    Marland Kitchen morphine (AVINZA) 60 MG 24 hr capsule Take 1 capsule (60 mg total) by mouth daily. 30 capsule 0  . omeprazole (PRILOSEC) 40 MG capsule Take 1 capsule by mouth daily.    Marland Kitchen oxyCODONE (ROXICODONE) 15 MG immediate release tablet Take 1 tablet (15 mg total) by mouth every 8 (eight) hours as needed for pain. 90 tablet 0  . pseudoephedrine (SUDAFED) 120 MG 12 hr tablet Take 1 tablet (120 mg total) by mouth 2 (two) times daily as needed for congestion. 30 tablet 1  . sucralfate (CARAFATE) 1 g tablet Take 1 gram between meals  3 times daily. 90 tablet 2  . telmisartan-hydrochlorothiazide (MICARDIS HCT) 80-25 MG tablet Take 1 tablet by mouth daily. 90 tablet 3  . tiZANidine (ZANAFLEX) 4 MG tablet Take 1 tablet (4 mg total) by mouth every 8 (eight) hours as needed for muscle spasms. 60 tablet 1   Facility-Administered Medications Prior to Visit  Medication Dose Route Frequency Provider Last Rate Last Dose  . 0.9 %  sodium chloride infusion  500 mL Intravenous Continuous Manus Gunning, MD        ROS Review of Systems  Constitutional: Negative for activity change, appetite change, chills, fatigue and unexpected weight change.  HENT: Negative for  congestion, mouth sores and sinus pressure.   Eyes: Negative for visual disturbance.  Respiratory: Negative for cough and chest tightness.   Gastrointestinal: Negative for abdominal pain and nausea.  Genitourinary: Negative for difficulty urinating, frequency and vaginal pain.  Musculoskeletal: Positive for arthralgias, neck pain and neck stiffness. Negative for back pain and gait problem.  Skin: Negative for pallor and rash.  Neurological: Negative for dizziness, tremors, weakness, numbness and headaches.  Psychiatric/Behavioral: Negative for confusion, sleep disturbance and suicidal ideas.    Objective:  BP (!) 160/90   Pulse 86   Wt 167 lb (75.8 kg)   SpO2 97%   BMI 28.67 kg/m   BP Readings from Last 3 Encounters:  07/28/16 (!) 160/90  06/16/16 132/84  05/18/16 118/70    Wt Readings from Last 3 Encounters:  07/28/16 167 lb (75.8 kg)  06/16/16 166 lb (75.3 kg)  05/18/16 167 lb (75.8 kg)    Physical Exam  Constitutional: She appears well-developed. No distress.  HENT:  Head: Normocephalic.  Right Ear: External ear normal.  Left Ear: External ear normal.  Nose: Nose normal.  Mouth/Throat: Oropharynx is clear and moist.  Eyes: Conjunctivae are normal. Pupils are equal, round, and reactive to light. Right eye exhibits no discharge. Left eye exhibits no discharge.  Neck: Normal range of motion. Neck supple. No JVD present. No tracheal deviation present. No thyromegaly present.  Cardiovascular: Normal rate, regular rhythm and normal heart sounds.   Pulmonary/Chest: No stridor. No respiratory distress. She has no wheezes.  Abdominal: Soft. Bowel sounds are normal. She exhibits no distension and no mass. There is no tenderness. There is no rebound and no guarding.  Musculoskeletal: She exhibits tenderness. She exhibits no edema.  Lymphadenopathy:    She has no cervical adenopathy.  Neurological: She displays normal reflexes. No cranial nerve deficit. She exhibits normal  muscle tone. Coordination normal.  Skin: No rash noted. No erythema.  Psychiatric: She has a normal mood and affect. Her behavior is normal. Judgment and thought content normal.  pain R>L shoulders, neck  Lab Results  Component Value Date   WBC 5.3 10/16/2015   HGB 13.0 10/16/2015   HCT 39.5 10/16/2015   PLT 338.0 10/16/2015   GLUCOSE 101 (H) 10/16/2015   CHOL 171 10/16/2015   TRIG 119.0 10/16/2015   HDL 42.20 10/16/2015   LDLCALC 105 (H) 10/16/2015   ALT 17 10/16/2015   AST 15 10/16/2015   NA 141 10/16/2015   K 3.8 10/16/2015   CL 104 10/16/2015   CREATININE 0.79 10/16/2015   BUN 12 10/16/2015   CO2 30 10/16/2015   TSH 0.82 10/16/2015    Dg Hip Unilat With Pelvis 2-3 Views Right  Result Date: 06/18/2016 CLINICAL DATA:  Generalized right hip pain after fall 10 days ago. EXAM: DG HIP (WITH OR WITHOUT PELVIS) 2-3V RIGHT COMPARISON:  None FINDINGS: Degenerative changes in the hips bilaterally with joint space narrowing and spurring. SI joints are symmetric and unremarkable. No acute bony abnormality. Specifically, no fracture, subluxation, or dislocation. Soft tissues are intact. IMPRESSION: Mild degenerative changes in the hips bilaterally. No acute bony abnormality. Electronically Signed   By: Rolm Baptise M.D.   On: 06/18/2016 10:55    Assessment & Plan:   There are no diagnoses linked to this encounter. I am having Ms. Pylant maintain her pseudoephedrine, telmisartan-hydrochlorothiazide, Vitamin D3, Vitamin B-12, fluticasone, omeprazole, tiZANidine, sucralfate, Diclofenac Sodium, oxyCODONE, morphine, and amLODipine. We will continue to administer sodium chloride.  No orders of the defined types were placed in this encounter.    Follow-up: No Follow-up on file.  Walker Kehr, MD

## 2016-07-28 NOTE — Progress Notes (Signed)
Pre visit review using our clinic review tool, if applicable. No additional management support is needed unless otherwise documented below in the visit note. 

## 2016-07-28 NOTE — Assessment & Plan Note (Signed)
On Oxycodone  Potential benefits of a long term opioids use as well as potential risks (i.e. addiction risk, apnea etc) and complications (i.e. Somnolence, constipation and others) were explained to the patient and were aknowledged.  

## 2016-08-02 ENCOUNTER — Telehealth: Payer: Self-pay | Admitting: *Deleted

## 2016-08-02 NOTE — Telephone Encounter (Signed)
Letter mailed or faxed. Pls give the pt a copy Thx

## 2016-08-02 NOTE — Telephone Encounter (Signed)
Pt left a vm checking the status of the information needed for the dept of labor. Is this the letter that you had me fax last week or do you have additional forms to be completed?

## 2016-08-03 ENCOUNTER — Telehealth: Payer: Self-pay | Admitting: Emergency Medicine

## 2016-08-03 NOTE — Telephone Encounter (Signed)
See phone note dated 08/03/16.

## 2016-08-03 NOTE — Telephone Encounter (Signed)
Pt called and asked that you give her a call. She has some questions about the letter that was sent to the Department of Labor. Please advise thanks.

## 2016-08-05 NOTE — Telephone Encounter (Signed)
I called pt- she is requesting copy of 07/28/16 letter PCP mailed to Dept of labor. Copy mailed to pt.

## 2016-08-20 ENCOUNTER — Other Ambulatory Visit: Payer: Self-pay | Admitting: Physician Assistant

## 2016-08-27 ENCOUNTER — Encounter: Payer: Self-pay | Admitting: Gastroenterology

## 2016-08-27 ENCOUNTER — Encounter (INDEPENDENT_AMBULATORY_CARE_PROVIDER_SITE_OTHER): Payer: Self-pay

## 2016-08-27 ENCOUNTER — Ambulatory Visit (INDEPENDENT_AMBULATORY_CARE_PROVIDER_SITE_OTHER): Payer: Federal, State, Local not specified - PPO | Admitting: Gastroenterology

## 2016-08-27 ENCOUNTER — Other Ambulatory Visit (INDEPENDENT_AMBULATORY_CARE_PROVIDER_SITE_OTHER): Payer: Federal, State, Local not specified - PPO

## 2016-08-27 VITALS — BP 148/88 | HR 88 | Ht 64.0 in | Wt 165.2 lb

## 2016-08-27 DIAGNOSIS — K219 Gastro-esophageal reflux disease without esophagitis: Secondary | ICD-10-CM

## 2016-08-27 DIAGNOSIS — K2940 Chronic atrophic gastritis without bleeding: Secondary | ICD-10-CM

## 2016-08-27 DIAGNOSIS — R1031 Right lower quadrant pain: Secondary | ICD-10-CM

## 2016-08-27 LAB — COMPREHENSIVE METABOLIC PANEL
ALT: 21 U/L (ref 0–35)
AST: 17 U/L (ref 0–37)
Albumin: 4.4 g/dL (ref 3.5–5.2)
Alkaline Phosphatase: 88 U/L (ref 39–117)
BUN: 10 mg/dL (ref 6–23)
CHLORIDE: 100 meq/L (ref 96–112)
CO2: 31 meq/L (ref 19–32)
Calcium: 9.6 mg/dL (ref 8.4–10.5)
Creatinine, Ser: 0.78 mg/dL (ref 0.40–1.20)
GFR: 97.04 mL/min (ref >60.00)
GLUCOSE: 106 mg/dL — AB (ref 70–99)
POTASSIUM: 3.5 meq/L (ref 3.5–5.1)
SODIUM: 139 meq/L (ref 135–145)
Total Bilirubin: 0.4 mg/dL (ref 0.2–1.2)
Total Protein: 8.1 g/dL (ref 6.0–8.3)

## 2016-08-27 LAB — CBC WITH DIFFERENTIAL/PLATELET
Basophils Absolute: 0 10*3/uL (ref 0.0–0.1)
Basophils Relative: 0.3 % (ref 0.0–3.0)
EOS PCT: 0.6 % (ref 0.0–5.0)
Eosinophils Absolute: 0 10*3/uL (ref 0.0–0.7)
HCT: 41.2 % (ref 36.0–46.0)
Hemoglobin: 14.1 g/dL (ref 12.0–15.0)
LYMPHS ABS: 1.3 10*3/uL (ref 0.7–4.0)
Lymphocytes Relative: 16.8 % (ref 12.0–46.0)
MCHC: 34.2 g/dL (ref 30.0–36.0)
MCV: 91.7 fl (ref 78.0–100.0)
MONO ABS: 0.4 10*3/uL (ref 0.1–1.0)
MONOS PCT: 5 % (ref 3.0–12.0)
NEUTROS ABS: 6 10*3/uL (ref 1.4–7.7)
NEUTROS PCT: 77.3 % — AB (ref 43.0–77.0)
PLATELETS: 406 10*3/uL — AB (ref 150.0–400.0)
RBC: 4.49 Mil/uL (ref 3.87–5.11)
RDW: 12.3 % (ref 11.5–15.5)
WBC: 7.8 10*3/uL (ref 4.0–10.5)

## 2016-08-27 LAB — VITAMIN B12: Vitamin B-12: 971 pg/mL — ABNORMAL HIGH (ref 211–911)

## 2016-08-27 LAB — TSH: TSH: 0.28 u[IU]/mL — ABNORMAL LOW (ref 0.35–4.50)

## 2016-08-27 MED ORDER — RANITIDINE HCL 150 MG PO TABS: 150.0000 mg | ORAL_TABLET | Freq: Two times a day (BID) | ORAL | 3 refills | Status: DC

## 2016-08-27 NOTE — Progress Notes (Signed)
HPI :  61 year old female here for follow-up visit. She was previously seen here for abdominal pain and colon cancer screening. Since her last visit she has had EGD and colonoscopy as outlined below  EGD 05/06/16 - normal EGD, biopsies taken for H pylori - showing "marked chronic gastritis" , negative for HP Colonoscopy 05/06/16 - normal colon and ileum, small adenoma, hemorrhoids. Recall colonoscopy in 5 years.  Labs show negative IF AB, positive parietal cell AB, and B12 deficiency She denies any FH of gastric cancer.   She reports some ongoing pain in her right side in her RLQ and R flank. She thinks this has been present for 6-7 months or so. She reports it is mildly all the time, but sometimes it can get severe and significantly bothers her. Pain is not related eating, not related to bowel movements. She denies any constipation. No weight loss. She denies any positional changes to make it worse. She uses heating pad which can help. She is chronic narcotics for chronic shoulder / neck pain which can help her abdominal pain.   She does have a fatty liver on [place]. No gallstones. Only rare alcohol use. She denies any weight gain.   She is taking omeprazole 40mg  one daily for reflux, overall does a decent job controlling symptoms.    Past Medical History:  Diagnosis Date  . Abnormal cervical Pap smear with positive HPV DNA test 01/2014   Normal cytology with positive high-risk HPV 18/45. Colposcopy negative with negative ECC  . Anemia   . Autoimmune gastritis   . Fatty liver   . Gastritis   . Heart murmur   . Hypertension   . Hyperthyroidism   . Internal hemorrhoids   . Right shoulder pain 2001   chronic pain  . Thyroid disease    Hyperthyroid. Was on PTU (Dr Hampton Abbot in [place]) - stopped in 2013, stable  . Tubular adenoma of colon      Past Surgical History:  Procedure Laterality Date  . CESAREAN SECTION     x 4   . HAND SURGERY Left   . SHOULDER SURGERY Right 2002  . SHOULDER  SURGERY Left 2014  . TUBAL LIGATION     Family History  Problem Relation Age of Onset  . Kidney disease Mother     ESRD  . Hypertension Mother   . Cancer Father     lung ca  . Hypertension Sister   . Diabetes Brother   . Diabetes Paternal Grandmother   . Colon cancer Neg Hx    Social History  Substance Use Topics  . Smoking status: Never Smoker  . Smokeless tobacco: Never Used  . Alcohol use Yes     Comment: social   Current Outpatient Prescriptions  Medication Sig Dispense Refill  . amLODipine (NORVASC) 5 MG tablet Take 1 tablet (5 mg total) by mouth daily. 90 tablet 3  . Cholecalciferol (VITAMIN D3) 2000 units capsule Take 1 capsule (2,000 Units total) by mouth daily. 100 capsule 3  . Cyanocobalamin (VITAMIN B-12) 500 MCG SUBL 1 sl qd 100 tablet 5  . Diclofenac Sodium (PENNSAID) 1.5 % SOLN Place 0.3 mLs onto the skin 4 (four) times daily as needed. 150 mL 6  . fluticasone (FLONASE) 50 MCG/ACT nasal spray Place 1 spray into both nostrils as needed.     Marland Kitchen morphine (AVINZA) 60 MG 24 hr capsule Take 1 capsule (60 mg total) by mouth daily. 30 capsule 0  . omeprazole (PRILOSEC) 40 MG  capsule Take 1 capsule by mouth as needed.     Marland Kitchen oxyCODONE (ROXICODONE) 15 MG immediate release tablet Take 1 tablet (15 mg total) by mouth every 8 (eight) hours as needed for pain. 90 tablet 0  . pseudoephedrine (SUDAFED) 120 MG 12 hr tablet Take 1 tablet (120 mg total) by mouth 2 (two) times daily as needed for congestion. 30 tablet 1  . sucralfate (CARAFATE) 1 g tablet TAKE 1 TABLET BY MOUTH 3 TIMES A DAY BETWEEN MEALS 90 tablet 2  . telmisartan-hydrochlorothiazide (MICARDIS HCT) 80-25 MG tablet Take 1 tablet by mouth daily. 90 tablet 3  . tiZANidine (ZANAFLEX) 4 MG tablet Take 1 tablet (4 mg total) by mouth every 8 (eight) hours as needed for muscle spasms. 60 tablet 1  . ranitidine (ZANTAC) 150 MG tablet Take 1 tablet (150 mg total) by mouth 2 (two) times daily. 60 tablet 3   Current  Facility-Administered Medications  Medication Dose Route Frequency Provider Last Rate Last Dose  . 0.9 %  sodium chloride infusion  500 mL Intravenous Continuous Manus Gunning, MD       Allergies  Allergen Reactions  . Penicillins Hives  . Gabapentin     Nausea   . Lyrica [Pregabalin] Dermatitis  . Nsaids     gastritis  . Voltaren [Diclofenac] Hives     Review of Systems: All systems reviewed and negative except where noted in HPI.   Lab Results  Component Value Date   WBC 5.3 10/16/2015   HGB 13.0 10/16/2015   HCT 39.5 10/16/2015   MCV 93.5 10/16/2015   PLT 338.0 10/16/2015    Lab Results  Component Value Date   ALT 17 10/16/2015   AST 15 10/16/2015   ALKPHOS 75 10/16/2015   BILITOT 0.3 10/16/2015    Lab Results  Component Value Date   CREATININE 0.79 10/16/2015   BUN 12 10/16/2015   NA 141 10/16/2015   K 3.8 10/16/2015   CL 104 10/16/2015   CO2 30 10/16/2015     Physical Exam: BP (!) 148/88 (BP Location: Left Arm, Patient Position: Sitting, Cuff Size: Normal)   Pulse 88   Ht 5\' 4"  (1.626 m)   Wt 165 lb 4 oz (75 kg)   BMI 28.37 kg/m  Constitutional: Pleasant,well-developed, female in no acute distress. HEENT: Normocephalic and atraumatic. Conjunctivae are normal. No scleral icterus. Neck supple.  Cardiovascular: Normal rate, regular rhythm.  Pulmonary/chest: Effort normal and breath sounds normal. No wheezing, rales or rhonchi. Abdominal: Soft, nondistended, nontender. Negative [place].   There are no masses palpable. No hepatomegaly. Extremities: no edema Lymphadenopathy: No cervical adenopathy noted. Neurological: Alert and oriented to person place and time. Skin: Skin is warm and dry. No rashes noted. Psychiatric: Normal mood and affect. Behavior is normal.   ASSESSMENT AND PLAN: 61 year old female here for assessment of the following issues:  Right lower quadrant abdominal pain - colonoscopy without any clear etiology. She has no  clear triggers for this, and otherwise no alarm symptoms, however bothers her significantly and severely at times. Is possible this could be musculoskeletal however I could not elicit it on exam today. We'll obtain baseline labs today and proceed with CT scan in the abdomen and pelvis with contrast to further evaluate. She agreed.   Autoimmune gastritis / Pernicious anemia - chronic gastritis noted on her last 2 endoscopies. She has history of B12 deficiency. She has a positive antiparietal cell antibody. I discussed with this is with her. She has  had 2 prior endoscopies without any concerning lesions in the stomach. Guidelines do recommend EGD at the time of diagnosis, however there are no guideline recommendations for long-term surveillance for this. Moving forward if she develops upper abdominal pain or other upper tract symptoms we may consider repeat EGD. We will recheck her B12 today to ensure is repleted and if not she may benefit from injectable therapy. She asked me to check her TSH given her history of hyperthyroidism to ensure normal.  GERD - she has a long-term risks of omeprazole which I discussed with her at length. Her symptoms are very well controlled with omeprazole and she wishes to try to wean to using Zantac for long-term use. She will decrease omeprazole to 40 mg every other day for 2 weeks and then stop. We will start Zantac 150 mg twice daily as she weans off omeprazole. She can follow-up as needed for this issue.  [place] Cellar, MD [facility] Pager [phone]

## 2016-08-27 NOTE — Patient Instructions (Signed)
If you are age 60 or older, your body mass index should be between 23-30. Your Body mass index is 28.37 kg/m. If this is out of the aforementioned range listed, please consider follow up with your Primary Care Provider.  If you are age 83 or younger, your body mass index should be between 19-25. Your Body mass index is 28.37 kg/m. If this is out of the aformentioned range listed, please consider follow up with your Primary Care Provider.   We have sent the following medications to your pharmacy for you to pick up at your convenience:  Zantac  Please wean off of Omeprazole as instructed by Dr. Havery Moros.  Your physician has requested that you go to the basement for lab work before leaving today.  You have been scheduled for a CT scan of the abdomen and pelvis at [facility] ([address]---this is in the same building as Press [facility]).   You are scheduled on Tuesday January 16th at 12:30pm. You should arrive 15 minutes prior to your appointment time for registration. Please follow the written instructions below on the day of your exam:  WARNING: IF YOU ARE ALLERGIC TO IODINE/X-RAY DYE, PLEASE NOTIFY RADIOLOGY IMMEDIATELY AT [phone]! YOU WILL BE GIVEN A 13 HOUR PREMEDICATION PREP.  1) Do not eat or drink anything after 8:30am(4 hours prior to your test). You may drink water. 2) You have been given 2 bottles of oral contrast to drink. The solution may taste               better if refrigerated, but do NOT add ice or any other liquid to this solution. Shake             well before drinking.    Drink 1 bottle of contrast @ 10:30am(2 hours prior to your exam)  Drink 1 bottle of contrast @ 11:30am (1 hour prior to your exam)  You may take any medications as prescribed with a small amount of water except for the following: Metformin, Glucophage, Glucovance, Avandamet, Riomet, Fortamet, Actoplus Met, Janumet, Glumetza or Metaglip. The above medications must be held the  day of the exam AND 48 hours after the exam.  The purpose of you drinking the oral contrast is to aid in the visualization of your intestinal tract. The contrast solution may cause some diarrhea. Before your exam is started, you will be given a small amount of fluid to drink. Depending on your individual set of symptoms, you may also receive an intravenous injection of x-ray contrast/dye. Plan on being at [facility] for 30 minutes or longer, depending on the type of exam you are having performed.  This test typically takes 30-45 minutes to complete.  If you have any questions regarding your exam or if you need to reschedule, you may call the CT department at [phone] between the hours of 8:00 am and 5:00 pm, Monday-Friday.  ________________________________________________________________________

## 2016-08-30 ENCOUNTER — Telehealth: Payer: Self-pay

## 2016-08-30 NOTE — Telephone Encounter (Signed)
Dr Havery Moros changed CT scan to CT Abd/Pelvis with contrast. Original order scheduled with [facility]. Original CT cancelled with [facility]  and rescheduled with [place] CT for 09/03/16 at 11:30.   Pt to arrive at 11:15 and first bottle of contrast is to start at 9:30 and second bottle at 10:30. Pt informed. Address and number on AVS that was printed last Friday.  She states that she is allergic to Iodine. It instructed pt to call CT to ask if this would be a problem with the contrast. She understood.

## 2016-08-31 ENCOUNTER — Ambulatory Visit (HOSPITAL_COMMUNITY): Payer: Federal, State, Local not specified - PPO

## 2016-09-01 ENCOUNTER — Ambulatory Visit (INDEPENDENT_AMBULATORY_CARE_PROVIDER_SITE_OTHER): Payer: Federal, State, Local not specified - PPO | Admitting: Orthopedic Surgery

## 2016-09-03 ENCOUNTER — Other Ambulatory Visit: Payer: Self-pay | Admitting: Internal Medicine

## 2016-09-03 ENCOUNTER — Inpatient Hospital Stay: Admission: RE | Admit: 2016-09-03 | Payer: Self-pay | Source: Ambulatory Visit

## 2016-09-03 ENCOUNTER — Telehealth: Payer: Self-pay | Admitting: Gastroenterology

## 2016-09-03 NOTE — Telephone Encounter (Signed)
Spoke to patient, it is not due to her kidney function, but she says her endocrinologist in [place] said because of her hyperthyroidism she cannot have anything with iodine in. She still can have the oral contrast.

## 2016-09-03 NOTE — Telephone Encounter (Signed)
Called Stacey back in radiology, she changed order to state oral contrast only, no IV.

## 2016-09-03 NOTE — Telephone Encounter (Signed)
Unless she has a specific iodine or contrast allergy we are not aware of, her kidney function is normal, I don't see any reason why she could not have CT with contrast and would proceed if no specific allergies listed. Thanks

## 2016-09-03 NOTE — Telephone Encounter (Signed)
Got it, okay then we can proceed with oral contrast and no IV. Thanks

## 2016-09-03 NOTE — Telephone Encounter (Signed)
Karina Newman in radiology called to let us know that patient rescheduled her CT to 1/25 and to also let us know that patient states "her doctor told her that she could never have iodine contrast because of her kidneys". Karina Newman checked with the technician and their is a "trace" amount of iodine in the IV contrast. Patient does have a current BUN/Creatinine level. Do you still want to have her proceed?

## 2016-09-09 ENCOUNTER — Ambulatory Visit (INDEPENDENT_AMBULATORY_CARE_PROVIDER_SITE_OTHER)
Admission: RE | Admit: 2016-09-09 | Discharge: 2016-09-09 | Disposition: A | Discharge status: Home / Self Care | Payer: Federal, State, Local not specified - PPO | Source: Ambulatory Visit | Attending: Gastroenterology | Admitting: Gastroenterology

## 2016-09-09 DIAGNOSIS — R1031 Right lower quadrant pain: Secondary | ICD-10-CM

## 2016-09-16 ENCOUNTER — Encounter (INDEPENDENT_AMBULATORY_CARE_PROVIDER_SITE_OTHER): Payer: Self-pay | Admitting: Orthopedic Surgery

## 2016-09-16 ENCOUNTER — Ambulatory Visit (INDEPENDENT_AMBULATORY_CARE_PROVIDER_SITE_OTHER): Payer: Self-pay

## 2016-09-16 ENCOUNTER — Ambulatory Visit (INDEPENDENT_AMBULATORY_CARE_PROVIDER_SITE_OTHER): Payer: Federal, State, Local not specified - PPO | Admitting: Orthopedic Surgery

## 2016-09-16 DIAGNOSIS — M25552 Pain in left hip: Secondary | ICD-10-CM

## 2016-09-16 DIAGNOSIS — M25512 Pain in left shoulder: Secondary | ICD-10-CM | POA: Diagnosis not present

## 2016-09-16 DIAGNOSIS — M25511 Pain in right shoulder: Secondary | ICD-10-CM | POA: Insufficient documentation

## 2016-09-16 DIAGNOSIS — M25551 Pain in right hip: Secondary | ICD-10-CM

## 2016-09-16 NOTE — Progress Notes (Signed)
Office Visit Note   Patient: Karina Newman           Date of Birth: 1956/03/20           MRN: [id number] Visit Date: 09/16/2016 Requested by: Cassandria Anger, MD [place], [place] [id number] PCP: Walker Kehr, MD  Subjective: Chief Complaint  Patient presents with  . Left Shoulder - Pain  . Right Leg - Pain    HPI Karina Newman is a 62 year old patient with right trochanteric groin and thigh pain.  She had a fall a year ago landing on that right hand side.  She was diagnosed with a deep bruise.  Radiographs done in November were fairly unremarkable.  His radiographs are reviewed and show maintenance of the joint space in that right acetabulum with minimal enthesopathic changes of the trochanter.  She does have some facet arthritis.  Small spur noted on the lateral socket with slight cyst formation in that femoral head on the right.  Patient also describes left shoulder pain.  She has an old injury and had no rotator cuff tear per physician in [place] in 2013.  One month ago she was reaching out and she felt a tearing sensation at the posterior inferior aspect of the shoulder.  Reports pain in the axilla and in the back of her arm.  She has known history of anterior shoulder pain following that initial evaluation in 2013.  Notably the patient does take oxycodone and morphine and uses pins head.  She is at primarily for right shoulder workmen's comp injury.            Review of Systems All systems reviewed are negative as they relate to the chief complaint within the history of present illness.  Patient denies  fevers or chills.    Assessment & Plan: Visit Diagnoses:  1. Acute pain of left shoulder   2. Left hip pain     Plan: Impression is right trochanteric groin and thigh pain of one-year duration with swelling of the trochanteric area.  She is failed conservative treatment for that.  She can't really sleep on that side.  She doesn't really have any back pain but that would  also be a consideration it is hard for her to walk.  I like it MRI of the pelvis to evaluate that right trochanteric region as well as an area of tenderness which is the hamstring attachment site on the initial tuberosities.  She may also have a component of occult arthritis.  In regards to the shoulder she may need therapy for that but we'll see her back after the MRI scan of the hip and pelvis and then we can consider visible therapy for both.  r  Follow-Up Instructions: No Follow-up on file.   Orders:  Orders Placed This Encounter  Procedures  . XR Shoulder Left   No orders of the defined types were placed in this encounter.     Procedures: No procedures performed   Clinical Data: No additional findings.  Objective: Vital Signs: There were no vitals taken for this visit.  Physical Exam   Constitutional: Patient appears well-developed HEENT:  Head: Normocephalic Eyes:EOM are normal Neck: Normal range of motion Cardiovascular: Normal rate Pulmonary/chest: Effort normal Neurologic: Patient is alert Skin: Skin is warm Psychiatric: Patient has normal mood and affect    Ortho Exam examination of the left shoulder demonstrates full active and passive range of motion with excellent rotator cuff strength isolated as prescription subcutaneous  muscle testing.  Negative O'Brien's testing negative speed's testing no real pain with crossarm adduction.  Mild acromioclavicular joint tenderness and crepitus with range of motion but no real tenderness to palpation.  Negative apprehension relocation testing.  Radial pulses intact.  Rotator cuff strength is good.  Examination of the right hip she does have tenderness but it's more distal below the trochanter on the right compared to the left.  No groin pain with interelectrode rotation leg.  Good hip flexion abduction and adduction strength.  No other masses lymph there is contusion of the right hip region.  Specialty Comments:  No  specialty comments available.  Imaging: Xr Shoulder Left  Result Date: 09/16/2016 Three-view shoulder reviewed AP axillary and outlet.  She does have a small Bennett lesion at the inferior aspect of the glenoid.  The visualized lung fields clear before meals joint appears non-arthritic.  Bones otherwise normal slightly demineralized.  No glenohumeral joint arthritis    PMFS History: Patient Active Problem List   Diagnosis Date Noted  . Acute pain of left shoulder 09/16/2016  . Left hip pain 09/16/2016  . Low vitamin B12 level 03/16/2016  . Piriformis syndrome of right side 02/04/2016  . IT band syndrome 02/04/2016  . RUQ abdominal pain 02/04/2016  . Low back pain radiating to right lower extremity 09/19/2015  . Allergic rhinitis 09/19/2015  . Chest pain, atypical 04/07/2015  . GERD (gastroesophageal reflux disease) 04/07/2015  . Axillary adenitis 03/23/2015  . Migraine headache 03/23/2015  . Hives 01/29/2015  . Edema 01/29/2015  . Cervical disc disorder with radiculopathy of cervical region 12/11/2014  . Insomnia 07/31/2014  . Degenerative cervical disc 05/08/2014  . Pain in joint, shoulder region 03/05/2014  . Patellofemoral arthralgia of both knees 03/01/2014  . Bilateral shoulder pain 10/18/2013  . Sinusitis, bacterial 08/20/2013  . URI, acute 08/14/2013  . Hemoptysis 08/14/2013  . Otitis media of left ear 08/14/2013  . Labral tear of shoulder 07/03/2013  . Hyperthyroidism 07/03/2013  . HTN (hypertension), benign 07/03/2013  . LVH (left ventricular hypertrophy) due to hypertensive disease 07/03/2013  . Vitamin D deficiency 07/03/2013   Past Medical History:  Diagnosis Date  . Abnormal cervical Pap smear with positive HPV DNA test 01/2014   Normal cytology with positive high-risk HPV 18/45. Colposcopy negative with negative ECC  . Anemia   . Autoimmune gastritis   . Fatty liver   . Gastritis   . Heart murmur   . Hypertension   . Hyperthyroidism   . Internal  hemorrhoids   . Right shoulder pain 2001   chronic pain  . Thyroid disease    Hyperthyroid. Was on PTU (Dr Hampton Abbot in [place]) - stopped in 2013, stable  . Tubular adenoma of colon     Family History  Problem Relation Age of Onset  . Kidney disease Mother     ESRD  . Hypertension Mother   . Cancer Father     lung ca  . Hypertension Sister   . Diabetes Brother   . Diabetes Paternal Grandmother   . Colon cancer Neg Hx     Past Surgical History:  Procedure Laterality Date  . CESAREAN SECTION     x 4   . HAND SURGERY Left   . SHOULDER SURGERY Right 2002  . SHOULDER SURGERY Left 2014  . TUBAL LIGATION     Social History   Occupational History  . RETIRED    Social History Main Topics  . Smoking status: Never Smoker  .  Smokeless tobacco: Never Used  . Alcohol use Yes     Comment: social  . Drug use: No  . Sexual activity: Yes    Birth control/ protection: Post-menopausal, Surgical     Comment: BTL

## 2016-09-21 ENCOUNTER — Encounter: Payer: Self-pay | Admitting: Internal Medicine

## 2016-09-21 ENCOUNTER — Ambulatory Visit (INDEPENDENT_AMBULATORY_CARE_PROVIDER_SITE_OTHER): Admitting: Internal Medicine

## 2016-09-21 ENCOUNTER — Inpatient Hospital Stay: Admission: RE | Admit: 2016-09-21 | Payer: Self-pay | Source: Ambulatory Visit

## 2016-09-21 DIAGNOSIS — M545 Low back pain: Secondary | ICD-10-CM

## 2016-09-21 DIAGNOSIS — F5101 Primary insomnia: Secondary | ICD-10-CM | POA: Diagnosis not present

## 2016-09-21 DIAGNOSIS — M25511 Pain in right shoulder: Secondary | ICD-10-CM

## 2016-09-21 DIAGNOSIS — G8929 Other chronic pain: Secondary | ICD-10-CM | POA: Diagnosis not present

## 2016-09-21 DIAGNOSIS — M5010 Cervical disc disorder with radiculopathy, unspecified cervical region: Secondary | ICD-10-CM | POA: Diagnosis not present

## 2016-09-21 DIAGNOSIS — M79604 Pain in right leg: Secondary | ICD-10-CM

## 2016-09-21 DIAGNOSIS — M25512 Pain in left shoulder: Secondary | ICD-10-CM | POA: Diagnosis not present

## 2016-09-21 DIAGNOSIS — E0590 Thyrotoxicosis, unspecified without thyrotoxic crisis or storm: Secondary | ICD-10-CM | POA: Diagnosis not present

## 2016-09-21 MED ORDER — OXYCODONE HCL 15 MG PO TABS: 15.0000 mg | ORAL_TABLET | Freq: Three times a day (TID) | ORAL | 0 refills | Status: DC | PRN

## 2016-09-21 MED ORDER — DICLOFENAC SODIUM 1.5 % TD SOLN: 5.0000 [drp] | Freq: Four times a day (QID) | TRANSDERMAL | 6 refills | Status: DC | PRN

## 2016-09-21 MED ORDER — MORPHINE SULFATE ER BEADS 60 MG PO CP24: 60.0000 mg | ORAL_CAPSULE | Freq: Every day | ORAL | 0 refills | Status: DC

## 2016-09-21 NOTE — Progress Notes (Signed)
Subjective:  Patient ID: Karina Newman, female    DOB: July 05, 1956  Age: 61 y.o. MRN: [id number]  CC: No chief complaint on file.   HPI Karina Newman presents for chronic pain - R shoulder f/u. F/u goiter (h/o PTU use) Pt went to see Dr Marlou Sa - Ortho for LBP  Outpatient Medications Prior to Visit  Medication Sig Dispense Refill  . amLODipine (NORVASC) 5 MG tablet Take 1 tablet (5 mg total) by mouth daily. 90 tablet 3  . Cholecalciferol (VITAMIN D3) 2000 units capsule Take 1 capsule (2,000 Units total) by mouth daily. 100 capsule 3  . Cyanocobalamin (VITAMIN B-12) 500 MCG SUBL 1 sl qd 100 tablet 5  . Diclofenac Sodium (PENNSAID) 1.5 % SOLN Place 0.3 mLs onto the skin 4 (four) times daily as needed. 150 mL 6  . fluticasone (FLONASE) 50 MCG/ACT nasal spray Place 1 spray into both nostrils as needed.     Marland Kitchen morphine (AVINZA) 60 MG 24 hr capsule Take 1 capsule (60 mg total) by mouth daily. 30 capsule 0  . omeprazole (PRILOSEC) 40 MG capsule Take 1 capsule by mouth as needed.     Marland Kitchen oxyCODONE (ROXICODONE) 15 MG immediate release tablet Take 1 tablet (15 mg total) by mouth every 8 (eight) hours as needed for pain. 90 tablet 0  . pseudoephedrine (SUDAFED) 120 MG 12 hr tablet Take 1 tablet (120 mg total) by mouth 2 (two) times daily as needed for congestion. 30 tablet 1  . ranitidine (ZANTAC) 150 MG tablet Take 1 tablet (150 mg total) by mouth 2 (two) times daily. 60 tablet 3  . sucralfate (CARAFATE) 1 g tablet TAKE 1 TABLET BY MOUTH 3 TIMES A DAY BETWEEN MEALS 90 tablet 2  . telmisartan-hydrochlorothiazide (MICARDIS HCT) 80-25 MG tablet TAKE 1 TABLET BY MOUTH EVERY DAY 90 tablet 2  . tiZANidine (ZANAFLEX) 4 MG tablet Take 1 tablet (4 mg total) by mouth every 8 (eight) hours as needed for muscle spasms. 60 tablet 1   Facility-Administered Medications Prior to Visit  Medication Dose Route Frequency Provider Last Rate Last Dose  . 0.9 %  sodium chloride infusion  500 mL Intravenous Continuous Manus Gunning, MD        ROS Review of Systems  Constitutional: Positive for fatigue. Negative for activity change, appetite change, chills and unexpected weight change.  HENT: Negative for congestion, mouth sores and sinus pressure.   Eyes: Negative for visual disturbance.  Respiratory: Negative for cough and chest tightness.   Gastrointestinal: Negative for abdominal pain and nausea.  Genitourinary: Negative for difficulty urinating, frequency and vaginal pain.  Musculoskeletal: Positive for arthralgias, back pain and neck pain. Negative for gait problem.  Skin: Negative for pallor and rash.  Neurological: Negative for dizziness, tremors, weakness, numbness and headaches.  Psychiatric/Behavioral: Negative for confusion and sleep disturbance.    Objective:  BP 140/80   Pulse 92   Temp 98.5 F (36.9 C) (Oral)   Resp 20   Wt 168 lb (76.2 kg)   SpO2 97%   BMI 28.84 kg/m   BP Readings from Last 3 Encounters:  09/21/16 140/80  08/27/16 (!) 148/88  07/28/16 (!) 160/90    Wt Readings from Last 3 Encounters:  09/21/16 168 lb (76.2 kg)  08/27/16 165 lb 4 oz (75 kg)  07/28/16 167 lb (75.8 kg)    Physical Exam  Constitutional: She appears well-developed. No distress.  HENT:  Head: Normocephalic.  Right Ear: External ear normal.  Left Ear:  External ear normal.  Nose: Nose normal.  Mouth/Throat: Oropharynx is clear and moist.  Eyes: Conjunctivae are normal. Pupils are equal, round, and reactive to light. Right eye exhibits no discharge. Left eye exhibits no discharge.  Neck: Normal range of motion. Neck supple. No JVD present. No tracheal deviation present. Thyromegaly present.  Cardiovascular: Normal rate, regular rhythm and normal heart sounds.   Pulmonary/Chest: No stridor. No respiratory distress. She has no wheezes.  Abdominal: Soft. Bowel sounds are normal. She exhibits no distension and no mass. There is no tenderness. There is no rebound and no guarding.    Musculoskeletal: She exhibits tenderness. She exhibits no edema.  Lymphadenopathy:    She has no cervical adenopathy.  Neurological: She displays normal reflexes. No cranial nerve deficit. She exhibits normal muscle tone. Coordination normal.  Skin: No rash noted. No erythema.  Psychiatric: She has a normal mood and affect. Her behavior is normal. Judgment and thought content normal.  R shoulder pain  Lab Results  Component Value Date   WBC 7.8 08/27/2016   HGB 14.1 08/27/2016   HCT 41.2 08/27/2016   PLT 406.0 (H) 08/27/2016   GLUCOSE 106 (H) 08/27/2016   CHOL 171 10/16/2015   TRIG 119.0 10/16/2015   HDL 42.20 10/16/2015   LDLCALC 105 (H) 10/16/2015   ALT 21 08/27/2016   AST 17 08/27/2016   NA 139 08/27/2016   K 3.5 08/27/2016   CL 100 08/27/2016   CREATININE 0.78 08/27/2016   BUN 10 08/27/2016   CO2 31 08/27/2016   TSH 0.28 (L) 08/27/2016    Ct Abdomen Pelvis Wo Contrast  Result Date: 09/09/2016 CLINICAL DATA:  Right lower quadrant tenderness over the last 6 months. EXAM: CT ABDOMEN AND PELVIS WITHOUT CONTRAST TECHNIQUE: Multidetector CT imaging of the abdomen and pelvis was performed following the standard protocol without IV contrast. COMPARISON:  Abdominal ultrasound 03/04/2016 FINDINGS: Lower chest: Mild subsegmental atelectasis or scarring in the right middle lobe. Otherwise unremarkable where included. Hepatobiliary: Minimal hepatic steatosis.  Gallbladder unremarkable. Pancreas: Unremarkable Spleen: Unremarkable Adrenals/Urinary Tract: Unremarkable Stomach/Bowel: Prominent stool throughout the colon favors constipation. The distal appendix is immediately adjacent to the right ovary/adnexa and accordingly tip is obscured, but the proximal appendix is well seen and appears normal. Vascular/Lymphatic: Unremarkable Reproductive: Unremarkable Other: No supplemental non-categorized findings. Musculoskeletal: Small bilateral indirect inguinal hernias contain adipose tissue. Small  umbilical hernia contains adipose tissue. No lumbar impingement. IMPRESSION: 1.  Prominent stool throughout the colon favors constipation. 2. Minimal hepatic steatosis. 3. Small bilateral indirect inguinal hernias and small umbilical hernia containing adipose tissue. 4. No renal calculi identified. 5. The proximal and mid appendix appear normal, the appendiceal tip is obscured by the adjacent ovary/adnexa. Electronically Signed   By: Van Clines M.D.   On: 09/09/2016 13:27    Assessment & Plan:   There are no diagnoses linked to this encounter. I am having Ms. Grudzien maintain her pseudoephedrine, Vitamin D3, Vitamin B-12, fluticasone, omeprazole, tiZANidine, Diclofenac Sodium, oxyCODONE, morphine, amLODipine, sucralfate, ranitidine, and telmisartan-hydrochlorothiazide. We will continue to administer sodium chloride.  No orders of the defined types were placed in this encounter.    Follow-up: No Follow-up on file.  Walker Kehr, MD

## 2016-09-21 NOTE — Progress Notes (Signed)
Pre visit review using our clinic review tool, if applicable. No additional management support is needed unless otherwise documented below in the visit note. 

## 2016-09-21 NOTE — Assessment & Plan Note (Signed)
TSH, FT4 in 2-3 mo

## 2016-09-21 NOTE — Assessment & Plan Note (Signed)
meds reniewed

## 2016-09-21 NOTE — Assessment & Plan Note (Signed)
Reniewed pain meds  Potential benefits of a long term opioids use as well as potential risks (i.e. addiction risk, apnea etc) and complications (i.e. Somnolence, constipation and others) were explained to the patient and were aknowledged. UDS

## 2016-09-21 NOTE — Patient Instructions (Addendum)
Try TRE - Trauma Release Exercise  You can get a "Stress Less TRE" application  Valerian root, Melotonin for sleep

## 2016-09-21 NOTE — Assessment & Plan Note (Addendum)
F/u w/Dr Marlou Sa Try TRE - Trauma Release Exercise  You can get a "Stress Less TRE" application

## 2016-09-21 NOTE — Assessment & Plan Note (Signed)
Valerian root, Melotonin for sleep

## 2016-09-22 ENCOUNTER — Encounter: Payer: Self-pay | Admitting: Internal Medicine

## 2016-09-27 ENCOUNTER — Other Ambulatory Visit: Payer: Self-pay

## 2016-09-29 ENCOUNTER — Ambulatory Visit (INDEPENDENT_AMBULATORY_CARE_PROVIDER_SITE_OTHER): Payer: Federal, State, Local not specified - PPO | Admitting: Orthopedic Surgery

## 2016-10-27 ENCOUNTER — Ambulatory Visit (INDEPENDENT_AMBULATORY_CARE_PROVIDER_SITE_OTHER): Payer: Federal, State, Local not specified - PPO | Admitting: Internal Medicine

## 2016-10-27 ENCOUNTER — Encounter: Payer: Self-pay | Admitting: Internal Medicine

## 2016-10-27 DIAGNOSIS — R519 Headache, unspecified: Secondary | ICD-10-CM | POA: Insufficient documentation

## 2016-10-27 DIAGNOSIS — G4489 Other headache syndrome: Secondary | ICD-10-CM | POA: Diagnosis not present

## 2016-10-27 DIAGNOSIS — M545 Low back pain: Secondary | ICD-10-CM

## 2016-10-27 DIAGNOSIS — M25511 Pain in right shoulder: Secondary | ICD-10-CM

## 2016-10-27 DIAGNOSIS — M79604 Pain in right leg: Secondary | ICD-10-CM

## 2016-10-27 DIAGNOSIS — R51 Headache: Secondary | ICD-10-CM

## 2016-10-27 MED ORDER — ZOLMITRIPTAN 5 MG NA SOLN: 1.0000 | NASAL | 5 refills | Status: DC | PRN

## 2016-10-27 NOTE — Assessment & Plan Note (Signed)
Handicapped form DMV - filled out

## 2016-10-27 NOTE — Assessment & Plan Note (Signed)
Zomig nasal prn

## 2016-10-27 NOTE — Assessment & Plan Note (Signed)
10% function loss

## 2016-10-27 NOTE — Progress Notes (Signed)
Pre-visit discussion using our clinic review tool. No additional management support is needed unless otherwise documented below in the visit note.  

## 2016-10-27 NOTE — Progress Notes (Signed)
Subjective:  Patient ID: Karina Newman, female    DOB: Mar 16, 1956  Age: 61 y.o. MRN: [id number]  CC: Headache (3 weeks,  band around the head, frontal intense pain, throbbing, aches, sensitivity to light noise and smell ) and Back Pain (left upper back to left side of neck spasms )   HPI Olamae Ferrara presents for severe HAs x 3 weeks. Taking Excedrin prn. No n/v  Outpatient Medications Prior to Visit  Medication Sig Dispense Refill  . amLODipine (NORVASC) 5 MG tablet Take 1 tablet (5 mg total) by mouth daily. 90 tablet 3  . Cholecalciferol (VITAMIN D3) 2000 units capsule Take 1 capsule (2,000 Units total) by mouth daily. 100 capsule 3  . Cyanocobalamin (VITAMIN B-12) 500 MCG SUBL 1 sl qd 100 tablet 5  . Diclofenac Sodium (PENNSAID) 1.5 % SOLN Place 0.3 mLs onto the skin 4 (four) times daily as needed. 150 mL 6  . fluticasone (FLONASE) 50 MCG/ACT nasal spray Place 1 spray into both nostrils as needed.     Marland Kitchen morphine (AVINZA) 60 MG 24 hr capsule Take 1 capsule (60 mg total) by mouth daily. 30 capsule 0  . omeprazole (PRILOSEC) 40 MG capsule Take 1 capsule by mouth as needed.     Marland Kitchen oxyCODONE (ROXICODONE) 15 MG immediate release tablet Take 1 tablet (15 mg total) by mouth every 8 (eight) hours as needed for pain. 90 tablet 0  . pseudoephedrine (SUDAFED) 120 MG 12 hr tablet Take 1 tablet (120 mg total) by mouth 2 (two) times daily as needed for congestion. 30 tablet 1  . ranitidine (ZANTAC) 150 MG tablet Take 1 tablet (150 mg total) by mouth 2 (two) times daily. 60 tablet 3  . sucralfate (CARAFATE) 1 g tablet TAKE 1 TABLET BY MOUTH 3 TIMES A DAY BETWEEN MEALS 90 tablet 2  . telmisartan-hydrochlorothiazide (MICARDIS HCT) 80-25 MG tablet TAKE 1 TABLET BY MOUTH EVERY DAY 90 tablet 2  . tiZANidine (ZANAFLEX) 4 MG tablet Take 1 tablet (4 mg total) by mouth every 8 (eight) hours as needed for muscle spasms. 60 tablet 1   Facility-Administered Medications Prior to Visit  Medication Dose Route  Frequency Provider Last Rate Last Dose  . 0.9 %  sodium chloride infusion  500 mL Intravenous Continuous Manus Gunning, MD        ROS Review of Systems  Constitutional: Positive for fatigue. Negative for activity change, appetite change, chills and unexpected weight change.  HENT: Negative for congestion, mouth sores and sinus pressure.   Eyes: Negative for visual disturbance.  Respiratory: Negative for cough and chest tightness.   Gastrointestinal: Negative for abdominal pain and nausea.  Genitourinary: Negative for difficulty urinating, frequency and vaginal pain.  Musculoskeletal: Positive for arthralgias, back pain, neck pain and neck stiffness. Negative for gait problem.  Skin: Negative for pallor and rash.  Neurological: Positive for headaches. Negative for dizziness, tremors, weakness and numbness.       L>R head pain  Psychiatric/Behavioral: Negative for confusion and sleep disturbance.    Objective:  BP (!) 142/82   Pulse 74   Temp 98.3 F (36.8 C) (Oral)   Resp 16   Ht 5\' 4"  (1.626 m)   Wt 169 lb 12 oz (77 kg)   SpO2 96%   BMI 29.14 kg/m   BP Readings from Last 3 Encounters:  10/27/16 (!) 142/82  09/21/16 140/80  08/27/16 (!) 148/88    Wt Readings from Last 3 Encounters:  10/27/16 169 lb 12 oz (  77 kg)  09/21/16 168 lb (76.2 kg)  08/27/16 165 lb 4 oz (75 kg)    Physical Exam  Constitutional: She appears well-developed. No distress.  HENT:  Head: Normocephalic.  Right Ear: External ear normal.  Left Ear: External ear normal.  Nose: Nose normal.  Mouth/Throat: Oropharynx is clear and moist.  Eyes: Conjunctivae are normal. Pupils are equal, round, and reactive to light. Right eye exhibits no discharge. Left eye exhibits no discharge.  Neck: Normal range of motion. Neck supple. No JVD present. No tracheal deviation present. No thyromegaly present.  Cardiovascular: Normal rate, regular rhythm and normal heart sounds.   Pulmonary/Chest: No stridor. No  respiratory distress. She has no wheezes.  Abdominal: Soft. Bowel sounds are normal. She exhibits no distension and no mass. There is no tenderness. There is no rebound and no guarding.  Musculoskeletal: She exhibits tenderness. She exhibits no edema.  Lymphadenopathy:    She has no cervical adenopathy.  Neurological: She displays normal reflexes. No cranial nerve deficit. She exhibits normal muscle tone. Coordination normal.  Skin: No rash noted. No erythema.  Psychiatric: She has a normal mood and affect. Her behavior is normal. Judgment and thought content normal.    Lab Results  Component Value Date   WBC 7.8 08/27/2016   HGB 14.1 08/27/2016   HCT 41.2 08/27/2016   PLT 406.0 (H) 08/27/2016   GLUCOSE 106 (H) 08/27/2016   CHOL 171 10/16/2015   TRIG 119.0 10/16/2015   HDL 42.20 10/16/2015   LDLCALC 105 (H) 10/16/2015   ALT 21 08/27/2016   AST 17 08/27/2016   NA 139 08/27/2016   K 3.5 08/27/2016   CL 100 08/27/2016   CREATININE 0.78 08/27/2016   BUN 10 08/27/2016   CO2 31 08/27/2016   TSH 0.28 (L) 08/27/2016    Ct Abdomen Pelvis Wo Contrast  Result Date: 09/09/2016 CLINICAL DATA:  Right lower quadrant tenderness over the last 6 months. EXAM: CT ABDOMEN AND PELVIS WITHOUT CONTRAST TECHNIQUE: Multidetector CT imaging of the abdomen and pelvis was performed following the standard protocol without IV contrast. COMPARISON:  Abdominal ultrasound 03/04/2016 FINDINGS: Lower chest: Mild subsegmental atelectasis or scarring in the right middle lobe. Otherwise unremarkable where included. Hepatobiliary: Minimal hepatic steatosis.  Gallbladder unremarkable. Pancreas: Unremarkable Spleen: Unremarkable Adrenals/Urinary Tract: Unremarkable Stomach/Bowel: Prominent stool throughout the colon favors constipation. The distal appendix is immediately adjacent to the right ovary/adnexa and accordingly tip is obscured, but the proximal appendix is well seen and appears normal. Vascular/Lymphatic:  Unremarkable Reproductive: Unremarkable Other: No supplemental non-categorized findings. Musculoskeletal: Small bilateral indirect inguinal hernias contain adipose tissue. Small umbilical hernia contains adipose tissue. No lumbar impingement. IMPRESSION: 1.  Prominent stool throughout the colon favors constipation. 2. Minimal hepatic steatosis. 3. Small bilateral indirect inguinal hernias and small umbilical hernia containing adipose tissue. 4. No renal calculi identified. 5. The proximal and mid appendix appear normal, the appendiceal tip is obscured by the adjacent ovary/adnexa. Electronically Signed   By: Van Clines M.D.   On: 09/09/2016 13:27    Assessment & Plan:   There are no diagnoses linked to this encounter. I am having Ms. Fennel maintain her pseudoephedrine, Vitamin D3, Vitamin B-12, fluticasone, omeprazole, tiZANidine, amLODipine, sucralfate, ranitidine, telmisartan-hydrochlorothiazide, oxyCODONE, morphine, Diclofenac Sodium, and morphine. We will continue to administer sodium chloride.  Meds ordered this encounter  Medications  . morphine (MS CONTIN) 60 MG 12 hr tablet    Sig: Take by mouth daily.    Refill:  0  Follow-up: No Follow-up on file.  Walker Kehr, MD

## 2016-10-28 ENCOUNTER — Telehealth: Payer: Self-pay | Admitting: Internal Medicine

## 2016-10-28 NOTE — Telephone Encounter (Signed)
Pt called in and said that she went to pick the nose spray that was called in and it was to much.  She would like to know if he could call in the pill that he spoke of   This is for her migraines

## 2016-10-28 NOTE — Telephone Encounter (Signed)
Routing to dr plotnikov, please advise, thanks 

## 2016-10-29 MED ORDER — SUMATRIPTAN SUCCINATE 100 MG PO TABS
100.0000 mg | ORAL_TABLET | ORAL | 5 refills | Status: DC | PRN
Start: 2016-10-29 — End: 2018-05-17

## 2016-10-29 NOTE — Telephone Encounter (Signed)
Yes Imitrex tabs Thx

## 2016-12-23 ENCOUNTER — Encounter: Payer: Self-pay | Admitting: Internal Medicine

## 2016-12-23 ENCOUNTER — Ambulatory Visit (INDEPENDENT_AMBULATORY_CARE_PROVIDER_SITE_OTHER): Admitting: Internal Medicine

## 2016-12-23 DIAGNOSIS — M25511 Pain in right shoulder: Secondary | ICD-10-CM | POA: Diagnosis not present

## 2016-12-23 DIAGNOSIS — M25512 Pain in left shoulder: Secondary | ICD-10-CM

## 2016-12-23 DIAGNOSIS — S43439S Superior glenoid labrum lesion of unspecified shoulder, sequela: Secondary | ICD-10-CM

## 2016-12-23 DIAGNOSIS — G8929 Other chronic pain: Secondary | ICD-10-CM | POA: Diagnosis not present

## 2016-12-23 DIAGNOSIS — I10 Essential (primary) hypertension: Secondary | ICD-10-CM | POA: Diagnosis not present

## 2016-12-23 DIAGNOSIS — E538 Deficiency of other specified B group vitamins: Secondary | ICD-10-CM | POA: Diagnosis not present

## 2016-12-23 MED ORDER — MORPHINE SULFATE ER 30 MG PO TBCR: 30.0000 mg | EXTENDED_RELEASE_TABLET | Freq: Two times a day (BID) | ORAL | 0 refills | Status: DC

## 2016-12-23 MED ORDER — OXYCODONE HCL 15 MG PO TABS: 15.0000 mg | ORAL_TABLET | Freq: Two times a day (BID) | ORAL | 0 refills | Status: DC | PRN

## 2016-12-23 NOTE — Progress Notes (Signed)
Subjective:  Patient ID: Lexani Corona, female    DOB: 1955/08/25  Age: 61 y.o. MRN: [id number]  CC: No chief complaint on file.   HPI Auriah Hollings presents for chronic pain, B12 def, HAs f/u C/o Avinza being n/a at the drug stores on a regular bases Using less pain meds  Outpatient Medications Prior to Visit  Medication Sig Dispense Refill  . amLODipine (NORVASC) 5 MG tablet Take 1 tablet (5 mg total) by mouth daily. 90 tablet 3  . Cholecalciferol (VITAMIN D3) 2000 units capsule Take 1 capsule (2,000 Units total) by mouth daily. 100 capsule 3  . Cyanocobalamin (VITAMIN B-12) 500 MCG SUBL 1 sl qd 100 tablet 5  . Diclofenac Sodium (PENNSAID) 1.5 % SOLN Place 0.3 mLs onto the skin 4 (four) times daily as needed. 150 mL 6  . fluticasone (FLONASE) 50 MCG/ACT nasal spray Place 1 spray into both nostrils as needed.     Marland Kitchen morphine (AVINZA) 60 MG 24 hr capsule Take 1 capsule (60 mg total) by mouth daily. 30 capsule 0  . morphine (MS CONTIN) 60 MG 12 hr tablet Take by mouth daily.  0  . omeprazole (PRILOSEC) 40 MG capsule Take 1 capsule by mouth as needed.     Marland Kitchen oxyCODONE (ROXICODONE) 15 MG immediate release tablet Take 1 tablet (15 mg total) by mouth every 8 (eight) hours as needed for pain. 90 tablet 0  . pseudoephedrine (SUDAFED) 120 MG 12 hr tablet Take 1 tablet (120 mg total) by mouth 2 (two) times daily as needed for congestion. 30 tablet 1  . ranitidine (ZANTAC) 150 MG tablet Take 1 tablet (150 mg total) by mouth 2 (two) times daily. 60 tablet 3  . sucralfate (CARAFATE) 1 g tablet TAKE 1 TABLET BY MOUTH 3 TIMES A DAY BETWEEN MEALS 90 tablet 2  . SUMAtriptan (IMITREX) 100 MG tablet Take 1 tablet (100 mg total) by mouth every 2 (two) hours as needed for migraine. May repeat in 2 hours if headache persists or recurs. 12 tablet 5  . telmisartan-hydrochlorothiazide (MICARDIS HCT) 80-25 MG tablet TAKE 1 TABLET BY MOUTH EVERY DAY 90 tablet 2  . tiZANidine (ZANAFLEX) 4 MG tablet Take 1 tablet (4  mg total) by mouth every 8 (eight) hours as needed for muscle spasms. 60 tablet 1  . zolmitriptan (ZOMIG) 5 MG nasal solution Place 1 spray into the nose as needed for migraine. 6 Units 5   Facility-Administered Medications Prior to Visit  Medication Dose Route Frequency Provider Last Rate Last Dose  . 0.9 %  sodium chloride infusion  500 mL Intravenous Continuous Armbruster, Renelda Loma, MD        ROS Review of Systems  Constitutional: Positive for fatigue. Negative for activity change, appetite change, chills and unexpected weight change.  HENT: Negative for congestion, mouth sores and sinus pressure.   Eyes: Negative for visual disturbance.  Respiratory: Negative for cough and chest tightness.   Gastrointestinal: Negative for abdominal pain and nausea.  Genitourinary: Negative for difficulty urinating, frequency and vaginal pain.  Musculoskeletal: Positive for arthralgias and neck pain. Negative for back pain and gait problem.  Skin: Negative for pallor and rash.  Neurological: Negative for dizziness, tremors, weakness, numbness and headaches.  Psychiatric/Behavioral: Negative for confusion and sleep disturbance.    Objective:  BP (!) 144/88 (BP Location: Right Arm, Patient Position: Sitting, Cuff Size: Normal)   Pulse 91   Temp 98.9 F (37.2 C) (Oral)   Ht 5\' 4"  (1.626 m)  Wt 167 lb (75.8 kg)   SpO2 98%   BMI 28.67 kg/m   BP Readings from Last 3 Encounters:  12/23/16 (!) 144/88  10/27/16 (!) 142/82  09/21/16 140/80    Wt Readings from Last 3 Encounters:  12/23/16 167 lb (75.8 kg)  10/27/16 169 lb 12 oz (77 kg)  09/21/16 168 lb (76.2 kg)    Physical Exam  Constitutional: She appears well-developed. No distress.  HENT:  Head: Normocephalic.  Right Ear: External ear normal.  Left Ear: External ear normal.  Nose: Nose normal.  Mouth/Throat: Oropharynx is clear and moist.  Eyes: Conjunctivae are normal. Pupils are equal, round, and reactive to light. Right eye  exhibits no discharge. Left eye exhibits no discharge.  Neck: Normal range of motion. Neck supple. No JVD present. No tracheal deviation present. No thyromegaly present.  Cardiovascular: Normal rate, regular rhythm and normal heart sounds.   Pulmonary/Chest: No stridor. No respiratory distress. She has no wheezes.  Abdominal: Soft. Bowel sounds are normal. She exhibits no distension and no mass. There is no tenderness. There is no rebound and no guarding.  Musculoskeletal: She exhibits tenderness. She exhibits no edema.  Lymphadenopathy:    She has no cervical adenopathy.  Neurological: She displays normal reflexes. No cranial nerve deficit. She exhibits normal muscle tone. Coordination normal.  Skin: No rash noted. No erythema.  Psychiatric: She has a normal mood and affect. Her behavior is normal. Judgment and thought content normal.  painful neck and shoulders  Lab Results  Component Value Date   WBC 7.8 08/27/2016   HGB 14.1 08/27/2016   HCT 41.2 08/27/2016   PLT 406.0 (H) 08/27/2016   GLUCOSE 106 (H) 08/27/2016   CHOL 171 10/16/2015   TRIG 119.0 10/16/2015   HDL 42.20 10/16/2015   LDLCALC 105 (H) 10/16/2015   ALT 21 08/27/2016   AST 17 08/27/2016   NA 139 08/27/2016   K 3.5 08/27/2016   CL 100 08/27/2016   CREATININE 0.78 08/27/2016   BUN 10 08/27/2016   CO2 31 08/27/2016   TSH 0.28 (L) 08/27/2016    Ct Abdomen Pelvis Wo Contrast  Result Date: 09/09/2016 CLINICAL DATA:  Right lower quadrant tenderness over the last 6 months. EXAM: CT ABDOMEN AND PELVIS WITHOUT CONTRAST TECHNIQUE: Multidetector CT imaging of the abdomen and pelvis was performed following the standard protocol without IV contrast. COMPARISON:  Abdominal ultrasound 03/04/2016 FINDINGS: Lower chest: Mild subsegmental atelectasis or scarring in the right middle lobe. Otherwise unremarkable where included. Hepatobiliary: Minimal hepatic steatosis.  Gallbladder unremarkable. Pancreas: Unremarkable Spleen:  Unremarkable Adrenals/Urinary Tract: Unremarkable Stomach/Bowel: Prominent stool throughout the colon favors constipation. The distal appendix is immediately adjacent to the right ovary/adnexa and accordingly tip is obscured, but the proximal appendix is well seen and appears normal. Vascular/Lymphatic: Unremarkable Reproductive: Unremarkable Other: No supplemental non-categorized findings. Musculoskeletal: Small bilateral indirect inguinal hernias contain adipose tissue. Small umbilical hernia contains adipose tissue. No lumbar impingement. IMPRESSION: 1.  Prominent stool throughout the colon favors constipation. 2. Minimal hepatic steatosis. 3. Small bilateral indirect inguinal hernias and small umbilical hernia containing adipose tissue. 4. No renal calculi identified. 5. The proximal and mid appendix appear normal, the appendiceal tip is obscured by the adjacent ovary/adnexa. Electronically Signed   By: Van Clines M.D.   On: 09/09/2016 13:27    Assessment & Plan:   There are no diagnoses linked to this encounter. I am having Ms. Rexroad maintain her pseudoephedrine, Vitamin D3, Vitamin B-12, fluticasone, omeprazole, tiZANidine, amLODipine, sucralfate,  ranitidine, telmisartan-hydrochlorothiazide, oxyCODONE, morphine, Diclofenac Sodium, morphine, zolmitriptan, and SUMAtriptan. We will continue to administer sodium chloride.  No orders of the defined types were placed in this encounter.    Follow-up: No Follow-up on file.  Walker Kehr, MD

## 2016-12-23 NOTE — Assessment & Plan Note (Signed)
Chronic R>L On MS contin/OXY Using less meds - dose reduced - see Rx

## 2016-12-23 NOTE — Assessment & Plan Note (Signed)
C/o Avinza being n/a at the drug stores on a regular bases - change to MS contin; reduce Oxy to bid

## 2016-12-23 NOTE — Assessment & Plan Note (Signed)
On B12 

## 2016-12-23 NOTE — Assessment & Plan Note (Signed)
Amlodipine, Micardis 

## 2017-01-19 ENCOUNTER — Other Ambulatory Visit (INDEPENDENT_AMBULATORY_CARE_PROVIDER_SITE_OTHER): Payer: Federal, State, Local not specified - PPO

## 2017-01-19 ENCOUNTER — Encounter: Payer: Self-pay | Admitting: Internal Medicine

## 2017-01-19 ENCOUNTER — Ambulatory Visit (INDEPENDENT_AMBULATORY_CARE_PROVIDER_SITE_OTHER): Payer: Federal, State, Local not specified - PPO | Admitting: Internal Medicine

## 2017-01-19 VITALS — BP 132/86 | HR 81 | Temp 98.7°F | Ht 64.0 in | Wt 166.0 lb

## 2017-01-19 DIAGNOSIS — Z0000 Encounter for general adult medical examination without abnormal findings: Secondary | ICD-10-CM

## 2017-01-19 DIAGNOSIS — R1013 Epigastric pain: Secondary | ICD-10-CM

## 2017-01-19 DIAGNOSIS — R109 Unspecified abdominal pain: Secondary | ICD-10-CM | POA: Insufficient documentation

## 2017-01-19 DIAGNOSIS — Z729 Problem related to lifestyle, unspecified: Secondary | ICD-10-CM

## 2017-01-19 LAB — URINALYSIS
Bilirubin Urine: NEGATIVE
Ketones, ur: NEGATIVE
Leukocytes, UA: NEGATIVE
Nitrite: NEGATIVE
SPECIFIC GRAVITY, URINE: 1.02 (ref 1.000–1.030)
Total Protein, Urine: NEGATIVE
URINE GLUCOSE: NEGATIVE
Urobilinogen, UA: 0.2 (ref 0.0–1.0)
pH: 6 (ref 5.0–8.0)

## 2017-01-19 LAB — CBC WITH DIFFERENTIAL/PLATELET
BASOS ABS: 0 10*3/uL (ref 0.0–0.1)
Basophils Relative: 0.9 % (ref 0.0–3.0)
EOS PCT: 1 % (ref 0.0–5.0)
Eosinophils Absolute: 0 10*3/uL (ref 0.0–0.7)
HCT: 38.9 % (ref 36.0–46.0)
Hemoglobin: 13.4 g/dL (ref 12.0–15.0)
Lymphocytes Relative: 30.6 % (ref 12.0–46.0)
Lymphs Abs: 1.5 10*3/uL (ref 0.7–4.0)
MCHC: 34.5 g/dL (ref 30.0–36.0)
MCV: 92.9 fl (ref 78.0–100.0)
MONOS PCT: 6.7 % (ref 3.0–12.0)
Monocytes Absolute: 0.3 10*3/uL (ref 0.1–1.0)
NEUTROS ABS: 3.1 10*3/uL (ref 1.4–7.7)
Neutrophils Relative %: 60.8 % (ref 43.0–77.0)
PLATELETS: 329 10*3/uL (ref 150.0–400.0)
RBC: 4.19 Mil/uL (ref 3.87–5.11)
RDW: 12.1 % (ref 11.5–15.5)
WBC: 5 10*3/uL (ref 4.0–10.5)

## 2017-01-19 LAB — BASIC METABOLIC PANEL
BUN: 10 mg/dL (ref 6–23)
CALCIUM: 9.8 mg/dL (ref 8.4–10.5)
CO2: 31 meq/L (ref 19–32)
Chloride: 102 mEq/L (ref 96–112)
Creatinine, Ser: 0.87 mg/dL (ref 0.40–1.20)
GFR: 85.43 mL/min (ref >60.00)
Glucose, Bld: 112 mg/dL — ABNORMAL HIGH (ref 70–99)
POTASSIUM: 3.9 meq/L (ref 3.5–5.1)
SODIUM: 140 meq/L (ref 135–145)

## 2017-01-19 LAB — T4, FREE: Free T4: 0.76 ng/dL (ref 0.60–1.60)

## 2017-01-19 LAB — LIPID PANEL
CHOLESTEROL: 169 mg/dL (ref 0–200)
HDL: 44.7 mg/dL (ref >39.00)
LDL CALC: 105 mg/dL — AB (ref 0–99)
NonHDL: 124.18
TRIGLYCERIDES: 95 mg/dL (ref 0.0–149.0)
Total CHOL/HDL Ratio: 4
VLDL: 19 mg/dL (ref 0.0–40.0)

## 2017-01-19 LAB — HEPATIC FUNCTION PANEL
ALBUMIN: 4.4 g/dL (ref 3.5–5.2)
ALT: 25 U/L (ref 0–35)
AST: 18 U/L (ref 0–37)
Alkaline Phosphatase: 81 U/L (ref 39–117)
Bilirubin, Direct: 0.1 mg/dL (ref 0.0–0.3)
TOTAL PROTEIN: 7.9 g/dL (ref 6.0–8.3)
Total Bilirubin: 0.5 mg/dL (ref 0.2–1.2)

## 2017-01-19 LAB — TSH: TSH: 0.57 u[IU]/mL (ref 0.35–4.50)

## 2017-01-19 LAB — HEPATITIS C ANTIBODY: HCV Ab: NEGATIVE

## 2017-01-19 NOTE — Progress Notes (Signed)
Subjective:  Patient ID: Karina Newman, female    DOB: 18-May-1956  Age: 61 y.o. MRN: [id number]  CC: No chief complaint on file.   HPI Karina Newman presents for a well exam C/o abd pain above the belly button - chronic  Outpatient Medications Prior to Visit  Medication Sig Dispense Refill  . amLODipine (NORVASC) 5 MG tablet Take 1 tablet (5 mg total) by mouth daily. 90 tablet 3  . Cholecalciferol (VITAMIN D3) 2000 units capsule Take 1 capsule (2,000 Units total) by mouth daily. 100 capsule 3  . Cyanocobalamin (VITAMIN B-12) 500 MCG SUBL 1 sl qd 100 tablet 5  . Diclofenac Sodium (PENNSAID) 1.5 % SOLN Place 0.3 mLs onto the skin 4 (four) times daily as needed. 150 mL 6  . fluticasone (FLONASE) 50 MCG/ACT nasal spray Place 1 spray into both nostrils as needed.     Marland Kitchen morphine (MS CONTIN) 30 MG 12 hr tablet Take 1 tablet (30 mg total) by mouth every 12 (twelve) hours. 60 tablet 0  . omeprazole (PRILOSEC) 40 MG capsule Take 1 capsule by mouth as needed.     Marland Kitchen oxyCODONE (ROXICODONE) 15 MG immediate release tablet Take 1 tablet (15 mg total) by mouth 2 (two) times daily as needed for pain. 60 tablet 0  . pseudoephedrine (SUDAFED) 120 MG 12 hr tablet Take 1 tablet (120 mg total) by mouth 2 (two) times daily as needed for congestion. 30 tablet 1  . ranitidine (ZANTAC) 150 MG tablet Take 1 tablet (150 mg total) by mouth 2 (two) times daily. 60 tablet 3  . sucralfate (CARAFATE) 1 g tablet TAKE 1 TABLET BY MOUTH 3 TIMES A DAY BETWEEN MEALS 90 tablet 2  . SUMAtriptan (IMITREX) 100 MG tablet Take 1 tablet (100 mg total) by mouth every 2 (two) hours as needed for migraine. May repeat in 2 hours if headache persists or recurs. 12 tablet 5  . telmisartan-hydrochlorothiazide (MICARDIS HCT) 80-25 MG tablet TAKE 1 TABLET BY MOUTH EVERY DAY 90 tablet 2  . tiZANidine (ZANAFLEX) 4 MG tablet Take 1 tablet (4 mg total) by mouth every 8 (eight) hours as needed for muscle spasms. 60 tablet 1  . zolmitriptan  (ZOMIG) 5 MG nasal solution Place 1 spray into the nose as needed for migraine. 6 Units 5   Facility-Administered Medications Prior to Visit  Medication Dose Route Frequency Provider Last Rate Last Dose  . 0.9 %  sodium chloride infusion  500 mL Intravenous Continuous Armbruster, Renelda Loma, MD        ROS Review of Systems  Constitutional: Negative for activity change, appetite change, chills, fatigue and unexpected weight change.  HENT: Negative for congestion, mouth sores and sinus pressure.   Eyes: Negative for visual disturbance.  Respiratory: Negative for cough and chest tightness.   Gastrointestinal: Positive for abdominal pain. Negative for abdominal distention, blood in stool, constipation and nausea.  Genitourinary: Negative for difficulty urinating, frequency and vaginal pain.  Musculoskeletal: Positive for arthralgias and neck pain. Negative for back pain and gait problem.  Skin: Negative for pallor and rash.  Neurological: Negative for dizziness, tremors, weakness, numbness and headaches.  Psychiatric/Behavioral: Negative for confusion and sleep disturbance.    Objective:  BP 132/86 (BP Location: Left Arm, Patient Position: Sitting, Cuff Size: Normal)   Pulse 81   Temp 98.7 F (37.1 C) (Oral)   Ht 5\' 4"  (1.626 m)   Wt 166 lb (75.3 kg)   SpO2 97%   BMI 28.49 kg/m  BP Readings from Last 3 Encounters:  01/19/17 132/86  12/23/16 (!) 144/88  10/27/16 (!) 142/82    Wt Readings from Last 3 Encounters:  01/19/17 166 lb (75.3 kg)  12/23/16 167 lb (75.8 kg)  10/27/16 169 lb 12 oz (77 kg)    Physical Exam  Constitutional: She appears well-developed. No distress.  HENT:  Head: Normocephalic.  Right Ear: External ear normal.  Left Ear: External ear normal.  Nose: Nose normal.  Mouth/Throat: Oropharynx is clear and moist.  Eyes: Conjunctivae are normal. Pupils are equal, round, and reactive to light. Right eye exhibits no discharge. Left eye exhibits no discharge.    Neck: Normal range of motion. Neck supple. No JVD present. No tracheal deviation present. No thyromegaly present.  Cardiovascular: Normal rate, regular rhythm and normal heart sounds.   Pulmonary/Chest: No stridor. No respiratory distress. She has no wheezes.  Abdominal: Soft. Bowel sounds are normal. She exhibits no distension and no mass. There is no tenderness. There is no rebound and no guarding.  Musculoskeletal: She exhibits tenderness. She exhibits no edema.  Lymphadenopathy:    She has no cervical adenopathy.  Neurological: She displays normal reflexes. No cranial nerve deficit. She exhibits normal muscle tone. Coordination normal.  Skin: No rash noted. No erythema.  Psychiatric: She has a normal mood and affect. Her behavior is normal. Judgment and thought content normal.  abd sensitive above umbilicus Small umbil hernia R shoulder - painful w/ROM  Lab Results  Component Value Date   WBC 7.8 08/27/2016   HGB 14.1 08/27/2016   HCT 41.2 08/27/2016   PLT 406.0 (H) 08/27/2016   GLUCOSE 106 (H) 08/27/2016   CHOL 171 10/16/2015   TRIG 119.0 10/16/2015   HDL 42.20 10/16/2015   LDLCALC 105 (H) 10/16/2015   ALT 21 08/27/2016   AST 17 08/27/2016   NA 139 08/27/2016   K 3.5 08/27/2016   CL 100 08/27/2016   CREATININE 0.78 08/27/2016   BUN 10 08/27/2016   CO2 31 08/27/2016   TSH 0.28 (L) 08/27/2016    Ct Abdomen Pelvis Wo Contrast  Result Date: 09/09/2016 CLINICAL DATA:  Right lower quadrant tenderness over the last 6 months. EXAM: CT ABDOMEN AND PELVIS WITHOUT CONTRAST TECHNIQUE: Multidetector CT imaging of the abdomen and pelvis was performed following the standard protocol without IV contrast. COMPARISON:  Abdominal ultrasound 03/04/2016 FINDINGS: Lower chest: Mild subsegmental atelectasis or scarring in the right middle lobe. Otherwise unremarkable where included. Hepatobiliary: Minimal hepatic steatosis.  Gallbladder unremarkable. Pancreas: Unremarkable Spleen: Unremarkable  Adrenals/Urinary Tract: Unremarkable Stomach/Bowel: Prominent stool throughout the colon favors constipation. The distal appendix is immediately adjacent to the right ovary/adnexa and accordingly tip is obscured, but the proximal appendix is well seen and appears normal. Vascular/Lymphatic: Unremarkable Reproductive: Unremarkable Other: No supplemental non-categorized findings. Musculoskeletal: Small bilateral indirect inguinal hernias contain adipose tissue. Small umbilical hernia contains adipose tissue. No lumbar impingement. IMPRESSION: 1.  Prominent stool throughout the colon favors constipation. 2. Minimal hepatic steatosis. 3. Small bilateral indirect inguinal hernias and small umbilical hernia containing adipose tissue. 4. No renal calculi identified. 5. The proximal and mid appendix appear normal, the appendiceal tip is obscured by the adjacent ovary/adnexa. Electronically Signed   By: Van Clines M.D.   On: 09/09/2016 13:27    Assessment & Plan:   There are no diagnoses linked to this encounter. I have discontinued Ms. Failla's zolmitriptan. I am also having her maintain her pseudoephedrine, Vitamin D3, Vitamin B-12, fluticasone, omeprazole, tiZANidine, amLODipine, sucralfate, ranitidine,  telmisartan-hydrochlorothiazide, Diclofenac Sodium, SUMAtriptan, morphine, and oxyCODONE. We will continue to administer sodium chloride.  No orders of the defined types were placed in this encounter.    Follow-up: No Follow-up on file.  Walker Kehr, MD

## 2017-01-19 NOTE — Assessment & Plan Note (Signed)
abd sensitive above umbilicus Small umbil hernia ?etiol CT reviewed Not constipated F/u w/GI

## 2017-01-19 NOTE — Patient Instructions (Signed)
Shingrix

## 2017-01-19 NOTE — Assessment & Plan Note (Signed)
We discussed age appropriate health related issues, including available/recomended screening tests and vaccinations. We discussed a need for adhering to healthy diet and exercise. Labs were ordered to be later reviewed . All questions were answered.   

## 2017-01-21 ENCOUNTER — Encounter: Payer: Self-pay | Admitting: Gastroenterology

## 2017-01-21 ENCOUNTER — Ambulatory Visit (INDEPENDENT_AMBULATORY_CARE_PROVIDER_SITE_OTHER): Payer: Federal, State, Local not specified - PPO | Admitting: Gastroenterology

## 2017-01-21 ENCOUNTER — Ambulatory Visit (INDEPENDENT_AMBULATORY_CARE_PROVIDER_SITE_OTHER): Payer: Federal, State, Local not specified - PPO | Admitting: Gynecology

## 2017-01-21 ENCOUNTER — Encounter: Payer: Self-pay | Admitting: Gynecology

## 2017-01-21 ENCOUNTER — Other Ambulatory Visit: Payer: Self-pay | Admitting: Gynecology

## 2017-01-21 VITALS — BP 118/76 | Ht 64.0 in | Wt 166.0 lb

## 2017-01-21 VITALS — BP 142/84 | HR 80 | Ht 63.5 in | Wt 165.4 lb

## 2017-01-21 DIAGNOSIS — Z1151 Encounter for screening for human papillomavirus (HPV): Secondary | ICD-10-CM

## 2017-01-21 DIAGNOSIS — R1012 Left upper quadrant pain: Secondary | ICD-10-CM | POA: Diagnosis not present

## 2017-01-21 DIAGNOSIS — K2940 Chronic atrophic gastritis without bleeding: Secondary | ICD-10-CM

## 2017-01-21 DIAGNOSIS — R87810 Cervical high risk human papillomavirus (HPV) DNA test positive: Secondary | ICD-10-CM | POA: Diagnosis not present

## 2017-01-21 DIAGNOSIS — K219 Gastro-esophageal reflux disease without esophagitis: Secondary | ICD-10-CM

## 2017-01-21 DIAGNOSIS — R1032 Left lower quadrant pain: Secondary | ICD-10-CM

## 2017-01-21 DIAGNOSIS — R21 Rash and other nonspecific skin eruption: Secondary | ICD-10-CM | POA: Diagnosis not present

## 2017-01-21 DIAGNOSIS — R1011 Right upper quadrant pain: Secondary | ICD-10-CM | POA: Diagnosis not present

## 2017-01-21 DIAGNOSIS — Z01411 Encounter for gynecological examination (general) (routine) with abnormal findings: Secondary | ICD-10-CM

## 2017-01-21 DIAGNOSIS — N952 Postmenopausal atrophic vaginitis: Secondary | ICD-10-CM | POA: Diagnosis not present

## 2017-01-21 DIAGNOSIS — R3911 Hesitancy of micturition: Secondary | ICD-10-CM | POA: Diagnosis not present

## 2017-01-21 DIAGNOSIS — R2232 Localized swelling, mass and lump, left upper limb: Secondary | ICD-10-CM | POA: Diagnosis not present

## 2017-01-21 DIAGNOSIS — G8929 Other chronic pain: Secondary | ICD-10-CM | POA: Diagnosis not present

## 2017-01-21 DIAGNOSIS — K760 Fatty (change of) liver, not elsewhere classified: Secondary | ICD-10-CM

## 2017-01-21 MED ORDER — DICYCLOMINE HCL 10 MG PO CAPS: 10.0000 mg | ORAL_CAPSULE | Freq: Three times a day (TID) | ORAL | 3 refills | Status: DC | PRN

## 2017-01-21 MED ORDER — VALACYCLOVIR HCL 500 MG PO TABS: 500.0000 mg | ORAL_TABLET | Freq: Two times a day (BID) | ORAL | 1 refills | Status: DC

## 2017-01-21 MED ORDER — VSL#3 DS PO PACK: 112.5000 | PACK | Freq: Two times a day (BID) | ORAL | 3 refills | Status: DC

## 2017-01-21 NOTE — Patient Instructions (Addendum)
Mammography facility should call you to schedule the mammogram and ultrasound of the arm pit.  Urology office should call you to schedule an appointment.  If you do not hear from either of these offices call my office.

## 2017-01-21 NOTE — Patient Instructions (Signed)
If you are age 60 or older, your body mass index should be between 23-30. Your Body mass index is 28.84 kg/m. If this is out of the aforementioned range listed, please consider follow up with your Primary Care Provider.  If you are age 77 or younger, your body mass index should be between 19-25. Your Body mass index is 28.84 kg/m. If this is out of the aformentioned range listed, please consider follow up with your Primary Care Provider.   We have sent the following medications to your pharmacy for you to pick up at your convenience:  Bentyl  A written prescription has been printed for VSL Capsules. The best pharmacy to have this filled is [facility]. Their number is [phone] on [address]  You have been given samples of VSL #3 112.5billion per capsules today.  You have been given a Low FodMap Diet to follow.  Please continue your Omeprazole as instructed.  Please discontinue Zantac and Carafate.  Thank you.

## 2017-01-21 NOTE — Progress Notes (Signed)
Karina Newman [birth year] [id number]        61 y.o.  G5P0014 for annual exam.  Patient also has numerous other issues to include:  1. Episodic outbreaks of a vesicular then crusting rash upper crease of her buttocks occurs 4-5 times per year. Has a prodromal tingling before the outbreak. 2. Left lower quadrant pain over the last numerous months occur several times weekly when she changes position she'll feel a sharp pulling to sharp stabbing pain that lasts for several minutes and then resolves. No nausea vomiting diarrhea constipation. Had CT scan of the abdomen/pelvis in January with reportedly normal reproductive organs and pelvis. History of prior cesarean sections 3. Mass in her left axilla that has been present for years and she reports having an MRI done before moving to [facility] where they reported a "solid area" with no further workup done. She feels that it slowly getting bigger. It is not uncomfortable. No palpable abnormalities in her left breast on self breast exam. 4. Hesitancy with urination. She'll note starting and stopping his stream despite position changes. Takes several minutes to empty her bladder. Has been going on over the last year. No frequency dysuria low back pain fever or chills. 5. History of abnormal Pap smears off and on over the past number of years. Most recently 2015 had a Pap smear showing normal cytology positive high-risk HPV was negative subtype 18/45. Colposcopy was inadequate but no abnormality seen. ECC was negative. Was to follow up for Pap smear in one year but failed to do so.  Past medical history,surgical history, problem list, medications, allergies, family history and social history were all reviewed and documented as reviewed in the EPIC chart.  ROS:  Performed with pertinent positives and negatives included in the history, assessment and plan.   Additional significant findings :  None   Exam: Caryn Bee assistant Vitals:   01/21/17 1123    BP: 118/76  Weight: 166 lb (75.3 kg)  Height: 5\' 4"  (1.626 m)   Body mass index is 28.49 kg/m.  General appearance:  Normal affect, orientation and appearance. Skin: Grossly normal HEENT: Without gross lesions.  No cervical or supraclavicular adenopathy. Thyroid normal.  Lungs:  Clear without wheezing, rales or rhonchi Cardiac: RR, without RMG Abdominal:  Soft, nontender, without masses, guarding, rebound, organomegaly or hernia Breasts:  Examined lying and sitting without masses, retractions, discharge or axillary adenopathy.  2 cm lipomatous feeling mass mid outer axillary line outside the true axilla palpated. Pelvic:  Ext, BUS, Vagina: With atrophic changes  Cervix: With atrophic changes. Pap smear/HPV  Uterus: Anteverted, normal size, shape and contour, midline and mobile nontender   Adnexa: Without masses or tenderness    Anus and perineum: Normal   Rectovaginal: Normal sphincter tone without palpated masses or tenderness.    Assessment/Plan:  61 y.o. G5P0014 female for annual exam.   1. Postmenopausal/atrophic genital changes. No significant hot flushes, night sweats, vaginal dryness or any vaginal bleeding. Continue to monitor and report any issues or bleeding. 2. Crusting rash upper gluteal fold consistent with viral. Discussed differential to include HSV or zoster. Has prodromal symptoms with vesicles then breakdown with crusting rash. Recommended Valtrex 500 mg twice a day at earliest onset of symptoms 5 days. #30 with 1 refill given.  If continues to be an issue represent when rash is occurring. 3. Urinary hesitancy. Exam shows no significant anatomic changes or palpable abnormalities. No significant urgency or UTI symptoms such as dysuria frequency low back  pain fever or chills. Will check baseline urinalysis today. Offered and accepted referral to urology and will help her make this arrangement. She knows to call us if she does not hear from their office within 2  weeks. 4. Chronic left lower quadrant pain. CT scan this year was negative for pelvic pathology. It sounds like this may be adhesion related to her C-sections as it is a pulling sensation when she shifts position transiently lasting minutes several times weekly. Options to include other studies such as ultrasound or referral to GI discussed. She had a colonoscopy within the past year which was negative. At this point the patient's comfortable waiting and just monitoring for now. If it would worsen or change she'll follow up for further evaluation. 5. Mass in her left axilla along the left mid outer axillary line outside the true axilla consistent with lipoma. Last mammogram 2012. Recommend baseline mammogram now with ultrasound over this area and then triage based upon these results. Patient knows to call my office if she does not hear from the mammogram facility to schedule within the next 2 weeks. 6. History of abnormal Pap smears on and off over the last number of years. Most recently 2015 with normal cytology positive high-risk HPV positive subtype 18/45. Colposcopy was inadequate without abnormalities seen. ECC was negative. Pap smear/HPV done today. Triage based upon results. 7. Colonoscopy 2017. Repeat at their recommended interval. 8. Health maintenance. No routine lab work done as patient reports this done elsewhere. Follow up for her mammogram/ultrasound of the axilla & urology appointment. Follow up in one year, sooner as needed.  Additional time in excess of her routine gynecologic exam was spent in direct face to face counseling and coordination of care in regards to her buttocks rash, urinary hesitancy, chronic left lower quadrant pain &  mass in the left axilla.    Anastasio Auerbach MD, 12:01 PM 01/21/2017

## 2017-01-21 NOTE — Progress Notes (Signed)
HPI :  61 y/o female here for a follow up visit. She has been previously seen for abdominal pain and colon cancer screening. Her pains have changed over time leading to a variety of testing.  Workup as follows: CT scan abdomen for RLQ pain - 09/09/16, oral contrast only - hepatic steatosis, small inguinal hernias, small umbilical hernia EGD 01/19/29 - normal EGD, biopsies taken for H pylori - showing "marked chronic gastritis" , negative for HP Colonoscopy 05/06/16 - normal colon and ileum, small adenoma, hemorrhoids. Recall colonoscopy in 5 years. [place] 03/04/16 - no gallstones, fatty liver  Labs show negative IF AB, positive parietal cell AB, and B12 deficiency. Diagnosed with autoimmune gastritis.  Patient states she had been doing well without much discomfort and then reports developing diffuse upper abdominal discomfort in May which has persisted over time. It is located in her upper abdomen diffusely. It is present all the time. Eating does not make it worse bu she can feel full easily. She has a heaviness in her stomach after she eats. She has gas / bloating which bothers her and she feels distended. She has not had any vomiting. She thinks weight is stable. She thinks this is different from symptoms she has had before. She takes chronic morphine 30mg  BID and oxycodone IR 15mg  PRN for chronic shoulder pain. She is trying to wean herself off the morphine. She denies use of NSAIDs routinely. She denies any heartburn on present regimen. She has had regular bowel movements in general. Having a bowel movement does not make her feel any better.   She denies alcohol use currently and she denies any heavy alcohol use in the past.      Past Medical History:  Diagnosis Date  . Abnormal cervical Pap smear with positive HPV DNA test 01/2014   Normal cytology with positive high-risk HPV 18/45. Colposcopy negative with negative ECC  . Anemia   . Autoimmune gastritis   . Fatty liver   . Gastritis   .  Heart murmur   . Hypertension   . Hyperthyroidism   . Internal hemorrhoids   . Right shoulder pain 2001   chronic pain  . Thyroid disease    Hyperthyroid. Was on PTU (Dr Hampton Abbot in [place]) - stopped in 2013, stable  . Tubular adenoma of colon      Past Surgical History:  Procedure Laterality Date  . CESAREAN SECTION     x 4   . HAND SURGERY Left   . SHOULDER SURGERY Right 2002  . SHOULDER SURGERY Left 2014  . TUBAL LIGATION     Family History  Problem Relation Age of Onset  . Kidney disease Mother        ESRD  . Hypertension Mother   . Cancer Father        lung ca  . Hypertension Sister   . Diabetes Brother   . Diabetes Paternal Grandmother   . Colon cancer Neg Hx    Social History  Substance Use Topics  . Smoking status: Never Smoker  . Smokeless tobacco: Never Used  . Alcohol use Yes     Comment: social   Current Outpatient Prescriptions  Medication Sig Dispense Refill  . amLODipine (NORVASC) 5 MG tablet Take 1 tablet (5 mg total) by mouth daily. 90 tablet 3  . Cholecalciferol (VITAMIN D3) 2000 units capsule Take 1 capsule (2,000 Units total) by mouth daily. 100 capsule 3  . Cyanocobalamin (VITAMIN B-12) 500 MCG SUBL 1 sl qd  100 tablet 5  . Diclofenac Sodium (PENNSAID) 1.5 % SOLN Place 0.3 mLs onto the skin 4 (four) times daily as needed. 150 mL 6  . fluticasone (FLONASE) 50 MCG/ACT nasal spray Place 1 spray into both nostrils as needed.     Marland Kitchen morphine (MS CONTIN) 30 MG 12 hr tablet Take 1 tablet (30 mg total) by mouth every 12 (twelve) hours. 60 tablet 0  . omeprazole (PRILOSEC) 40 MG capsule Take 1 capsule by mouth as needed.     Marland Kitchen oxyCODONE (ROXICODONE) 15 MG immediate release tablet Take 1 tablet (15 mg total) by mouth 2 (two) times daily as needed for pain. 60 tablet 0  . pseudoephedrine (SUDAFED) 120 MG 12 hr tablet Take 1 tablet (120 mg total) by mouth 2 (two) times daily as needed for congestion. 30 tablet 1  . ranitidine (ZANTAC) 150 MG tablet Take 1 tablet (150  mg total) by mouth 2 (two) times daily. 60 tablet 3  . sucralfate (CARAFATE) 1 g tablet TAKE 1 TABLET BY MOUTH 3 TIMES A DAY BETWEEN MEALS 90 tablet 2  . SUMAtriptan (IMITREX) 100 MG tablet Take 1 tablet (100 mg total) by mouth every 2 (two) hours as needed for migraine. May repeat in 2 hours if headache persists or recurs. 12 tablet 5  . telmisartan-hydrochlorothiazide (MICARDIS HCT) 80-25 MG tablet TAKE 1 TABLET BY MOUTH EVERY DAY 90 tablet 2  . tiZANidine (ZANAFLEX) 4 MG tablet Take 1 tablet (4 mg total) by mouth every 8 (eight) hours as needed for muscle spasms. 60 tablet 1   Current Facility-Administered Medications  Medication Dose Route Frequency Provider Last Rate Last Dose  . 0.9 %  sodium chloride infusion  500 mL Intravenous Continuous Kyel Purk, Renelda Loma, MD       Allergies  Allergen Reactions  . Penicillins Hives  . Gabapentin     Nausea   . Iodine Other (See Comments)    Due to hyperthyroidism  . Lyrica [Pregabalin] Dermatitis  . Nsaids     gastritis  . Voltaren [Diclofenac] Hives     Review of Systems: All systems reviewed and negative except where noted in HPI.   Lab Results  Component Value Date   WBC 5.0 01/19/2017   HGB 13.4 01/19/2017   HCT 38.9 01/19/2017   MCV 92.9 01/19/2017   PLT 329.0 01/19/2017    Lab Results  Component Value Date   ALT 25 01/19/2017   AST 18 01/19/2017   ALKPHOS 81 01/19/2017   BILITOT 0.5 01/19/2017    Lab Results  Component Value Date   CREATININE 0.87 01/19/2017   BUN 10 01/19/2017   NA 140 01/19/2017   K 3.9 01/19/2017   CL 102 01/19/2017   CO2 31 01/19/2017     Physical Exam: BP (!) 142/84 (BP Location: Left Arm, Patient Position: Sitting, Cuff Size: Normal)   Pulse 80   Ht 5' 3.5" (1.613 m) Comment: height measured without shoes  Wt 165 lb 6 oz (75 kg)   BMI 28.84 kg/m  Constitutional: Pleasant,well-developed, female in no acute distress. HEENT: Normocephalic and atraumatic. Conjunctivae are normal.  No scleral icterus. Neck supple.  Cardiovascular: Normal rate, regular rhythm.  Pulmonary/chest: Effort normal and breath sounds normal. No wheezing, rales or rhonchi. Abdominal: Soft, nondistended, mild epigastric tenderness to palpation.  There are no masses palpable. No hepatomegaly. Extremities: no edema Lymphadenopathy: No cervical adenopathy noted. Neurological: Alert and oriented to person place and time. Skin: Skin is warm and dry. No rashes  noted. Psychiatric: Normal mood and affect. Behavior is normal.   ASSESSMENT AND PLAN: 61 year old female here for reassessment of the following issues:  Upper abdominal discomfort / bloating - reassured patient of prior negative CT scan findings other than hepatic steatosis. Relatively recent EGD with autoimmune gastritis however I don't think this is causing her present symptoms. Perhaps she has dyspepsia, with worsening intestinal gas, perhaps made worse by chronic narcotic use. Recommend she wean off narcotics if she can, she is trying to do this. For her bloating predominant symptoms we'll try her on a course of VSL #3 twice a day, as well as a low FODMAP diet. We will also give her some Bentyl to use as needed. If no improvement I asked her to call me back in a few weeks.  Autoimmune gastritis / pernicious anemia - chronic gastritis noted on her last 2 endoscopies. She has history of B12 deficiency. She has a positive antiparietal cell antibody. She has had 2 prior endoscopies without any concerning lesions in the stomach. Guidelines do recommend EGD at the time of diagnosis, however there are no guideline recommendations for long-term surveillance for this. May consider a follow up EGD 2-3 years after her last exam.   GERD - well controlled on current regimen. She can stop Zantac at this time and stop Carafate, continue with omeprazole monotherapy and she how she does.   Fatty liver - counseled her on spectrum of fatty liver disease and long  term potential risk of NASH / cirrhosis, she is not drinking alcohol. Her LFTs are normal and reassuring. Recommend routine exercise, liberal coffee intake, repeat LFTs within a year.   [place] Cellar, MD [facility] Pager [phone]

## 2017-01-21 NOTE — Addendum Note (Signed)
Addended by: Campbell Stall on: 01/21/2017 12:38 PM   Modules accepted: Orders

## 2017-01-22 LAB — URINALYSIS W MICROSCOPIC + REFLEX CULTURE
Bacteria, UA: NONE SEEN [HPF]
Bilirubin Urine: NEGATIVE
CASTS: NONE SEEN [LPF]
Glucose, UA: NEGATIVE
Hgb urine dipstick: NEGATIVE
Leukocytes, UA: NEGATIVE
Nitrite: NEGATIVE
RBC / HPF: NONE SEEN RBC/HPF (ref <=2)
Specific Gravity, Urine: 1.026 (ref 1.001–1.035)
WBC, UA: NONE SEEN WBC/HPF (ref <=5)
Yeast: NONE SEEN [HPF]
pH: 6 (ref 5.0–8.0)

## 2017-01-25 ENCOUNTER — Telehealth: Payer: Self-pay | Admitting: *Deleted

## 2017-01-25 DIAGNOSIS — N63 Unspecified lump in unspecified breast: Secondary | ICD-10-CM

## 2017-01-25 LAB — PAP IG AND HPV HIGH-RISK: HPV DNA HIGH RISK: DETECTED — AB

## 2017-01-25 NOTE — Telephone Encounter (Signed)
-----   Message from Anastasio Auerbach, MD sent at 01/21/2017 12:06 PM EDT ----- Patient needs:  #1 baseline mammography and ultrasound of left axilla. 2 cm lipomatous mass mid outer axillary line #2 urology appointment reference urinary hesitancy

## 2017-01-25 NOTE — Telephone Encounter (Signed)
1. I called pt and explained to her that breast center is going to require films prior to appointment. Pt had mammogram done in [place] will contact them to get films/disc to bring to breast center. Pt asked me not to schedule yet and she will call me back once she has heard from imaging facility  in [place].   2. Notes faxed to [facility] patient prefers them to call her to schedule,I placed this note on referral form.

## 2017-01-29 ENCOUNTER — Ambulatory Visit
Admission: RE | Admit: 2017-01-29 | Discharge: 2017-01-29 | Disposition: A | Discharge status: Home / Self Care | Payer: Federal, State, Local not specified - PPO | Source: Ambulatory Visit | Attending: Orthopedic Surgery | Admitting: Orthopedic Surgery

## 2017-01-29 DIAGNOSIS — M25551 Pain in right hip: Secondary | ICD-10-CM

## 2017-01-31 ENCOUNTER — Encounter: Payer: Self-pay | Admitting: Gynecology

## 2017-01-31 LAB — HPV TYPE 16 AND 18/45 RNA
HPV TYPE 16 RNA: NOT DETECTED
HPV TYPE 18/45 RNA: DETECTED — AB

## 2017-02-01 NOTE — Telephone Encounter (Signed)
1. Pt scheduled at breast center on 02/08/17 @ 9:00am, pt aware.

## 2017-02-03 NOTE — Telephone Encounter (Signed)
2. Pt scheduled on 03/18/17 @ 1:15pm with Dr.MacDiarmid

## 2017-02-08 ENCOUNTER — Ambulatory Visit
Admission: RE | Admit: 2017-02-08 | Discharge: 2017-02-08 | Disposition: A | Discharge status: Home / Self Care | Payer: Federal, State, Local not specified - PPO | Source: Ambulatory Visit | Attending: Gynecology | Admitting: Gynecology

## 2017-02-08 DIAGNOSIS — N63 Unspecified lump in unspecified breast: Secondary | ICD-10-CM

## 2017-02-09 ENCOUNTER — Ambulatory Visit (INDEPENDENT_AMBULATORY_CARE_PROVIDER_SITE_OTHER): Payer: Federal, State, Local not specified - PPO | Admitting: Orthopedic Surgery

## 2017-02-09 ENCOUNTER — Ambulatory Visit: Payer: Federal, State, Local not specified - PPO | Admitting: Gynecology

## 2017-02-09 ENCOUNTER — Encounter (INDEPENDENT_AMBULATORY_CARE_PROVIDER_SITE_OTHER): Payer: Self-pay | Admitting: Orthopedic Surgery

## 2017-02-09 DIAGNOSIS — M5416 Radiculopathy, lumbar region: Secondary | ICD-10-CM | POA: Diagnosis not present

## 2017-02-09 DIAGNOSIS — M25551 Pain in right hip: Secondary | ICD-10-CM | POA: Diagnosis not present

## 2017-02-10 NOTE — Progress Notes (Signed)
Office Visit Note   Patient: Karina Newman           Date of Birth: Dec 19, 1955           MRN: [id number] Visit Date: 02/09/2017 Requested by: Cassandria Anger, MD [place], [place] [address] PCP: Cassandria Anger, MD  Subjective: Chief Complaint  Patient presents with  . Right Leg - Pain    HPI: Karina Newman is a 61 year old patient with right thigh and leg pain.  She injured herself after a fall in January 2017.  She's had pain since the fall.  Pain is constant sometimes better sometimes worse.  The pain does wake her from sleep at night.  She reports radicular type leg pain but no numbness and tingling.  She does have some low back pain on the right-hand side.  Since of [facility] she's had an MRI of the pelvis.  She does have small bilateral inguinal hernias but otherwise pelvic MRI is normal in terms of bone or muscle abnormality.  It is hard for her to sit.  She has right-sided symptoms only.  No trochanteric bursitis noted on the MRI scan.              ROS: All systems reviewed are negative as they relate to the chief complaint within the history of present illness.  Patient denies  fevers or chills.   Assessment & Plan: Visit Diagnoses:  1. Pain of right hip joint   2. Radiculopathy, lumbar region     Plan: Impression is essentially normal hip MRI scan and pelvis MRI scan.  She does have small bilateral inguinal hernias.  No AVN or hip arthritis or trochanteric bursitis.  I think this has to represent some form of radiculopathy.  Inguinal hernias could be symptomatic but I would expect them both to be symptomatic and not just the right.  Plan is for MRI L-spine to evaluate right-sided radiculopathy without numbness or tingling.  I'll see her back after that study  Follow-Up Instructions: Return for after MRI.   Orders:  Orders Placed This Encounter  Procedures  . MR Lumbar Spine w/o contrast   No orders of the defined types were placed in this encounter.     Procedures: No procedures performed   Clinical Data: No additional findings.  Objective: Vital Signs: There were no vitals taken for this visit.  Physical Exam:   Constitutional: Patient appears well-developed HEENT:  Head: Normocephalic Eyes:EOM are normal Neck: Normal range of motion Cardiovascular: Normal rate Pulmonary/chest: Effort normal Neurologic: Patient is alert Skin: Skin is warm Psychiatric: Patient has normal mood and affect    Ortho Exam: Orthopedic exam demonstrates pretty normal gait alignment slightly antalgic to the right.  She has mild groin pain with internal/external rotation of the right leg but not the left.  Pedal pulses palpable.  Hip flexion abduction and adduction strength is intact.  No other masses lymph adenopathy or skin changes noted in the right thigh region.  Nerve's root tension signs equivocal for the sciatic and femoral nerve.  This is on the right-hand side.  No rashes present in the back or right leg region.  Specialty Comments:  No specialty comments available.  Imaging: No results found.   PMFS History: Patient Active Problem List   Diagnosis Date Noted  . Well adult exam 01/19/2017  . Abdominal pain 01/19/2017  . Headache 10/27/2016  . Acute pain of left shoulder 09/16/2016  . Left hip pain 09/16/2016  . Low  vitamin B12 level 03/16/2016  . Piriformis syndrome of right side 02/04/2016  . IT band syndrome 02/04/2016  . RUQ abdominal pain 02/04/2016  . Low back pain radiating to right lower extremity 09/19/2015  . Allergic rhinitis 09/19/2015  . Chest pain, atypical 04/07/2015  . GERD (gastroesophageal reflux disease) 04/07/2015  . Axillary adenitis 03/23/2015  . Migraine headache 03/23/2015  . Hives 01/29/2015  . Edema 01/29/2015  . Cervical disc disorder with radiculopathy of cervical region 12/11/2014  . Insomnia 07/31/2014  . Degenerative cervical disc 05/08/2014  . Pain in joint, shoulder region 03/05/2014  .  Patellofemoral arthralgia of both knees 03/01/2014  . Bilateral shoulder pain 10/18/2013  . Sinusitis, bacterial 08/20/2013  . URI, acute 08/14/2013  . Hemoptysis 08/14/2013  . Otitis media of left ear 08/14/2013  . Labral tear of shoulder 07/03/2013  . Hyperthyroidism 07/03/2013  . HTN (hypertension), benign 07/03/2013  . LVH (left ventricular hypertrophy) due to hypertensive disease 07/03/2013  . Vitamin D deficiency 07/03/2013   Past Medical History:  Diagnosis Date  . Abnormal cervical Pap smear with positive HPV DNA test 01/2014,01/2017   2015 Normal cytology with positive high-risk HPV 18/45. Colposcopy negative with negative ECC.  2018 normal cytology with positive high-risk HPV 18/45  . Anemia   . Autoimmune gastritis   . Fatty liver   . Gastritis   . Heart murmur   . Hypertension   . Hyperthyroidism   . Internal hemorrhoids   . Right shoulder pain 2001   chronic pain  . Thyroid disease    Hyperthyroid. Was on PTU (Dr Hampton Abbot in [place]) - stopped in 2013, stable  . Tubular adenoma of colon     Family History  Problem Relation Age of Onset  . Kidney disease Mother        ESRD  . Hypertension Mother   . Cancer Father        lung ca  . Hypertension Sister   . Diabetes Brother   . Diabetes Paternal Grandmother   . Colon cancer Neg Hx     Past Surgical History:  Procedure Laterality Date  . BREAST CYST ASPIRATION Left 2012  . CESAREAN SECTION     x 4   . HAND SURGERY Left   . SHOULDER SURGERY Right 2002  . SHOULDER SURGERY Left 2014  . TUBAL LIGATION     Social History   Occupational History  . RETIRED    Social History Main Topics  . Smoking status: Never Smoker  . Smokeless tobacco: Never Used  . Alcohol use No  . Drug use: No  . Sexual activity: Yes    Birth control/ protection: Post-menopausal, Surgical     Comment: BTL-1st intercourse 16 yo-5 partners

## 2017-02-11 ENCOUNTER — Other Ambulatory Visit: Payer: Self-pay | Admitting: Gynecology

## 2017-02-11 DIAGNOSIS — N63 Unspecified lump in unspecified breast: Secondary | ICD-10-CM

## 2017-02-28 ENCOUNTER — Ambulatory Visit
Admission: RE | Admit: 2017-02-28 | Discharge: 2017-02-28 | Disposition: A | Discharge status: Home / Self Care | Payer: Federal, State, Local not specified - PPO | Source: Ambulatory Visit | Attending: Orthopedic Surgery | Admitting: Orthopedic Surgery

## 2017-02-28 DIAGNOSIS — M5416 Radiculopathy, lumbar region: Secondary | ICD-10-CM

## 2017-03-02 ENCOUNTER — Encounter: Payer: Self-pay | Admitting: Internal Medicine

## 2017-03-02 ENCOUNTER — Ambulatory Visit (INDEPENDENT_AMBULATORY_CARE_PROVIDER_SITE_OTHER): Payer: Federal, State, Local not specified - PPO | Admitting: Internal Medicine

## 2017-03-02 DIAGNOSIS — S43439S Superior glenoid labrum lesion of unspecified shoulder, sequela: Secondary | ICD-10-CM

## 2017-03-02 DIAGNOSIS — E538 Deficiency of other specified B group vitamins: Secondary | ICD-10-CM

## 2017-03-02 DIAGNOSIS — E559 Vitamin D deficiency, unspecified: Secondary | ICD-10-CM | POA: Diagnosis not present

## 2017-03-02 MED ORDER — OMEPRAZOLE 40 MG PO CPDR: 40.0000 mg | DELAYED_RELEASE_CAPSULE | Freq: Every day | ORAL | 3 refills | Status: DC

## 2017-03-02 NOTE — Assessment & Plan Note (Signed)
On B12 

## 2017-03-02 NOTE — Assessment & Plan Note (Signed)
R shoulder chronic pain/disability - disabled since Oct 2012

## 2017-03-02 NOTE — Progress Notes (Signed)
Subjective:  Patient ID: Karina Newman, female    DOB: 1955-10-07  Age: 61 y.o. MRN: [id number]  CC: No chief complaint on file.   HPI Karina Newman presents for R shoulder chronic pain/disability - disabled since Oct 2012; needs a letter  Outpatient Medications Prior to Visit  Medication Sig Dispense Refill  . amLODipine (NORVASC) 5 MG tablet Take 1 tablet (5 mg total) by mouth daily. 90 tablet 3  . Cholecalciferol (VITAMIN D3) 2000 units capsule Take 1 capsule (2,000 Units total) by mouth daily. 100 capsule 3  . Cyanocobalamin (VITAMIN B-12) 500 MCG SUBL 1 sl qd 100 tablet 5  . Diclofenac Sodium (PENNSAID) 1.5 % SOLN Place 0.3 mLs onto the skin 4 (four) times daily as needed. 150 mL 6  . dicyclomine (BENTYL) 10 MG capsule Take 1 capsule (10 mg total) by mouth every 8 (eight) hours as needed for spasms. 60 capsule 3  . fluticasone (FLONASE) 50 MCG/ACT nasal spray Place 1 spray into both nostrils as needed.     Marland Kitchen morphine (MS CONTIN) 30 MG 12 hr tablet Take 1 tablet (30 mg total) by mouth every 12 (twelve) hours. 60 tablet 0  . oxyCODONE (ROXICODONE) 15 MG immediate release tablet Take 1 tablet (15 mg total) by mouth 2 (two) times daily as needed for pain. 60 tablet 0  . Probiotic Product (VSL#3 DS) PACK Take 112.5 each by mouth 2 (two) times daily. 60 each 3  . pseudoephedrine (SUDAFED) 120 MG 12 hr tablet Take 1 tablet (120 mg total) by mouth 2 (two) times daily as needed for congestion. 30 tablet 1  . SUMAtriptan (IMITREX) 100 MG tablet Take 1 tablet (100 mg total) by mouth every 2 (two) hours as needed for migraine. May repeat in 2 hours if headache persists or recurs. 12 tablet 5  . telmisartan-hydrochlorothiazide (MICARDIS HCT) 80-25 MG tablet TAKE 1 TABLET BY MOUTH EVERY DAY 90 tablet 2  . tiZANidine (ZANAFLEX) 4 MG tablet Take 1 tablet (4 mg total) by mouth every 8 (eight) hours as needed for muscle spasms. 60 tablet 1  . valACYclovir (VALTREX) 500 MG tablet Take 1 tablet (500 mg  total) by mouth 2 (two) times daily. For 5 days with outbreak 30 tablet 1   Facility-Administered Medications Prior to Visit  Medication Dose Route Frequency Provider Last Rate Last Dose  . 0.9 %  sodium chloride infusion  500 mL Intravenous Continuous Armbruster, Renelda Loma, MD        ROS Review of Systems  Constitutional: Negative for activity change, appetite change, chills, fatigue and unexpected weight change.  HENT: Negative for congestion, mouth sores and sinus pressure.   Eyes: Negative for visual disturbance.  Respiratory: Negative for cough and chest tightness.   Gastrointestinal: Negative for abdominal pain and nausea.  Genitourinary: Negative for difficulty urinating, frequency and vaginal pain.  Musculoskeletal: Positive for arthralgias. Negative for back pain and gait problem.  Skin: Negative for pallor and rash.  Neurological: Negative for dizziness, tremors, weakness, numbness and headaches.  Psychiatric/Behavioral: Negative for confusion and sleep disturbance.    Objective:  BP 126/78 (BP Location: Right Arm, Patient Position: Sitting, Cuff Size: Normal)   Pulse 78   Temp 98.2 F (36.8 C) (Oral)   Ht 5\' 4"  (1.626 m)   Wt 167 lb (75.8 kg)   SpO2 98%   BMI 28.67 kg/m   BP Readings from Last 3 Encounters:  03/02/17 126/78  01/21/17 118/76  01/21/17 (!) 142/84    Wt  Readings from Last 3 Encounters:  03/02/17 167 lb (75.8 kg)  01/21/17 166 lb (75.3 kg)  01/21/17 165 lb 6 oz (75 kg)    Physical Exam  Constitutional: She appears well-developed. No distress.  HENT:  Head: Normocephalic.  Right Ear: External ear normal.  Left Ear: External ear normal.  Nose: Nose normal.  Mouth/Throat: Oropharynx is clear and moist.  Eyes: Pupils are equal, round, and reactive to light. Conjunctivae are normal. Right eye exhibits no discharge. Left eye exhibits no discharge.  Neck: Normal range of motion. Neck supple. No JVD present. No tracheal deviation present. No  thyromegaly present.  Cardiovascular: Normal rate, regular rhythm and normal heart sounds.   Pulmonary/Chest: No stridor. No respiratory distress. She has no wheezes.  Abdominal: Soft. Bowel sounds are normal. She exhibits no distension and no mass. There is no tenderness. There is no rebound and no guarding.  Musculoskeletal: She exhibits tenderness. She exhibits no edema.  Lymphadenopathy:    She has no cervical adenopathy.  Neurological: She displays normal reflexes. No cranial nerve deficit. She exhibits normal muscle tone. Coordination normal.  Skin: No rash noted. No erythema.  Psychiatric: She has a normal mood and affect. Her behavior is normal. Judgment and thought content normal.   R>>L shoulder pain w/ROM  I spent total of 25 minutes face-to-face with patient and greater than 50% was spent counseling and or coordinating care (letter)     Lab Results  Component Value Date   WBC 5.0 01/19/2017   HGB 13.4 01/19/2017   HCT 38.9 01/19/2017   PLT 329.0 01/19/2017   GLUCOSE 112 (H) 01/19/2017   CHOL 169 01/19/2017   TRIG 95.0 01/19/2017   HDL 44.70 01/19/2017   LDLCALC 105 (H) 01/19/2017   ALT 25 01/19/2017   AST 18 01/19/2017   NA 140 01/19/2017   K 3.9 01/19/2017   CL 102 01/19/2017   CREATININE 0.87 01/19/2017   BUN 10 01/19/2017   CO2 31 01/19/2017   TSH 0.57 01/19/2017    Mr Lumbar Spine W/o Contrast  Result Date: 02/28/2017 CLINICAL DATA:  Right hip and buttock pain radiating to the back of the leg EXAM: MRI LUMBAR SPINE WITHOUT CONTRAST TECHNIQUE: Multiplanar, multisequence MR imaging of the lumbar spine was performed. No intravenous contrast was administered. COMPARISON:  None. FINDINGS: Segmentation:  Standard. Alignment:  Physiologic. Vertebrae:  No fracture, evidence of discitis, or bone lesion. Conus medullaris: Extends to the T12-L1 level and appears normal. Paraspinal and other soft tissues: Negative. Disc levels: Disc spaces: Disc spaces are maintained.  T12-L1: No significant disc bulge. No evidence of neural foraminal stenosis. No central canal stenosis. L1-L2: No significant disc bulge. No evidence of neural foraminal stenosis. No central canal stenosis. L2-L3: No significant disc bulge. No evidence of neural foraminal stenosis. No central canal stenosis. Mild bilateral facet arthropathy. L3-L4: No significant disc bulge. No evidence of neural foraminal stenosis. No central canal stenosis. Mild bilateral facet arthropathy. L4-L5: No significant disc bulge. No evidence of neural foraminal stenosis. No central canal stenosis. L5-S1: No significant disc bulge. No evidence of neural foraminal stenosis. No central canal stenosis. IMPRESSION: 1. No significant lumbar spine disc protrusion, foraminal stenosis or central canal stenosis. Electronically Signed   By: Kathreen Devoid   On: 02/28/2017 09:55    Assessment & Plan:   There are no diagnoses linked to this encounter. I am having Ms. Weinhold maintain her pseudoephedrine, Vitamin D3, Vitamin B-12, fluticasone, tiZANidine, amLODipine, telmisartan-hydrochlorothiazide, Diclofenac Sodium, SUMAtriptan, morphine,  oxyCODONE, dicyclomine, VSL#3 DS, and valACYclovir. We will continue to administer sodium chloride.  No orders of the defined types were placed in this encounter.    Follow-up: No Follow-up on file.  Walker Kehr, MD

## 2017-03-02 NOTE — Assessment & Plan Note (Signed)
On Vit D 

## 2017-03-03 ENCOUNTER — Ambulatory Visit (INDEPENDENT_AMBULATORY_CARE_PROVIDER_SITE_OTHER): Payer: Federal, State, Local not specified - PPO | Admitting: Orthopedic Surgery

## 2017-03-03 DIAGNOSIS — M79604 Pain in right leg: Secondary | ICD-10-CM

## 2017-03-03 DIAGNOSIS — M545 Low back pain: Secondary | ICD-10-CM

## 2017-03-03 DIAGNOSIS — M898X5 Other specified disorders of bone, thigh: Secondary | ICD-10-CM

## 2017-03-04 ENCOUNTER — Encounter (INDEPENDENT_AMBULATORY_CARE_PROVIDER_SITE_OTHER): Payer: Self-pay | Admitting: Orthopedic Surgery

## 2017-03-04 ENCOUNTER — Ambulatory Visit
Admission: RE | Admit: 2017-03-04 | Discharge: 2017-03-04 | Disposition: A | Discharge status: Home / Self Care | Payer: Federal, State, Local not specified - PPO | Source: Ambulatory Visit | Attending: Gynecology | Admitting: Gynecology

## 2017-03-04 ENCOUNTER — Other Ambulatory Visit: Payer: Self-pay | Admitting: Gynecology

## 2017-03-04 DIAGNOSIS — R2232 Localized swelling, mass and lump, left upper limb: Secondary | ICD-10-CM

## 2017-03-04 DIAGNOSIS — N63 Unspecified lump in unspecified breast: Secondary | ICD-10-CM

## 2017-03-04 NOTE — Progress Notes (Signed)
Office Visit Note   Patient: Karina Newman           Date of Birth: 06/18/1956           MRN: [id number] Visit Date: 03/03/2017 Requested by: Cassandria Anger, MD [place], [place] [address] PCP: Cassandria Anger, MD  Subjective: Chief Complaint  Patient presents with  . Lower Back - Pain    HPI: [name] is a 61 year old patient with toothache pain in her right hip region.  She had a fall over a year ago in the bathtub.  Stairs are okay and there is no radiation down her leg but she does localize the pain below the gluteal crease on the right and extending around to below the trochanter on the right.  She did have bruising at the time.  She's had an MRI scan of the pelvis which was within normal limits.  Specifically no trochanteric bursitis no hip arthritis.  She's also had an MRI scan of the lumbar spine which is within normal limits.  She denies any leg weakness just reports achiness.  She cannot sleep or lay on the right-hand side              ROS: All systems reviewed are negative as they relate to the chief complaint within the history of present illness.  Patient denies  fevers or chills.   Assessment & Plan: Visit Diagnoses:  1. Low back pain radiating to right lower extremity   2. Pain in right femur     Plan: Impression is normal MRI scan of back and pelvis.  Her pain is a little bit lower today in the area below the gluteal crease extending around to the femur.  No masses palpable in this area.  Plan MRI scan femur that's normal which I anticipate it may be I'll have to refer her to rheumatologic physician for further evaluation.  Very unclear exactly what's going on at this time.  Unusual for soft tissue symptoms to persist beyond the year as they have.  There is no surgical problem in the back hip or pelvis at this time.  No real explanation for continued pain can be found in these regions.  She is having most of her symptoms however a little bit below this  region today.  We'll get the scan on the femur and affect normal consider trochanteric bursa injection even that MRI scan was not clearly definitive for this or bursitis in this region.  I'll see her back after that study she'll consider an injection which she is reluctant to consider this time.  If that study is normal refer to rheumatologist for further workup.  Follow-Up Instructions: Return for after MRI.   Orders:  Orders Placed This Encounter  Procedures  . MR [facility] RIGHT WO CONTRAST   No orders of the defined types were placed in this encounter.     Procedures: No procedures performed   Clinical Data: No additional findings.  Objective: Vital Signs: There were no vitals taken for this visit.  Physical Exam:   Constitutional: Patient appears well-developed HEENT:  Head: Normocephalic Eyes:EOM are normal Neck: Normal range of motion Cardiovascular: Normal rate Pulmonary/chest: Effort normal Neurologic: Patient is alert Skin: Skin is warm Psychiatric: Patient has normal mood and affect    Ortho Exam: Her DP exam demonstrates normal gait alignment no nerve retention signs no groin pain with internal rotation leg palpable pedal pulses symmetric reflexes negative clonus no real discrete trochanteric tenderness  and normal on the right or left.  This tumor tenderness is in that area below the gluteal crease on the right.  No masses palpable in this area.  Specialty Comments:  No specialty comments available.  Imaging: US Breast Ltd Uni Left Inc Axilla  Result Date: 03/04/2017 CLINICAL DATA:  Patient returns for bilateral breast ultrasound to accompany diagnostic mammogram performed 02/08/2017. Follow-up was recommended for bilateral calcifications and bilateral breast masses after diagnostic study in 2014. Patient also has a palpable abnormality in the left axilla. When compared with prior outside study from 10/30/2012, calcifications are stable. Patient returns to  evaluate bilateral sonographically detected breast masses and to evaluate a palpable left axillary mass. EXAM: ULTRASOUND OF THE BILATERAL BREAST COMPARISON:  02/08/2017, 10/30/2012 FINDINGS: On physical exam, I palpate thickening along the upper posterior fold of the left axilla, associated with tenderness on exam. The patient reports left shoulder issues. Targeted ultrasound is performed, showing focal isoechoic mass within the subcutaneous fat, possibly representing a focal lipoma measuring 3.5 x 0.7 x 3.6 cm and corresponding to the area of palpable abnormality. No suspicious mass is identified in the area of palpable concern. No suspicious lymph nodes are identified sonographically. Right breast: In the 1 o'clock location 4 cm from the nipple, a simple cyst is 4 mm. Left breast: In the 230 o'clock location of the left breast 4 cm from nipple, a small cyst is 0.3 x 0.2 x 0.2 cm. In the 330 o'clock location 5 cm from nipple, a small cyst is 0.3 x 0.1 x 0.3 cm. In the left axillary tail, at a lymph node with normal morphology is imaged. No suspicious adenopathy identified. IMPRESSION: 1. Bilateral simple cyst. 2. No suspicious abnormalities in the left axilla. Palpable abnormality may represent an upper axillary lipoma. No adenopathy. RECOMMENDATION: Screening mammogram in one year.(Code:SM-B-01Y) I have discussed the findings and recommendations with the patient. Results were also provided in writing at the conclusion of the visit. If applicable, a reminder letter will be sent to the patient regarding the next appointment. BI-RADS CATEGORY  2: Benign. Electronically Signed   By: Nolon Nations M.D.   On: 03/04/2017 10:03   US Breast Ltd Uni Right Inc Axilla  Result Date: 03/04/2017 CLINICAL DATA:  Patient returns for bilateral breast ultrasound to accompany diagnostic mammogram performed 02/08/2017. Follow-up was recommended for bilateral calcifications and bilateral breast masses after diagnostic study in  2014. Patient also has a palpable abnormality in the left axilla. When compared with prior outside study from 10/30/2012, calcifications are stable. Patient returns to evaluate bilateral sonographically detected breast masses and to evaluate a palpable left axillary mass. EXAM: ULTRASOUND OF THE BILATERAL BREAST COMPARISON:  02/08/2017, 10/30/2012 FINDINGS: On physical exam, I palpate thickening along the upper posterior fold of the left axilla, associated with tenderness on exam. The patient reports left shoulder issues. Targeted ultrasound is performed, showing focal isoechoic mass within the subcutaneous fat, possibly representing a focal lipoma measuring 3.5 x 0.7 x 3.6 cm and corresponding to the area of palpable abnormality. No suspicious mass is identified in the area of palpable concern. No suspicious lymph nodes are identified sonographically. Right breast: In the 1 o'clock location 4 cm from the nipple, a simple cyst is 4 mm. Left breast: In the 230 o'clock location of the left breast 4 cm from nipple, a small cyst is 0.3 x 0.2 x 0.2 cm. In the 330 o'clock location 5 cm from nipple, a small cyst is 0.3 x 0.1 x  0.3 cm. In the left axillary tail, at a lymph node with normal morphology is imaged. No suspicious adenopathy identified. IMPRESSION: 1. Bilateral simple cyst. 2. No suspicious abnormalities in the left axilla. Palpable abnormality may represent an upper axillary lipoma. No adenopathy. RECOMMENDATION: Screening mammogram in one year.(Code:SM-B-01Y) I have discussed the findings and recommendations with the patient. Results were also provided in writing at the conclusion of the visit. If applicable, a reminder letter will be sent to the patient regarding the next appointment. BI-RADS CATEGORY  2: Benign. Electronically Signed   By: Nolon Nations M.D.   On: 03/04/2017 10:03     PMFS History: Patient Active Problem List   Diagnosis Date Noted  . Well adult exam 01/19/2017  . Abdominal pain  01/19/2017  . Headache 10/27/2016  . Acute pain of left shoulder 09/16/2016  . Left hip pain 09/16/2016  . Low vitamin B12 level 03/16/2016  . Piriformis syndrome of right side 02/04/2016  . IT band syndrome 02/04/2016  . RUQ abdominal pain 02/04/2016  . Low back pain radiating to right lower extremity 09/19/2015  . Allergic rhinitis 09/19/2015  . Chest pain, atypical 04/07/2015  . GERD (gastroesophageal reflux disease) 04/07/2015  . Axillary adenitis 03/23/2015  . Migraine headache 03/23/2015  . Hives 01/29/2015  . Edema 01/29/2015  . Cervical disc disorder with radiculopathy of cervical region 12/11/2014  . Insomnia 07/31/2014  . Degenerative cervical disc 05/08/2014  . Pain in joint, shoulder region 03/05/2014  . Patellofemoral arthralgia of both knees 03/01/2014  . Bilateral shoulder pain 10/18/2013  . Sinusitis, bacterial 08/20/2013  . URI, acute 08/14/2013  . Hemoptysis 08/14/2013  . Otitis media of left ear 08/14/2013  . Labral tear of shoulder 07/03/2013  . Hyperthyroidism 07/03/2013  . HTN (hypertension), benign 07/03/2013  . LVH (left ventricular hypertrophy) due to hypertensive disease 07/03/2013  . Vitamin D deficiency 07/03/2013   Past Medical History:  Diagnosis Date  . Abnormal cervical Pap smear with positive HPV DNA test 01/2014,01/2017   2015 Normal cytology with positive high-risk HPV 18/45. Colposcopy negative with negative ECC.  2018 normal cytology with positive high-risk HPV 18/45  . Anemia   . Autoimmune gastritis   . Fatty liver   . Gastritis   . Heart murmur   . Hypertension   . Hyperthyroidism   . Internal hemorrhoids   . Right shoulder pain 2001   chronic pain  . Thyroid disease    Hyperthyroid. Was on PTU (Dr Hampton Abbot in [place]) - stopped in 2013, stable  . Tubular adenoma of colon     Family History  Problem Relation Age of Onset  . Kidney disease Mother        ESRD  . Hypertension Mother   . Cancer Father        lung ca  . Hypertension  Sister   . Diabetes Brother   . Diabetes Paternal Grandmother   . Colon cancer Neg Hx     Past Surgical History:  Procedure Laterality Date  . BREAST CYST ASPIRATION Left 2012  . CESAREAN SECTION     x 4   . HAND SURGERY Left   . SHOULDER SURGERY Right 2002  . SHOULDER SURGERY Left 2014  . TUBAL LIGATION     Social History   Occupational History  . RETIRED    Social History Main Topics  . Smoking status: Never Smoker  . Smokeless tobacco: Never Used  . Alcohol use No  . Drug use: No  .  Sexual activity: Yes    Birth control/ protection: Post-menopausal, Surgical     Comment: BTL-1st intercourse 89 yo-5 partners

## 2017-03-14 ENCOUNTER — Ambulatory Visit
Admission: RE | Admit: 2017-03-14 | Discharge: 2017-03-14 | Disposition: A | Discharge status: Home / Self Care | Payer: Federal, State, Local not specified - PPO | Source: Ambulatory Visit | Attending: Orthopedic Surgery | Admitting: Orthopedic Surgery

## 2017-03-14 DIAGNOSIS — M898X5 Other specified disorders of bone, thigh: Secondary | ICD-10-CM

## 2017-03-25 ENCOUNTER — Ambulatory Visit (INDEPENDENT_AMBULATORY_CARE_PROVIDER_SITE_OTHER): Payer: Federal, State, Local not specified - PPO

## 2017-03-25 ENCOUNTER — Encounter: Payer: Self-pay | Admitting: Internal Medicine

## 2017-03-25 ENCOUNTER — Ambulatory Visit (INDEPENDENT_AMBULATORY_CARE_PROVIDER_SITE_OTHER): Payer: Federal, State, Local not specified - PPO | Admitting: Internal Medicine

## 2017-03-25 DIAGNOSIS — E559 Vitamin D deficiency, unspecified: Secondary | ICD-10-CM

## 2017-03-25 DIAGNOSIS — E538 Deficiency of other specified B group vitamins: Secondary | ICD-10-CM | POA: Diagnosis not present

## 2017-03-25 DIAGNOSIS — S43439S Superior glenoid labrum lesion of unspecified shoulder, sequela: Secondary | ICD-10-CM

## 2017-03-25 DIAGNOSIS — I10 Essential (primary) hypertension: Secondary | ICD-10-CM

## 2017-03-25 DIAGNOSIS — Z23 Encounter for immunization: Secondary | ICD-10-CM

## 2017-03-25 DIAGNOSIS — M25511 Pain in right shoulder: Secondary | ICD-10-CM

## 2017-03-25 MED ORDER — MORPHINE SULFATE ER 30 MG PO TBCR: 30.0000 mg | EXTENDED_RELEASE_TABLET | Freq: Two times a day (BID) | ORAL | 0 refills | Status: DC

## 2017-03-25 MED ORDER — OXYCODONE HCL 15 MG PO TABS: 15.0000 mg | ORAL_TABLET | Freq: Two times a day (BID) | ORAL | 0 refills | Status: DC | PRN

## 2017-03-25 MED ORDER — DICYCLOMINE HCL 10 MG PO CAPS: 10.0000 mg | ORAL_CAPSULE | Freq: Three times a day (TID) | ORAL | 3 refills | Status: DC | PRN

## 2017-03-25 NOTE — Assessment & Plan Note (Signed)
On Vit D 

## 2017-03-25 NOTE — Assessment & Plan Note (Signed)
MIcardis HCT Norvasc

## 2017-03-25 NOTE — Progress Notes (Signed)
Subjective:  Patient ID: Karina Newman, female    DOB: 12-20-1955  Age: 61 y.o. MRN: [id number]  CC: No chief complaint on file.   HPI Karina Newman presents for a chronic shoulder pain, HAs, IBS f/u  Outpatient Medications Prior to Visit  Medication Sig Dispense Refill  . amLODipine (NORVASC) 5 MG tablet Take 1 tablet (5 mg total) by mouth daily. 90 tablet 3  . Cholecalciferol (VITAMIN D3) 2000 units capsule Take 1 capsule (2,000 Units total) by mouth daily. 100 capsule 3  . Cyanocobalamin (VITAMIN B-12) 500 MCG SUBL 1 sl qd 100 tablet 5  . Diclofenac Sodium (PENNSAID) 1.5 % SOLN Place 0.3 mLs onto the skin 4 (four) times daily as needed. 150 mL 6  . dicyclomine (BENTYL) 10 MG capsule Take 1 capsule (10 mg total) by mouth every 8 (eight) hours as needed for spasms. 60 capsule 3  . fluticasone (FLONASE) 50 MCG/ACT nasal spray Place 1 spray into both nostrils as needed.     Marland Kitchen morphine (MS CONTIN) 30 MG 12 hr tablet Take 1 tablet (30 mg total) by mouth every 12 (twelve) hours. 60 tablet 0  . omeprazole (PRILOSEC) 40 MG capsule Take 1 capsule (40 mg total) by mouth daily. 90 capsule 3  . oxyCODONE (ROXICODONE) 15 MG immediate release tablet Take 1 tablet (15 mg total) by mouth 2 (two) times daily as needed for pain. 60 tablet 0  . Probiotic Product (VSL#3 DS) PACK Take 112.5 each by mouth 2 (two) times daily. 60 each 3  . pseudoephedrine (SUDAFED) 120 MG 12 hr tablet Take 1 tablet (120 mg total) by mouth 2 (two) times daily as needed for congestion. 30 tablet 1  . SUMAtriptan (IMITREX) 100 MG tablet Take 1 tablet (100 mg total) by mouth every 2 (two) hours as needed for migraine. May repeat in 2 hours if headache persists or recurs. 12 tablet 5  . telmisartan-hydrochlorothiazide (MICARDIS HCT) 80-25 MG tablet TAKE 1 TABLET BY MOUTH EVERY DAY 90 tablet 2  . tiZANidine (ZANAFLEX) 4 MG tablet Take 1 tablet (4 mg total) by mouth every 8 (eight) hours as needed for muscle spasms. 60 tablet 1  .  valACYclovir (VALTREX) 500 MG tablet Take 1 tablet (500 mg total) by mouth 2 (two) times daily. For 5 days with outbreak 30 tablet 1   Facility-Administered Medications Prior to Visit  Medication Dose Route Frequency Provider Last Rate Last Dose  . 0.9 %  sodium chloride infusion  500 mL Intravenous Continuous Armbruster, Renelda Loma, MD        ROS Review of Systems  Constitutional: Negative for activity change, appetite change, chills, fatigue and unexpected weight change.  HENT: Negative for congestion, mouth sores and sinus pressure.   Eyes: Negative for visual disturbance.  Respiratory: Negative for cough and chest tightness.   Gastrointestinal: Negative for abdominal pain and nausea.  Genitourinary: Negative for difficulty urinating, frequency and vaginal pain.  Musculoskeletal: Positive for arthralgias, back pain and neck pain. Negative for gait problem.  Skin: Negative for pallor and rash.  Neurological: Negative for dizziness, tremors, weakness, numbness and headaches.  Psychiatric/Behavioral: Negative for confusion and sleep disturbance.    Objective:  BP 126/82 (BP Location: Left Arm, Patient Position: Sitting, Cuff Size: Normal)   Pulse 86   Temp 98.3 F (36.8 C) (Oral)   Ht 5\' 4"  (1.626 m)   Wt 165 lb (74.8 kg)   SpO2 99%   BMI 28.32 kg/m   BP Readings from Last  3 Encounters:  03/25/17 126/82  03/02/17 126/78  01/21/17 118/76    Wt Readings from Last 3 Encounters:  03/25/17 165 lb (74.8 kg)  03/02/17 167 lb (75.8 kg)  01/21/17 166 lb (75.3 kg)    Physical Exam  Constitutional: She appears well-developed. No distress.  HENT:  Head: Normocephalic.  Right Ear: External ear normal.  Left Ear: External ear normal.  Nose: Nose normal.  Mouth/Throat: Oropharynx is clear and moist.  Eyes: Pupils are equal, round, and reactive to light. Conjunctivae are normal. Right eye exhibits no discharge. Left eye exhibits no discharge.  Neck: Normal range of motion. Neck  supple. No JVD present. No tracheal deviation present. No thyromegaly present.  Cardiovascular: Normal rate, regular rhythm and normal heart sounds.   Pulmonary/Chest: No stridor. No respiratory distress. She has no wheezes.  Abdominal: Soft. Bowel sounds are normal. She exhibits no distension and no mass. There is no tenderness. There is no rebound and no guarding.  Musculoskeletal: She exhibits tenderness. She exhibits no edema.  Lymphadenopathy:    She has no cervical adenopathy.  Neurological: She displays normal reflexes. No cranial nerve deficit. She exhibits normal muscle tone. Coordination normal.  Skin: No rash noted. No erythema.  Psychiatric: She has a normal mood and affect. Her behavior is normal. Judgment and thought content normal.  L, R shoulder, L hip - painful  Lab Results  Component Value Date   WBC 5.0 01/19/2017   HGB 13.4 01/19/2017   HCT 38.9 01/19/2017   PLT 329.0 01/19/2017   GLUCOSE 112 (H) 01/19/2017   CHOL 169 01/19/2017   TRIG 95.0 01/19/2017   HDL 44.70 01/19/2017   LDLCALC 105 (H) 01/19/2017   ALT 25 01/19/2017   AST 18 01/19/2017   NA 140 01/19/2017   K 3.9 01/19/2017   CL 102 01/19/2017   CREATININE 0.87 01/19/2017   BUN 10 01/19/2017   CO2 31 01/19/2017   TSH 0.57 01/19/2017    Mr Frmur Right Wo Contrast  Result Date: 03/14/2017 CLINICAL DATA:  Proximal femur pain after fall in February 2017. EXAM: MRI OF THE RIGHT FEMUR WITHOUT CONTRAST TECHNIQUE: Multiplanar, multisequence MR imaging of the right femur was performed. No intravenous contrast was administered. COMPARISON:  Right hip MRI dated January 29, 2017. FINDINGS: Bones/Joint/Cartilage No suspicious marrow signal abnormality. No fracture or dislocation. No hip joint effusion. Muscles and Tendons Normal muscle bulk and signal. No edema or fatty atrophy. The visualized gluteal, iliopsoas, and hamstring tendons are intact. Soft tissues Deep to the skin marker along the posterolateral right thigh,  there is subtle stranding and nodularity of the subcutaneous fat, likely reflecting fat necrosis. No abnormal fluid collection. No hematoma. IMPRESSION: 1. Deep to the skin marker along the posterolateral right thigh, there is subtle stranding and nodularity in the subcutaneous fat, likely representing fat necrosis given history of prior trauma. No hematoma. 2. No acute osseous or soft tissue abnormality. Electronically Signed   By: Titus Dubin M.D.   On: 03/14/2017 14:16    Assessment & Plan:   There are no diagnoses linked to this encounter. I am having Karina Newman maintain her pseudoephedrine, Vitamin D3, Vitamin B-12, fluticasone, tiZANidine, amLODipine, telmisartan-hydrochlorothiazide, Diclofenac Sodium, SUMAtriptan, morphine, oxyCODONE, dicyclomine, VSL#3 DS, valACYclovir, and omeprazole. We will continue to administer sodium chloride.  No orders of the defined types were placed in this encounter.    Follow-up: No Follow-up on file.  Walker Kehr, MD

## 2017-03-25 NOTE — Assessment & Plan Note (Signed)
Pain meds renewed

## 2017-03-25 NOTE — Assessment & Plan Note (Signed)
On B12 

## 2017-03-25 NOTE — Progress Notes (Addendum)
Patient given the shingles vaccine and tolerated it well.  I was available to supervise the injection. Purnell Shoemaker, MD

## 2017-03-25 NOTE — Assessment & Plan Note (Signed)
Pain meds renewed   Potential benefits of a long term opioids use as well as potential risks (i.e. addiction risk, apnea etc) and complications (i.e. Somnolence, constipation and others) were explained to the patient and were aknowledged.

## 2017-04-07 ENCOUNTER — Ambulatory Visit (INDEPENDENT_AMBULATORY_CARE_PROVIDER_SITE_OTHER): Payer: Federal, State, Local not specified - PPO | Admitting: Orthopedic Surgery

## 2017-04-07 ENCOUNTER — Encounter (INDEPENDENT_AMBULATORY_CARE_PROVIDER_SITE_OTHER): Payer: Self-pay | Admitting: Orthopedic Surgery

## 2017-04-07 DIAGNOSIS — M25551 Pain in right hip: Secondary | ICD-10-CM | POA: Diagnosis not present

## 2017-04-10 NOTE — Progress Notes (Signed)
Office Visit Note   Patient: Karina Newman           Date of Birth: 1956/08/12           MRN: [id number] Visit Date: 04/07/2017 Requested by: Karina Anger, MD [place], [place] [address] PCP: Karina Anger, MD  Subjective: Chief Complaint  Patient presents with  . Right Leg - Follow-up    HPI: Karina Newman is a 61 year old patient with right femur pain.  She had a fall directly on her trochanteric region over a year ago and has been having somewhat incapacitating pain since that time.  She has had an extensive and resource intensive workup at this time including MRI scan of the lumbar spine which is negative MRI scan of the hip which is also negative as well as a recent MRI scan of the femur which shows some mild soft tissue stranding in the area of the trochanter.  This is consistent with very mild scarring.  She continues to have significant symptoms in this area.  She would like to have some type of resolution to her symptoms.  Copies of all 3 MRI reports are provided to the patient today              ROS: All systems reviewed are negative as they relate to the chief complaint within the history of present illness.  Patient denies  fevers or chills.   Assessment & Plan: Visit Diagnoses: No diagnosis found.  Plan: Impression is posttraumatic pain following contusion and bruising to the trochanteric region.  I don't think there is any predictable way to relieve the symptoms.  She does have some scarring noted around the gluteus maximus attachment around the femur.  This does not appear to impinge or compress the sciatic nerve in any way.  This is a very mild findings on the femoral MRI scan.  I discussed it with her and showed her the side-to-side comparison between the right left hip.  I would not favor an injection in this area.  Having this is something she will likely have to live with.  I'll see her back as needed.  I encouraged her to seek another confirmatory  second opinion if she desires.  Follow-Up Instructions: Return if symptoms worsen or fail to improve.   Orders:  No orders of the defined types were placed in this encounter.  No orders of the defined types were placed in this encounter.     Procedures: No procedures performed   Clinical Data: No additional findings.  Objective: Vital Signs: There were no vitals taken for this visit.  Physical Exam:   Constitutional: Patient appears well-developed HEENT:  Head: Normocephalic Eyes:EOM are normal Neck: Normal range of motion Cardiovascular: Normal rate Pulmonary/chest: Effort normal Neurologic: Patient is alert Skin: Skin is warm Psychiatric: Patient has normal mood and affect    Ortho Exam: Orthopedic exam demonstrates no groin pain with internal/external rotation of the leg no real asymmetry on visual inspection mild antalgic gait to the right intact motor sensory function in the legs no real change in physical exam from multiple prior visits  Specialty Comments:  No specialty comments available.  Imaging: No results found.   PMFS History: Patient Active Problem List   Diagnosis Date Noted  . Well adult exam 01/19/2017  . Abdominal pain 01/19/2017  . Headache 10/27/2016  . Acute pain of left shoulder 09/16/2016  . Left hip pain 09/16/2016  . Low vitamin B12 level 03/16/2016  .  Piriformis syndrome of right side 02/04/2016  . IT band syndrome 02/04/2016  . RUQ abdominal pain 02/04/2016  . Low back pain radiating to right lower extremity 09/19/2015  . Allergic rhinitis 09/19/2015  . Chest pain, atypical 04/07/2015  . GERD (gastroesophageal reflux disease) 04/07/2015  . Axillary adenitis 03/23/2015  . Migraine headache 03/23/2015  . Hives 01/29/2015  . Edema 01/29/2015  . Cervical disc disorder with radiculopathy of cervical region 12/11/2014  . Insomnia 07/31/2014  . Degenerative cervical disc 05/08/2014  . Pain in joint, shoulder region 03/05/2014  .  Patellofemoral arthralgia of both knees 03/01/2014  . Bilateral shoulder pain 10/18/2013  . Sinusitis, bacterial 08/20/2013  . URI, acute 08/14/2013  . Hemoptysis 08/14/2013  . Otitis media of left ear 08/14/2013  . Labral tear of shoulder 07/03/2013  . Hyperthyroidism 07/03/2013  . HTN (hypertension), benign 07/03/2013  . LVH (left ventricular hypertrophy) due to hypertensive disease 07/03/2013  . Vitamin D deficiency 07/03/2013   Past Medical History:  Diagnosis Date  . Abnormal cervical Pap smear with positive HPV DNA test 01/2014,01/2017   2015 Normal cytology with positive high-risk HPV 18/45. Colposcopy negative with negative ECC.  2018 normal cytology with positive high-risk HPV 18/45  . Anemia   . Autoimmune gastritis   . Fatty liver   . Gastritis   . Heart murmur   . Hypertension   . Hyperthyroidism   . Internal hemorrhoids   . Right shoulder pain 2001   chronic pain  . Thyroid disease    Hyperthyroid. Was on PTU (Dr Karina Newman in [place]) - stopped in 2013, stable  . Tubular adenoma of colon     Family History  Problem Relation Age of Onset  . Kidney disease Mother        ESRD  . Hypertension Mother   . Cancer Father        lung ca  . Hypertension Sister   . Diabetes Brother   . Diabetes Paternal Grandmother   . Colon cancer Neg Hx     Past Surgical History:  Procedure Laterality Date  . BREAST CYST ASPIRATION Left 2012  . CESAREAN SECTION     x 4   . HAND SURGERY Left   . SHOULDER SURGERY Right 2002  . SHOULDER SURGERY Left 2014  . TUBAL LIGATION     Social History   Occupational History  . RETIRED    Social History Main Topics  . Smoking status: Never Smoker  . Smokeless tobacco: Never Used  . Alcohol use No  . Drug use: No  . Sexual activity: Yes    Birth control/ protection: Post-menopausal, Surgical     Comment: BTL-1st intercourse 16 yo-5 partners

## 2017-04-25 ENCOUNTER — Telehealth: Payer: Self-pay | Admitting: Internal Medicine

## 2017-04-25 MED ORDER — OMEPRAZOLE 40 MG PO CPDR: 40.0000 mg | DELAYED_RELEASE_CAPSULE | Freq: Every day | ORAL | 3 refills | Status: DC

## 2017-04-25 NOTE — Telephone Encounter (Signed)
Done. Thx.

## 2017-04-25 NOTE — Telephone Encounter (Signed)
Pt stated Dr Alain Marion had previously printed her a hard script for omeprazole (PRILOSEC) 40 MG capsule.  She cannot find the script and is requesting this RX be called into [facility] on [address].

## 2017-05-21 ENCOUNTER — Other Ambulatory Visit: Payer: Self-pay | Admitting: Internal Medicine

## 2017-06-27 ENCOUNTER — Other Ambulatory Visit (INDEPENDENT_AMBULATORY_CARE_PROVIDER_SITE_OTHER): Payer: Federal, State, Local not specified - PPO

## 2017-06-27 ENCOUNTER — Ambulatory Visit: Payer: Federal, State, Local not specified - PPO

## 2017-06-27 ENCOUNTER — Ambulatory Visit (INDEPENDENT_AMBULATORY_CARE_PROVIDER_SITE_OTHER): Payer: Federal, State, Local not specified - PPO | Admitting: Internal Medicine

## 2017-06-27 ENCOUNTER — Encounter: Payer: Self-pay | Admitting: Internal Medicine

## 2017-06-27 VITALS — BP 130/80 | HR 77 | Temp 98.9°F | Ht 64.0 in | Wt 170.0 lb

## 2017-06-27 DIAGNOSIS — S43439S Superior glenoid labrum lesion of unspecified shoulder, sequela: Secondary | ICD-10-CM | POA: Diagnosis not present

## 2017-06-27 DIAGNOSIS — M545 Low back pain, unspecified: Secondary | ICD-10-CM

## 2017-06-27 DIAGNOSIS — Z23 Encounter for immunization: Secondary | ICD-10-CM | POA: Diagnosis not present

## 2017-06-27 DIAGNOSIS — M79604 Pain in right leg: Secondary | ICD-10-CM

## 2017-06-27 DIAGNOSIS — E538 Deficiency of other specified B group vitamins: Secondary | ICD-10-CM

## 2017-06-27 DIAGNOSIS — R7989 Other specified abnormal findings of blood chemistry: Secondary | ICD-10-CM

## 2017-06-27 LAB — BASIC METABOLIC PANEL
BUN: 11 mg/dL (ref 6–23)
CO2: 30 mEq/L (ref 19–32)
Calcium: 9.5 mg/dL (ref 8.4–10.5)
Chloride: 102 mEq/L (ref 96–112)
Creatinine, Ser: 0.79 mg/dL (ref 0.40–1.20)
GFR: 95.35 mL/min (ref >60.00)
Glucose, Bld: 95 mg/dL (ref 70–99)
Potassium: 4 mEq/L (ref 3.5–5.1)
SODIUM: 139 meq/L (ref 135–145)

## 2017-06-27 LAB — HEPATIC FUNCTION PANEL
ALT: 20 U/L (ref 0–35)
AST: 16 U/L (ref 0–37)
Albumin: 4.3 g/dL (ref 3.5–5.2)
Alkaline Phosphatase: 78 U/L (ref 39–117)
BILIRUBIN DIRECT: 0.1 mg/dL (ref 0.0–0.3)
BILIRUBIN TOTAL: 0.4 mg/dL (ref 0.2–1.2)
Total Protein: 8 g/dL (ref 6.0–8.3)

## 2017-06-27 MED ORDER — MORPHINE SULFATE ER 30 MG PO TBCR: 30.0000 mg | EXTENDED_RELEASE_TABLET | Freq: Two times a day (BID) | ORAL | 0 refills | Status: DC

## 2017-06-27 MED ORDER — OXYCODONE HCL 15 MG PO TABS: 15.0000 mg | ORAL_TABLET | Freq: Two times a day (BID) | ORAL | 0 refills | Status: DC | PRN

## 2017-06-27 NOTE — Assessment & Plan Note (Signed)
On B12 

## 2017-06-27 NOTE — Assessment & Plan Note (Signed)
LBP is better 

## 2017-06-27 NOTE — Assessment & Plan Note (Signed)
Rx renewed The pt would like to reduce her Oxycodone use eventually....she is not ready yet - try 1/2 tab prn

## 2017-06-27 NOTE — Patient Instructions (Signed)
Rice sock bag for heating pad

## 2017-06-27 NOTE — Addendum Note (Signed)
Addended by: Karle Barr on: 06/27/2017 03:01 PM   Modules accepted: Orders

## 2017-06-27 NOTE — Progress Notes (Signed)
Subjective:  Patient ID: Karina Newman, female    DOB: July 11, 1956  Age: 61 y.o. MRN: [id number]  CC: No chief complaint on file.   HPI Karina Newman presents for chronic pain, HTN, B12 def f/u The pt would like to reduce her Oxycodone use eventually....  Outpatient Medications Prior to Visit  Medication Sig Dispense Refill  . amLODipine (NORVASC) 5 MG tablet Take 1 tablet (5 mg total) by mouth daily. 90 tablet 1  . Cholecalciferol (VITAMIN D3) 2000 units capsule Take 1 capsule (2,000 Units total) by mouth daily. 100 capsule 3  . Cyanocobalamin (VITAMIN B-12) 500 MCG SUBL 1 sl qd 100 tablet 5  . Diclofenac Sodium (PENNSAID) 1.5 % SOLN Place 0.3 mLs onto the skin 4 (four) times daily as needed. 150 mL 6  . dicyclomine (BENTYL) 10 MG capsule Take 1 capsule (10 mg total) by mouth every 8 (eight) hours as needed for spasms. 60 capsule 3  . fluticasone (FLONASE) 50 MCG/ACT nasal spray Place 1 spray into both nostrils as needed.     Marland Kitchen morphine (MS CONTIN) 30 MG 12 hr tablet Take 1 tablet (30 mg total) by mouth every 12 (twelve) hours. 60 tablet 0  . omeprazole (PRILOSEC) 40 MG capsule Take 1 capsule (40 mg total) by mouth daily. 90 capsule 3  . oxyCODONE (ROXICODONE) 15 MG immediate release tablet Take 1 tablet (15 mg total) by mouth 2 (two) times daily as needed for pain. 60 tablet 0  . Probiotic Product (VSL#3 DS) PACK Take 112.5 each by mouth 2 (two) times daily. 60 each 3  . pseudoephedrine (SUDAFED) 120 MG 12 hr tablet Take 1 tablet (120 mg total) by mouth 2 (two) times daily as needed for congestion. 30 tablet 1  . SUMAtriptan (IMITREX) 100 MG tablet Take 1 tablet (100 mg total) by mouth every 2 (two) hours as needed for migraine. May repeat in 2 hours if headache persists or recurs. 12 tablet 5  . telmisartan-hydrochlorothiazide (MICARDIS HCT) 80-25 MG tablet TAKE 1 TABLET BY MOUTH EVERY DAY 90 tablet 2  . tiZANidine (ZANAFLEX) 4 MG tablet Take 1 tablet (4 mg total) by mouth every 8  (eight) hours as needed for muscle spasms. 60 tablet 1  . valACYclovir (VALTREX) 500 MG tablet Take 1 tablet (500 mg total) by mouth 2 (two) times daily. For 5 days with outbreak 30 tablet 1   Facility-Administered Medications Prior to Visit  Medication Dose Route Frequency Provider Last Rate Last Dose  . 0.9 %  sodium chloride infusion  500 mL Intravenous Continuous Armbruster, Carlota Raspberry, MD        ROS Review of Systems  Constitutional: Positive for fatigue. Negative for activity change, appetite change, chills and unexpected weight change.  HENT: Negative for congestion, mouth sores and sinus pressure.   Eyes: Negative for visual disturbance.  Respiratory: Negative for cough and chest tightness.   Gastrointestinal: Negative for abdominal pain and nausea.  Genitourinary: Negative for difficulty urinating, frequency and vaginal pain.  Musculoskeletal: Positive for arthralgias, back pain and neck stiffness. Negative for gait problem.  Skin: Negative for pallor and rash.  Neurological: Negative for dizziness, tremors, weakness, numbness and headaches.  Psychiatric/Behavioral: Negative for confusion and sleep disturbance.    Objective:  BP 130/80 (BP Location: Right Arm, Patient Position: Sitting, Cuff Size: Large)   Pulse 77   Temp 98.9 F (37.2 C) (Oral)   Ht 5\' 4"  (1.626 m)   Wt 170 lb (77.1 kg)   SpO2 99%  BMI 29.18 kg/m   BP Readings from Last 3 Encounters:  06/27/17 130/80  03/25/17 126/82  03/02/17 126/78    Wt Readings from Last 3 Encounters:  06/27/17 170 lb (77.1 kg)  03/25/17 165 lb (74.8 kg)  03/02/17 167 lb (75.8 kg)    Physical Exam  Constitutional: She appears well-developed. No distress.  HENT:  Head: Normocephalic.  Right Ear: External ear normal.  Left Ear: External ear normal.  Nose: Nose normal.  Mouth/Throat: Oropharynx is clear and moist.  Eyes: Conjunctivae are normal. Pupils are equal, round, and reactive to light. Right eye exhibits no  discharge. Left eye exhibits no discharge.  Neck: Normal range of motion. Neck supple. No JVD present. No tracheal deviation present. No thyromegaly present.  Cardiovascular: Normal rate, regular rhythm and normal heart sounds.  Pulmonary/Chest: No stridor. No respiratory distress. She has no wheezes.  Abdominal: Soft. Bowel sounds are normal. She exhibits no distension and no mass. There is no tenderness. There is no rebound and no guarding.  Musculoskeletal: She exhibits tenderness. She exhibits no edema.  Lymphadenopathy:    She has no cervical adenopathy.  Neurological: She displays normal reflexes. No cranial nerve deficit. She exhibits normal muscle tone. Coordination normal.  Skin: No rash noted. No erythema.  Psychiatric: She has a normal mood and affect. Her behavior is normal. Judgment and thought content normal.    Lab Results  Component Value Date   WBC 5.0 01/19/2017   HGB 13.4 01/19/2017   HCT 38.9 01/19/2017   PLT 329.0 01/19/2017   GLUCOSE 112 (H) 01/19/2017   CHOL 169 01/19/2017   TRIG 95.0 01/19/2017   HDL 44.70 01/19/2017   LDLCALC 105 (H) 01/19/2017   ALT 25 01/19/2017   AST 18 01/19/2017   NA 140 01/19/2017   K 3.9 01/19/2017   CL 102 01/19/2017   CREATININE 0.87 01/19/2017   BUN 10 01/19/2017   CO2 31 01/19/2017   TSH 0.57 01/19/2017    Mr Frmur Right Wo Contrast  Result Date: 03/14/2017 CLINICAL DATA:  Proximal femur pain after fall in February 2017. EXAM: MRI OF THE RIGHT FEMUR WITHOUT CONTRAST TECHNIQUE: Multiplanar, multisequence MR imaging of the right femur was performed. No intravenous contrast was administered. COMPARISON:  Right hip MRI dated January 29, 2017. FINDINGS: Bones/Joint/Cartilage No suspicious marrow signal abnormality. No fracture or dislocation. No hip joint effusion. Muscles and Tendons Normal muscle bulk and signal. No edema or fatty atrophy. The visualized gluteal, iliopsoas, and hamstring tendons are intact. Soft tissues Deep to the  skin marker along the posterolateral right thigh, there is subtle stranding and nodularity of the subcutaneous fat, likely reflecting fat necrosis. No abnormal fluid collection. No hematoma. IMPRESSION: 1. Deep to the skin marker along the posterolateral right thigh, there is subtle stranding and nodularity in the subcutaneous fat, likely representing fat necrosis given history of prior trauma. No hematoma. 2. No acute osseous or soft tissue abnormality. Electronically Signed   By: Titus Dubin M.D.   On: 03/14/2017 14:16    Assessment & Plan:   There are no diagnoses linked to this encounter. I am having Sheran Spine maintain her pseudoephedrine, Vitamin D3, Vitamin B-12, fluticasone, tiZANidine, telmisartan-hydrochlorothiazide, Diclofenac Sodium, SUMAtriptan, VSL#3 DS, valACYclovir, oxyCODONE, morphine, dicyclomine, omeprazole, and amLODipine. We will continue to administer sodium chloride.  No orders of the defined types were placed in this encounter.    Follow-up: No Follow-up on file.  Walker Kehr, MD

## 2017-08-13 ENCOUNTER — Other Ambulatory Visit: Payer: Self-pay | Admitting: Internal Medicine

## 2017-08-17 ENCOUNTER — Encounter: Payer: Self-pay | Admitting: Internal Medicine

## 2017-08-17 ENCOUNTER — Ambulatory Visit (INDEPENDENT_AMBULATORY_CARE_PROVIDER_SITE_OTHER): Admitting: Internal Medicine

## 2017-08-17 ENCOUNTER — Other Ambulatory Visit

## 2017-08-17 DIAGNOSIS — S43439S Superior glenoid labrum lesion of unspecified shoulder, sequela: Secondary | ICD-10-CM

## 2017-08-17 DIAGNOSIS — G8929 Other chronic pain: Secondary | ICD-10-CM

## 2017-08-17 DIAGNOSIS — M25511 Pain in right shoulder: Secondary | ICD-10-CM

## 2017-08-17 NOTE — Assessment & Plan Note (Addendum)
Chronic pain in the right shoulder On Avinza and Oxycodone since 2007 per [facility]  Potential benefits of a long term opioids use as well as potential risks (i.e. addiction risk, apnea etc) and complications (i.e. Somnolence, constipation and others) were explained to the patient and were aknowledged. R shoulder chronic pain/disability - disabled since Oct 2012 R shoulder pain 2015 5/17 - will try to reduce Oxycodone to bid prn starting 6/17  Avinza was switched to generic - it was n/a at the drug stores on a regular bases - changed to MS contin; reduced Oxy to 7.5-15 mg bid  Will ref to the Pain  [facility]

## 2017-08-17 NOTE — Progress Notes (Signed)
Subjective:  Patient ID: Karina Newman, female    DOB: 1956/08/10  Age: 62 y.o. MRN: [id number]  CC: No chief complaint on file.   HPI Raynelle Fujikawa presents for her chronic R shoulder pain on opioid   Outpatient Medications Prior to Visit  Medication Sig Dispense Refill  . amLODipine (NORVASC) 5 MG tablet Take 1 tablet (5 mg total) by mouth daily. 90 tablet 1  . Cholecalciferol (VITAMIN D3) 2000 units capsule Take 1 capsule (2,000 Units total) by mouth daily. 100 capsule 3  . Cyanocobalamin (VITAMIN B-12) 500 MCG SUBL 1 sl qd 100 tablet 5  . Diclofenac Sodium (PENNSAID) 1.5 % SOLN Place 0.3 mLs onto the skin 4 (four) times daily as needed. 150 mL 6  . dicyclomine (BENTYL) 10 MG capsule TAKE 1 CAPSULE (10 MG TOTAL) BY MOUTH EVERY 8 (EIGHT) HOURS AS NEEDED FOR SPASMS. 60 capsule 1  . fluticasone (FLONASE) 50 MCG/ACT nasal spray Place 1 spray into both nostrils as needed.     Marland Kitchen morphine (MS CONTIN) 30 MG 12 hr tablet Take 1 tablet (30 mg total) every 12 (twelve) hours by mouth. 60 tablet 0  . omeprazole (PRILOSEC) 40 MG capsule Take 1 capsule (40 mg total) by mouth daily. 90 capsule 3  . oxyCODONE (ROXICODONE) 15 MG immediate release tablet Take 1 tablet (15 mg total) 2 (two) times daily as needed by mouth for pain. 60 tablet 0  . Probiotic Product (VSL#3 DS) PACK Take 112.5 each by mouth 2 (two) times daily. 60 each 3  . pseudoephedrine (SUDAFED) 120 MG 12 hr tablet Take 1 tablet (120 mg total) by mouth 2 (two) times daily as needed for congestion. 30 tablet 1  . SUMAtriptan (IMITREX) 100 MG tablet Take 1 tablet (100 mg total) by mouth every 2 (two) hours as needed for migraine. May repeat in 2 hours if headache persists or recurs. 12 tablet 5  . telmisartan-hydrochlorothiazide (MICARDIS HCT) 80-25 MG tablet TAKE 1 TABLET BY MOUTH EVERY DAY 90 tablet 2  . tiZANidine (ZANAFLEX) 4 MG tablet Take 1 tablet (4 mg total) by mouth every 8 (eight) hours as needed for muscle spasms. 60 tablet 1  .  valACYclovir (VALTREX) 500 MG tablet Take 1 tablet (500 mg total) by mouth 2 (two) times daily. For 5 days with outbreak 30 tablet 1   Facility-Administered Medications Prior to Visit  Medication Dose Route Frequency Provider Last Rate Last Dose  . 0.9 %  sodium chloride infusion  500 mL Intravenous Continuous Armbruster, Carlota Raspberry, MD        ROS Review of Systems  Constitutional: Negative for activity change, appetite change, chills, fatigue and unexpected weight change.  HENT: Negative for congestion, mouth sores and sinus pressure.   Eyes: Negative for visual disturbance.  Respiratory: Negative for cough and chest tightness.   Gastrointestinal: Negative for abdominal pain and nausea.  Genitourinary: Negative for difficulty urinating, frequency and vaginal pain.  Musculoskeletal: Positive for arthralgias. Negative for back pain and gait problem.  Skin: Negative for pallor and rash.  Neurological: Negative for dizziness, tremors, weakness, numbness and headaches.  Psychiatric/Behavioral: Negative for confusion and sleep disturbance.    Objective:  BP (!) 146/88 (BP Location: Left Arm, Patient Position: Sitting, Cuff Size: Large)   Pulse 82   Temp 98.2 F (36.8 C) (Oral)   Ht 5\' 4"  (1.626 m)   Wt 169 lb (76.7 kg)   SpO2 99%   BMI 29.01 kg/m   BP Readings from Last  3 Encounters:  08/17/17 (!) 146/88  06/27/17 130/80  03/25/17 126/82    Wt Readings from Last 3 Encounters:  08/17/17 169 lb (76.7 kg)  06/27/17 170 lb (77.1 kg)  03/25/17 165 lb (74.8 kg)    Physical Exam  Constitutional: She appears well-developed. No distress.  HENT:  Head: Normocephalic.  Right Ear: External ear normal.  Left Ear: External ear normal.  Nose: Nose normal.  Mouth/Throat: Oropharynx is clear and moist.  Eyes: Conjunctivae are normal. Pupils are equal, round, and reactive to light. Right eye exhibits no discharge. Left eye exhibits no discharge.  Neck: Normal range of motion. Neck supple.  No JVD present. No tracheal deviation present. No thyromegaly present.  Cardiovascular: Normal rate, regular rhythm and normal heart sounds.  Pulmonary/Chest: No stridor. No respiratory distress. She has no wheezes.  Abdominal: Soft. Bowel sounds are normal. She exhibits no distension and no mass. There is no tenderness. There is no rebound and no guarding.  Musculoskeletal: She exhibits tenderness. She exhibits no edema.  Lymphadenopathy:    She has no cervical adenopathy.  Neurological: She displays normal reflexes. No cranial nerve deficit. She exhibits normal muscle tone. Coordination normal.  Skin: No rash noted. No erythema.  Psychiatric: She has a normal mood and affect. Her behavior is normal. Judgment and thought content normal.  R shoulder is painful  Lab Results  Component Value Date   WBC 5.0 01/19/2017   HGB 13.4 01/19/2017   HCT 38.9 01/19/2017   PLT 329.0 01/19/2017   GLUCOSE 95 06/27/2017   CHOL 169 01/19/2017   TRIG 95.0 01/19/2017   HDL 44.70 01/19/2017   LDLCALC 105 (H) 01/19/2017   ALT 20 06/27/2017   AST 16 06/27/2017   NA 139 06/27/2017   K 4.0 06/27/2017   CL 102 06/27/2017   CREATININE 0.79 06/27/2017   BUN 11 06/27/2017   CO2 30 06/27/2017   TSH 0.57 01/19/2017    Mr Frmur Right Wo Contrast  Result Date: 03/14/2017 CLINICAL DATA:  Proximal femur pain after fall in February 2017. EXAM: MRI OF THE RIGHT FEMUR WITHOUT CONTRAST TECHNIQUE: Multiplanar, multisequence MR imaging of the right femur was performed. No intravenous contrast was administered. COMPARISON:  Right hip MRI dated January 29, 2017. FINDINGS: Bones/Joint/Cartilage No suspicious marrow signal abnormality. No fracture or dislocation. No hip joint effusion. Muscles and Tendons Normal muscle bulk and signal. No edema or fatty atrophy. The visualized gluteal, iliopsoas, and hamstring tendons are intact. Soft tissues Deep to the skin marker along the posterolateral right thigh, there is subtle  stranding and nodularity of the subcutaneous fat, likely reflecting fat necrosis. No abnormal fluid collection. No hematoma. IMPRESSION: 1. Deep to the skin marker along the posterolateral right thigh, there is subtle stranding and nodularity in the subcutaneous fat, likely representing fat necrosis given history of prior trauma. No hematoma. 2. No acute osseous or soft tissue abnormality. Electronically Signed   By: Titus Dubin M.D.   On: 03/14/2017 14:16    Assessment & Plan:   Diagnoses and all orders for this visit:  Glenoid labrum tear, unspecified laterality, sequela -     Pain Management Screening Profile (10S) -     Ambulatory referral to Pain Clinic   I am having Sheran Spine maintain her pseudoephedrine, Vitamin D3, Vitamin B-12, fluticasone, tiZANidine, telmisartan-hydrochlorothiazide, Diclofenac Sodium, SUMAtriptan, VSL#3 DS, valACYclovir, omeprazole, amLODipine, oxyCODONE, morphine, and dicyclomine. We will continue to administer sodium chloride.  No orders of the defined types were placed in  this encounter.    Follow-up: No Follow-up on file.  Walker Kehr, MD

## 2017-08-19 LAB — PMP SCREEN PROFILE (10S), URINE

## 2017-08-22 ENCOUNTER — Other Ambulatory Visit: Payer: Self-pay | Admitting: Internal Medicine

## 2017-08-24 ENCOUNTER — Other Ambulatory Visit: Payer: Federal, State, Local not specified - PPO

## 2017-08-24 DIAGNOSIS — R892 Abnormal level of other drugs, medicaments and biological substances in specimens from other organs, systems and tissues: Secondary | ICD-10-CM

## 2017-08-24 DIAGNOSIS — S43439S Superior glenoid labrum lesion of unspecified shoulder, sequela: Secondary | ICD-10-CM

## 2017-08-25 LAB — PMP SCREEN PROFILE (10S), URINE
Amphetamine Scrn, Ur: POSITIVE ng/mL — AB
BARBITURATE SCREEN URINE: NEGATIVE ng/mL
BENZODIAZEPINE SCREEN, URINE: NEGATIVE ng/mL
CANNABINOIDS UR QL SCN: NEGATIVE ng/mL
Cocaine (Metab) Scrn, Ur: NEGATIVE ng/mL
Creatinine(Crt), U: 204.7 mg/dL (ref 20.0–300.0)
Methadone Screen, Urine: NEGATIVE ng/mL
OPIATE SCREEN URINE: POSITIVE ng/mL — AB
OXYCODONE+OXYMORPHONE UR QL SCN: POSITIVE ng/mL — AB
PH UR, DRUG SCRN: 5.4 (ref 4.5–8.9)
Phencyclidine Qn, Ur: NEGATIVE ng/mL
Propoxyphene Scrn, Ur: NEGATIVE ng/mL

## 2017-08-30 ENCOUNTER — Other Ambulatory Visit: Payer: Self-pay | Admitting: Internal Medicine

## 2017-08-30 ENCOUNTER — Other Ambulatory Visit: Payer: Federal, State, Local not specified - PPO

## 2017-08-30 DIAGNOSIS — R892 Abnormal level of other drugs, medicaments and biological substances in specimens from other organs, systems and tissues: Secondary | ICD-10-CM

## 2017-08-31 LAB — PMP SCREEN PROFILE (10S), URINE
Amphetamine Scrn, Ur: NEGATIVE ng/mL
BARBITURATE SCREEN URINE: NEGATIVE ng/mL
BENZODIAZEPINE SCREEN, URINE: NEGATIVE ng/mL
CANNABINOIDS UR QL SCN: NEGATIVE ng/mL
Cocaine (Metab) Scrn, Ur: NEGATIVE ng/mL
Creatinine(Crt), U: 51.3 mg/dL (ref 20.0–300.0)
METHADONE SCREEN, URINE: NEGATIVE ng/mL
OXYCODONE+OXYMORPHONE UR QL SCN: POSITIVE ng/mL — AB
Opiate Scrn, Ur: POSITIVE ng/mL — AB
PH UR, DRUG SCRN: 5.9 (ref 4.5–8.9)
PHENCYCLIDINE QUANTITATIVE URINE: NEGATIVE ng/mL
Propoxyphene Scrn, Ur: NEGATIVE ng/mL

## 2017-09-09 IMAGING — MR MR LUMBAR SPINE W/O CM
5 series · 47 of 48 positions shown · non-contrast
Comparison: None.

CLINICAL DATA: Right hip and buttock pain radiating to the back of
the leg

EXAM:
MRI LUMBAR SPINE WITHOUT CONTRAST
TECHNIQUE: Multiplanar, multisequence MR imaging of the lumbar spine was
performed. No intravenous contrast was administered.

[Series 3: T2 · sagittal · 4.0mm · 0.94mm/px · 6 of 13 slices shown (1 of 2)]
[im 1/13]
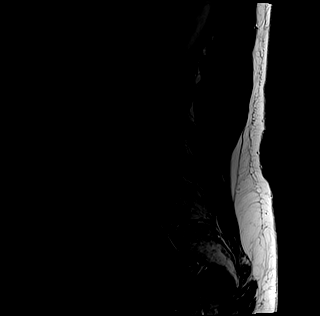
[im 3/13]
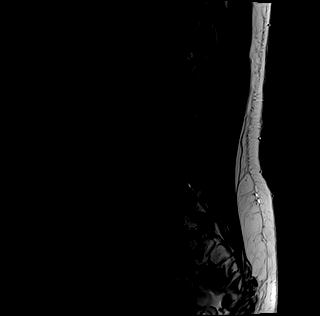
[im 5/13]
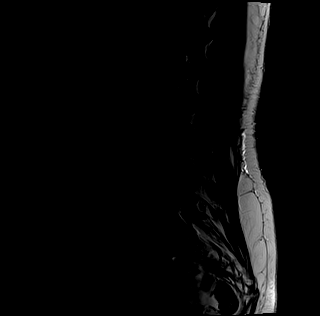
[im 8/13]
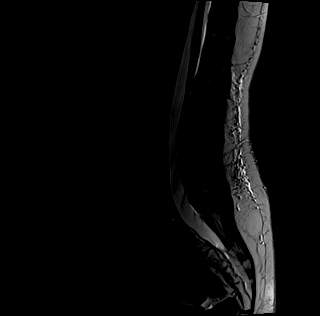
[im 10/13]
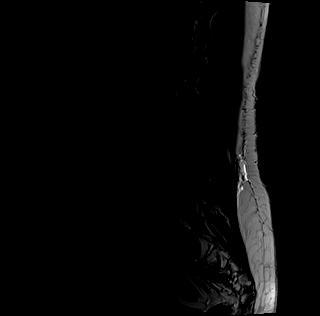
[im 13/13]
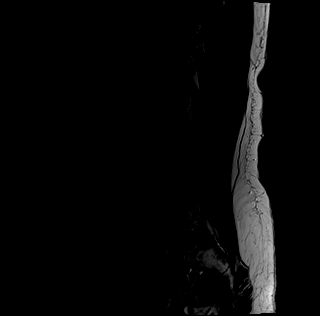

[Series 4: T1 · sagittal · 4.0mm · 0.94mm/px · 5 of 13 slices shown (1 of 2)]
[im 1/13]
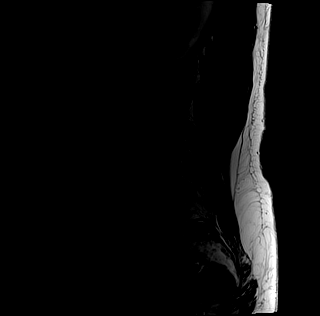
[im 4/13]
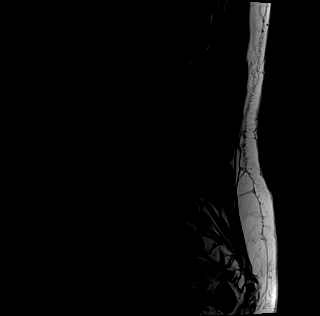
[im 7/13]
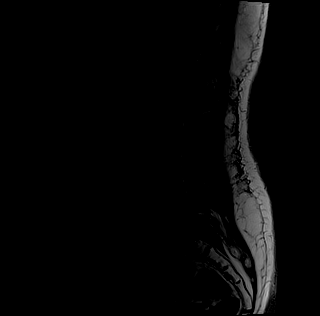
[im 10/13]
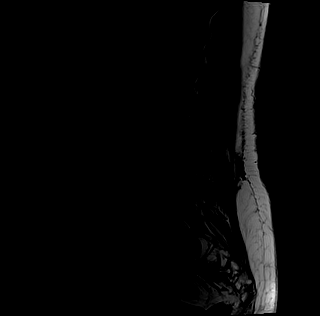
[im 13/13]
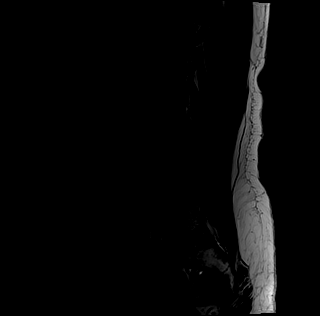

[Series 5: tirm sag · sagittal · 4.0mm · 0.59mm/px · 5 of 13 slices shown]
[im 1/13]
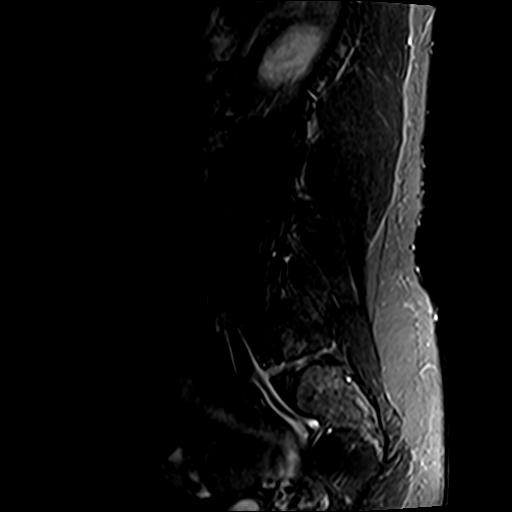
[im 4/13]
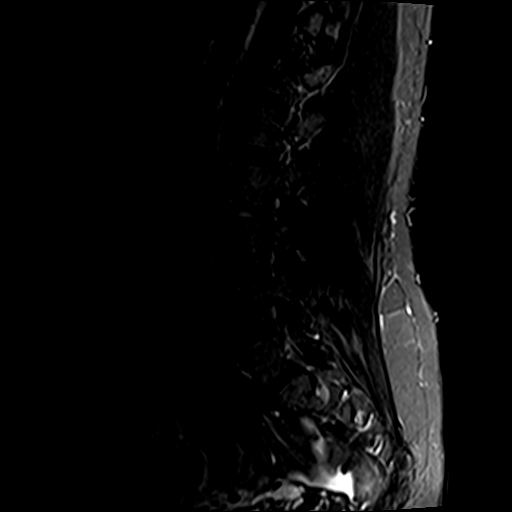
[im 7/13]
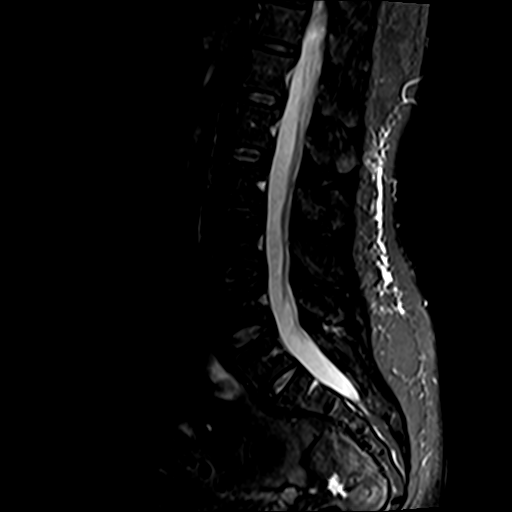
[im 10/13]
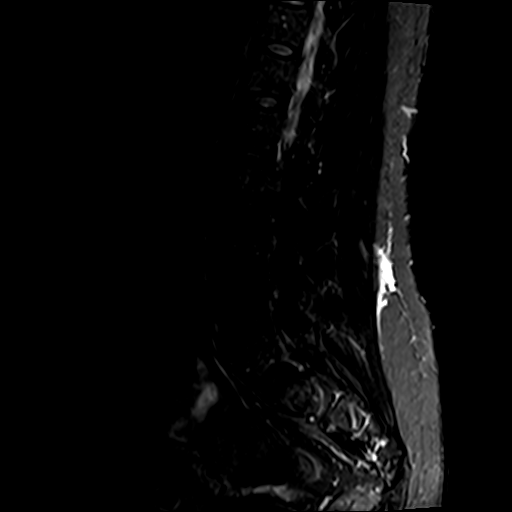
[im 13/13]
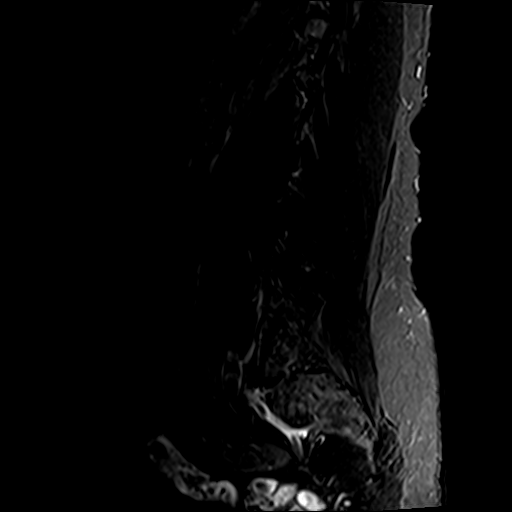

[Series 6: T1 · axial · 4.0mm · 0.78mm/px · z∈[-152,+65]mm · 15 of 40 slices shown (2 of 2)]
[im 1/40]
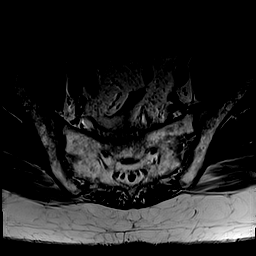
[im 3/40]
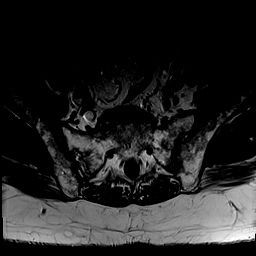
[im 6/40]
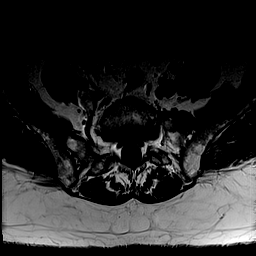
[im 8/40]
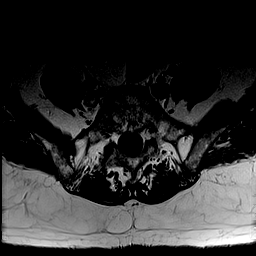
[im 11/40]
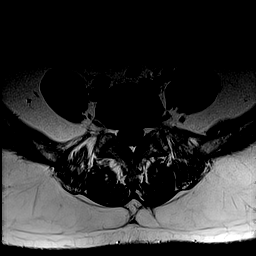
[im 14/40]
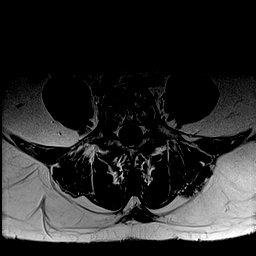
[im 16/40]
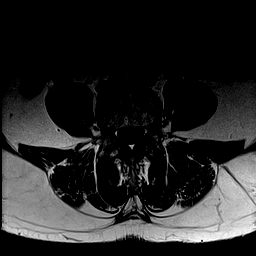
[im 19/40]
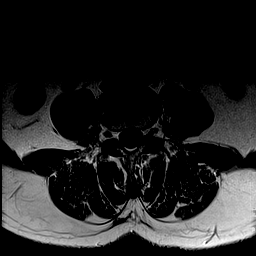
[im 21/40]
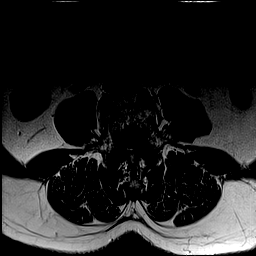
[im 24/40]
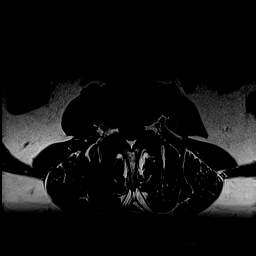
[im 27/40]
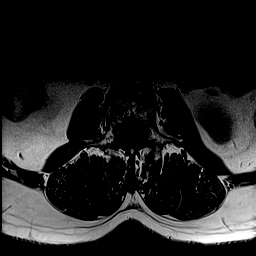
[im 29/40]
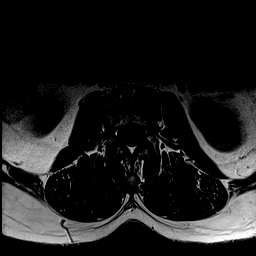
[im 32/40]
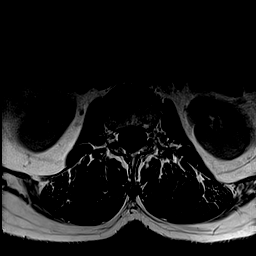
[im 34/40]
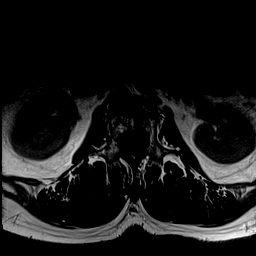
[im 40/40]
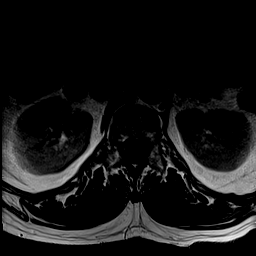

[Series 7: T2 · axial · 4.0mm · 0.78mm/px · z∈[-152,+65]mm · 16 of 40 slices shown (2 of 2)]
[im 1/40]
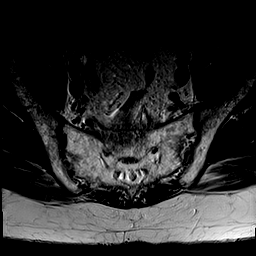
[im 3/40]
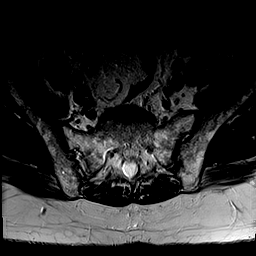
[im 6/40]
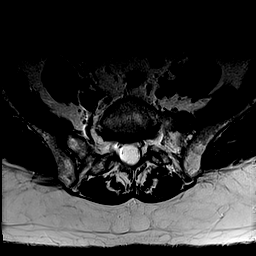
[im 8/40]
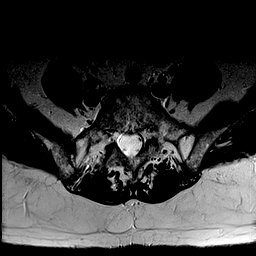
[im 11/40]
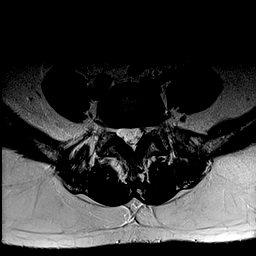
[im 14/40]
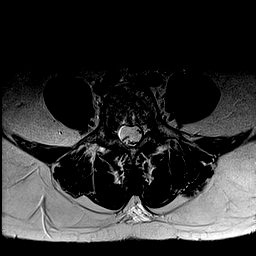
[im 16/40]
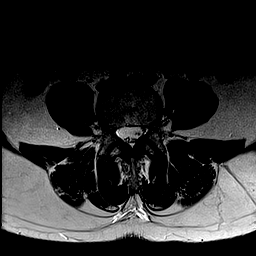
[im 19/40]
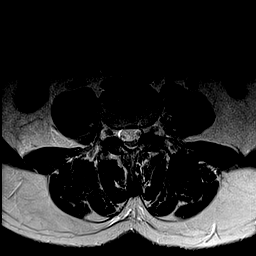
[im 21/40]
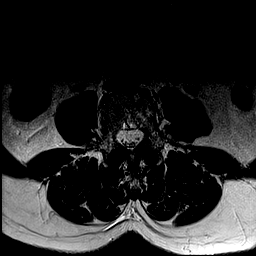
[im 24/40]
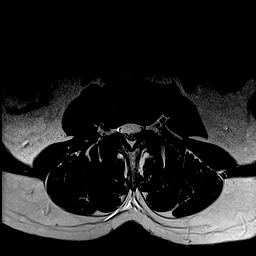
[im 27/40]
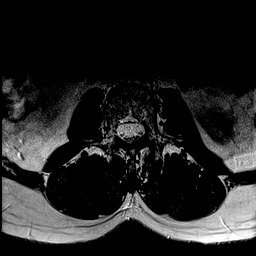
[im 29/40]
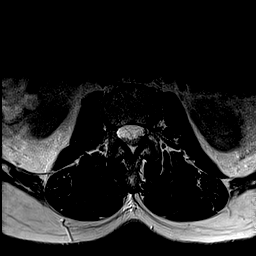
[im 32/40]
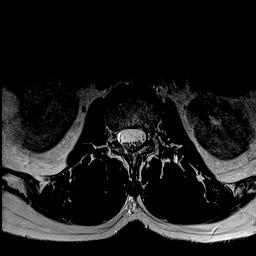
[im 34/40]
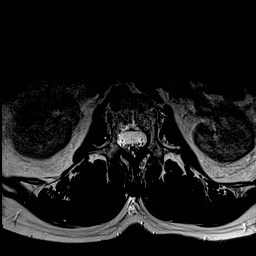
[im 37/40]
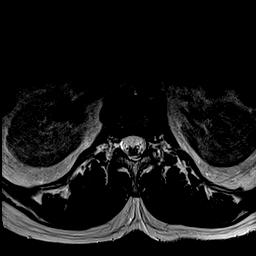
[im 40/40]
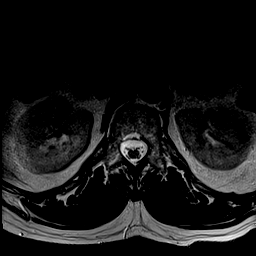

[47 of 48 positions shown; findings below may reference images not displayed]

FINDINGS: Segmentation:  Standard.

Alignment:  Physiologic.

Vertebrae:  No fracture, evidence of discitis, or bone lesion.

Conus medullaris: Extends to the T12-L1 level and appears normal.

Paraspinal and other soft tissues: Negative.

Disc levels:

Disc spaces: Disc spaces are maintained.

T12-L1: No significant disc bulge. No evidence of neural foraminal
stenosis. No central canal stenosis.

L1-L2: No significant disc bulge. No evidence of neural foraminal
stenosis. No central canal stenosis.

L2-L3: No significant disc bulge. No evidence of neural foraminal
stenosis. No central canal stenosis. Mild bilateral facet
arthropathy.

L3-L4: No significant disc bulge. No evidence of neural foraminal
stenosis. No central canal stenosis. Mild bilateral facet
arthropathy.

L4-L5: No significant disc bulge. No evidence of neural foraminal
stenosis. No central canal stenosis.

L5-S1: No significant disc bulge. No evidence of neural foraminal
stenosis. No central canal stenosis.
IMPRESSION: 1. No significant lumbar spine disc protrusion, foraminal stenosis
or central canal stenosis.

## 2017-09-12 ENCOUNTER — Other Ambulatory Visit (INDEPENDENT_AMBULATORY_CARE_PROVIDER_SITE_OTHER): Payer: Federal, State, Local not specified - PPO

## 2017-09-12 ENCOUNTER — Encounter: Payer: Self-pay | Admitting: Internal Medicine

## 2017-09-12 ENCOUNTER — Ambulatory Visit: Payer: Federal, State, Local not specified - PPO | Admitting: Internal Medicine

## 2017-09-12 DIAGNOSIS — G4489 Other headache syndrome: Secondary | ICD-10-CM | POA: Diagnosis not present

## 2017-09-12 DIAGNOSIS — I10 Essential (primary) hypertension: Secondary | ICD-10-CM

## 2017-09-12 DIAGNOSIS — M25511 Pain in right shoulder: Secondary | ICD-10-CM | POA: Diagnosis not present

## 2017-09-12 DIAGNOSIS — E559 Vitamin D deficiency, unspecified: Secondary | ICD-10-CM | POA: Diagnosis not present

## 2017-09-12 DIAGNOSIS — R5383 Other fatigue: Secondary | ICD-10-CM | POA: Diagnosis not present

## 2017-09-12 DIAGNOSIS — M545 Low back pain, unspecified: Secondary | ICD-10-CM

## 2017-09-12 DIAGNOSIS — E538 Deficiency of other specified B group vitamins: Secondary | ICD-10-CM | POA: Diagnosis not present

## 2017-09-12 DIAGNOSIS — S43439S Superior glenoid labrum lesion of unspecified shoulder, sequela: Secondary | ICD-10-CM | POA: Diagnosis not present

## 2017-09-12 DIAGNOSIS — G5701 Lesion of sciatic nerve, right lower limb: Secondary | ICD-10-CM | POA: Diagnosis not present

## 2017-09-12 DIAGNOSIS — M79604 Pain in right leg: Secondary | ICD-10-CM

## 2017-09-12 LAB — CBC WITH DIFFERENTIAL/PLATELET
Basophils Absolute: 0 10*3/uL (ref 0.0–0.1)
Basophils Relative: 0.8 % (ref 0.0–3.0)
EOS PCT: 1.3 % (ref 0.0–5.0)
Eosinophils Absolute: 0.1 10*3/uL (ref 0.0–0.7)
HEMATOCRIT: 40.5 % (ref 36.0–46.0)
HEMOGLOBIN: 13.6 g/dL (ref 12.0–15.0)
LYMPHS PCT: 36.5 % (ref 12.0–46.0)
Lymphs Abs: 1.9 10*3/uL (ref 0.7–4.0)
MCHC: 33.6 g/dL (ref 30.0–36.0)
MCV: 92.1 fl (ref 78.0–100.0)
MONO ABS: 0.4 10*3/uL (ref 0.1–1.0)
MONOS PCT: 8.1 % (ref 3.0–12.0)
Neutro Abs: 2.7 10*3/uL (ref 1.4–7.7)
Neutrophils Relative %: 53.3 % (ref 43.0–77.0)
Platelets: 396 10*3/uL (ref 150.0–400.0)
RBC: 4.4 Mil/uL (ref 3.87–5.11)
RDW: 12 % (ref 11.5–15.5)
WBC: 5.1 10*3/uL (ref 4.0–10.5)

## 2017-09-12 LAB — HEPATIC FUNCTION PANEL
ALT: 26 U/L (ref 0–35)
AST: 18 U/L (ref 0–37)
Albumin: 4.2 g/dL (ref 3.5–5.2)
Alkaline Phosphatase: 82 U/L (ref 39–117)
BILIRUBIN DIRECT: 0.1 mg/dL (ref 0.0–0.3)
BILIRUBIN TOTAL: 0.5 mg/dL (ref 0.2–1.2)
Total Protein: 8 g/dL (ref 6.0–8.3)

## 2017-09-12 LAB — URINALYSIS
Bilirubin Urine: NEGATIVE
HGB URINE DIPSTICK: NEGATIVE
Ketones, ur: NEGATIVE
Leukocytes, UA: NEGATIVE
NITRITE: NEGATIVE
SPECIFIC GRAVITY, URINE: 1.015 (ref 1.000–1.030)
TOTAL PROTEIN, URINE-UPE24: NEGATIVE
Urine Glucose: NEGATIVE
Urobilinogen, UA: 0.2 (ref 0.0–1.0)
pH: 6 (ref 5.0–8.0)

## 2017-09-12 LAB — BASIC METABOLIC PANEL
BUN: 12 mg/dL (ref 6–23)
CHLORIDE: 98 meq/L (ref 96–112)
CO2: 31 mEq/L (ref 19–32)
Calcium: 9.3 mg/dL (ref 8.4–10.5)
Creatinine, Ser: 0.88 mg/dL (ref 0.40–1.20)
GFR: 84.13 mL/min (ref >60.00)
Glucose, Bld: 106 mg/dL — ABNORMAL HIGH (ref 70–99)
POTASSIUM: 3.8 meq/L (ref 3.5–5.1)
SODIUM: 136 meq/L (ref 135–145)

## 2017-09-12 LAB — VITAMIN D 25 HYDROXY (VIT D DEFICIENCY, FRACTURES): VITD: 49.39 ng/mL (ref 30.00–100.00)

## 2017-09-12 LAB — VITAMIN B12

## 2017-09-12 LAB — TSH: TSH: 0.47 u[IU]/mL (ref 0.35–4.50)

## 2017-09-12 LAB — SEDIMENTATION RATE: Sed Rate: 22 mm/hr (ref 0–30)

## 2017-09-12 MED ORDER — TELMISARTAN-HCTZ 80-25 MG PO TABS: 1.0000 | ORAL_TABLET | Freq: Every day | ORAL | 3 refills | Status: DC

## 2017-09-12 MED ORDER — TIZANIDINE HCL 4 MG PO TABS: 4.0000 mg | ORAL_TABLET | Freq: Three times a day (TID) | ORAL | 1 refills | Status: DC | PRN

## 2017-09-12 NOTE — Assessment & Plan Note (Signed)
On Vit D 2000 iu/d Labs

## 2017-09-12 NOTE — Assessment & Plan Note (Signed)
D/c MS Labs

## 2017-09-12 NOTE — Patient Instructions (Signed)

## 2017-09-12 NOTE — Assessment & Plan Note (Signed)
Stretch the hip

## 2017-09-12 NOTE — Assessment & Plan Note (Signed)
Labs

## 2017-09-12 NOTE — Assessment & Plan Note (Signed)
  Femur MRI IMPRESSION: 1. Deep to the skin marker along the posterolateral right thigh, there is subtle stranding and nodularity in the subcutaneous fat, likely representing fat necrosis given history of prior trauma. No hematoma. 2. No acute osseous or soft tissue abnormality.  Stretch the hips

## 2017-09-12 NOTE — Assessment & Plan Note (Signed)
D/c exedrin

## 2017-09-12 NOTE — Progress Notes (Signed)
Subjective:  Patient ID: Karina Newman, female    DOB: 02/27/1956  Age: 62 y.o. MRN: [id number]  CC: No chief complaint on file.   HPI   Callaway Hardigree presents for fatigue C/o pain in the R leg/achy on the side  F/u HTN C/o HAs F/u chronic pain: Her first drug screen test on 08/17/2017 was incomplete due to insufficient amount of urine.  Her drug screen test on 08/24/2017 came back positive for amphetamines.  Amphetamines were not prescribed.  The patient states that she does not know how the amphetamines appeared in her urine test.  Acelyn is denying using illicit drugs.  Her drug screen test on 08/30/2017 was negative for amphetamines.   Outpatient Medications Prior to Visit  Medication Sig Dispense Refill  . amLODipine (NORVASC) 5 MG tablet Take 1 tablet (5 mg total) by mouth daily. 90 tablet 1  . amLODipine (NORVASC) 5 MG tablet TAKE 1 TABLET (5 MG TOTAL) BY MOUTH DAILY. 90 tablet 3  . Cholecalciferol (VITAMIN D3) 2000 units capsule Take 1 capsule (2,000 Units total) by mouth daily. 100 capsule 3  . Cyanocobalamin (VITAMIN B-12) 500 MCG SUBL 1 sl qd 100 tablet 5  . Diclofenac Sodium (PENNSAID) 1.5 % SOLN Place 0.3 mLs onto the skin 4 (four) times daily as needed. 150 mL 6  . dicyclomine (BENTYL) 10 MG capsule TAKE 1 CAPSULE (10 MG TOTAL) BY MOUTH EVERY 8 (EIGHT) HOURS AS NEEDED FOR SPASMS. 60 capsule 1  . fluticasone (FLONASE) 50 MCG/ACT nasal spray Place 1 spray into both nostrils as needed.     Marland Kitchen morphine (MS CONTIN) 30 MG 12 hr tablet Take 1 tablet (30 mg total) every 12 (twelve) hours by mouth. 60 tablet 0  . omeprazole (PRILOSEC) 40 MG capsule Take 1 capsule (40 mg total) by mouth daily. 90 capsule 3  . oxyCODONE (ROXICODONE) 15 MG immediate release tablet Take 1 tablet (15 mg total) 2 (two) times daily as needed by mouth for pain. 60 tablet 0  . Probiotic Product (VSL#3 DS) PACK Take 112.5 each by mouth 2 (two) times daily. 60 each 3  . pseudoephedrine (SUDAFED) 120 MG 12 hr  tablet Take 1 tablet (120 mg total) by mouth 2 (two) times daily as needed for congestion. 30 tablet 1  . SUMAtriptan (IMITREX) 100 MG tablet Take 1 tablet (100 mg total) by mouth every 2 (two) hours as needed for migraine. May repeat in 2 hours if headache persists or recurs. 12 tablet 5  . telmisartan-hydrochlorothiazide (MICARDIS HCT) 80-25 MG tablet TAKE 1 TABLET BY MOUTH EVERY DAY 90 tablet 2  . tiZANidine (ZANAFLEX) 4 MG tablet Take 1 tablet (4 mg total) by mouth every 8 (eight) hours as needed for muscle spasms. 60 tablet 1  . valACYclovir (VALTREX) 500 MG tablet Take 1 tablet (500 mg total) by mouth 2 (two) times daily. For 5 days with outbreak 30 tablet 1   Facility-Administered Medications Prior to Visit  Medication Dose Route Frequency Provider Last Rate Last Dose  . 0.9 %  sodium chloride infusion  500 mL Intravenous Continuous Armbruster, Carlota Raspberry, MD        ROS Review of Systems  Constitutional: Positive for fatigue. Negative for activity change, appetite change, chills and unexpected weight change.  HENT: Negative for congestion, mouth sores and sinus pressure.   Eyes: Negative for visual disturbance.  Respiratory: Negative for cough and chest tightness.   Gastrointestinal: Negative for abdominal pain and nausea.  Genitourinary: Negative for difficulty  urinating, frequency and vaginal pain.  Musculoskeletal: Positive for arthralgias, back pain, gait problem and neck pain.  Skin: Negative for pallor and rash.  Neurological: Positive for headaches. Negative for dizziness, tremors, weakness and numbness.  Psychiatric/Behavioral: Positive for dysphoric mood and sleep disturbance. Negative for confusion. The patient is nervous/anxious.     Objective:  BP 132/84 (BP Location: Left Arm, Patient Position: Sitting, Cuff Size: Large)   Pulse 83   Temp 98.4 F (36.9 C) (Oral)   Ht 5\' 4"  (1.626 m)   Wt 169 lb (76.7 kg)   SpO2 100%   BMI 29.01 kg/m   BP Readings from Last 3  Encounters:  09/12/17 132/84  08/17/17 (!) 146/88  06/27/17 130/80    Wt Readings from Last 3 Encounters:  09/12/17 169 lb (76.7 kg)  08/17/17 169 lb (76.7 kg)  06/27/17 170 lb (77.1 kg)    Physical Exam  Constitutional: She appears well-developed. No distress.  HENT:  Head: Normocephalic.  Right Ear: External ear normal.  Left Ear: External ear normal.  Nose: Nose normal.  Mouth/Throat: Oropharynx is clear and moist.  Eyes: Conjunctivae are normal. Pupils are equal, round, and reactive to light. Right eye exhibits no discharge. Left eye exhibits no discharge.  Neck: Normal range of motion. Neck supple. No JVD present. No tracheal deviation present. No thyromegaly present.  Cardiovascular: Normal rate, regular rhythm and normal heart sounds.  Pulmonary/Chest: No stridor. No respiratory distress. She has no wheezes.  Abdominal: Soft. Bowel sounds are normal. She exhibits no distension and no mass. There is no tenderness. There is no rebound and no guarding.  Musculoskeletal: She exhibits tenderness. She exhibits no edema.  Lymphadenopathy:    She has no cervical adenopathy.  Neurological: She displays normal reflexes. No cranial nerve deficit. She exhibits normal muscle tone. Coordination normal.  Skin: No rash noted. No erythema.  Psychiatric: She has a normal mood and affect. Her behavior is normal. Judgment and thought content normal.  Shoulders are tender R>>L Traps are tender and stiff B R>L Neck w/decr ROM B str leg elev (-) B R troch major is tender   Femur MRI IMPRESSION:  1. Deep to the skin marker along the posterolateral right thigh, there is subtle stranding and nodularity in the subcutaneous fat, likely representing fat necrosis given history of prior trauma. No hematoma. 2. No acute osseous or soft tissue abnormality.   Lab Results  Component Value Date   WBC 5.0 01/19/2017   HGB 13.4 01/19/2017   HCT 38.9 01/19/2017   PLT 329.0 01/19/2017   GLUCOSE  95 06/27/2017   CHOL 169 01/19/2017   TRIG 95.0 01/19/2017   HDL 44.70 01/19/2017   LDLCALC 105 (H) 01/19/2017   ALT 20 06/27/2017   AST 16 06/27/2017   NA 139 06/27/2017   K 4.0 06/27/2017   CL 102 06/27/2017   CREATININE 0.79 06/27/2017   BUN 11 06/27/2017   CO2 30 06/27/2017   TSH 0.57 01/19/2017    Mr Frmur Right Wo Contrast  Result Date: 03/14/2017 CLINICAL DATA:  Proximal femur pain after fall in February 2017. EXAM: MRI OF THE RIGHT FEMUR WITHOUT CONTRAST TECHNIQUE: Multiplanar, multisequence MR imaging of the right femur was performed. No intravenous contrast was administered. COMPARISON:  Right hip MRI dated January 29, 2017. FINDINGS: Bones/Joint/Cartilage No suspicious marrow signal abnormality. No fracture or dislocation. No hip joint effusion. Muscles and Tendons Normal muscle bulk and signal. No edema or fatty atrophy. The visualized gluteal, iliopsoas, and  hamstring tendons are intact. Soft tissues Deep to the skin marker along the posterolateral right thigh, there is subtle stranding and nodularity of the subcutaneous fat, likely reflecting fat necrosis. No abnormal fluid collection. No hematoma. IMPRESSION: 1. Deep to the skin marker along the posterolateral right thigh, there is subtle stranding and nodularity in the subcutaneous fat, likely representing fat necrosis given history of prior trauma. No hematoma. 2. No acute osseous or soft tissue abnormality. Electronically Signed   By: Titus Dubin M.D.   On: 03/14/2017 14:16    Assessment & Plan:   There are no diagnoses linked to this encounter. I am having Sheran Spine maintain her pseudoephedrine, Vitamin D3, Vitamin B-12, fluticasone, tiZANidine, telmisartan-hydrochlorothiazide, Diclofenac Sodium, SUMAtriptan, VSL#3 DS, valACYclovir, omeprazole, amLODipine, oxyCODONE, morphine, dicyclomine, and amLODipine. We will continue to administer sodium chloride.  No orders of the defined types were placed in this  encounter.    Follow-up: No Follow-up on file.  Walker Kehr, MD

## 2017-09-12 NOTE — Assessment & Plan Note (Signed)
ON MS qd -- discussed D/c MS

## 2017-09-12 NOTE — Assessment & Plan Note (Signed)
Amlodipine Micardis HCT 

## 2017-09-18 DIAGNOSIS — Z0279 Encounter for issue of other medical certificate: Secondary | ICD-10-CM

## 2017-09-18 NOTE — Assessment & Plan Note (Signed)
Her first drug screen test on 08/17/2017 was incomplete due to insufficient amount of urine.  Her drug screen test on 08/24/2017 came back positive for amphetamines.  Amphetamines were not prescribed.  The patient states that she does not know how the amphetamines appeared in her urine test.  Ziggy is denying using illicit drugs.  Her drug screen test on 08/30/2017 was negative for amphetamines.  We have discussed the situation at length.  This is the first time I had a problem.  Mallery agreed to discontinue a long-acting opioid.  We will continue with the oxycodone prescription for now.  Pain clinic appointment is pending.  Will monitor urine drug screen several times a year.

## 2017-09-21 ENCOUNTER — Ambulatory Visit (INDEPENDENT_AMBULATORY_CARE_PROVIDER_SITE_OTHER): Payer: Federal, State, Local not specified - PPO

## 2017-09-21 ENCOUNTER — Ambulatory Visit (HOSPITAL_COMMUNITY)
Admission: EM | Admit: 2017-09-21 | Discharge: 2017-09-21 | Disposition: A | Discharge status: Home / Self Care | Payer: Federal, State, Local not specified - PPO | Attending: Family Medicine | Admitting: Family Medicine

## 2017-09-21 ENCOUNTER — Encounter (HOSPITAL_COMMUNITY): Payer: Self-pay | Admitting: *Deleted

## 2017-09-21 DIAGNOSIS — R69 Illness, unspecified: Secondary | ICD-10-CM

## 2017-09-21 DIAGNOSIS — J111 Influenza due to unidentified influenza virus with other respiratory manifestations: Secondary | ICD-10-CM

## 2017-09-21 MED ORDER — HYDROCODONE-HOMATROPINE 5-1.5 MG/5ML PO SYRP: 5.0000 mL | ORAL_SOLUTION | Freq: Four times a day (QID) | ORAL | 0 refills | Status: DC | PRN

## 2017-09-21 NOTE — ED Provider Notes (Signed)
[place]   [id number] 09/21/17 Arrival Time: 0272  ASSESSMENT & PLAN:  1. Influenza-like illness    Meds ordered this encounter  Medications  . HYDROcodone-homatropine (HYCODAN) 5-1.5 MG/5ML syrup    Sig: Take 5 mLs by mouth every 6 (six) hours as needed for cough.    Dispense:  90 mL    Refill:  0   Cough medication sedation precautions. Discussed typical duration of symptoms. OTC symptom care as needed. Ensure adequate fluid intake and rest. May f/u with PCP or here as needed.  Reviewed expectations re: course of current medical issues. Questions answered. Outlined signs and symptoms indicating need for more acute intervention. Patient verbalized understanding. After Visit Summary given.   SUBJECTIVE: History from: patient.  Karina Newman is a 62 y.o. female who presents with complaint of nasal congestion, post-nasal drainage, and a persistent dry cough. Onset abrupt, approximately 4 days ago. Overall fatigued with body aches. SOB: none. Wheezing: none. Fever: yes, subjective. Overall normal PO intake without n/v. Sick contacts: no. OTC treatment: cough meds without much relief.  Received flu shot this year: no.  Social History   Tobacco Use  Smoking Status Never Smoker  Smokeless Tobacco Never Used    ROS: As per HPI.   OBJECTIVE:  Vitals:   09/21/17 1116  BP: (!) 153/79  Pulse: (!) 102  Resp: 17  Temp: 99.9 F (37.7 C)  TempSrc: Oral  SpO2: 100%     General appearance: alert; appears fatigued HEENT: nasal congestion; clear runny nose; throat irritation secondary to post-nasal drainage Neck: supple without LAD Lungs: unlabored respirations, symmetrical air entry; cough: moderate; no respiratory distress Skin: warm and dry Psychological: alert and cooperative; normal mood and affect  Imaging: Dg Chest 2 View  Result Date: 09/21/2017 CLINICAL DATA:  Fever and nonproductive cough. EXAM: CHEST  2 VIEW COMPARISON:  03/31/2015 FINDINGS:  The heart is at the upper limits of normal in size. Pulmonary vascularity is normal and the lungs are clear. No effusions. No significant bone abnormality. IMPRESSION: No acute abnormalities. Electronically Signed   By: Lorriane Shire M.D.   On: 09/21/2017 12:16    Allergies  Allergen Reactions  . Penicillins Hives  . Gabapentin     Nausea   . Iodine Other (See Comments)    Due to hyperthyroidism  . Lyrica [Pregabalin] Dermatitis  . Nsaids     gastritis  . Voltaren [Diclofenac] Hives    Past Medical History:  Diagnosis Date  . Abnormal cervical Pap smear with positive HPV DNA test 01/2014,01/2017   2015 Normal cytology with positive high-risk HPV 18/45. Colposcopy negative with negative ECC.  2018 normal cytology with positive high-risk HPV 18/45  . Anemia   . Autoimmune gastritis   . Fatty liver   . Gastritis   . Heart murmur   . Hypertension   . Hyperthyroidism   . Internal hemorrhoids   . Right shoulder pain 2001   chronic pain  . Thyroid disease    Hyperthyroid. Was on PTU (Dr Hampton Abbot in [place]) - stopped in 2013, stable  . Tubular adenoma of colon    Family History  Problem Relation Age of Onset  . Kidney disease Mother        ESRD  . Hypertension Mother   . Cancer Father        lung ca  . Hypertension Sister   . Diabetes Brother   . Diabetes Paternal Grandmother   . Colon cancer Neg Hx  Social History   Socioeconomic History  . Marital status: Married    Spouse name: Not on file  . Number of children: 4  . Years of education: Not on file  . Highest education level: Not on file  Social Needs  . Financial resource strain: Not on file  . Food insecurity - worry: Not on file  . Food insecurity - inability: Not on file  . Transportation needs - medical: Not on file  . Transportation needs - non-medical: Not on file  Occupational History  . Occupation: RETIRED  Tobacco Use  . Smoking status: Never Smoker  . Smokeless tobacco: Never Used  Substance and Sexual  Activity  . Alcohol use: No  . Drug use: No  . Sexual activity: Yes    Birth control/protection: Post-menopausal, Surgical    Comment: BTL-1st intercourse 16 yo-5 partners  Other Topics Concern  . Not on file  Social History Narrative  . Not on file           Vanessa Kick, MD 09/21/17 1227

## 2017-09-21 NOTE — ED Triage Notes (Addendum)
Patient reports cough, sore throat, nasal congestion, headache, and fever for approx 4 days. Patient has been taking OTC meds to help with symptoms.

## 2017-09-21 NOTE — Discharge Instructions (Signed)

## 2017-09-23 ENCOUNTER — Ambulatory Visit: Payer: Federal, State, Local not specified - PPO | Admitting: Internal Medicine

## 2017-09-23 ENCOUNTER — Encounter: Payer: Self-pay | Admitting: Internal Medicine

## 2017-09-23 VITALS — BP 100/80 | HR 108 | Temp 99.4°F | Ht 64.0 in | Wt 167.5 lb

## 2017-09-23 DIAGNOSIS — R6889 Other general symptoms and signs: Secondary | ICD-10-CM

## 2017-09-23 DIAGNOSIS — J069 Acute upper respiratory infection, unspecified: Secondary | ICD-10-CM | POA: Diagnosis not present

## 2017-09-23 DIAGNOSIS — E538 Deficiency of other specified B group vitamins: Secondary | ICD-10-CM

## 2017-09-23 LAB — POC INFLUENZA A&B (BINAX/QUICKVUE)
INFLUENZA B, POC: NEGATIVE
Influenza A, POC: NEGATIVE

## 2017-09-23 MED ORDER — AZITHROMYCIN 250 MG PO TABS: ORAL_TABLET | ORAL | 0 refills | Status: DC

## 2017-09-23 NOTE — Assessment & Plan Note (Addendum)
Flu test  Zpac if worse Tussionex Rx disposed at the request of the pt

## 2017-09-23 NOTE — Assessment & Plan Note (Signed)
On B12 

## 2017-09-23 NOTE — Progress Notes (Signed)
Subjective:  Patient ID: Karina Newman, female    DOB: November 22, 1955  Age: 62 y.o. MRN: [id number]  CC: No chief complaint on file.   HPI Karina Newman presents for URI sx's x 5 d. C/o [facility] T 101 Went to [facility] on Wed - given Tussionex - filled it w/o knowing - brings a bottle to dispose...  Outpatient Medications Prior to Visit  Medication Sig Dispense Refill  . amLODipine (NORVASC) 5 MG tablet Take 1 tablet (5 mg total) by mouth daily. 90 tablet 1  . amLODipine (NORVASC) 5 MG tablet TAKE 1 TABLET (5 MG TOTAL) BY MOUTH DAILY. 90 tablet 3  . Cyanocobalamin (VITAMIN B-12) 500 MCG SUBL 1 sl qd 100 tablet 5  . Diclofenac Sodium (PENNSAID) 1.5 % SOLN Place 0.3 mLs onto the skin 4 (four) times daily as needed. 150 mL 6  . dicyclomine (BENTYL) 10 MG capsule TAKE 1 CAPSULE (10 MG TOTAL) BY MOUTH EVERY 8 (EIGHT) HOURS AS NEEDED FOR SPASMS. 60 capsule 1  . fluticasone (FLONASE) 50 MCG/ACT nasal spray Place 1 spray into both nostrils as needed.     Marland Kitchen HYDROcodone-homatropine (HYCODAN) 5-1.5 MG/5ML syrup Take 5 mLs by mouth every 6 (six) hours as needed for cough. 90 mL 0  . omeprazole (PRILOSEC) 40 MG capsule Take 1 capsule (40 mg total) by mouth daily. 90 capsule 3  . oxyCODONE (ROXICODONE) 15 MG immediate release tablet Take 1 tablet (15 mg total) 2 (two) times daily as needed by mouth for pain. 60 tablet 0  . Probiotic Product (VSL#3 DS) PACK Take 112.5 each by mouth 2 (two) times daily. 60 each 3  . SUMAtriptan (IMITREX) 100 MG tablet Take 1 tablet (100 mg total) by mouth every 2 (two) hours as needed for migraine. May repeat in 2 hours if headache persists or recurs. 12 tablet 5  . telmisartan-hydrochlorothiazide (MICARDIS HCT) 80-25 MG tablet Take 1 tablet by mouth daily. 90 tablet 3  . tiZANidine (ZANAFLEX) 4 MG tablet Take 1 tablet (4 mg total) by mouth every 8 (eight) hours as needed for muscle spasms. 60 tablet 1  . valACYclovir (VALTREX) 500 MG tablet Take 1 tablet (500 mg total) by mouth 2 (two)  times daily. For 5 days with outbreak 30 tablet 1  . Cholecalciferol (VITAMIN D3) 2000 units capsule Take 1 capsule (2,000 Units total) by mouth daily. 100 capsule 3   Facility-Administered Medications Prior to Visit  Medication Dose Route Frequency Provider Last Rate Last Dose  . 0.9 %  sodium chloride infusion  500 mL Intravenous Continuous Newman, Karina Raspberry, MD        ROS Review of Systems  Constitutional: Positive for chills, fatigue and fever. Negative for activity change, appetite change and unexpected weight change.  HENT: Positive for rhinorrhea, sinus pressure and sore throat. Negative for congestion and mouth sores.   Eyes: Negative for visual disturbance.  Respiratory: Positive for cough. Negative for chest tightness.   Gastrointestinal: Negative for abdominal pain and nausea.  Genitourinary: Negative for difficulty urinating, frequency and vaginal pain.  Musculoskeletal: Negative for back pain and gait problem.  Skin: Negative for pallor and rash.  Neurological: Negative for dizziness, tremors, weakness, numbness and headaches.  Psychiatric/Behavioral: Negative for confusion and sleep disturbance.    Objective:  BP 100/80 (BP Location: Left Arm, Patient Position: Sitting, Cuff Size: Normal)   Pulse (!) 108   Temp 99.4 F (37.4 C) (Oral)   Ht 5\' 4"  (1.626 m)   Wt 167 lb 8  oz (76 kg)   SpO2 98%   BMI 28.75 kg/m   BP Readings from Last 3 Encounters:  09/23/17 100/80  09/21/17 (!) 153/79  09/12/17 132/84    Wt Readings from Last 3 Encounters:  09/23/17 167 lb 8 oz (76 kg)  09/12/17 169 lb (76.7 kg)  08/17/17 169 lb (76.7 kg)    Physical Exam  Constitutional: She appears well-developed. No distress.  HENT:  Head: Normocephalic.  Right Ear: External ear normal.  Left Ear: External ear normal.  Nose: Nose normal.  Mouth/Throat: Oropharynx is clear and moist.  Eyes: Conjunctivae are normal. Pupils are equal, round, and reactive to light. Right eye exhibits  no discharge. Left eye exhibits no discharge.  Neck: Normal range of motion. Neck supple. No JVD present. No tracheal deviation present. No thyromegaly present.  Cardiovascular: Normal rate, regular rhythm and normal heart sounds.  Pulmonary/Chest: No stridor. No respiratory distress. She has no wheezes.  Abdominal: Soft. Bowel sounds are normal. She exhibits no distension and no mass. There is no tenderness. There is no rebound and no guarding.  Musculoskeletal: She exhibits no edema or tenderness.  Lymphadenopathy:    She has no cervical adenopathy.  Neurological: She displays normal reflexes. No cranial nerve deficit. She exhibits normal muscle tone. Coordination normal.  Skin: No rash noted. No erythema.  Psychiatric: She has a normal mood and affect. Her behavior is normal. Judgment and thought content normal.  eryth throat  Lab Results  Component Value Date   WBC 5.1 09/12/2017   HGB 13.6 09/12/2017   HCT 40.5 09/12/2017   PLT 396.0 09/12/2017   GLUCOSE 106 (H) 09/12/2017   CHOL 169 01/19/2017   TRIG 95.0 01/19/2017   HDL 44.70 01/19/2017   LDLCALC 105 (H) 01/19/2017   ALT 26 09/12/2017   AST 18 09/12/2017   NA 136 09/12/2017   K 3.8 09/12/2017   CL 98 09/12/2017   CREATININE 0.88 09/12/2017   BUN 12 09/12/2017   CO2 31 09/12/2017   TSH 0.47 09/12/2017    Dg Chest 2 View  Result Date: 09/21/2017 CLINICAL DATA:  Fever and nonproductive cough. EXAM: CHEST  2 VIEW COMPARISON:  03/31/2015 FINDINGS: The heart is at the upper limits of normal in size. Pulmonary vascularity is normal and the lungs are clear. No effusions. No significant bone abnormality. IMPRESSION: No acute abnormalities. Electronically Signed   By: Karina Newman M.D.   On: 09/21/2017 12:16    Assessment & Plan:   There are no diagnoses linked to this encounter. I have discontinued Karina Newman's Vitamin D3. I am also having her maintain her Vitamin B-12, fluticasone, Diclofenac Sodium, SUMAtriptan, VSL#3  DS, valACYclovir, omeprazole, amLODipine, oxyCODONE, dicyclomine, amLODipine, tiZANidine, telmisartan-hydrochlorothiazide, and HYDROcodone-homatropine. We will continue to administer sodium chloride.  No orders of the defined types were placed in this encounter.    Follow-up: No Follow-up on file.  Walker Kehr, MD

## 2017-09-23 NOTE — Patient Instructions (Signed)
You can use over-the-counter  "cold" medicines  such as "Tylenol cold" , "Advil cold",  "Mucinex" or" Mucinex D"  for cough and congestion.   Avoid decongestants if you have high blood pressure and use "Afrin" nasal spray for nasal congestion as directed. Use " Delsym" or" Robitussin" cough syrup varietis for cough.  You can use plain "Tylenol" or "Advil" for fever, chills and achyness. Use Halls or Ricola cough drops.   "Common cold" symptoms are usually triggered by a virus.  The antibiotics are usually not necessary. On average, a" viral cold" illness would take 4-7 days to resolve.   Please, make an appointment if you are not better or if you're worse.  

## 2017-09-27 ENCOUNTER — Ambulatory Visit (INDEPENDENT_AMBULATORY_CARE_PROVIDER_SITE_OTHER): Admitting: Internal Medicine

## 2017-09-27 ENCOUNTER — Encounter: Payer: Self-pay | Admitting: Internal Medicine

## 2017-09-27 VITALS — BP 128/80 | HR 78 | Temp 98.5°F | Wt 168.0 lb

## 2017-09-27 DIAGNOSIS — S43439S Superior glenoid labrum lesion of unspecified shoulder, sequela: Secondary | ICD-10-CM | POA: Diagnosis not present

## 2017-09-27 DIAGNOSIS — J069 Acute upper respiratory infection, unspecified: Secondary | ICD-10-CM | POA: Diagnosis not present

## 2017-09-27 MED ORDER — OXYCODONE HCL 15 MG PO TABS: 15.0000 mg | ORAL_TABLET | Freq: Two times a day (BID) | ORAL | 0 refills | Status: DC | PRN

## 2017-09-27 NOTE — Assessment & Plan Note (Addendum)
Oxy Rx for Feb RTC in 1 mo w/UDS prior - close surveillance  We will check on the Pain Clinic ref state

## 2017-09-27 NOTE — Assessment & Plan Note (Signed)
Viral Improving

## 2017-09-27 NOTE — Progress Notes (Addendum)
Subjective:  Patient ID: Karina Newman, female    DOB: 07-Jul-1956  Age: 62 y.o. MRN: [id number]  CC: No chief complaint on file.   HPI Kynsli Haapala presents for chronic pain f/u F/u URI - better  Outpatient Medications Prior to Visit  Medication Sig Dispense Refill  . amLODipine (NORVASC) 5 MG tablet Take 1 tablet (5 mg total) by mouth daily. 90 tablet 1  . amLODipine (NORVASC) 5 MG tablet TAKE 1 TABLET (5 MG TOTAL) BY MOUTH DAILY. 90 tablet 3  . azithromycin (ZITHROMAX Z-PAK) 250 MG tablet As directed 6 each 0  . Cyanocobalamin (VITAMIN B-12) 500 MCG SUBL 1 sl qd 100 tablet 5  . Diclofenac Sodium (PENNSAID) 1.5 % SOLN Place 0.3 mLs onto the skin 4 (four) times daily as needed. 150 mL 6  . dicyclomine (BENTYL) 10 MG capsule TAKE 1 CAPSULE (10 MG TOTAL) BY MOUTH EVERY 8 (EIGHT) HOURS AS NEEDED FOR SPASMS. 60 capsule 1  . fluticasone (FLONASE) 50 MCG/ACT nasal spray Place 1 spray into both nostrils as needed.     Marland Kitchen HYDROcodone-homatropine (HYCODAN) 5-1.5 MG/5ML syrup Take 5 mLs by mouth every 6 (six) hours as needed for cough. 90 mL 0  . omeprazole (PRILOSEC) 40 MG capsule Take 1 capsule (40 mg total) by mouth daily. 90 capsule 3  . oxyCODONE (ROXICODONE) 15 MG immediate release tablet Take 1 tablet (15 mg total) 2 (two) times daily as needed by mouth for pain. 60 tablet 0  . Probiotic Product (VSL#3 DS) PACK Take 112.5 each by mouth 2 (two) times daily. 60 each 3  . SUMAtriptan (IMITREX) 100 MG tablet Take 1 tablet (100 mg total) by mouth every 2 (two) hours as needed for migraine. May repeat in 2 hours if headache persists or recurs. 12 tablet 5  . telmisartan-hydrochlorothiazide (MICARDIS HCT) 80-25 MG tablet Take 1 tablet by mouth daily. 90 tablet 3  . tiZANidine (ZANAFLEX) 4 MG tablet Take 1 tablet (4 mg total) by mouth every 8 (eight) hours as needed for muscle spasms. 60 tablet 1  . valACYclovir (VALTREX) 500 MG tablet Take 1 tablet (500 mg total) by mouth 2 (two) times daily. For  5 days with outbreak 30 tablet 1   Facility-Administered Medications Prior to Visit  Medication Dose Route Frequency Provider Last Rate Last Dose  . 0.9 %  sodium chloride infusion  500 mL Intravenous Continuous Armbruster, Carlota Raspberry, MD        ROS Review of Systems  Constitutional: Negative for activity change, appetite change, chills, fatigue and unexpected weight change.  HENT: Positive for congestion and rhinorrhea. Negative for mouth sores and sinus pressure.   Eyes: Negative for visual disturbance.  Respiratory: Negative for cough and chest tightness.   Gastrointestinal: Negative for abdominal pain and nausea.  Genitourinary: Negative for difficulty urinating, frequency and vaginal pain.  Musculoskeletal: Positive for arthralgias. Negative for back pain and gait problem.  Skin: Negative for pallor and rash.  Neurological: Negative for dizziness, tremors, weakness, numbness and headaches.  Psychiatric/Behavioral: Negative for confusion and sleep disturbance.    Objective:  BP 128/80   Pulse 78   Temp 98.5 F (36.9 C)   Wt 168 lb (76.2 kg)   SpO2 97%   BMI 28.84 kg/m   BP Readings from Last 3 Encounters:  09/27/17 128/80  09/23/17 100/80  09/21/17 (!) 153/79    Wt Readings from Last 3 Encounters:  09/27/17 168 lb (76.2 kg)  09/23/17 167 lb 8 oz (76 kg)  09/12/17 169 lb (76.7 kg)    Physical Exam  Constitutional: She appears well-developed. No distress.  HENT:  Head: Normocephalic.  Right Ear: External ear normal.  Left Ear: External ear normal.  Nose: Nose normal.  Mouth/Throat: Oropharynx is clear and moist.  Eyes: Conjunctivae are normal. Pupils are equal, round, and reactive to light. Right eye exhibits no discharge. Left eye exhibits no discharge.  Neck: Normal range of motion. Neck supple. No JVD present. No tracheal deviation present. No thyromegaly present.  Cardiovascular: Normal rate, regular rhythm and normal heart sounds.  Pulmonary/Chest: No  stridor. No respiratory distress. She has no wheezes.  Abdominal: Soft. Bowel sounds are normal. She exhibits no distension and no mass. There is no tenderness. There is no rebound and no guarding.  Musculoskeletal: She exhibits tenderness. She exhibits no edema.  Lymphadenopathy:    She has no cervical adenopathy.  Neurological: She displays normal reflexes. No cranial nerve deficit. She exhibits normal muscle tone. Coordination normal.  Skin: No rash noted. No erythema.  Psychiatric: She has a normal mood and affect. Her behavior is normal. Judgment and thought content normal.   R shoulder is tender w/ROM  Lab Results  Component Value Date   WBC 5.1 09/12/2017   HGB 13.6 09/12/2017   HCT 40.5 09/12/2017   PLT 396.0 09/12/2017   GLUCOSE 106 (H) 09/12/2017   CHOL 169 01/19/2017   TRIG 95.0 01/19/2017   HDL 44.70 01/19/2017   LDLCALC 105 (H) 01/19/2017   ALT 26 09/12/2017   AST 18 09/12/2017   NA 136 09/12/2017   K 3.8 09/12/2017   CL 98 09/12/2017   CREATININE 0.88 09/12/2017   BUN 12 09/12/2017   CO2 31 09/12/2017   TSH 0.47 09/12/2017    Dg Chest 2 View  Result Date: 09/21/2017 CLINICAL DATA:  Fever and nonproductive cough. EXAM: CHEST  2 VIEW COMPARISON:  03/31/2015 FINDINGS: The heart is at the upper limits of normal in size. Pulmonary vascularity is normal and the lungs are clear. No effusions. No significant bone abnormality. IMPRESSION: No acute abnormalities. Electronically Signed   By: Lorriane Shire M.D.   On: 09/21/2017 12:16    Assessment & Plan:   There are no diagnoses linked to this encounter. I am having Sheran Spine maintain her Vitamin B-12, fluticasone, Diclofenac Sodium, SUMAtriptan, VSL#3 DS, valACYclovir, omeprazole, amLODipine, oxyCODONE, dicyclomine, amLODipine, tiZANidine, telmisartan-hydrochlorothiazide, HYDROcodone-homatropine, and azithromycin. We will continue to administer sodium chloride.  No orders of the defined types were placed in this  encounter.    Follow-up: No Follow-up on file.  Walker Kehr, MD

## 2017-10-17 ENCOUNTER — Other Ambulatory Visit: Payer: Self-pay | Admitting: Internal Medicine

## 2017-10-21 ENCOUNTER — Encounter: Payer: Self-pay | Admitting: Internal Medicine

## 2017-10-21 ENCOUNTER — Ambulatory Visit (INDEPENDENT_AMBULATORY_CARE_PROVIDER_SITE_OTHER): Admitting: Internal Medicine

## 2017-10-21 ENCOUNTER — Other Ambulatory Visit

## 2017-10-21 ENCOUNTER — Ambulatory Visit (INDEPENDENT_AMBULATORY_CARE_PROVIDER_SITE_OTHER)
Admission: RE | Admit: 2017-10-21 | Discharge: 2017-10-21 | Disposition: A | Discharge status: Home / Self Care | Payer: Federal, State, Local not specified - PPO | Source: Ambulatory Visit | Attending: Internal Medicine | Admitting: Internal Medicine

## 2017-10-21 DIAGNOSIS — S43439S Superior glenoid labrum lesion of unspecified shoulder, sequela: Secondary | ICD-10-CM | POA: Diagnosis not present

## 2017-10-21 DIAGNOSIS — R0789 Other chest pain: Secondary | ICD-10-CM

## 2017-10-21 DIAGNOSIS — E538 Deficiency of other specified B group vitamins: Secondary | ICD-10-CM | POA: Diagnosis not present

## 2017-10-21 MED ORDER — CYCLOBENZAPRINE HCL 5 MG PO TABS: 5.0000 mg | ORAL_TABLET | Freq: Three times a day (TID) | ORAL | 1 refills | Status: DC | PRN

## 2017-10-21 MED ORDER — OXYCODONE HCL 15 MG PO TABS: 15.0000 mg | ORAL_TABLET | Freq: Two times a day (BID) | ORAL | 0 refills | Status: DC | PRN

## 2017-10-21 NOTE — Assessment & Plan Note (Signed)
R side CXR D dimer

## 2017-10-21 NOTE — Patient Instructions (Addendum)
To ER if worse Xarelto 15 mg twice a day if elevated D dimer test

## 2017-10-21 NOTE — Assessment & Plan Note (Signed)
Off MS On Oxy  Potential benefits of a long term opioids use as well as potential risks (i.e. addiction risk, apnea etc) and complications (i.e. Somnolence, constipation and others) were explained to the patient and were aknowledged.

## 2017-10-21 NOTE — Progress Notes (Signed)
Subjective:  Patient ID: Karina Newman, female    DOB: 1956-07-10  Age: 62 y.o. MRN: [id number]  CC: No chief complaint on file.   HPI [name] presents for chronic pain C/o severe R shoulder pain irrad around R chest to the front x weeks. Worse w/ROM, breathing. No recent travel. Sister, nephew had clots... F/u HTN  Outpatient Medications Prior to Visit  Medication Sig Dispense Refill  . amLODipine (NORVASC) 5 MG tablet Take 1 tablet (5 mg total) by mouth daily. 90 tablet 1  . amLODipine (NORVASC) 5 MG tablet TAKE 1 TABLET (5 MG TOTAL) BY MOUTH DAILY. 90 tablet 3  . azithromycin (ZITHROMAX Z-PAK) 250 MG tablet As directed 6 each 0  . Cyanocobalamin (VITAMIN B-12) 500 MCG SUBL 1 sl qd 100 tablet 5  . Diclofenac Sodium 1.5 % SOLN PLACE 0.3MLS ONTO THE SKIN 4 TIMES DAILY AS NEEDED 150 mL 2  . dicyclomine (BENTYL) 10 MG capsule TAKE 1 CAPSULE (10 MG TOTAL) BY MOUTH EVERY 8 (EIGHT) HOURS AS NEEDED FOR SPASMS. 60 capsule 1  . fluticasone (FLONASE) 50 MCG/ACT nasal spray Place 1 spray into both nostrils as needed.     Marland Kitchen omeprazole (PRILOSEC) 40 MG capsule Take 1 capsule (40 mg total) by mouth daily. 90 capsule 3  . oxyCODONE (ROXICODONE) 15 MG immediate release tablet Take 1 tablet (15 mg total) by mouth 2 (two) times daily as needed for pain. 60 tablet 0  . Probiotic Product (VSL#3 DS) PACK Take 112.5 each by mouth 2 (two) times daily. 60 each 3  . SUMAtriptan (IMITREX) 100 MG tablet Take 1 tablet (100 mg total) by mouth every 2 (two) hours as needed for migraine. May repeat in 2 hours if headache persists or recurs. 12 tablet 5  . telmisartan-hydrochlorothiazide (MICARDIS HCT) 80-25 MG tablet Take 1 tablet by mouth daily. 90 tablet 3  . tiZANidine (ZANAFLEX) 4 MG tablet Take 1 tablet (4 mg total) by mouth every 8 (eight) hours as needed for muscle spasms. 60 tablet 1  . valACYclovir (VALTREX) 500 MG tablet Take 1 tablet (500 mg total) by mouth 2 (two) times daily. For 5 days with  outbreak 30 tablet 1   Facility-Administered Medications Prior to Visit  Medication Dose Route Frequency Provider Last Rate Last Dose  . 0.9 %  sodium chloride infusion  500 mL Intravenous Continuous Armbruster, Carlota Raspberry, MD        ROS Review of Systems  Constitutional: Negative for activity change, appetite change, chills, fatigue and unexpected weight change.  HENT: Negative for congestion, mouth sores and sinus pressure.   Eyes: Negative for visual disturbance.  Respiratory: Negative for cough and chest tightness.   Gastrointestinal: Negative for abdominal pain and nausea.  Genitourinary: Negative for difficulty urinating, frequency and vaginal pain.  Musculoskeletal: Negative for back pain and gait problem.  Skin: Negative for pallor and rash.  Neurological: Negative for dizziness, tremors, weakness, numbness and headaches.  Psychiatric/Behavioral: Negative for confusion and sleep disturbance.    Objective:  BP 138/84 (BP Location: Left Arm, Patient Position: Sitting, Cuff Size: Large)   Pulse 78   Temp 97.8 F (36.6 C) (Oral)   Ht 5\' 4"  (1.626 m)   Wt 170 lb (77.1 kg)   SpO2 99%   BMI 29.18 kg/m   BP Readings from Last 3 Encounters:  10/21/17 138/84  09/27/17 128/80  09/23/17 100/80    Wt Readings from Last 3 Encounters:  10/21/17 170 lb (77.1 kg)  09/27/17 168  lb (76.2 kg)  09/23/17 167 lb 8 oz (76 kg)    Physical Exam  Constitutional: She appears well-developed. No distress.  HENT:  Head: Normocephalic.  Right Ear: External ear normal.  Left Ear: External ear normal.  Nose: Nose normal.  Mouth/Throat: Oropharynx is clear and moist.  Eyes: Conjunctivae are normal. Pupils are equal, round, and reactive to light. Right eye exhibits no discharge. Left eye exhibits no discharge.  Neck: Normal range of motion. Neck supple. No JVD present. No tracheal deviation present. No thyromegaly present.  Cardiovascular: Normal rate, regular rhythm and normal heart sounds.    Pulmonary/Chest: No stridor. No respiratory distress. She has no wheezes. She exhibits no tenderness.  Abdominal: Soft. Bowel sounds are normal. She exhibits no distension and no mass. There is no tenderness. There is no rebound and no guarding.  Musculoskeletal: She exhibits tenderness. She exhibits no edema.  Lymphadenopathy:    She has no cervical adenopathy.  Neurological: She displays normal reflexes. No cranial nerve deficit. She exhibits normal muscle tone. Coordination normal.  Skin: No rash noted. No erythema.  Psychiatric: She has a normal mood and affect. Her behavior is normal. Judgment and thought content normal.  R shoulder is painful, R neck - tender  Lab Results  Component Value Date   WBC 5.1 09/12/2017   HGB 13.6 09/12/2017   HCT 40.5 09/12/2017   PLT 396.0 09/12/2017   GLUCOSE 106 (H) 09/12/2017   CHOL 169 01/19/2017   TRIG 95.0 01/19/2017   HDL 44.70 01/19/2017   LDLCALC 105 (H) 01/19/2017   ALT 26 09/12/2017   AST 18 09/12/2017   NA 136 09/12/2017   K 3.8 09/12/2017   CL 98 09/12/2017   CREATININE 0.88 09/12/2017   BUN 12 09/12/2017   CO2 31 09/12/2017   TSH 0.47 09/12/2017    Dg Chest 2 View  Result Date: 09/21/2017 CLINICAL DATA:  Fever and nonproductive cough. EXAM: CHEST  2 VIEW COMPARISON:  03/31/2015 FINDINGS: The heart is at the upper limits of normal in size. Pulmonary vascularity is normal and the lungs are clear. No effusions. No significant bone abnormality. IMPRESSION: No acute abnormalities. Electronically Signed   By: Lorriane Shire M.D.   On: 09/21/2017 12:16    Assessment & Plan:   There are no diagnoses linked to this encounter. I am having Sheran Spine maintain her Vitamin B-12, fluticasone, SUMAtriptan, VSL#3 DS, valACYclovir, omeprazole, amLODipine, dicyclomine, amLODipine, tiZANidine, telmisartan-hydrochlorothiazide, azithromycin, oxyCODONE, and Diclofenac Sodium. We will continue to administer sodium chloride.  No orders of the  defined types were placed in this encounter.    Follow-up: No Follow-up on file.  Walker Kehr, MD

## 2017-10-21 NOTE — Assessment & Plan Note (Signed)
On B12 

## 2017-10-22 LAB — D-DIMER, QUANTITATIVE (NOT AT ARMC): D DIMER QUANT: 0.43 ug{FEU}/mL (ref <0.50)

## 2017-11-01 ENCOUNTER — Telehealth: Payer: Self-pay | Admitting: Internal Medicine

## 2017-11-01 NOTE — Telephone Encounter (Signed)
I spoke with pt regarding her pain mgmt referral. We are unable to find anyone who will take workers comp except for [facility] and they have denied seeing her. She states the dept of Labor sent a report and she also brought a copy of this in that needs to be filled out stating we were unable to find pain mgmt for her.

## 2017-11-26 ENCOUNTER — Other Ambulatory Visit: Payer: Self-pay | Admitting: Internal Medicine

## 2017-11-30 ENCOUNTER — Encounter: Payer: Self-pay | Admitting: Internal Medicine

## 2017-11-30 ENCOUNTER — Ambulatory Visit (INDEPENDENT_AMBULATORY_CARE_PROVIDER_SITE_OTHER): Payer: Federal, State, Local not specified - PPO | Admitting: Internal Medicine

## 2017-11-30 DIAGNOSIS — G43009 Migraine without aura, not intractable, without status migrainosus: Secondary | ICD-10-CM | POA: Diagnosis not present

## 2017-11-30 MED ORDER — CYCLOBENZAPRINE HCL 5 MG PO TABS: 5.0000 mg | ORAL_TABLET | Freq: Three times a day (TID) | ORAL | 1 refills | Status: DC | PRN

## 2017-11-30 MED ORDER — FLUTICASONE PROPIONATE 50 MCG/ACT NA SUSP: 1.0000 | NASAL | 11 refills | Status: DC | PRN

## 2017-11-30 MED ORDER — TRAMADOL HCL 50 MG PO TABS: 50.0000 mg | ORAL_TABLET | Freq: Four times a day (QID) | ORAL | 0 refills | Status: DC | PRN

## 2017-11-30 NOTE — Progress Notes (Signed)
Subjective:  Patient ID: Karina Newman, female    DOB: 02/24/1956  Age: 62 y.o. MRN: [id number]  CC: No chief complaint on file.   HPI Karina Newman presents for chronic pain, muscle spasms Pain is 6-7/10 in the R shoulder  Outpatient Medications Prior to Visit  Medication Sig Dispense Refill  . amLODipine (NORVASC) 5 MG tablet TAKE 1 TABLET (5 MG TOTAL) BY MOUTH DAILY. 90 tablet 3  . Cyanocobalamin (VITAMIN B-12) 500 MCG SUBL 1 sl qd 100 tablet 5  . cyclobenzaprine (FLEXERIL) 5 MG tablet Take 1 tablet (5 mg total) by mouth 3 (three) times daily as needed for muscle spasms. 60 tablet 1  . Diclofenac Sodium 1.5 % SOLN PLACE 0.3MLS ONTO THE SKIN 4 TIMES DAILY AS NEEDED 150 mL 2  . dicyclomine (BENTYL) 10 MG capsule TAKE 1 CAPSULE (10 MG TOTAL) BY MOUTH EVERY 8 (EIGHT) HOURS AS NEEDED FOR SPASMS. 60 capsule 0  . fluticasone (FLONASE) 50 MCG/ACT nasal spray Place 1 spray into both nostrils as needed.     Marland Kitchen omeprazole (PRILOSEC) 40 MG capsule Take 1 capsule (40 mg total) by mouth daily. 90 capsule 3  . oxyCODONE (ROXICODONE) 15 MG immediate release tablet Take 1 tablet (15 mg total) by mouth 2 (two) times daily as needed for pain. 60 tablet 0  . Probiotic Product (VSL#3 DS) PACK Take 112.5 each by mouth 2 (two) times daily. 60 each 3  . SUMAtriptan (IMITREX) 100 MG tablet Take 1 tablet (100 mg total) by mouth every 2 (two) hours as needed for migraine. May repeat in 2 hours if headache persists or recurs. 12 tablet 5  . telmisartan-hydrochlorothiazide (MICARDIS HCT) 80-25 MG tablet Take 1 tablet by mouth daily. 90 tablet 3  . valACYclovir (VALTREX) 500 MG tablet Take 1 tablet (500 mg total) by mouth 2 (two) times daily. For 5 days with outbreak 30 tablet 1   Facility-Administered Medications Prior to Visit  Medication Dose Route Frequency Provider Last Rate Last Dose  . 0.9 %  sodium chloride infusion  500 mL Intravenous Continuous Armbruster, Carlota Raspberry, MD        ROS Review of Systems    Constitutional: Negative for activity change, appetite change, chills, fatigue and unexpected weight change.  HENT: Negative for congestion, mouth sores and sinus pressure.   Eyes: Negative for visual disturbance.  Respiratory: Negative for cough and chest tightness.   Gastrointestinal: Negative for abdominal pain and nausea.  Genitourinary: Negative for difficulty urinating, frequency and vaginal pain.  Musculoskeletal: Positive for arthralgias, back pain and neck pain. Negative for gait problem.  Skin: Negative for pallor and rash.  Neurological: Negative for dizziness, tremors, weakness, numbness and headaches.  Psychiatric/Behavioral: Negative for confusion and sleep disturbance. The patient is nervous/anxious.     Objective:  BP (!) 142/82 (BP Location: Left Arm, Patient Position: Sitting, Cuff Size: Normal)   Pulse 91   Temp 98.4 F (36.9 C) (Oral)   Ht 5\' 4"  (1.626 m)   Wt 170 lb (77.1 kg)   SpO2 98%   BMI 29.18 kg/m   BP Readings from Last 3 Encounters:  11/30/17 (!) 142/82  10/21/17 138/84  09/27/17 128/80    Wt Readings from Last 3 Encounters:  11/30/17 170 lb (77.1 kg)  10/21/17 170 lb (77.1 kg)  09/27/17 168 lb (76.2 kg)    Physical Exam  Constitutional: She appears well-developed. No distress.  HENT:  Head: Normocephalic.  Right Ear: External ear normal.  Left Ear: External  ear normal.  Nose: Nose normal.  Mouth/Throat: Oropharynx is clear and moist.  Eyes: Pupils are equal, round, and reactive to light. Conjunctivae are normal. Right eye exhibits no discharge. Left eye exhibits no discharge.  Neck: Normal range of motion. Neck supple. No JVD present. No tracheal deviation present. No thyromegaly present.  Cardiovascular: Normal rate, regular rhythm and normal heart sounds.  Pulmonary/Chest: No stridor. No respiratory distress. She has no wheezes.  Abdominal: Soft. Bowel sounds are normal. She exhibits no distension and no mass. There is no tenderness.  There is no rebound and no guarding.  Musculoskeletal: She exhibits no edema or tenderness.  Lymphadenopathy:    She has no cervical adenopathy.  Neurological: She displays normal reflexes. No cranial nerve deficit. She exhibits normal muscle tone. Coordination normal.  Skin: No rash noted. No erythema.  Psychiatric: She has a normal mood and affect. Her behavior is normal. Judgment and thought content normal.    Lab Results  Component Value Date   WBC 5.1 09/12/2017   HGB 13.6 09/12/2017   HCT 40.5 09/12/2017   PLT 396.0 09/12/2017   GLUCOSE 106 (H) 09/12/2017   CHOL 169 01/19/2017   TRIG 95.0 01/19/2017   HDL 44.70 01/19/2017   LDLCALC 105 (H) 01/19/2017   ALT 26 09/12/2017   AST 18 09/12/2017   NA 136 09/12/2017   K 3.8 09/12/2017   CL 98 09/12/2017   CREATININE 0.88 09/12/2017   BUN 12 09/12/2017   CO2 31 09/12/2017   TSH 0.47 09/12/2017    Dg Chest 2 View  Result Date: 10/21/2017 CLINICAL DATA:  Interval chest pain EXAM: CHEST - 2 VIEW COMPARISON:  09/21/2017 FINDINGS: The heart size and mediastinal contours are within normal limits. Both lungs are clear. The visualized skeletal structures are unremarkable. IMPRESSION: No active cardiopulmonary disease. Electronically Signed   By: Kathreen Devoid   On: 10/21/2017 15:21    Assessment & Plan:   There are no diagnoses linked to this encounter. I am having Sheran Spine maintain her Vitamin B-12, fluticasone, SUMAtriptan, VSL#3 DS, valACYclovir, omeprazole, amLODipine, telmisartan-hydrochlorothiazide, Diclofenac Sodium, oxyCODONE, cyclobenzaprine, and dicyclomine. We will continue to administer sodium chloride.  No orders of the defined types were placed in this encounter.    Follow-up: No follow-ups on file.  Walker Kehr, MD

## 2017-11-30 NOTE — Patient Instructions (Signed)
CBD oil dose is 20-25 mg of CBD twice a day

## 2017-11-30 NOTE — Assessment & Plan Note (Signed)
Imitrex, Zofran 

## 2017-12-30 ENCOUNTER — Other Ambulatory Visit: Payer: Self-pay | Admitting: Internal Medicine

## 2018-01-27 ENCOUNTER — Other Ambulatory Visit: Payer: Self-pay | Admitting: Internal Medicine

## 2018-02-03 ENCOUNTER — Encounter: Payer: Self-pay | Admitting: Internal Medicine

## 2018-02-03 ENCOUNTER — Ambulatory Visit (INDEPENDENT_AMBULATORY_CARE_PROVIDER_SITE_OTHER): Payer: Federal, State, Local not specified - PPO | Admitting: Internal Medicine

## 2018-02-03 DIAGNOSIS — R5383 Other fatigue: Secondary | ICD-10-CM | POA: Diagnosis not present

## 2018-02-03 DIAGNOSIS — S43439S Superior glenoid labrum lesion of unspecified shoulder, sequela: Secondary | ICD-10-CM

## 2018-02-03 DIAGNOSIS — E538 Deficiency of other specified B group vitamins: Secondary | ICD-10-CM | POA: Diagnosis not present

## 2018-02-03 DIAGNOSIS — R609 Edema, unspecified: Secondary | ICD-10-CM

## 2018-02-03 MED ORDER — SUCRALFATE 1 G PO TABS: ORAL_TABLET | ORAL | 11 refills | Status: DC

## 2018-02-03 MED ORDER — DICLOFENAC SODIUM 1.5 % TD SOLN: TRANSDERMAL | 2 refills | Status: DC

## 2018-02-03 MED ORDER — RESTORA PO CAPS: ORAL_CAPSULE | ORAL | 11 refills | Status: DC

## 2018-02-03 MED ORDER — TRAMADOL HCL 50 MG PO TABS: 50.0000 mg | ORAL_TABLET | Freq: Four times a day (QID) | ORAL | 2 refills | Status: DC | PRN

## 2018-02-03 NOTE — Assessment & Plan Note (Signed)
Chronic pain Tramadol  Potential benefits of a long term Tramadol  use as well as potential risks  and complications were explained to the patient and were aknowledged.

## 2018-02-03 NOTE — Assessment & Plan Note (Signed)
Doing fair 

## 2018-02-03 NOTE — Assessment & Plan Note (Signed)
On B12 

## 2018-02-03 NOTE — Progress Notes (Signed)
Subjective:  Patient ID: Karina Newman, female    DOB: 03-05-1956  Age: 62 y.o. MRN: [id number]  CC: No chief complaint on file.   HPI Karina Newman presents for chronic pain C/o pain in the R arm. Pt put R hand in cold ground meat - pain shot up to her shoulder and head. Cold water does it too...  Outpatient Medications Prior to Visit  Medication Sig Dispense Refill  . amLODipine (NORVASC) 5 MG tablet TAKE 1 TABLET (5 MG TOTAL) BY MOUTH DAILY. 90 tablet 3  . Cyanocobalamin (VITAMIN B-12) 500 MCG SUBL 1 sl qd 100 tablet 5  . cyclobenzaprine (FLEXERIL) 5 MG tablet Take 1 tablet (5 mg total) by mouth 3 (three) times daily as needed for muscle spasms. 60 tablet 1  . Diclofenac Sodium 1.5 % SOLN PLACE 0.3MLS ONTO THE SKIN 4 TIMES DAILY AS NEEDED 150 mL 2  . dicyclomine (BENTYL) 10 MG capsule TAKE 1 CAPSULE (10 MG TOTAL) BY MOUTH EVERY 8 (EIGHT) HOURS AS NEEDED FOR SPASMS. 60 capsule 0  . fluticasone (FLONASE) 50 MCG/ACT nasal spray Place 1 spray into both nostrils as needed. 16 g 11  . omeprazole (PRILOSEC) 40 MG capsule Take 1 capsule (40 mg total) by mouth daily. 90 capsule 3  . Probiotic Product (VSL#3 DS) PACK Take 112.5 each by mouth 2 (two) times daily. 60 each 3  . SUMAtriptan (IMITREX) 100 MG tablet Take 1 tablet (100 mg total) by mouth every 2 (two) hours as needed for migraine. May repeat in 2 hours if headache persists or recurs. 12 tablet 5  . telmisartan-hydrochlorothiazide (MICARDIS HCT) 80-25 MG tablet Take 1 tablet by mouth daily. 90 tablet 3  . traMADol (ULTRAM) 50 MG tablet Take 1 tablet (50 mg total) by mouth every 6 (six) hours as needed for severe pain. 120 tablet 0  . valACYclovir (VALTREX) 500 MG tablet Take 1 tablet (500 mg total) by mouth 2 (two) times daily. For 5 days with outbreak 30 tablet 1   Facility-Administered Medications Prior to Visit  Medication Dose Route Frequency Provider Last Rate Last Dose  . 0.9 %  sodium chloride infusion  500 mL Intravenous  Continuous Armbruster, Carlota Raspberry, MD        ROS: Review of Systems  Constitutional: Negative for activity change, appetite change, chills, fatigue and unexpected weight change.  HENT: Negative for congestion, mouth sores and sinus pressure.   Eyes: Negative for visual disturbance.  Respiratory: Negative for cough and chest tightness.   Gastrointestinal: Negative for abdominal pain and nausea.  Genitourinary: Negative for difficulty urinating, frequency and vaginal pain.  Musculoskeletal: Positive for arthralgias and back pain. Negative for gait problem.  Skin: Negative for pallor and rash.  Neurological: Negative for dizziness, tremors, weakness, numbness and headaches.  Psychiatric/Behavioral: Negative for confusion and sleep disturbance.    Objective:  BP (!) 160/90 (BP Location: Left Arm, Patient Position: Sitting, Cuff Size: Normal)   Pulse (!) 101   Ht 5\' 4"  (1.626 m)   Wt 170 lb (77.1 kg)   SpO2 98%   BMI 29.18 kg/m   BP Readings from Last 3 Encounters:  02/03/18 (!) 160/90  11/30/17 (!) 142/82  10/21/17 138/84    Wt Readings from Last 3 Encounters:  02/03/18 170 lb (77.1 kg)  11/30/17 170 lb (77.1 kg)  10/21/17 170 lb (77.1 kg)    Physical Exam  Constitutional: She appears well-developed. No distress.  HENT:  Head: Normocephalic.  Right Ear: External ear normal.  Left Ear: External ear normal.  Nose: Nose normal.  Mouth/Throat: Oropharynx is clear and moist.  Eyes: Pupils are equal, round, and reactive to light. Conjunctivae are normal. Right eye exhibits no discharge. Left eye exhibits no discharge.  Neck: Normal range of motion. Neck supple. No JVD present. No tracheal deviation present. No thyromegaly present.  Cardiovascular: Normal rate, regular rhythm and normal heart sounds.  Pulmonary/Chest: No stridor. No respiratory distress. She has no wheezes.  Abdominal: Soft. Bowel sounds are normal. She exhibits no distension and no mass. There is no tenderness.  There is no rebound and no guarding.  Musculoskeletal: She exhibits tenderness. She exhibits no edema.  Lymphadenopathy:    She has no cervical adenopathy.  Neurological: She displays normal reflexes. No cranial nerve deficit. She exhibits normal muscle tone. Coordination normal.  Skin: No rash noted. No erythema.  Psychiatric: She has a normal mood and affect. Her behavior is normal. Judgment and thought content normal.  R shoulder tender UE pulses, MS - ok  Lab Results  Component Value Date   WBC 5.1 09/12/2017   HGB 13.6 09/12/2017   HCT 40.5 09/12/2017   PLT 396.0 09/12/2017   GLUCOSE 106 (H) 09/12/2017   CHOL 169 01/19/2017   TRIG 95.0 01/19/2017   HDL 44.70 01/19/2017   LDLCALC 105 (H) 01/19/2017   ALT 26 09/12/2017   AST 18 09/12/2017   NA 136 09/12/2017   K 3.8 09/12/2017   CL 98 09/12/2017   CREATININE 0.88 09/12/2017   BUN 12 09/12/2017   CO2 31 09/12/2017   TSH 0.47 09/12/2017    Dg Chest 2 View  Result Date: 10/21/2017 CLINICAL DATA:  Interval chest pain EXAM: CHEST - 2 VIEW COMPARISON:  09/21/2017 FINDINGS: The heart size and mediastinal contours are within normal limits. Both lungs are clear. The visualized skeletal structures are unremarkable. IMPRESSION: No active cardiopulmonary disease. Electronically Signed   By: Kathreen Devoid   On: 10/21/2017 15:21    Assessment & Plan:   There are no diagnoses linked to this encounter.   No orders of the defined types were placed in this encounter.    Follow-up: No follow-ups on file.  Walker Kehr, MD

## 2018-02-03 NOTE — Assessment & Plan Note (Signed)
Resolved

## 2018-03-21 ENCOUNTER — Other Ambulatory Visit: Payer: Self-pay | Admitting: Internal Medicine

## 2018-03-29 ENCOUNTER — Ambulatory Visit: Payer: Self-pay | Admitting: Internal Medicine

## 2018-04-18 ENCOUNTER — Ambulatory Visit (INDEPENDENT_AMBULATORY_CARE_PROVIDER_SITE_OTHER): Payer: Federal, State, Local not specified - PPO | Admitting: Internal Medicine

## 2018-04-18 ENCOUNTER — Encounter: Payer: Self-pay | Admitting: Internal Medicine

## 2018-04-18 ENCOUNTER — Other Ambulatory Visit (INDEPENDENT_AMBULATORY_CARE_PROVIDER_SITE_OTHER): Payer: Federal, State, Local not specified - PPO

## 2018-04-18 VITALS — BP 140/90 | HR 97 | Ht 64.0 in | Wt 173.0 lb

## 2018-04-18 DIAGNOSIS — I119 Hypertensive heart disease without heart failure: Secondary | ICD-10-CM

## 2018-04-18 DIAGNOSIS — E538 Deficiency of other specified B group vitamins: Secondary | ICD-10-CM | POA: Diagnosis not present

## 2018-04-18 DIAGNOSIS — R0789 Other chest pain: Secondary | ICD-10-CM

## 2018-04-18 DIAGNOSIS — I10 Essential (primary) hypertension: Secondary | ICD-10-CM

## 2018-04-18 DIAGNOSIS — E559 Vitamin D deficiency, unspecified: Secondary | ICD-10-CM

## 2018-04-18 DIAGNOSIS — Z0000 Encounter for general adult medical examination without abnormal findings: Secondary | ICD-10-CM

## 2018-04-18 DIAGNOSIS — S43439S Superior glenoid labrum lesion of unspecified shoulder, sequela: Secondary | ICD-10-CM

## 2018-04-18 LAB — LIPID PANEL
Cholesterol: 183 mg/dL (ref 0–200)
HDL: 36.2 mg/dL — AB (ref >39.00)
LDL CALC: 132 mg/dL — AB (ref 0–99)
NONHDL: 146.38
Total CHOL/HDL Ratio: 5
Triglycerides: 73 mg/dL (ref 0.0–149.0)
VLDL: 14.6 mg/dL (ref 0.0–40.0)

## 2018-04-18 LAB — CBC WITH DIFFERENTIAL/PLATELET
BASOS ABS: 0.1 10*3/uL (ref 0.0–0.1)
Basophils Relative: 1.2 % (ref 0.0–3.0)
EOS PCT: 1 % (ref 0.0–5.0)
Eosinophils Absolute: 0 10*3/uL (ref 0.0–0.7)
HCT: 39 % (ref 36.0–46.0)
HEMOGLOBIN: 13.3 g/dL (ref 12.0–15.0)
LYMPHS PCT: 38.2 % (ref 12.0–46.0)
Lymphs Abs: 1.9 10*3/uL (ref 0.7–4.0)
MCHC: 34.1 g/dL (ref 30.0–36.0)
MCV: 93.4 fl (ref 78.0–100.0)
MONOS PCT: 6.5 % (ref 3.0–12.0)
Monocytes Absolute: 0.3 10*3/uL (ref 0.1–1.0)
Neutro Abs: 2.6 10*3/uL (ref 1.4–7.7)
Neutrophils Relative %: 53.1 % (ref 43.0–77.0)
Platelets: 336 10*3/uL (ref 150.0–400.0)
RBC: 4.17 Mil/uL (ref 3.87–5.11)
RDW: 12.1 % (ref 11.5–15.5)
WBC: 4.9 10*3/uL (ref 4.0–10.5)

## 2018-04-18 LAB — HEPATIC FUNCTION PANEL
ALBUMIN: 4.3 g/dL (ref 3.5–5.2)
ALK PHOS: 80 U/L (ref 39–117)
ALT: 24 U/L (ref 0–35)
AST: 18 U/L (ref 0–37)
BILIRUBIN DIRECT: 0.1 mg/dL (ref 0.0–0.3)
TOTAL PROTEIN: 8 g/dL (ref 6.0–8.3)
Total Bilirubin: 0.6 mg/dL (ref 0.2–1.2)

## 2018-04-18 LAB — URINALYSIS
Bilirubin Urine: NEGATIVE
HGB URINE DIPSTICK: NEGATIVE
Ketones, ur: NEGATIVE
LEUKOCYTES UA: NEGATIVE
NITRITE: NEGATIVE
Specific Gravity, Urine: 1.025 (ref 1.000–1.030)
Total Protein, Urine: NEGATIVE
UROBILINOGEN UA: 0.2 (ref 0.0–1.0)
Urine Glucose: NEGATIVE
pH: 5.5 (ref 5.0–8.0)

## 2018-04-18 LAB — BASIC METABOLIC PANEL
BUN: 11 mg/dL (ref 6–23)
CALCIUM: 9.4 mg/dL (ref 8.4–10.5)
CO2: 29 mEq/L (ref 19–32)
CREATININE: 0.92 mg/dL (ref 0.40–1.20)
Chloride: 98 mEq/L (ref 96–112)
GFR: 79.77 mL/min (ref >60.00)
GLUCOSE: 95 mg/dL (ref 70–99)
Potassium: 3.3 mEq/L — ABNORMAL LOW (ref 3.5–5.1)
SODIUM: 137 meq/L (ref 135–145)

## 2018-04-18 LAB — TSH: TSH: 0.71 u[IU]/mL (ref 0.35–4.50)

## 2018-04-18 NOTE — Assessment & Plan Note (Signed)
Vit d 

## 2018-04-18 NOTE — Assessment & Plan Note (Signed)
BP Readings from Last 3 Encounters:  04/18/18 140/90  02/03/18 (!) 160/90  11/30/17 (!) 142/82

## 2018-04-18 NOTE — Assessment & Plan Note (Addendum)
Tramadol -d/c due to memory lapses She can try Tramadol 1/2 or 1/4 two-for times a day to see if less issues with memory

## 2018-04-18 NOTE — Assessment & Plan Note (Signed)
EKG

## 2018-04-18 NOTE — Progress Notes (Signed)
Subjective:  Patient ID: Karina Newman, female    DOB: 1956-07-19  Age: 62 y.o. MRN: [id number]  CC: No chief complaint on file.   HPI Karina Newman presents for a well exam C/o CP on the L - spastic behind the L breast x 5 min or so. Off and on x weeks... Pain is not exertional. No n/v. No sweats, weakness. C/o memory lapses on Tramadol - she stopped it.   Outpatient Medications Prior to Visit  Medication Sig Dispense Refill  . amLODipine (NORVASC) 5 MG tablet TAKE 1 TABLET (5 MG TOTAL) BY MOUTH DAILY. 90 tablet 3  . Cyanocobalamin (VITAMIN B-12) 500 MCG SUBL 1 sl qd 100 tablet 5  . cyclobenzaprine (FLEXERIL) 5 MG tablet TAKE 1 TABLET BY MOUTH THREE TIMES A DAY AS NEEDED FOR MUSCLE SPASMS 60 tablet 1  . Diclofenac Sodium 1.5 % SOLN PLACE 0.3MLS ONTO THE SKIN 4 TIMES DAILY AS NEEDED 150 mL 2  . dicyclomine (BENTYL) 10 MG capsule TAKE 1 CAPSULE (10 MG TOTAL) BY MOUTH EVERY 8 (EIGHT) HOURS AS NEEDED FOR SPASMS. 60 capsule 0  . fluticasone (FLONASE) 50 MCG/ACT nasal spray Place 1 spray into both nostrils as needed. 16 g 11  . omeprazole (PRILOSEC) 40 MG capsule Take 1 capsule (40 mg total) by mouth daily. 90 capsule 3  . Probiotic Product (RESTORA) CAPS 1 po qd 30 capsule 11  . sucralfate (CARAFATE) 1 g tablet TAKE 1 TABLET BY MOUTH 3 TIMES A DAY BETWEEN MEALS 90 tablet 11  . SUMAtriptan (IMITREX) 100 MG tablet Take 1 tablet (100 mg total) by mouth every 2 (two) hours as needed for migraine. May repeat in 2 hours if headache persists or recurs. 12 tablet 5  . telmisartan-hydrochlorothiazide (MICARDIS HCT) 80-25 MG tablet Take 1 tablet by mouth daily. 90 tablet 3  . traMADol (ULTRAM) 50 MG tablet Take 1 tablet (50 mg total) by mouth every 6 (six) hours as needed for severe pain. 120 tablet 2  . valACYclovir (VALTREX) 500 MG tablet Take 1 tablet (500 mg total) by mouth 2 (two) times daily. For 5 days with outbreak 30 tablet 1   Facility-Administered Medications Prior to Visit  Medication  Dose Route Frequency Provider Last Rate Last Dose  . 0.9 %  sodium chloride infusion  500 mL Intravenous Continuous Armbruster, Carlota Raspberry, MD        ROS: Review of Systems  Constitutional: Negative for activity change, appetite change, chills, fatigue and unexpected weight change.  HENT: Negative for congestion, mouth sores and sinus pressure.   Eyes: Negative for visual disturbance.  Respiratory: Positive for chest tightness. Negative for cough.   Cardiovascular: Positive for chest pain. Negative for palpitations.  Gastrointestinal: Negative for abdominal pain and nausea.  Genitourinary: Negative for difficulty urinating, frequency and vaginal pain.  Musculoskeletal: Positive for arthralgias and back pain. Negative for gait problem.  Skin: Negative for pallor and rash.  Neurological: Negative for dizziness, tremors, weakness, numbness and headaches.  Psychiatric/Behavioral: Positive for decreased concentration. Negative for confusion, sleep disturbance and suicidal ideas.    Objective:  BP 140/90 (BP Location: Left Arm, Patient Position: Sitting, Cuff Size: Normal)   Pulse 97   Ht 5\' 4"  (1.626 m)   Wt 173 lb (78.5 kg)   SpO2 99%   BMI 29.70 kg/m   BP Readings from Last 3 Encounters:  04/18/18 140/90  02/03/18 (!) 160/90  11/30/17 (!) 142/82    Wt Readings from Last 3 Encounters:  04/18/18 173  lb (78.5 kg)  02/03/18 170 lb (77.1 kg)  11/30/17 170 lb (77.1 kg)    Physical Exam  Constitutional: She appears well-developed. No distress.  HENT:  Head: Normocephalic.  Right Ear: External ear normal.  Left Ear: External ear normal.  Nose: Nose normal.  Mouth/Throat: Oropharynx is clear and moist.  Eyes: Pupils are equal, round, and reactive to light. Conjunctivae are normal. Right eye exhibits no discharge. Left eye exhibits no discharge.  Neck: Normal range of motion. Neck supple. No JVD present. No tracheal deviation present. No thyromegaly present.  Cardiovascular: Normal  rate, regular rhythm and normal heart sounds.  Pulmonary/Chest: No stridor. No respiratory distress. She has no wheezes.  Abdominal: Soft. Bowel sounds are normal. She exhibits no distension and no mass. There is no tenderness. There is no rebound and no guarding.  Musculoskeletal: She exhibits tenderness. She exhibits no edema.  Lymphadenopathy:    She has no cervical adenopathy.  Neurological: She displays normal reflexes. No cranial nerve deficit. She exhibits normal muscle tone. Coordination normal.  Skin: No rash noted. No erythema.  Psychiatric: She has a normal mood and affect. Her behavior is normal. Judgment and thought content normal.  LS tender  Procedure: EKG Indication: chest pain Impression: NSR. No acute changes.   Lab Results  Component Value Date   WBC 5.1 09/12/2017   HGB 13.6 09/12/2017   HCT 40.5 09/12/2017   PLT 396.0 09/12/2017   GLUCOSE 106 (H) 09/12/2017   CHOL 169 01/19/2017   TRIG 95.0 01/19/2017   HDL 44.70 01/19/2017   LDLCALC 105 (H) 01/19/2017   ALT 26 09/12/2017   AST 18 09/12/2017   NA 136 09/12/2017   K 3.8 09/12/2017   CL 98 09/12/2017   CREATININE 0.88 09/12/2017   BUN 12 09/12/2017   CO2 31 09/12/2017   TSH 0.47 09/12/2017    Dg Chest 2 View  Result Date: 10/21/2017 CLINICAL DATA:  Interval chest pain EXAM: CHEST - 2 VIEW COMPARISON:  09/21/2017 FINDINGS: The heart size and mediastinal contours are within normal limits. Both lungs are clear. The visualized skeletal structures are unremarkable. IMPRESSION: No active cardiopulmonary disease. Electronically Signed   By: Kathreen Devoid   On: 10/21/2017 15:21    Assessment & Plan:   Diagnoses and all orders for this visit:  HTN (hypertension), benign -     EKG 12-Lead     No orders of the defined types were placed in this encounter.    Follow-up: No follow-ups on file.  Walker Kehr, MD

## 2018-04-18 NOTE — Assessment & Plan Note (Signed)
On B12 

## 2018-04-18 NOTE — Assessment & Plan Note (Addendum)
EKG Cardiol ref Dr Oval Linsey

## 2018-04-18 NOTE — Patient Instructions (Signed)
You can try Tramadol 1/2 or 1/4 two-for times a day to see if less issues with memory

## 2018-04-18 NOTE — Assessment & Plan Note (Signed)
We discussed age appropriate health related issues, including available/recomended screening tests and vaccinations. We discussed a need for adhering to healthy diet and exercise. Labs were ordered to be later reviewed . All questions were answered.   

## 2018-04-24 ENCOUNTER — Other Ambulatory Visit: Payer: Self-pay | Admitting: Internal Medicine

## 2018-04-24 MED ORDER — POTASSIUM CHLORIDE ER 10 MEQ PO TBCR: 10.0000 meq | EXTENDED_RELEASE_TABLET | Freq: Every day | ORAL | 5 refills | Status: DC

## 2018-04-25 ENCOUNTER — Other Ambulatory Visit: Payer: Self-pay | Admitting: Internal Medicine

## 2018-05-02 ENCOUNTER — Encounter: Payer: Self-pay | Admitting: Internal Medicine

## 2018-05-02 ENCOUNTER — Ambulatory Visit (INDEPENDENT_AMBULATORY_CARE_PROVIDER_SITE_OTHER): Payer: Federal, State, Local not specified - PPO | Admitting: Internal Medicine

## 2018-05-02 DIAGNOSIS — I10 Essential (primary) hypertension: Secondary | ICD-10-CM

## 2018-05-02 DIAGNOSIS — E538 Deficiency of other specified B group vitamins: Secondary | ICD-10-CM

## 2018-05-02 DIAGNOSIS — M25511 Pain in right shoulder: Secondary | ICD-10-CM | POA: Diagnosis not present

## 2018-05-02 DIAGNOSIS — E559 Vitamin D deficiency, unspecified: Secondary | ICD-10-CM | POA: Diagnosis not present

## 2018-05-02 DIAGNOSIS — G8929 Other chronic pain: Secondary | ICD-10-CM

## 2018-05-02 DIAGNOSIS — M25512 Pain in left shoulder: Secondary | ICD-10-CM

## 2018-05-02 MED ORDER — AMLODIPINE BESYLATE 5 MG PO TABS: 5.0000 mg | ORAL_TABLET | Freq: Every day | ORAL | 3 refills | Status: DC

## 2018-05-02 MED ORDER — KETOPROFEN 50 MG PO CAPS: 50.0000 mg | ORAL_CAPSULE | Freq: Three times a day (TID) | ORAL | 3 refills | Status: DC | PRN

## 2018-05-02 MED ORDER — VITAMIN B-12 500 MCG SL SUBL: SUBLINGUAL_TABLET | SUBLINGUAL | 5 refills | Status: AC

## 2018-05-02 MED ORDER — CYCLOBENZAPRINE HCL 5 MG PO TABS: ORAL_TABLET | ORAL | 3 refills | Status: DC

## 2018-05-02 MED ORDER — GABAPENTIN 100 MG PO CAPS: 100.0000 mg | ORAL_CAPSULE | Freq: Three times a day (TID) | ORAL | 3 refills | Status: DC | PRN

## 2018-05-02 NOTE — Assessment & Plan Note (Addendum)
D/c'd Tramadol due to memory issues The pt was on Ketoprofen - it helped in the past, however - she had gastritis Will give a try to a low dose Gabapentin

## 2018-05-02 NOTE — Assessment & Plan Note (Signed)
D/c'd Tramadol due to memory issues The pt was on Ketoprofen - it helped in the past

## 2018-05-02 NOTE — Progress Notes (Signed)
Subjective:  Patient ID: Karina Newman, female    DOB: 05/07/1956  Age: 62 y.o. MRN: [id number]  CC: No chief complaint on file.   HPI Karina Newman presents for R shoulder pain. Pain is 5-11/10 in intensity. C/o Tramadol causing memory issues even with 1/2 tab.  Outpatient Medications Prior to Visit  Medication Sig Dispense Refill  . amLODipine (NORVASC) 5 MG tablet TAKE 1 TABLET (5 MG TOTAL) BY MOUTH DAILY. 90 tablet 3  . Cyanocobalamin (VITAMIN B-12) 500 MCG SUBL 1 sl qd 100 tablet 5  . cyclobenzaprine (FLEXERIL) 5 MG tablet TAKE 1 TABLET BY MOUTH THREE TIMES A DAY AS NEEDED FOR MUSCLE SPASMS 60 tablet 1  . Diclofenac Sodium 1.5 % SOLN PLACE 0.3MLS ONTO THE SKIN 4 TIMES DAILY AS NEEDED 150 mL 2  . dicyclomine (BENTYL) 10 MG capsule TAKE 1 CAPSULE (10 MG TOTAL) BY MOUTH EVERY 8 (EIGHT) HOURS AS NEEDED FOR SPASMS. 60 capsule 2  . fluticasone (FLONASE) 50 MCG/ACT nasal spray Place 1 spray into both nostrils as needed. 16 g 11  . omeprazole (PRILOSEC) 40 MG capsule Take 1 capsule (40 mg total) by mouth daily. 90 capsule 3  . potassium chloride (KLOR-CON 10) 10 MEQ tablet Take 1 tablet (10 mEq total) by mouth daily. 30 tablet 5  . Probiotic Product (RESTORA) CAPS 1 po qd 30 capsule 11  . sucralfate (CARAFATE) 1 g tablet TAKE 1 TABLET BY MOUTH 3 TIMES A DAY BETWEEN MEALS 90 tablet 11  . SUMAtriptan (IMITREX) 100 MG tablet Take 1 tablet (100 mg total) by mouth every 2 (two) hours as needed for migraine. May repeat in 2 hours if headache persists or recurs. 12 tablet 5  . telmisartan-hydrochlorothiazide (MICARDIS HCT) 80-25 MG tablet Take 1 tablet by mouth daily. 90 tablet 3  . valACYclovir (VALTREX) 500 MG tablet Take 1 tablet (500 mg total) by mouth 2 (two) times daily. For 5 days with outbreak 30 tablet 1  . Vitamin D, Cholecalciferol, 1000 units CAPS Take 2,000 Units by mouth.     Facility-Administered Medications Prior to Visit  Medication Dose Route Frequency Provider Last Rate Last  Dose  . 0.9 %  sodium chloride infusion  500 mL Intravenous Continuous Armbruster, Carlota Raspberry, MD        ROS: Review of Systems  Constitutional: Negative for activity change, appetite change, chills, fatigue and unexpected weight change.  HENT: Negative for congestion, mouth sores and sinus pressure.   Eyes: Negative for visual disturbance.  Respiratory: Negative for cough and chest tightness.   Gastrointestinal: Negative for abdominal pain and nausea.  Genitourinary: Negative for difficulty urinating, frequency and vaginal pain.  Musculoskeletal: Positive for arthralgias and neck stiffness. Negative for back pain and gait problem.  Skin: Negative for pallor and rash.  Neurological: Negative for dizziness, tremors, weakness, numbness and headaches.  Psychiatric/Behavioral: Positive for decreased concentration. Negative for confusion, sleep disturbance and suicidal ideas.    Objective:  BP 132/88 (BP Location: Left Arm, Patient Position: Sitting, Cuff Size: Large)   Pulse 92   Temp 98.7 F (37.1 C) (Oral)   Ht 5\' 4"  (1.626 m)   Wt 174 lb (78.9 kg)   SpO2 99%   BMI 29.87 kg/m   BP Readings from Last 3 Encounters:  05/02/18 132/88  04/18/18 140/90  02/03/18 (!) 160/90    Wt Readings from Last 3 Encounters:  05/02/18 174 lb (78.9 kg)  04/18/18 173 lb (78.5 kg)  02/03/18 170 lb (77.1 kg)  Physical Exam  Constitutional: She appears well-developed. No distress.  HENT:  Head: Normocephalic.  Right Ear: External ear normal.  Left Ear: External ear normal.  Nose: Nose normal.  Mouth/Throat: Oropharynx is clear and moist.  Eyes: Pupils are equal, round, and reactive to light. Conjunctivae are normal. Right eye exhibits no discharge. Left eye exhibits no discharge.  Neck: Normal range of motion. Neck supple. No JVD present. No tracheal deviation present. No thyromegaly present.  Cardiovascular: Normal rate, regular rhythm and normal heart sounds.  Pulmonary/Chest: No stridor.  No respiratory distress. She has no wheezes.  Abdominal: Soft. Bowel sounds are normal. She exhibits no distension and no mass. There is no tenderness. There is no rebound and no guarding.  Musculoskeletal: She exhibits tenderness. She exhibits no edema.  Lymphadenopathy:    She has no cervical adenopathy.  Neurological: She displays normal reflexes. No cranial nerve deficit. She exhibits normal muscle tone. Coordination normal.  Skin: No rash noted. No erythema.  Psychiatric: She has a normal mood and affect. Her behavior is normal. Judgment and thought content normal.    Lab Results  Component Value Date   WBC 4.9 04/18/2018   HGB 13.3 04/18/2018   HCT 39.0 04/18/2018   PLT 336.0 04/18/2018   GLUCOSE 95 04/18/2018   CHOL 183 04/18/2018   TRIG 73.0 04/18/2018   HDL 36.20 (L) 04/18/2018   LDLCALC 132 (H) 04/18/2018   ALT 24 04/18/2018   AST 18 04/18/2018   NA 137 04/18/2018   K 3.3 (L) 04/18/2018   CL 98 04/18/2018   CREATININE 0.92 04/18/2018   BUN 11 04/18/2018   CO2 29 04/18/2018   TSH 0.71 04/18/2018    Dg Chest 2 View  Result Date: 10/21/2017 CLINICAL DATA:  Interval chest pain EXAM: CHEST - 2 VIEW COMPARISON:  09/21/2017 FINDINGS: The heart size and mediastinal contours are within normal limits. Both lungs are clear. The visualized skeletal structures are unremarkable. IMPRESSION: No active cardiopulmonary disease. Electronically Signed   By: Kathreen Devoid   On: 10/21/2017 15:21    Assessment & Plan:   There are no diagnoses linked to this encounter.   No orders of the defined types were placed in this encounter.    Follow-up: No follow-ups on file.  Walker Kehr, MD

## 2018-05-02 NOTE — Assessment & Plan Note (Signed)
Amlodipine and Micardis HCT

## 2018-05-02 NOTE — Assessment & Plan Note (Signed)
On Vit D 

## 2018-05-02 NOTE — Assessment & Plan Note (Signed)
On Vit B12 

## 2018-05-05 ENCOUNTER — Ambulatory Visit: Payer: Federal, State, Local not specified - PPO | Admitting: Physician Assistant

## 2018-05-05 ENCOUNTER — Encounter: Payer: Self-pay | Admitting: Physician Assistant

## 2018-05-05 VITALS — BP 136/78 | HR 101 | Ht 64.0 in | Wt 176.2 lb

## 2018-05-05 DIAGNOSIS — R1013 Epigastric pain: Secondary | ICD-10-CM

## 2018-05-05 DIAGNOSIS — K219 Gastro-esophageal reflux disease without esophagitis: Secondary | ICD-10-CM

## 2018-05-05 DIAGNOSIS — K2940 Chronic atrophic gastritis without bleeding: Secondary | ICD-10-CM | POA: Diagnosis not present

## 2018-05-05 MED ORDER — AMBULATORY NON FORMULARY MEDICATION: 1 refills | Status: DC

## 2018-05-05 NOTE — Patient Instructions (Signed)
You have been scheduled for an endoscopy. Please follow written instructions given to you at your visit today. If you use inhalers (even only as needed), please bring them with you on the day of your procedure. Your physician has requested that you go to www.startemmi.com and enter the access code given to you at your visit today. This web site gives a general overview about your procedure. However, you should still follow specific instructions given to you by our office regarding your preparation for the procedure.  We have sent the following medications to your pharmacy for you to pick up at your convenience: GI cocktail 5-10 mL every 4-6 hours as needed   Please purchase the following medications over the counter and take as directed: VSL#3 twice a day

## 2018-05-05 NOTE — Progress Notes (Signed)
Agree with assessment and plan as outlined.  

## 2018-05-05 NOTE — Addendum Note (Signed)
Addended by: Wyline Beady on: 05/05/2018 02:24 PM   Modules accepted: Orders

## 2018-05-05 NOTE — Progress Notes (Signed)
Chief Complaint: Autoimmune gastritis, epigastric pain, GERD  HPI:    Karina Newman is a 62 year old African-American female with a past medical history as listed below, known to Dr. Havery Moros for her history of autoimmune gastritis and reflux as well as upper abdominal discomfort, who presents to clinic today with a complaint of epigastric pain and reflux.    01/21/2017 office visit with Dr. Havery Moros.  At that time she was started on VSL #3 twice a day for her bloating and lower abdominal pain.  It was discussed that her last EGD was in 2017 and it was recommended she have a follow-up in 2 to 3 years.  Her reflux is well controlled with Omeprazole.    Today, patient tells me that for about 6 months after seeing Dr. Havery Moros she was doing well and felt as though the VSL #3 twice daily really helped a lot of her symptoms, but about 6 months ago she stopped being able to find this at the pharmacy and has been off of it.  Over the past 3 months her symptoms have gotten worse and worse.  Today she complains of epigastric discomfort which is so bad that anytime she gets uncomfortable and feels full.  Describes the pain as a 7-8/10.  Also notes that when she eats she typically regurgitates her food with acid, sometimes directly after eating and sometimes a little while later.  Also describes a feeling of food getting stuck on the way down.  She is currently using her Omeprazole 40 mg daily, Bentyl 10 mg 3 times daily and Carafate 3 times daily but does not feel as though her symptoms are controlled.    Denies fever, chills, blood in her stool, weight loss, anorexia or symptoms that awaken her from sleep.  Previous work up: Workup as follows: CT scan abdomen for RLQ pain - 09/09/16, oral contrast only - hepatic steatosis, small inguinal hernias, small umbilical hernia EGD 2/42/68 - normal EGD, biopsies taken for H pylori - showing "marked chronic gastritis" , negative for HP Colonoscopy 05/06/16 - normal  colon and ileum, small adenoma, hemorrhoids. Recall colonoscopy in 5 years. [place] 03/04/16 - no gallstones, fatty liver  Labs show negative IF AB, positive parietal cell AB, and B12 deficiency. Diagnosed with autoimmune gastritis.  Past Medical History:  Diagnosis Date  . Abnormal cervical Pap smear with positive HPV DNA test 01/2014,01/2017   2015 Normal cytology with positive high-risk HPV 18/45. Colposcopy negative with negative ECC.  2018 normal cytology with positive high-risk HPV 18/45  . Anemia   . Autoimmune gastritis   . Fatty liver   . Gastritis   . Heart murmur   . Hypertension   . Hyperthyroidism   . Internal hemorrhoids   . Right shoulder pain 2001   chronic pain  . Thyroid disease    Hyperthyroid. Was on PTU (Dr Hampton Abbot in [place]) - stopped in 2013, stable  . Tubular adenoma of colon     Past Surgical History:  Procedure Laterality Date  . BREAST CYST ASPIRATION Left 2012  . CESAREAN SECTION     x 4   . HAND SURGERY Left   . SHOULDER SURGERY Right 2002  . SHOULDER SURGERY Left 2014  . TUBAL LIGATION      Current Outpatient Medications  Medication Sig Dispense Refill  . amLODipine (NORVASC) 5 MG tablet Take 1 tablet (5 mg total) by mouth daily. 90 tablet 3  . Cyanocobalamin (VITAMIN B-12) 500 MCG SUBL 1 sl qd 100  tablet 5  . cyclobenzaprine (FLEXERIL) 5 MG tablet TAKE 1 TABLET BY MOUTH THREE TIMES A DAY AS NEEDED FOR MUSCLE SPASMS 60 tablet 3  . Diclofenac Sodium 1.5 % SOLN PLACE 0.3MLS ONTO THE SKIN 4 TIMES DAILY AS NEEDED 150 mL 2  . dicyclomine (BENTYL) 10 MG capsule TAKE 1 CAPSULE (10 MG TOTAL) BY MOUTH EVERY 8 (EIGHT) HOURS AS NEEDED FOR SPASMS. 60 capsule 2  . fluticasone (FLONASE) 50 MCG/ACT nasal spray Place 1 spray into both nostrils as needed. 16 g 11  . gabapentin (NEURONTIN) 100 MG capsule Take 1 capsule (100 mg total) by mouth 3 (three) times daily as needed. 90 capsule 3  . omeprazole (PRILOSEC) 40 MG capsule Take 1 capsule (40 mg total) by mouth daily. 90  capsule 3  . potassium chloride (KLOR-CON 10) 10 MEQ tablet Take 1 tablet (10 mEq total) by mouth daily. 30 tablet 5  . sucralfate (CARAFATE) 1 g tablet TAKE 1 TABLET BY MOUTH 3 TIMES A DAY BETWEEN MEALS 90 tablet 11  . SUMAtriptan (IMITREX) 100 MG tablet Take 1 tablet (100 mg total) by mouth every 2 (two) hours as needed for migraine. May repeat in 2 hours if headache persists or recurs. 12 tablet 5  . telmisartan-hydrochlorothiazide (MICARDIS HCT) 80-25 MG tablet Take 1 tablet by mouth daily. 90 tablet 3  . valACYclovir (VALTREX) 500 MG tablet Take 1 tablet (500 mg total) by mouth 2 (two) times daily. For 5 days with outbreak 30 tablet 1  . Vitamin D, Cholecalciferol, 1000 units CAPS Take 2,000 Units by mouth.     Current Facility-Administered Medications  Medication Dose Route Frequency Provider Last Rate Last Dose  . 0.9 %  sodium chloride infusion  500 mL Intravenous Continuous Armbruster, Carlota Raspberry, MD        Allergies as of 05/05/2018 - Review Complete 05/05/2018  Allergen Reaction Noted  . Penicillins Hives 07/03/2013  . Gabapentin  11/25/2014  . Iodine Other (See Comments) 09/03/2016  . Lyrica [pregabalin] Dermatitis 01/20/2015  . Nsaids  05/18/2016  . Tramadol  04/18/2018  . Voltaren [diclofenac] Hives 07/16/2015    Family History  Problem Relation Age of Onset  . Kidney disease Mother        ESRD  . Hypertension Mother   . Cancer Father        lung ca  . Hypertension Sister   . Diabetes Brother   . Diabetes Paternal Grandmother   . Colon cancer Neg Hx     Social History   Socioeconomic History  . Marital status: Married    Spouse name: Not on file  . Number of children: 4  . Years of education: Not on file  . Highest education level: Not on file  Occupational History  . Occupation: RETIRED  Social Needs  . Financial resource strain: Not on file  . Food insecurity:    Worry: Not on file    Inability: Not on file  . Transportation needs:    Medical: Not on  file    Non-medical: Not on file  Tobacco Use  . Smoking status: Never Smoker  . Smokeless tobacco: Never Used  Substance and Sexual Activity  . Alcohol use: No  . Drug use: No  . Sexual activity: Yes    Birth control/protection: Post-menopausal, Surgical    Comment: BTL-1st intercourse 77 yo-5 partners  Lifestyle  . Physical activity:    Days per week: Not on file    Minutes per session: Not on  file  . Stress: Not on file  Relationships  . Social connections:    Talks on phone: Not on file    Gets together: Not on file    Attends religious service: Not on file    Active member of club or organization: Not on file    Attends meetings of clubs or organizations: Not on file    Relationship status: Not on file  . Intimate partner violence:    Fear of current or ex partner: Not on file    Emotionally abused: Not on file    Physically abused: Not on file    Forced sexual activity: Not on file  Other Topics Concern  . Not on file  Social History Narrative  . Not on file    Review of Systems:    Constitutional: No fever or chills Cardiovascular: No chest pain  Respiratory: No SOB Gastrointestinal: See HPI and otherwise negative   Physical Exam:  Vital signs: BP 136/78   Pulse (!) 101   Ht 5\' 4"  (1.626 m)   Wt 176 lb 3.2 oz (79.9 kg)   SpO2 98%   BMI 30.24 kg/m   Constitutional:   Pleasant AA female appears to be in NAD, Well developed, Well nourished, alert and cooperative Respiratory: Respirations even and unlabored. Lungs clear to auscultation bilaterally.   No wheezes, crackles, or rhonchi.  Cardiovascular: Normal S1, S2. No MRG. Regular rate and rhythm. No peripheral edema, cyanosis or pallor.  Gastrointestinal:  Soft, nondistended, moderate epigastric ttp with involuntary guarding, Normal bowel sounds. No appreciable masses or hepatomegaly. Rectal:  Not performed.  Psychiatric: Demonstrates good judgement and reason without abnormal affect or behaviors.  MOST  RECENT LABS AND IMAGING: CBC    Component Value Date/Time   WBC 4.9 04/18/2018 1527   RBC 4.17 04/18/2018 1527   HGB 13.3 04/18/2018 1527   HCT 39.0 04/18/2018 1527   PLT 336.0 04/18/2018 1527   MCV 93.4 04/18/2018 1527   MCH 31.4 03/31/2015 1623   MCHC 34.1 04/18/2018 1527   RDW 12.1 04/18/2018 1527   LYMPHSABS 1.9 04/18/2018 1527   MONOABS 0.3 04/18/2018 1527   EOSABS 0.0 04/18/2018 1527   BASOSABS 0.1 04/18/2018 1527    CMP     Component Value Date/Time   NA 137 04/18/2018 1527   K 3.3 (L) 04/18/2018 1527   CL 98 04/18/2018 1527   CO2 29 04/18/2018 1527   GLUCOSE 95 04/18/2018 1527   BUN 11 04/18/2018 1527   CREATININE 0.92 04/18/2018 1527   CALCIUM 9.4 04/18/2018 1527   PROT 8.0 04/18/2018 1527   ALBUMIN 4.3 04/18/2018 1527   AST 18 04/18/2018 1527   ALT 24 04/18/2018 1527   ALKPHOS 80 04/18/2018 1527   BILITOT 0.6 04/18/2018 1527   GFRNONAA >60 03/31/2015 1623   GFRAA >60 03/31/2015 1623    Assessment: 1.  Epigastric pain: Patient has had extensive work-up in the past with prior negative CT scan, but does have known history of chronic gastritis and reflux, suspect this is the cause today 2.  Autoimmune gastritis: Chronic gastritis noted on her last 2 endoscopies, history of B12 deficiency, positive antiparietal cell antibody, 2 prior endoscopies without any concerning lesions in the stomach, repeat EGD was recommended in 2-3 years after her last exam in 2017 3.  GERD: Uncontrolled on Omeprazole 40 mg daily and Carafate 3 times daily  Plan: 1.  Scheduled patient for an EGD with possible dilation in the [place] with Dr. Havery Moros.  Did discuss risk, benefits, limitations and alternatives and the patient agrees to proceed. 2.  Continue Omeprazole 40 mg daily, Dicyclomine 10 mg 3 times daily and Carafate 3 times daily 3.  Discussed with patient that she can order VSL #3 online.  This seemed to been helping her symptoms a lot.  Would recommend that she continue this twice  daily. 4.  Patient to follow in clinic per recommendations from Dr. Havery Moros after time of procedure.  Ellouise Newer, PA-C [facility] 05/05/2018, 2:06 PM  Cc: Cassandria Anger, MD

## 2018-05-06 ENCOUNTER — Other Ambulatory Visit: Payer: Self-pay | Admitting: Internal Medicine

## 2018-05-17 ENCOUNTER — Encounter: Payer: Self-pay | Admitting: Cardiovascular Disease

## 2018-05-17 ENCOUNTER — Ambulatory Visit: Payer: Federal, State, Local not specified - PPO | Admitting: Cardiovascular Disease

## 2018-05-17 VITALS — BP 144/88 | HR 111 | Ht 64.5 in | Wt 175.0 lb

## 2018-05-17 DIAGNOSIS — R0681 Apnea, not elsewhere classified: Secondary | ICD-10-CM | POA: Diagnosis not present

## 2018-05-17 DIAGNOSIS — R400 Somnolence: Secondary | ICD-10-CM

## 2018-05-17 DIAGNOSIS — R0789 Other chest pain: Secondary | ICD-10-CM

## 2018-05-17 DIAGNOSIS — I4711 Inappropriate sinus tachycardia, so stated: Secondary | ICD-10-CM

## 2018-05-17 DIAGNOSIS — I10 Essential (primary) hypertension: Secondary | ICD-10-CM

## 2018-05-17 DIAGNOSIS — R000 Tachycardia, unspecified: Secondary | ICD-10-CM

## 2018-05-17 DIAGNOSIS — R079 Chest pain, unspecified: Secondary | ICD-10-CM

## 2018-05-17 HISTORY — DX: Inappropriate sinus tachycardia, so stated: I47.11

## 2018-05-17 HISTORY — DX: Tachycardia, unspecified: R00.0

## 2018-05-17 HISTORY — DX: Apnea, not elsewhere classified: R06.81

## 2018-05-17 MED ORDER — CARVEDILOL 12.5 MG PO TABS: 12.5000 mg | ORAL_TABLET | Freq: Two times a day (BID) | ORAL | 3 refills | Status: DC

## 2018-05-17 NOTE — Patient Instructions (Addendum)
Medication Instructions:  INCREASE YOUR AMLODIPINE TO 10 MG DAILY UNTIL YOUR STRESS TEST AFTER STRESS TEST START CARVEDILOL 12.5 MG TWICE A DAY AND GO BACK TO AMLODIPINE 5 MG DAILY   Labwork: NONE  Testing/Procedures: Your physician has requested that you have en exercise stress myoview. For further information please visit [email or web address]. Please follow instruction sheet, as given.  Your physician has recommended that you have a sleep study. This test records several body functions during sleep, including: brain activity, eye movement, oxygen and carbon dioxide blood levels, heart rate and rhythm, breathing rate and rhythm, the flow of air through your mouth and nose, snoring, body muscle movements, and chest and belly movement. THE OFFICE WILL CALL YOU TO SCHEDULE ONCE THIS HAS BEEN APPROVED BY YOUR INSURANCE   Follow-Up: Your physician recommends that you schedule a follow-up appointment in: 1 MONTH   Any Other Special Instructions Will Be Listed Below (If Applicable).  Cardiac Nuclear Scan A cardiac nuclear scan is a test that measures blood flow to the heart when a person is resting and when he or she is exercising. The test looks for problems such as:  Not enough blood reaching a portion of the heart.  The heart muscle not working normally.  You may need this test if:  You have heart disease.  You have had abnormal lab results.  You have had heart surgery or angioplasty.  You have chest pain.  You have shortness of breath.  In this test, a radioactive dye (tracer) is injected into your bloodstream. After the tracer has traveled to your heart, an imaging device is used to measure how much of the tracer is absorbed by or distributed to various areas of your heart. This procedure is usually done at a hospital and takes 2-4 hours. Tell a health care provider about:  Any allergies you have.  All medicines you are taking, including vitamins, herbs, eye drops, creams, and  over-the-counter medicines.  Any problems you or family members have had with the use of anesthetic medicines.  Any blood disorders you have.  Any surgeries you have had.  Any medical conditions you have.  Whether you are pregnant or may be pregnant. What are the risks? Generally, this is a safe procedure. However, problems may occur, including:  Serious chest pain and heart attack. This is only a risk if the stress portion of the test is done.  Rapid heartbeat.  Sensation of warmth in your chest. This usually passes quickly.  What happens before the procedure?  Ask your health care provider about changing or stopping your regular medicines. This is especially important if you are taking diabetes medicines or blood thinners.  Remove your jewelry on the day of the procedure. What happens during the procedure?  An IV tube will be inserted into one of your veins.  Your health care provider will inject a small amount of radioactive tracer through the tube.  You will wait for 20-40 minutes while the tracer travels through your bloodstream.  Your heart activity will be monitored with an electrocardiogram (ECG).  You will lie down on an exam table.  Images of your heart will be taken for about 15-20 minutes.  You may be asked to exercise on a treadmill or stationary bike. While you exercise, your heart's activity will be monitored with an ECG, and your blood pressure will be checked. If you are unable to exercise, you may be given a medicine to increase blood flow to parts of your heart.  When blood flow to your heart has peaked, a tracer will again be injected through the IV tube.  After 20-40 minutes, you will get back on the exam table and have more images taken of your heart.  When the procedure is over, your IV tube will be removed. The procedure may vary among health care providers and hospitals. Depending on the type of tracer used, scans may need to be repeated 3-4  hours later. What happens after the procedure?  Unless your health care provider tells you otherwise, you may return to your normal schedule, including diet, activities, and medicines.  Unless your health care provider tells you otherwise, you may increase your fluid intake. This will help flush the contrast dye from your body. Drink enough fluid to keep your urine clear or pale yellow.  It is up to you to get your test results. Ask your health care provider, or the department that is doing the test, when your results will be ready. Summary  A cardiac nuclear scan measures the blood flow to the heart when a person is resting and when he or she is exercising.  You may need this test if you are at risk for heart disease.  Tell your health care provider if you are pregnant.  Unless your health care provider tells you otherwise, increase your fluid intake. This will help flush the contrast dye from your body. Drink enough fluid to keep your urine clear or pale yellow. This information is not intended to replace advice given to you by your health care provider. Make sure you discuss any questions you have with your health care provider. Document Released: 08/27/2004 Document Revised: 08/04/2016 Document Reviewed: 07/11/2013 Elsevier Interactive Patient Education  2017 [place].  Sleep Studies A sleep study (polysomnogram) is a series of tests done while you are sleeping. It can show how well you sleep. This can help your health care provider diagnose a sleep disorder and show how severe your sleep disorder is. A sleep study may lead to treatment that will help you sleep better and prevent other medical problems caused by poor sleep. If you have a sleep disorder, you may also be at risk for:  Sleep-related accidents.  High blood pressure.  Heart disease.  Stroke.  Other medical conditions.  Sleep disorders are common. Your health care provider may suspect a sleep disorder if  you:  Have loud snoring most nights.  Have brief periods when you stop breathing at night.  Feel sleepy on most days.  Fall asleep suddenly during the day.  Have trouble falling asleep or staying asleep.  Feel like you need to move your legs when trying to fall asleep.  Have dreams that seem very real shortly after falling asleep.  Feel like you cannot move when you first wake up.  Which tests will I need to have? Most sleep studies last all night and include these tests:  Recordings of your brain activity.  Recordings of your eye movements.  Recording of your heart rate and rhythm.  Blood pressure readings.  Readings of the amount of oxygen in your blood.  Measurements of your chest and belly movement as you breathe during sleep.  If you have signs of the sleep disorder called sleep apnea during your test, you may get a mask to wear for the second half of the night.  The mask provides continuous positive airway pressure (CPAP). This may improve sleep apnea significantly.  You will then have all tests done again  with the mask in place to see if your measurements and recordings change.  How are sleep studies done? Most sleep studies are done over one full night of sleep.  You will arrive at the study center in the evening and can go home in the morning.  Bring your pajamas and toothbrush.  Do not have caffeine on the day of your sleep study.  Your health care provider will let you know if you need to stop taking any of your regular medicines before the test.  To do the tests included in a polysomnogram, you will have:  Round, sticky patches with sensors attached to recording wires (electrodes) placed on your scalp, face, chest, and limbs.  Wires from all the electrodes and sensors run from your bed to a computer. The wires can be taken off and put back on if you need to get out of bed to go to the bathroom.  A sensor placed over your nose to measure  airflow.  A finger clip put on one finger to measure your blood oxygen level.  A belt around your belly and a belt around your chest to measure breathing movements.  Where are sleep studies done? Sleep studies are done at sleep centers. A sleep center may be inside a hospital, office, or clinic. The room where you have the study may look like a hospital room or a hotel room. The health care providers doing the study may come in and out of the room during the study. Most of the time, they will be in another room monitoring your test. How is information from sleep studies helpful? A polysomnogram can be used along with your medical history and a physical exam to diagnose conditions, such as:  Sleep apnea.  Restless legs syndrome.  Sleep-related seizure disorders.  Sleep-related movement disorders.  A medical doctor who specializes in sleep will evaluate your sleep study. The specialist will share the results with your primary health care provider. Treatments based on your sleep study may include:  Improving your sleep habits (sleep hygiene).  Wearing a CPAP mask.  Wearing an oral device at night to improve breathing and reduce snoring.  Taking medicine for: ? Restless legs syndrome. ? Sleep-related seizure disorder. ? Sleep-related movement disorder.  This information is not intended to replace advice given to you by your health care provider. Make sure you discuss any questions you have with your health care provider. Document Released: 02/06/2003 Document Revised: 03/28/2016 Document Reviewed: 10/08/2013 Elsevier Interactive Patient Education  [employer].

## 2018-05-17 NOTE — Progress Notes (Signed)
Cardiology Office Note   Date:  05/17/2018   ID:  Devora Newman, DOB 1955/12/19, MRN [id number]  PCP:  Cassandria Anger, MD  Cardiologist:   Karina Latch, MD   No chief complaint on file.    History of Present Illness: Karina Newman is a 62 y.o. female with hypertension, autoimmune gastritis, and hyperthyroidism who is being seen today for the evaluation of chest pain and abnormal EKG at the request of Plotnikov, Evie Lacks, MD.  Karina Newman saw Dr. Alain Newman 04/2018 and reported L sided chest pain.  EKG at that time revealed sinus rhythm with LVH and nonspecific T wave abnormality's.  Karina Newman reports intermittent episodes of left-sided chest pressure.  It feels like a muscle spasm.  It is in the central chest and under her left breast.  It occurs sporadically and last for several minutes at a time.  She sometimes notes it when lying down or when sitting.  It is a most always at rest.  She does not exert herself much due to chronic pain.  Her husband does all of the housework and she does not walk up and down stairs often.  The pain is 7-8 out of 10 in severity.  There is no associated nausea or diaphoresis.  There is no associated shortness of breath.  Ms. Karina Newman husband recently informed her that she snores.  She finds herself waking up gasping for air.  She does not feel rested when she wakes up.  Her sleep cycles are abnormal.  She worked night shift for 25 years.  Since then she has been unable to adjust to a typical day/night schedule.  She often sleeps during the day and is awake all night.  She only sleeps 4 to 6 hours at a time.  She falls asleep easily throughout the day.  She notes that her diet is poor.  She ate a lot of fast food and fried foods during her work life and has not made any changes.  She has not noted any lower extremity edema, orthopnea, or PND.  She denies any palpitations, lightheadedness or dizziness.  She is previously treated by cardiologist in [place]  for mitral valve prolapse.  She recalls being on a medication for many years.  She is unsure why was discontinued.  She had an echocardiogram last in 2014 that revealed trace mitral regurgitation and no evidence of mitral valve prolapse.  Plantar pain at rest 4-5 months  No palpitations MVP Took meds for MVP.  Saw Cards in [place]  Coffee 8 oz/day No OTCs    Past Medical History:  Diagnosis Date  . Abnormal cervical Pap smear with positive HPV DNA test 01/2014,01/2017   2015 Normal cytology with positive high-risk HPV 18/45. Colposcopy negative with negative ECC.  2018 normal cytology with positive high-risk HPV 18/45  . Anemia   . Apnea 05/17/2018  . Autoimmune gastritis   . Fatty liver   . Gastritis   . Heart murmur   . Hypertension   . Hyperthyroidism   . Inappropriate sinus tachycardia 05/17/2018  . Internal hemorrhoids   . Right shoulder pain 2001   chronic pain  . Thyroid disease    Hyperthyroid. Was on PTU (Dr Karina Newman in [place]) - stopped in 2013, stable  . Tubular adenoma of colon     Past Surgical History:  Procedure Laterality Date  . BREAST CYST ASPIRATION Left 2012  . CESAREAN SECTION     x 4   . HAND  SURGERY Left   . SHOULDER SURGERY Right 2002  . SHOULDER SURGERY Left 2014  . TUBAL LIGATION       Current Outpatient Medications  Medication Sig Dispense Refill  . AMBULATORY NON FORMULARY MEDICATION Medication Name: Gi cocktail 5-10 ml every 4-6 hours as needed for pain 450 mL 1  . amLODipine (NORVASC) 5 MG tablet Take 1 tablet (5 mg total) by mouth daily. 90 tablet 3  . Cyanocobalamin (VITAMIN B-12) 500 MCG SUBL 1 sl qd 100 tablet 5  . cyclobenzaprine (FLEXERIL) 5 MG tablet TAKE 1 TABLET BY MOUTH THREE TIMES A DAY AS NEEDED FOR MUSCLE SPASMS 60 tablet 3  . Diclofenac Sodium 1.5 % SOLN PLACE 0.3MLS ONTO THE SKIN 4 TIMES DAILY AS NEEDED 150 mL 2  . dicyclomine (BENTYL) 10 MG capsule TAKE 1 CAPSULE (10 MG TOTAL) BY MOUTH EVERY 8 (EIGHT) HOURS AS NEEDED FOR SPASMS. 60  capsule 2  . fluticasone (FLONASE) 50 MCG/ACT nasal spray Place 1 spray into both nostrils as needed. 16 g 11  . gabapentin (NEURONTIN) 100 MG capsule Take 1 capsule (100 mg total) by mouth 3 (three) times daily as needed. 90 capsule 3  . omeprazole (PRILOSEC) 40 MG capsule TAKE 1 CAPSULE BY MOUTH EVERY DAY 90 capsule 3  . potassium chloride (KLOR-CON 10) 10 MEQ tablet Take 1 tablet (10 mEq total) by mouth daily. 30 tablet 5  . sucralfate (CARAFATE) 1 g tablet TAKE 1 TABLET BY MOUTH 3 TIMES A DAY BETWEEN MEALS 90 tablet 11  . telmisartan-hydrochlorothiazide (MICARDIS HCT) 80-25 MG tablet Take 1 tablet by mouth daily. 90 tablet 3  . valACYclovir (VALTREX) 500 MG tablet Take 1 tablet (500 mg total) by mouth 2 (two) times daily. For 5 days with outbreak 30 tablet 1  . Vitamin D, Cholecalciferol, 1000 units CAPS Take 2,000 Units by mouth.    . carvedilol (COREG) 12.5 MG tablet Take 1 tablet (12.5 mg total) by mouth 2 (two) times daily. 180 tablet 3   Current Facility-Administered Medications  Medication Dose Route Frequency Provider Last Rate Last Dose  . 0.9 %  sodium chloride infusion  500 mL Intravenous Continuous Armbruster, Carlota Raspberry, MD        Allergies:   Penicillins; Gabapentin; Iodine; Lyrica [pregabalin]; Nsaids; Tramadol; and Voltaren [diclofenac]    Social History:  The patient  reports that she has never smoked. She has never used smokeless tobacco. She reports that she does not drink alcohol or use drugs.   Family History:  The patient's family history includes Cancer in her father; Diabetes in her brother and paternal grandmother; Hypertension in her brother, mother, sister, and sister; Kidney disease in her mother.    ROS:  Please see the history of present illness.   Otherwise, review of systems are positive for none.   All other systems are reviewed and negative.    PHYSICAL EXAM: VS:  BP (!) 144/88   Pulse (!) 111   Ht 5' 4.5" (1.638 m)   Wt 175 lb (79.4 kg)   SpO2 98%    BMI 29.57 kg/m  , BMI Body mass index is 29.57 kg/m. GENERAL:  Well appearing HEENT:  Pupils equal round and reactive, fundi not visualized, oral mucosa unremarkable NECK:  No jugular venous distention, waveform within normal limits, carotid upstroke brisk and symmetric, no bruits, no thyromegaly LYMPHATICS:  No cervical adenopathy LUNGS:  Clear to auscultation bilaterally HEART:  RRR.  PMI not displaced or sustained,S1 and S2 within normal limits,  no S3, no S4, no clicks, no rubs, no murmurs ABD:  Flat, positive bowel sounds normal in frequency in pitch, no bruits, no rebound, no guarding, no midline pulsatile mass, no hepatomegaly, no splenomegaly EXT:  2 plus pulses throughout, no edema, no cyanosis no clubbing SKIN:  No rashes no nodules NEURO:  Cranial nerves II through XII grossly intact, motor grossly intact throughout PSYCH:  Cognitively intact, oriented to person place and time   EKG:  EKG is not ordered today. The ekg ordered 04/18/18 demonstrates sinus rhythm.  Rate 81 bpm. Non-specific ST changes.   Echo 03/22/13: LVEF 55%.  Moderate LVH.  Grade 1 diastolic dysfunction. Trace MR.  Moderate TR.    Recent Labs: 04/18/2018: ALT 24; BUN 11; Creatinine, Ser 0.92; Hemoglobin 13.3; Platelets 336.0; Potassium 3.3; Sodium 137; TSH 0.71    Lipid Panel    Component Value Date/Time   CHOL 183 04/18/2018 1527   TRIG 73.0 04/18/2018 1527   HDL 36.20 (L) 04/18/2018 1527   CHOLHDL 5 04/18/2018 1527   VLDL 14.6 04/18/2018 1527   LDLCALC 132 (H) 04/18/2018 1527      Wt Readings from Last 3 Encounters:  05/17/18 175 lb (79.4 kg)  05/05/18 176 lb 3.2 oz (79.9 kg)  05/02/18 174 lb (78.9 kg)      ASSESSMENT AND PLAN:  # Chest pain: Ms. Primiano's symptoms are atypical.  She has no exertional symptoms but doesn't exert herself.  Given her risk factors of hypertension and hyperlipidemia we will get an exercise Myoveiw to better assess for ischemia.  # Hypertension: Blood pressure is  elevated both initially and on repeat.  We will plan to start carvedilol 12.5 mg twice daily.  This should also help with her tachycardia.  However, given that she is getting an exercise Myoview we will not start the beta-blocker until after her stress test.  For now increase amlodipine to 10 mg daily.  She will reduce this back to 5 mg after her stress test.  # Apnea:  Check sleep study given snoring, apnea an daytime somnolence.   # Tachycardia:  Ms. Medel has asymptomatic sinus tachycardia.  She is not feeling stressed or anxious.  Although she has chronic pain she reports that her pain is well having control at this time.  Thyroid function and blood counts are normal as of last month.  She only drinks one coffee daily.  This is likely inappropriate sinus tachycardia.  We are starting carvedilol as above.  Current medicines are reviewed at length with the patient today.  The patient does not have concerns regarding medicines.  The following changes have been made:  Start carvedilol.   Labs/ tests ordered today include:   Orders Placed This Encounter  Procedures  . MYOCARDIAL PERFUSION IMAGING  . Split night study     Disposition:   FU with Jamilyn Pigeon C. Oval Linsey, MD, [facility] in 4-6 weeks.      Signed, Rasa Degrazia C. Oval Linsey, MD, [facility]  05/17/2018 2:51 PM    [facility]

## 2018-05-18 ENCOUNTER — Telehealth: Payer: Self-pay | Admitting: *Deleted

## 2018-05-18 NOTE — Telephone Encounter (Signed)
PA submitted to [employer] via phone. Clinicals faxed to [phone].

## 2018-05-23 ENCOUNTER — Telehealth: Payer: Self-pay | Admitting: *Deleted

## 2018-05-23 NOTE — Telephone Encounter (Signed)
Patient informed PA approved to have sleep study. Appointment scheduled 06/27/18. Patient states she has a problem "falling asleep." I told her that I will inform the ordering MD, Dr Oval Linsey to see if she will order a one time for her to take the night of the sleep study. Message routed to Dr Oval Linsey for review.

## 2018-05-24 ENCOUNTER — Telehealth (HOSPITAL_COMMUNITY): Payer: Self-pay

## 2018-05-24 NOTE — Telephone Encounter (Signed)
Encounter complete. 

## 2018-05-25 ENCOUNTER — Ambulatory Visit (AMBULATORY_SURGERY_CENTER): Payer: Federal, State, Local not specified - PPO | Admitting: Gastroenterology

## 2018-05-25 ENCOUNTER — Encounter: Payer: Self-pay | Admitting: Gastroenterology

## 2018-05-25 VITALS — BP 114/77 | HR 91 | Temp 99.1°F | Resp 16 | Ht 64.5 in | Wt 175.0 lb

## 2018-05-25 DIAGNOSIS — K219 Gastro-esophageal reflux disease without esophagitis: Secondary | ICD-10-CM

## 2018-05-25 DIAGNOSIS — K2970 Gastritis, unspecified, without bleeding: Secondary | ICD-10-CM | POA: Diagnosis not present

## 2018-05-25 DIAGNOSIS — K2940 Chronic atrophic gastritis without bleeding: Secondary | ICD-10-CM | POA: Diagnosis not present

## 2018-05-25 DIAGNOSIS — K2950 Unspecified chronic gastritis without bleeding: Secondary | ICD-10-CM

## 2018-05-25 DIAGNOSIS — R109 Unspecified abdominal pain: Secondary | ICD-10-CM | POA: Diagnosis not present

## 2018-05-25 MED ORDER — SODIUM CHLORIDE 0.9 % IV SOLN: 500.0000 mL | Freq: Once | INTRAVENOUS | Status: DC

## 2018-05-25 NOTE — Progress Notes (Signed)
A/ox3 pleased with MAC, report to RN 

## 2018-05-25 NOTE — Op Note (Signed)
[place] Patient Name: Karina Newman Procedure Date: 05/25/2018 4:23 PM MRN: [id number] Endoscopist: Remo Lipps P. Havery Moros , MD Age: 62 Referring MD:  Date of Birth: 1956/05/12 Gender: Female Account #: [id number] Procedure:                Upper GI endoscopy Indications:              Epigastric abdominal pain / dyspepsia, Follow-up of                            autoimmune gastritis / pernicious anemia, patient                            also with some reflux symptoms - on omeprazole 40mg                             once daily Medicines:                Monitored Anesthesia Care Procedure:                Pre-Anesthesia Assessment:                           - Prior to the procedure, a History and Physical                            was performed, and patient medications and                            allergies were reviewed. The patient's tolerance of                            previous anesthesia was also reviewed. The risks                            and benefits of the procedure and the sedation                            options and risks were discussed with the patient.                            All questions were answered, and informed consent                            was obtained. Prior Anticoagulants: The patient has                            taken no previous anticoagulant or antiplatelet                            agents. ASA Grade Assessment: II - A patient with                            mild systemic disease. After reviewing the risks  and benefits, the patient was deemed in                            satisfactory condition to undergo the procedure.                           After obtaining informed consent, the endoscope was                            passed under direct vision. Throughout the                            procedure, the patient's blood pressure, pulse, and                            oxygen saturations were monitored  continuously. The                            Endoscope was introduced through the mouth, and                            advanced to the second part of duodenum. The upper                            GI endoscopy was accomplished without difficulty.                            The patient tolerated the procedure well. Scope In: Scope Out: Findings:                 Esophagogastric landmarks were identified: the                            Z-line was found at 36 cm, the gastroesophageal                            junction was found at 36 cm and the upper extent of                            the gastric folds was found at 37 cm from the                            incisors.                           A 1 cm hiatal hernia was present.                           The exam of the esophagus was otherwise normal.                           Mildly atrophic appearing mucosa was found in the  entire examined stomach. No nodularity or polyps                            appreciated.                           The exam was otherwise without abnormality.                           Biopsies were taken with a cold forceps in the                            gastric fundus, in the gastric body, at the                            incisura and in the gastric antrum for histology -                            rule out H pylori, intestinal metaplasia /                            dysplasia.                           The duodenal bulb and second portion of the                            duodenum were normal. Complications:            No immediate complications. Estimated blood loss:                            Minimal. Estimated Blood Loss:     Estimated blood loss was minimal. Impression:               - Esophagogastric landmarks identified.                           - 1 cm hiatal hernia.                           - Normal esophagus otherwise                           - Mildy atrophic mucosa of the  stomach                           - The examination was otherwise normal.                           - Biopsies taken of the antrum / body / insicura /                            fundus to rule out intestinal metaplasia / H pylori                            /  dysplasia                           - Normal duodenal bulb and second portion of the                            duodenum. Recommendation:           - Patient has a contact number available for                            emergencies. The signs and symptoms of potential                            delayed complications were discussed with the                            patient. Return to normal activities tomorrow.                            Written discharge instructions were provided to the                            patient.                           - Resume previous diet.                           - Continue present medications.                           - Trial of increasing omeprazole to twice daily, or                            trial of options for suspected dyspepsia                           - Await pathology results. Remo Lipps P. Jule Whitsel, MD 05/25/2018 4:54:15 PM This report has been signed electronically.

## 2018-05-25 NOTE — Telephone Encounter (Signed)
OK for [employer] 10mg  x1

## 2018-05-25 NOTE — Patient Instructions (Signed)
Please read handout on hiatal hernia. Increase taking Omeprazole 40 mg to twice daily. Await pathology results.      YOU HAD AN ENDOSCOPIC PROCEDURE TODAY AT [facility]:   Refer to the procedure report that was given to you for any specific questions about what was found during the examination.  If the procedure report does not answer your questions, please call your gastroenterologist to clarify.  If you requested that your care partner not be given the details of your procedure findings, then the procedure report has been included in a sealed envelope for you to review at your convenience later.  YOU SHOULD EXPECT: Some feelings of bloating in the abdomen. Passage of more gas than usual.  Walking can help get rid of the air that was put into your GI tract during the procedure and reduce the bloating. If you had a lower endoscopy (such as a colonoscopy or flexible sigmoidoscopy) you may notice spotting of blood in your stool or on the toilet paper. If you underwent a bowel prep for your procedure, you may not have a normal bowel movement for a few days.  Please Note:  You might notice some irritation and congestion in your nose or some drainage.  This is from the oxygen used during your procedure.  There is no need for concern and it should clear up in a day or so.  SYMPTOMS TO REPORT IMMEDIATELY:    Following upper endoscopy (EGD)  Vomiting of blood or coffee ground material  New chest pain or pain under the shoulder blades  Painful or persistently difficult swallowing  New shortness of breath  Fever of 100F or higher  Black, tarry-looking stools  For urgent or emergent issues, a gastroenterologist can be reached at any hour by calling [phone].   DIET:  We do recommend a small meal at first, but then you may proceed to your regular diet.  Drink plenty of fluids but you should avoid alcoholic beverages for 24 hours.  ACTIVITY:  You should plan to take it easy  for the rest of today and you should NOT DRIVE or use heavy machinery until tomorrow (because of the sedation medicines used during the test).    FOLLOW UP: Our staff will call the number listed on your records the next business day following your procedure to check on you and address any questions or concerns that you may have regarding the information given to you following your procedure. If we do not reach you, we will leave a message.  However, if you are feeling well and you are not experiencing any problems, there is no need to return our call.  We will assume that you have returned to your regular daily activities without incident.  If any biopsies were taken you will be contacted by phone or by letter within the next 1-3 weeks.  Please call us at [phone] if you have not heard about the biopsies in 3 weeks.    SIGNATURES/CONFIDENTIALITY: You and/or your care partner have signed paperwork which will be entered into your electronic medical record.  These signatures attest to the fact that that the information above on your After Visit Summary has been reviewed and is understood.  Full responsibility of the confidentiality of this discharge information lies with you and/or your care-partner.

## 2018-05-25 NOTE — Progress Notes (Signed)
Pt's states no medical or surgical changes since previsit or office visit. 

## 2018-05-25 NOTE — Progress Notes (Signed)
Called to room to assist during endoscopic procedure.  Patient ID and intended procedure confirmed with present staff. Received instructions for my participation in the procedure from the performing physician.  

## 2018-05-26 ENCOUNTER — Telehealth: Payer: Self-pay

## 2018-05-26 MED ORDER — ZOLPIDEM TARTRATE 10 MG PO TABS: ORAL_TABLET | ORAL | 0 refills | Status: DC

## 2018-05-26 NOTE — Telephone Encounter (Signed)
Follow up call, no answer. Does not have permission to leave a message.

## 2018-05-26 NOTE — Telephone Encounter (Signed)
  Follow up Call-  Call Karina Newman number 05/25/2018 05/06/2016  Post procedure Call [name] phone  # [phone]// [phone]  Permission to leave phone message No Yes  Some recent data might be hidden     Patient questions:  Do you have a fever, pain , or abdominal swelling? Yes.   Pain Score  3 *  Have you tolerated food without any problems? Yes.    Have you been able to return to your normal activities? Yes.    Do you have any questions about your discharge instructions: Diet   No. Medications  No. Follow up visit  No.  Do you have questions or concerns about your Care? No.  Actions: * If pain score is 4 or above: No action needed, pain <4.  Pt complains of throat feeling sore and scratchy, but states it feels a lot better today than yesterday. Pt has gargled warm salt water and states that has helping. Instructed patient a sore scratchy throat is normal and it should go away in a couple days. Patient verbalized understanding.

## 2018-05-26 NOTE — Addendum Note (Signed)
Addended by: Alvina Filbert B on: 05/26/2018 09:50 AM   Modules accepted: Orders

## 2018-05-26 NOTE — Telephone Encounter (Signed)
Advised patient and asked that she call sleep center [phone]) to find out what time to take, upon arrival or prior to. Verbalized understanding. Called [facility] and left on voicemail

## 2018-05-29 ENCOUNTER — Other Ambulatory Visit: Payer: Self-pay | Admitting: Cardiovascular Disease

## 2018-05-29 MED ORDER — AMLODIPINE BESYLATE 5 MG PO TABS: 10.0000 mg | ORAL_TABLET | Freq: Every day | ORAL | 5 refills | Status: DC

## 2018-05-29 NOTE — Telephone Encounter (Signed)
° ° °  Patient states she is out of medication, she was taking 10mg  not 5mg    1. Which medications need to be refilled? (please list name of each medication and dose if known) amLODipine ([place])  2. Which pharmacy/location (including street and city if local pharmacy) is medication to be sent to?[id number] - [place], [place] - [address] AT [place]  3. Do they need a 30 day or 90 day supply? [place]

## 2018-05-29 NOTE — Telephone Encounter (Signed)
Spoke with patient about amlodipine. MD had increased from 5 to 10mg  at last visit ONLY until stress test completed, at which time she would decrease to 5mg  and add coreg (as noted below). She states she had been doubling up on her medication but has run out and insurance will not cover as her Rx says 1 pill QD. Rx(s) sent to pharmacy electronically.    # Hypertension: Blood pressure is elevated both initially and on repeat.  We will plan to start carvedilol 12.5 mg twice daily.  This should also help with her tachycardia.  However, given that she is getting an exercise Myoview we will not start the beta-blocker until after her stress test.  For now increase amlodipine to 10 mg daily.  She will reduce this back to 5 mg after her stress test.

## 2018-05-30 ENCOUNTER — Encounter: Payer: Self-pay | Admitting: *Deleted

## 2018-05-30 ENCOUNTER — Telehealth: Payer: Self-pay | Admitting: *Deleted

## 2018-05-30 ENCOUNTER — Ambulatory Visit (HOSPITAL_COMMUNITY)
Admission: RE | Admit: 2018-05-30 | Discharge: 2018-05-30 | Disposition: A | Discharge status: Home / Self Care | Payer: Federal, State, Local not specified - PPO | Source: Ambulatory Visit | Attending: Cardiovascular Disease | Admitting: Cardiovascular Disease

## 2018-05-30 DIAGNOSIS — Z01812 Encounter for preprocedural laboratory examination: Secondary | ICD-10-CM

## 2018-05-30 DIAGNOSIS — R079 Chest pain, unspecified: Secondary | ICD-10-CM

## 2018-05-30 DIAGNOSIS — R0789 Other chest pain: Secondary | ICD-10-CM

## 2018-05-30 DIAGNOSIS — R000 Tachycardia, unspecified: Secondary | ICD-10-CM

## 2018-05-30 DIAGNOSIS — I10 Essential (primary) hypertension: Secondary | ICD-10-CM

## 2018-05-30 LAB — MYOCARDIAL PERFUSION IMAGING
CHL CUP MPHR: 159 {beats}/min
CHL CUP NUCLEAR SSS: 5
CHL CUP RESTING HR STRESS: 85 {beats}/min
CSEPED: 5 min
CSEPEDS: 0 s
CSEPHR: 97 %
Estimated workload: 5.8 METS
LV dias vol: 87 mL (ref 46–106)
LV sys vol: 40 mL
NUC STRESS TID: 1.2
Peak HR: 155 {beats}/min
RPE: 19
SDS: 3
SRS: 2

## 2018-05-30 MED ORDER — TECHNETIUM TC 99M TETROFOSMIN IV KIT
29.6000 | PACK | Freq: Once | INTRAVENOUS | Status: AC | PRN
  Administered 2018-05-30: 29.6 via INTRAVENOUS
  Filled 2018-05-30: qty 30

## 2018-05-30 MED ORDER — TECHNETIUM TC 99M TETROFOSMIN IV KIT
10.1000 | PACK | Freq: Once | INTRAVENOUS | Status: AC | PRN
  Administered 2018-05-30: 10.1 via INTRAVENOUS
  Filled 2018-05-30: qty 11

## 2018-05-30 NOTE — Telephone Encounter (Signed)
Advised patient, verbalized understanding. Sent to precert and scheduling.

## 2018-05-30 NOTE — Telephone Encounter (Signed)
-----   Message from Skeet Latch, MD sent at 05/30/2018  3:50 PM EDT ----- Stress test was mildly abnormal.  Recommend coronary CT-A to better assess.  Her thyroid function is normal and OK to receive contrast.

## 2018-06-20 ENCOUNTER — Telehealth: Payer: Self-pay

## 2018-06-20 ENCOUNTER — Other Ambulatory Visit: Payer: Federal, State, Local not specified - PPO

## 2018-06-20 NOTE — Telephone Encounter (Signed)
Pt came in today to have her lab drawn for anticipation of her Cardiac CT schedule on 06/26/18. Pt is scheduled to see Dr.Mission and wants to know if her appt needs to be rescheduled until after her testing is completed. Adv  that I will fwd the message to Dr.Levan's nurse to call her to adv.

## 2018-06-21 LAB — BASIC METABOLIC PANEL
BUN / CREAT RATIO: 12 (ref 12–28)
BUN: 11 mg/dL (ref 8–27)
CHLORIDE: 97 mmol/L (ref 96–106)
CO2: 25 mmol/L (ref 20–29)
CREATININE: 0.92 mg/dL (ref 0.57–1.00)
Calcium: 9.5 mg/dL (ref 8.7–10.3)
GFR calc non Af Amer: 67 mL/min/{1.73_m2} (ref >59)
GFR, EST AFRICAN AMERICAN: 78 mL/min/{1.73_m2} (ref >59)
Glucose: 78 mg/dL (ref 65–99)
Potassium: 4.3 mmol/L (ref 3.5–5.2)
SODIUM: 140 mmol/L (ref 134–144)

## 2018-06-21 NOTE — Telephone Encounter (Signed)
Rescheduled follow up until after CT scan, patient aware of date and time

## 2018-06-21 NOTE — Telephone Encounter (Signed)
  Patient is having a CT angiogram on 06/26/18 and would like to know if she should still come tomorrow to see Dr Oval Linsey or reschedule for after the CT

## 2018-06-22 ENCOUNTER — Ambulatory Visit: Payer: Federal, State, Local not specified - PPO | Admitting: Cardiovascular Disease

## 2018-06-26 ENCOUNTER — Ambulatory Visit (HOSPITAL_COMMUNITY)
Admission: RE | Admit: 2018-06-26 | Discharge: 2018-06-26 | Disposition: A | Discharge status: Home / Self Care | Payer: Federal, State, Local not specified - PPO | Source: Ambulatory Visit | Attending: Cardiovascular Disease | Admitting: Cardiovascular Disease

## 2018-06-26 DIAGNOSIS — I10 Essential (primary) hypertension: Secondary | ICD-10-CM

## 2018-06-26 DIAGNOSIS — R0789 Other chest pain: Secondary | ICD-10-CM | POA: Insufficient documentation

## 2018-06-26 DIAGNOSIS — I119 Hypertensive heart disease without heart failure: Secondary | ICD-10-CM | POA: Diagnosis not present

## 2018-06-26 DIAGNOSIS — R000 Tachycardia, unspecified: Secondary | ICD-10-CM | POA: Insufficient documentation

## 2018-06-26 DIAGNOSIS — J984 Other disorders of lung: Secondary | ICD-10-CM | POA: Insufficient documentation

## 2018-06-26 DIAGNOSIS — R079 Chest pain, unspecified: Secondary | ICD-10-CM | POA: Diagnosis present

## 2018-06-26 MED ORDER — NITROGLYCERIN 0.4 MG SL SUBL
SUBLINGUAL_TABLET | SUBLINGUAL | Status: AC
  Filled 2018-06-26: qty 2

## 2018-06-26 MED ORDER — METOPROLOL TARTRATE 5 MG/5ML IV SOLN
INTRAVENOUS | Status: AC
  Filled 2018-06-26: qty 20

## 2018-06-26 MED ORDER — METOPROLOL TARTRATE 5 MG/5ML IV SOLN: 5.0000 mg | INTRAVENOUS | Status: DC | PRN

## 2018-06-26 MED ORDER — IOPAMIDOL (ISOVUE-370) INJECTION 76%
100.0000 mL | Freq: Once | INTRAVENOUS | Status: AC | PRN
  Administered 2018-06-26: 100 mL via INTRAVENOUS

## 2018-06-26 MED ORDER — NITROGLYCERIN 0.4 MG SL SUBL
0.8000 mg | SUBLINGUAL_TABLET | Freq: Once | SUBLINGUAL | Status: AC
  Administered 2018-06-26: 0.8 mg via SUBLINGUAL

## 2018-06-26 MED ORDER — METOPROLOL TARTRATE 5 MG/5ML IV SOLN
10.0000 mg | INTRAVENOUS | Status: AC | PRN
  Administered 2018-06-26 (×2): 10 mg via INTRAVENOUS

## 2018-06-27 ENCOUNTER — Encounter (HOSPITAL_BASED_OUTPATIENT_CLINIC_OR_DEPARTMENT_OTHER): Payer: Self-pay

## 2018-06-29 ENCOUNTER — Ambulatory Visit: Payer: Federal, State, Local not specified - PPO | Admitting: Cardiovascular Disease

## 2018-06-29 ENCOUNTER — Encounter: Payer: Self-pay | Admitting: Cardiovascular Disease

## 2018-06-29 VITALS — BP 148/82 | HR 85 | Resp 16 | Ht 64.0 in | Wt 177.0 lb

## 2018-06-29 DIAGNOSIS — R000 Tachycardia, unspecified: Secondary | ICD-10-CM

## 2018-06-29 DIAGNOSIS — I10 Essential (primary) hypertension: Secondary | ICD-10-CM

## 2018-06-29 DIAGNOSIS — R079 Chest pain, unspecified: Secondary | ICD-10-CM | POA: Diagnosis not present

## 2018-06-29 MED ORDER — DOXAZOSIN MESYLATE 4 MG PO TABS: 4.0000 mg | ORAL_TABLET | Freq: Every day | ORAL | 1 refills | Status: DC

## 2018-06-29 NOTE — Progress Notes (Signed)
Cardiology Office Note   Date:  06/29/2018   ID:  Karina Newman, DOB 1956/05/19, MRN [id number]  PCP:  Cassandria Anger, MD  Cardiologist:   Skeet Latch, MD   Chief Complaint  Newman presents with  . Chest Pain  . Edema     History of Present Illness: Karina Newman is a 62 y.o. female with hypertension, autoimmune gastritis, and hyperthyroidism here for follow up.  She was initially seen for Karina evaluation of chest pain and abnormal EKG.  Karina Newman saw Dr. Alain Marion 04/2018 and reported L sided chest pain.  EKG at that time revealed sinus rhythm with LVH and nonspecific T wave abnormality's.  Karina Newman reported intermittent L sided chest pain that felt like spasm.  She was referred for an exercise Myoview 05/2018 that revealed LVEF 54% with 1 mm ST depression with exertion.  Perfusion imaging showed a small, mild defect in Karina basal to mid anteroseptal and apical septal regions.  It was unclear whether this represented breast attenuation artifact or ischemia.  TID was 1.2.  She was referred for coronary CT-A 06/2018 that showed no CAD.  Coronary calcium score was 0.    Since her last appointment Karina Newman has been well.  She had some lightheadedness and chest pain yesterday that occurred at rest.  She is trying to walk more but has struggled with plantar pain for Karina last 4-5 months.  She is frustrated that she is gaining weight despite trying to limit fried foods and sugar.  She ha not been tracking her BP at home.  She denies palpitations and her breathing has been stable.  She notes that she previously saw a cardiologist in [place] due to mitral valve prolapse and was previously on a medication for this.  She is unsure of Karina medicine's name.   Past Medical History:  Diagnosis Date  . Abnormal cervical Pap smear with positive HPV DNA test 01/2014,01/2017   2015 Normal cytology with positive high-risk HPV 18/45. Colposcopy negative with negative ECC.  2018 normal  cytology with positive high-risk HPV 18/45  . Anemia   . Apnea 05/17/2018  . Autoimmune gastritis   . Fatty liver   . Gastritis   . Heart murmur   . Hypertension   . Hyperthyroidism   . Inappropriate sinus tachycardia 05/17/2018  . Internal hemorrhoids   . Right shoulder pain 2001   chronic pain  . Thyroid disease    Hyperthyroid. Was on PTU (Dr Hampton Abbot in [place]) - stopped in 2013, stable  . Tubular adenoma of colon     Past Surgical History:  Procedure Laterality Date  . BREAST CYST ASPIRATION Left 2012  . CESAREAN SECTION     x 4   . HAND SURGERY Left   . SHOULDER SURGERY Right 2002  . SHOULDER SURGERY Left 2014  . TUBAL LIGATION       Current Outpatient Medications  Medication Sig Dispense Refill  . amLODipine (NORVASC) 5 MG tablet Take 2 tablets (10 mg total) by mouth daily. Take 10mg  until stress test completed. 60 tablet 5  . carvedilol (COREG) 12.5 MG tablet Take 1 tablet (12.5 mg total) by mouth 2 (two) times daily. 180 tablet 3  . Cyanocobalamin (VITAMIN B-12) 500 MCG SUBL 1 sl qd 100 tablet 5  . cyclobenzaprine (FLEXERIL) 5 MG tablet TAKE 1 TABLET BY MOUTH THREE TIMES A DAY AS NEEDED FOR MUSCLE SPASMS 60 tablet 3  . Diclofenac Sodium 1.5 % SOLN PLACE 0.3MLS  ONTO Karina SKIN 4 TIMES DAILY AS NEEDED 150 mL 2  . dicyclomine (BENTYL) 10 MG capsule TAKE 1 CAPSULE (10 MG TOTAL) BY MOUTH EVERY 8 (EIGHT) HOURS AS NEEDED FOR SPASMS. 60 capsule 2  . fluticasone (FLONASE) 50 MCG/ACT nasal spray Place 1 spray into both nostrils as needed. 16 g 11  . gabapentin (NEURONTIN) 100 MG capsule Take 1 capsule (100 mg total) by mouth 3 (three) times daily as needed. 90 capsule 3  . omeprazole (PRILOSEC) 40 MG capsule TAKE 1 CAPSULE BY MOUTH EVERY DAY 90 capsule 3  . potassium chloride (KLOR-CON 10) 10 MEQ tablet Take 1 tablet (10 mEq total) by mouth daily. 30 tablet 5  . sucralfate (CARAFATE) 1 g tablet TAKE 1 TABLET BY MOUTH 3 TIMES A DAY BETWEEN MEALS 90 tablet 11  .  telmisartan-hydrochlorothiazide (MICARDIS HCT) 80-25 MG tablet Take 1 tablet by mouth daily. 90 tablet 3  . valACYclovir (VALTREX) 500 MG tablet Take 1 tablet (500 mg total) by mouth 2 (two) times daily. For 5 days with outbreak 30 tablet 1  . Vitamin D, Cholecalciferol, 1000 units CAPS Take 2,000 Units by mouth.    . zolpidem (AMBIEN) 10 MG tablet TAKE 1 TABLET BY MOUTH PRIOR TO STUDY 1 tablet 0   Current Facility-Administered Medications  Medication Dose Route Frequency Provider Last Rate Last Dose  . 0.9 %  sodium chloride infusion  500 mL Intravenous Once Armbruster, Carlota Raspberry, MD        Allergies:   Penicillins; Gabapentin; Iodine; Lyrica [pregabalin]; Nsaids; Tramadol; and Voltaren [diclofenac]    Social History:  Karina Newman  reports that she has never smoked. She has never used smokeless tobacco. She reports that she does not drink alcohol or use drugs.   Family History:  Karina Newman's family history includes Cancer in her father; Diabetes in her brother and paternal grandmother; Hypertension in her brother, mother, sister, and sister; Kidney disease in her mother.    ROS:  Please see Karina history of present illness.   Otherwise, review of systems are positive for none.   All other systems are reviewed and negative.    PHYSICAL EXAM: VS:  BP (!) 142/87   Pulse 85   Resp 16   Ht 5\' 4"  (1.626 m)   Wt 177 lb (80.3 kg)   SpO2 99%   BMI 30.38 kg/m  , BMI Body mass index is 30.38 kg/m. GENERAL:  Well appearing HEENT:  Pupils equal round and reactive, fundi not visualized, oral mucosa unremarkable NECK:  No jugular venous distention, waveform within normal limits, carotid upstroke brisk and symmetric, no bruits LUNGS:  Clear to auscultation bilaterally HEART:  RRR.  PMI not displaced or sustained,S1 and S2 within normal limits, no S3, no S4, no clicks, no rubs, no murmurs ABD:  Flat, positive bowel sounds normal in frequency in pitch, no bruits, no rebound, no guarding, no midline  pulsatile mass, no hepatomegaly, no splenomegaly EXT:  2 plus pulses throughout, no edema, no cyanosis no clubbing SKIN:  No rashes no nodules NEURO:  Cranial nerves II through XII grossly intact, motor grossly intact throughout PSYCH:  Cognitively intact, oriented to person place and time   EKG:  EKG is not ordered today. Karina ekg ordered 04/18/18 demonstrates sinus rhythm.  Rate 81 bpm. Non-specific ST changes.   Echo 03/22/13: LVEF 55%.  Moderate LVH.  Grade 1 diastolic dysfunction. Trace MR.  Moderate TR.   Exercise Myoview 05/30/18:  Karina left ventricular ejection fraction  is mildly decreased (45-54%).  Nuclear stress EF: 54%.  Blood pressure demonstrated a normal response to exercise.  There was 37mm of horizontal ST depression in Karina inferolateral leads at peak exercise with T wave inversions in recovery  There is a small defect of mild severity present in Karina basal anteroseptal, mid anteroseptal and apical septal location. Karina defect is reversible. Cannot rule out small area of ischemia vs. Variations in breast attenuation artifact.  There is transient ischemic dilatation with TID of 1.2 which could indicate multivessel CAD. Consider coronary CTA for further evaulation.  This is an intermediate risk study due to increased TID.  Coronary CT-A 06/26/18: No CAD.  Coronary calcium score 0.  Recent Labs: 04/18/2018: ALT 24; Hemoglobin 13.3; Platelets 336.0; TSH 0.71 06/20/2018: BUN 11; Creatinine, Ser 0.92; Potassium 4.3; Sodium 140    Lipid Panel    Component Value Date/Time   CHOL 183 04/18/2018 1527   TRIG 73.0 04/18/2018 1527   HDL 36.20 (L) 04/18/2018 1527   CHOLHDL 5 04/18/2018 1527   VLDL 14.6 04/18/2018 1527   LDLCALC 132 (H) 04/18/2018 1527      Wt Readings from Last 3 Encounters:  06/29/18 177 lb (80.3 kg)  05/30/18 175 lb (79.4 kg)  05/25/18 175 lb (79.4 kg)      ASSESSMENT AND PLAN:  # Chest pain:  Coronary CT-A was without CAD.  Her symptoms are not  ischemic.   # Hypertension: Blood pressure remains elevated after adding carvedilol.  Add doxazosin 4mg  qhs.  Continue amlodipine, carvedilol, telmisartan and HCTZ.  # Apnea:  Check sleep study given snoring, apnea an daytime somnolence.   # Inappropriate sinus tachycardia:  Improved on carvedilol.  Current medicines are reviewed at length with Karina Newman today.  Karina Newman does not have concerns regarding medicines.  Karina following changes have been made:  Start carvedilol.   Labs/ tests ordered today include:   No orders of Karina defined types were placed in this encounter.    Disposition:   FU with Karina Folino C. Oval Linsey, MD, [facility] in 4-6 weeks.      Signed, Karina Detamore C. Oval Linsey, MD, [facility]  06/29/2018 3:30 PM    [facility]

## 2018-06-29 NOTE — Patient Instructions (Signed)
Medication Instructions:  START DOXAZOSIN 4 MG DAILY   If you need a refill on your cardiac medications before your next appointment, please call your pharmacy.   Lab work: NONE  Testing/Procedures: NONE   Follow-Up: At [facility], you and your health needs are our priority.  As part of our continuing mission to provide you with exceptional heart care, we have created designated Provider Care Teams.  These Care Teams include your primary Cardiologist (physician) and Advanced Practice Providers (APPs -  Physician Assistants and Nurse Practitioners) who all work together to provide you with the care you need, when you need it. You will need a follow up appointment in 1-2 months  You may see DR [facility]  or one of the following Advanced Practice Providers on your designated Care Team:   Kerin Ransom, PA-C Roby Lofts, [place] . Sande Rives, PA-C

## 2018-07-04 ENCOUNTER — Encounter: Payer: Self-pay | Admitting: Cardiovascular Disease

## 2018-07-31 ENCOUNTER — Encounter: Payer: Self-pay | Admitting: Internal Medicine

## 2018-07-31 ENCOUNTER — Ambulatory Visit (INDEPENDENT_AMBULATORY_CARE_PROVIDER_SITE_OTHER): Payer: Federal, State, Local not specified - PPO | Admitting: Internal Medicine

## 2018-07-31 DIAGNOSIS — G43009 Migraine without aura, not intractable, without status migrainosus: Secondary | ICD-10-CM | POA: Diagnosis not present

## 2018-07-31 DIAGNOSIS — K219 Gastro-esophageal reflux disease without esophagitis: Secondary | ICD-10-CM | POA: Diagnosis not present

## 2018-07-31 DIAGNOSIS — I10 Essential (primary) hypertension: Secondary | ICD-10-CM | POA: Diagnosis not present

## 2018-07-31 DIAGNOSIS — M25511 Pain in right shoulder: Secondary | ICD-10-CM

## 2018-07-31 DIAGNOSIS — R7989 Other specified abnormal findings of blood chemistry: Secondary | ICD-10-CM

## 2018-07-31 DIAGNOSIS — E538 Deficiency of other specified B group vitamins: Secondary | ICD-10-CM

## 2018-07-31 DIAGNOSIS — E559 Vitamin D deficiency, unspecified: Secondary | ICD-10-CM

## 2018-07-31 MED ORDER — CYCLOBENZAPRINE HCL 5 MG PO TABS: ORAL_TABLET | ORAL | 5 refills | Status: DC

## 2018-07-31 MED ORDER — DICLOFENAC SODIUM 1.5 % TD SOLN: TRANSDERMAL | 3 refills | Status: DC

## 2018-07-31 MED ORDER — GABAPENTIN 300 MG PO CAPS: 300.0000 mg | ORAL_CAPSULE | Freq: Three times a day (TID) | ORAL | 5 refills | Status: DC

## 2018-07-31 NOTE — Assessment & Plan Note (Signed)
Vit D 

## 2018-07-31 NOTE — Assessment & Plan Note (Signed)
Imitrex, Zofran 

## 2018-07-31 NOTE — Assessment & Plan Note (Addendum)
Worse R>>L Gabapentin: increase to 300 mg po tid

## 2018-07-31 NOTE — Progress Notes (Signed)
Subjective:  Patient ID: Karina Newman, female    DOB: [birth year]  Age: 62 y.o. MRN: [id number]  CC: No chief complaint on file.   HPI Karina Newman presents for shoulder/neck pain - worse C/o muscle cramps F/u HTN  Outpatient Medications Prior to Visit  Medication Sig Dispense Refill  . amLODipine (NORVASC) 5 MG tablet Take 2 tablets (10 mg total) by mouth daily. Take 10mg  until stress test completed. 60 tablet 5  . carvedilol (COREG) 12.5 MG tablet Take 1 tablet (12.5 mg total) by mouth 2 (two) times daily. 180 tablet 3  . Cyanocobalamin (VITAMIN B-12) 500 MCG SUBL 1 sl qd 100 tablet 5  . cyclobenzaprine (FLEXERIL) 5 MG tablet TAKE 1 TABLET BY MOUTH THREE TIMES A DAY AS NEEDED FOR MUSCLE SPASMS 60 tablet 3  . Diclofenac Sodium 1.5 % SOLN PLACE 0.3MLS ONTO THE SKIN 4 TIMES DAILY AS NEEDED 150 mL 2  . dicyclomine (BENTYL) 10 MG capsule TAKE 1 CAPSULE (10 MG TOTAL) BY MOUTH EVERY 8 (EIGHT) HOURS AS NEEDED FOR SPASMS. 60 capsule 2  . doxazosin (CARDURA) 4 MG tablet Take 1 tablet (4 mg total) by mouth daily. 90 tablet 1  . fluticasone (FLONASE) 50 MCG/ACT nasal spray Place 1 spray into both nostrils as needed. 16 g 11  . gabapentin (NEURONTIN) 100 MG capsule Take 1 capsule (100 mg total) by mouth 3 (three) times daily as needed. 90 capsule 3  . omeprazole (PRILOSEC) 40 MG capsule TAKE 1 CAPSULE BY MOUTH EVERY DAY 90 capsule 3  . potassium chloride (KLOR-CON 10) 10 MEQ tablet Take 1 tablet (10 mEq total) by mouth daily. 30 tablet 5  . sucralfate (CARAFATE) 1 g tablet TAKE 1 TABLET BY MOUTH 3 TIMES A DAY BETWEEN MEALS 90 tablet 11  . telmisartan-hydrochlorothiazide (MICARDIS HCT) 80-25 MG tablet Take 1 tablet by mouth daily. 90 tablet 3  . valACYclovir (VALTREX) 500 MG tablet Take 1 tablet (500 mg total) by mouth 2 (two) times daily. For 5 days with outbreak 30 tablet 1  . Vitamin D, Cholecalciferol, 1000 units CAPS Take 2,000 Units by mouth.    . zolpidem (AMBIEN) 10 MG tablet TAKE 1  TABLET BY MOUTH PRIOR TO STUDY 1 tablet 0   Facility-Administered Medications Prior to Visit  Medication Dose Route Frequency Provider Last Rate Last Dose  . 0.9 %  sodium chloride infusion  500 mL Intravenous Once Armbruster, Carlota Raspberry, MD        ROS: Review of Systems  Constitutional: Positive for fatigue. Negative for activity change, appetite change, chills and unexpected weight change.  HENT: Negative for congestion, mouth sores and sinus pressure.   Eyes: Negative for visual disturbance.  Respiratory: Negative for cough and chest tightness.   Gastrointestinal: Negative for abdominal pain and nausea.  Genitourinary: Negative for difficulty urinating, frequency and vaginal pain.  Musculoskeletal: Positive for arthralgias, back pain, myalgias, neck pain and neck stiffness. Negative for gait problem.  Skin: Negative for pallor and rash.  Neurological: Negative for dizziness, tremors, weakness, numbness and headaches.  Psychiatric/Behavioral: Negative for confusion and sleep disturbance.    Objective:  BP 126/80 (BP Location: Left Arm, Patient Position: Sitting, Cuff Size: Normal)   Pulse 83   Temp 98.2 F (36.8 C) (Oral)   Ht 5\' 4"  (1.626 m)   Wt 182 lb (82.6 kg)   SpO2 98%   BMI 31.24 kg/m   BP Readings from Last 3 Encounters:  07/31/18 126/80  06/29/18 (!) 148/82  06/26/18 124/76  Wt Readings from Last 3 Encounters:  07/31/18 182 lb (82.6 kg)  06/29/18 177 lb (80.3 kg)  05/30/18 175 lb (79.4 kg)    Physical Exam Constitutional:      General: She is not in acute distress.    Appearance: She is well-developed.  HENT:     Head: Normocephalic.     Right Ear: External ear normal.     Left Ear: External ear normal.     Nose: Nose normal.  Eyes:     General:        Right eye: No discharge.        Left eye: No discharge.     Conjunctiva/sclera: Conjunctivae normal.     Pupils: Pupils are equal, round, and reactive to light.  Neck:     Musculoskeletal: Normal  range of motion and neck supple.     Thyroid: No thyromegaly.     Vascular: No JVD.     Trachea: No tracheal deviation.  Cardiovascular:     Rate and Rhythm: Normal rate and regular rhythm.     Heart sounds: Normal heart sounds.  Pulmonary:     Effort: No respiratory distress.     Breath sounds: No stridor. No wheezing.  Abdominal:     General: Bowel sounds are normal. There is no distension.     Palpations: Abdomen is soft. There is no mass.     Tenderness: There is no abdominal tenderness. There is no guarding or rebound.  Musculoskeletal:        General: Tenderness present.  Lymphadenopathy:     Cervical: No cervical adenopathy.  Skin:    Findings: No erythema or rash.  Neurological:     Cranial Nerves: No cranial nerve deficit.     Motor: No abnormal muscle tone.     Coordination: Coordination normal.     Deep Tendon Reflexes: Reflexes normal.  Psychiatric:        Behavior: Behavior normal.        Thought Content: Thought content normal.        Judgment: Judgment normal.   neck - tender R shoulder/R trap - painful  Lab Results  Component Value Date   WBC 4.9 04/18/2018   HGB 13.3 04/18/2018   HCT 39.0 04/18/2018   PLT 336.0 04/18/2018   GLUCOSE 78 06/20/2018   CHOL 183 04/18/2018   TRIG 73.0 04/18/2018   HDL 36.20 (L) 04/18/2018   LDLCALC 132 (H) 04/18/2018   ALT 24 04/18/2018   AST 18 04/18/2018   NA 140 06/20/2018   K 4.3 06/20/2018   CL 97 06/20/2018   CREATININE 0.92 06/20/2018   BUN 11 06/20/2018   CO2 25 06/20/2018   TSH 0.71 04/18/2018    Ct Coronary Morph W/cta Cor W/score W/ca W/cm &/or Wo/cm  Addendum Date: 06/26/2018   ADDENDUM REPORT: 06/26/2018 21:49 CLINICAL DATA:  Chest pain EXAM: Cardiac CTA MEDICATIONS: Sub lingual nitro. 4mg  x 2 : The patient was scanned on a Siemens 562 slice scanner. Gantry rotation speed was 250 msecs. Collimation was 0.6 mm. A 100 kV prospective scan was triggered in the ascending thoracic aorta at 35-75% of the R-R  interval. Average HR during the scan was 60 bpm. The 3D data set was interpreted on a dedicated work station using MPR, MIP and VRT modes. A total of 80cc of contrast was used. FINDINGS: Non-cardiac: See separate report from [facility]. Pulmonary veins drain normally to the left atrium. Calcium Score: 0 Agatston units. Coronary Arteries:  Left dominant with no anomalies LM: Very short left main, essentially separate ostia for LAD and LCx. LAD system: No plaque or stenosis. Circumflex system: Large, dominant vessel with no plaque or stenosis. RCA system: Small, nondominant vessel.  No plaque or stenosis. IMPRESSION: 1. Coronary artery calcium score 0 Agatston units. This suggests low risk for future cardiac events. 2.  No significant coronary disease noted. Dalton Mclean Electronically Signed   By: Loralie Champagne M.D.   On: 06/26/2018 21:49   Result Date: 06/26/2018 EXAM: OVER-READ INTERPRETATION  CT CHEST The following report is an over-read performed by radiologist Dr. Rolm Baptise of [facility] on 06/26/2018. This over-read does not include interpretation of cardiac or coronary anatomy or pathology. The coronary CTA interpretation by the cardiologist is attached. COMPARISON:  None. FINDINGS: Vascular: Mild cardiomegaly.  Aorta is normal caliber. Mediastinum/Nodes: No adenopathy in the lower mediastinum or hila. Lungs/Pleura: Linear scarring in the lung bases.  No effusions. Upper Abdomen: Imaging into the upper abdomen shows no acute findings. Musculoskeletal: Chest wall soft tissues are unremarkable. No acute bony abnormality. IMPRESSION: Mild cardiomegaly.  Bibasilar scarring.  No active disease. Electronically Signed: By: Rolm Baptise M.D. On: 06/26/2018 12:26    Assessment & Plan:   There are no diagnoses linked to this encounter.   No orders of the defined types were placed in this encounter.    Follow-up: No follow-ups on file.  Walker Kehr, MD

## 2018-07-31 NOTE — Assessment & Plan Note (Signed)
On B12 

## 2018-07-31 NOTE — Assessment & Plan Note (Signed)
Protonix po 

## 2018-07-31 NOTE — Assessment & Plan Note (Signed)
Amlodipine Micardis HCT 

## 2018-08-28 ENCOUNTER — Other Ambulatory Visit: Payer: Self-pay | Admitting: Internal Medicine

## 2018-09-07 ENCOUNTER — Ambulatory Visit: Payer: Federal, State, Local not specified - PPO | Admitting: Cardiovascular Disease

## 2018-09-07 ENCOUNTER — Encounter: Payer: Self-pay | Admitting: Cardiovascular Disease

## 2018-09-07 VITALS — BP 118/74 | HR 85 | Ht 64.0 in | Wt 180.4 lb

## 2018-09-07 DIAGNOSIS — R0789 Other chest pain: Secondary | ICD-10-CM | POA: Diagnosis not present

## 2018-09-07 DIAGNOSIS — R000 Tachycardia, unspecified: Secondary | ICD-10-CM

## 2018-09-07 DIAGNOSIS — I10 Essential (primary) hypertension: Secondary | ICD-10-CM

## 2018-09-07 NOTE — Patient Instructions (Signed)
Medication Instructions:  Your physician recommends that you continue on your current medications as directed. Please refer to the Current Medication list given to you today.  If you need a refill on your cardiac medications before your next appointment, please call your pharmacy.   Lab work: NONE  Testing/Procedures: NONE  Follow-Up: AS NEEDED   

## 2018-09-07 NOTE — Progress Notes (Signed)
Cardiology Office Note   Date:  09/07/2018   ID:  Karina Newman, DOB September 11, 1955, MRN [id number]  PCP:  Cassandria Anger, MD  Cardiologist:   Skeet Latch, MD   No chief complaint on file.    History of Present Illness: Karina Newman is a 63 y.o. female with hypertension, autoimmune gastritis, and hyperthyroidism here for follow up.  She was initially seen for the evaluation of chest pain and abnormal EKG.  Karina Newman saw Dr. Alain Marion 04/2018 and reported L sided chest pain.  EKG at that time revealed sinus rhythm with LVH and nonspecific T wave abnormality's.  Karina Newman reported intermittent L sided chest pain that felt like spasm.  She was referred for an exercise Myoview 05/2018 that revealed LVEF 54% with 1 mm ST depression with exertion.  Perfusion imaging showed a small, mild defect in the basal to mid anteroseptal and apical septal regions.  It was unclear whether this represented breast attenuation artifact or ischemia.  TID was 1.2.  She was referred for coronary CT-A 06/2018 that showed no CAD.  Coronary calcium score was 0.    At her last appointment Karina Newman's blood pressure remained elevated.  Doxazosin was added to her regimen.  Since that time she has been feeling well.  She has started walking more.  She visited family in [place] for a month.  While there she walked more and has continued to do so since she got home.  She has no exertional chest pain or shortness of breath.  She has occasional lower extremity edema to her left ankle but no orthopnea or PND.  She complains of some paresthesias on the left side of her back.  Her PCP recently increased gabapentin.  She is unsure if this has been helpful.  She does not regularly check her blood pressure at home.   Past Medical History:  Diagnosis Date  . Abnormal cervical Pap smear with positive HPV DNA test 01/2014,01/2017   2015 Normal cytology with positive high-risk HPV 18/45. Colposcopy negative with  negative ECC.  2018 normal cytology with positive high-risk HPV 18/45  . Anemia   . Apnea 05/17/2018  . Autoimmune gastritis   . Fatty liver   . Gastritis   . Heart murmur   . Hypertension   . Hyperthyroidism   . Inappropriate sinus tachycardia 05/17/2018  . Internal hemorrhoids   . Right shoulder pain 2001   chronic pain  . Thyroid disease    Hyperthyroid. Was on PTU (Dr Hampton Abbot in [place]) - stopped in 2013, stable  . Tubular adenoma of colon     Past Surgical History:  Procedure Laterality Date  . BREAST CYST ASPIRATION Left 2012  . CESAREAN SECTION     x 4   . HAND SURGERY Left   . SHOULDER SURGERY Right 2002  . SHOULDER SURGERY Left 2014  . TUBAL LIGATION       Current Outpatient Medications  Medication Sig Dispense Refill  . amLODipine (NORVASC) 5 MG tablet Take 2 tablets (10 mg total) by mouth daily. Take 10mg  until stress test completed. 60 tablet 5  . Cyanocobalamin (VITAMIN B-12) 500 MCG SUBL 1 sl qd 100 tablet 5  . cyclobenzaprine (FLEXERIL) 5 MG tablet TAKE 1-2 TABLET BY MOUTH TWOTIMES A DAY AS NEEDED FOR MUSCLE SPASMS 60 tablet 5  . Diclofenac Sodium 1.5 % SOLN PLACE 0.3MLS ONTO THE SKIN 4 TIMES DAILY AS NEEDED 150 mL 3  . dicyclomine (BENTYL) 10 MG  capsule TAKE 1 CAPSULE (10 MG TOTAL) BY MOUTH EVERY 8 (EIGHT) HOURS AS NEEDED FOR SPASMS. 60 capsule 2  . doxazosin (CARDURA) 4 MG tablet Take 1 tablet (4 mg total) by mouth daily. 90 tablet 1  . fluticasone (FLONASE) 50 MCG/ACT nasal spray Place 1 spray into both nostrils as needed. 16 g 11  . gabapentin (NEURONTIN) 300 MG capsule Take 1 capsule (300 mg total) by mouth 3 (three) times daily. 90 capsule 5  . omeprazole (PRILOSEC) 40 MG capsule TAKE 1 CAPSULE BY MOUTH EVERY DAY 90 capsule 3  . potassium chloride (KLOR-CON 10) 10 MEQ tablet Take 1 tablet (10 mEq total) by mouth daily. 30 tablet 5  . sucralfate (CARAFATE) 1 g tablet TAKE 1 TABLET BY MOUTH 3 TIMES A DAY BETWEEN MEALS 90 tablet 11  .  telmisartan-hydrochlorothiazide (MICARDIS HCT) 80-25 MG tablet Take 1 tablet by mouth daily. 90 tablet 3  . valACYclovir (VALTREX) 500 MG tablet Take 1 tablet (500 mg total) by mouth 2 (two) times daily. For 5 days with outbreak 30 tablet 1  . Vitamin D, Cholecalciferol, 1000 units CAPS Take 2,000 Units by mouth.    . zolpidem (AMBIEN) 10 MG tablet TAKE 1 TABLET BY MOUTH PRIOR TO STUDY 1 tablet 0  . carvedilol (COREG) 12.5 MG tablet Take 1 tablet (12.5 mg total) by mouth 2 (two) times daily. 180 tablet 3   Current Facility-Administered Medications  Medication Dose Route Frequency Provider Last Rate Last Dose  . 0.9 %  sodium chloride infusion  500 mL Intravenous Once Armbruster, Carlota Raspberry, MD        Allergies:   Penicillins; Gabapentin; Iodine; Lyrica [pregabalin]; Nsaids; Tramadol; and Voltaren [diclofenac]    Social History:  The patient  reports that she has never smoked. She has never used smokeless tobacco. She reports that she does not drink alcohol or use drugs.   Family History:  The patient's family history includes Cancer in her father; Diabetes in her brother and paternal grandmother; Hypertension in her brother, mother, sister, and sister; Kidney disease in her mother.    ROS:  Please see the history of present illness.   Otherwise, review of systems are positive for none.   All other systems are reviewed and negative.    PHYSICAL EXAM: VS:  BP 118/74   Pulse 85   Ht 5\' 4"  (1.626 m)   Wt 180 lb 6.4 oz (81.8 kg)   BMI 30.97 kg/m  , BMI Body mass index is 30.97 kg/m. GENERAL:  Well appearing HEENT: Pupils equal round and reactive, fundi not visualized, oral mucosa unremarkable NECK:  No jugular venous distention, waveform within normal limits, carotid upstroke brisk and symmetric, no bruits, no thyromegaly LYMPHATICS:  No cervical adenopathy LUNGS:  Clear to auscultation bilaterally HEART:  RRR.  PMI not displaced or sustained,S1 and S2 within normal limits, no S3, no S4,  no clicks, no rubs, no follow-up murmurs ABD:  Flat, positive bowel sounds normal in frequency in pitch, no bruits, no rebound, no guarding, no midline pulsatile mass, no hepatomegaly, no splenomegaly EXT:  2 plus pulses throughout, no edema, no cyanosis no clubbing SKIN:  No rashes no nodules NEURO:  Cranial nerves II through XII grossly intact, motor grossly intact throughout PSYCH:  Cognitively intact, oriented to person place and time   EKG:  EKG is not ordered today. The ekg ordered 04/18/18 demonstrates sinus rhythm.  Rate 81 bpm. Non-specific ST changes.   Echo 03/22/13: LVEF 55%.  Moderate  LVH.  Grade 1 diastolic dysfunction. Trace MR.  Moderate TR.   Exercise Myoview 05/30/18:  The left ventricular ejection fraction is mildly decreased (45-54%).  Nuclear stress EF: 54%.  Blood pressure demonstrated a normal response to exercise.  There was 28mm of horizontal ST depression in the inferolateral leads at peak exercise with T wave inversions in recovery  There is a small defect of mild severity present in the basal anteroseptal, mid anteroseptal and apical septal location. The defect is reversible. Cannot rule out small area of ischemia vs. Variations in breast attenuation artifact.  There is transient ischemic dilatation with TID of 1.2 which could indicate multivessel CAD. Consider coronary CTA for further evaulation.  This is an intermediate risk study due to increased TID.  Coronary CT-A 06/26/18: No CAD.  Coronary calcium score 0.  Recent Labs: 04/18/2018: ALT 24; Hemoglobin 13.3; Platelets 336.0; TSH 0.71 06/20/2018: BUN 11; Creatinine, Ser 0.92; Potassium 4.3; Sodium 140    Lipid Panel    Component Value Date/Time   CHOL 183 04/18/2018 1527   TRIG 73.0 04/18/2018 1527   HDL 36.20 (L) 04/18/2018 1527   CHOLHDL 5 04/18/2018 1527   VLDL 14.6 04/18/2018 1527   LDLCALC 132 (H) 04/18/2018 1527      Wt Readings from Last 3 Encounters:  09/07/18 180 lb 6.4 oz (81.8 kg)    07/31/18 182 lb (82.6 kg)  06/29/18 177 lb (80.3 kg)      ASSESSMENT AND PLAN:  # Chest pain:  Coronary CT-A was without CAD.  Her symptoms are not ischemic.   # Hypertension: Blood pressure is better controlled since adding doxazosin.  Continue carvedilol, doxazosin, amlodipine, HCTZ, and telmisartan.  # Apnea:   Sleep study pending  # Inappropriate sinus tachycardia:  Improved on carvedilol.  Current medicines are reviewed at length with the patient today.  The patient does not have concerns regarding medicines.  The following changes have been made: None  Labs/ tests ordered today include:   No orders of the defined types were placed in this encounter.    Disposition:   FU with Sayra Frisby C. Oval Linsey, MD, [facility] as needed.    Signed, Jonathon Castelo C. Oval Linsey, MD, [place]  09/07/2018 1:07 PM    [place]

## 2018-09-23 ENCOUNTER — Other Ambulatory Visit: Payer: Self-pay | Admitting: Internal Medicine

## 2018-09-28 ENCOUNTER — Encounter: Payer: Self-pay | Admitting: Internal Medicine

## 2018-09-28 ENCOUNTER — Ambulatory Visit: Payer: Federal, State, Local not specified - PPO | Admitting: Internal Medicine

## 2018-09-28 VITALS — BP 130/76 | HR 74 | Temp 98.3°F | Ht 64.0 in | Wt 180.0 lb

## 2018-09-28 DIAGNOSIS — M25561 Pain in right knee: Secondary | ICD-10-CM

## 2018-09-28 DIAGNOSIS — M79643 Pain in unspecified hand: Secondary | ICD-10-CM | POA: Insufficient documentation

## 2018-09-28 DIAGNOSIS — M79641 Pain in right hand: Secondary | ICD-10-CM | POA: Diagnosis not present

## 2018-09-28 DIAGNOSIS — M79642 Pain in left hand: Secondary | ICD-10-CM

## 2018-09-28 DIAGNOSIS — M25562 Pain in left knee: Secondary | ICD-10-CM

## 2018-09-28 DIAGNOSIS — G8929 Other chronic pain: Secondary | ICD-10-CM

## 2018-09-28 DIAGNOSIS — M25569 Pain in unspecified knee: Secondary | ICD-10-CM | POA: Insufficient documentation

## 2018-09-28 MED ORDER — METHYLPREDNISOLONE 4 MG PO TBPK: ORAL_TABLET | ORAL | 0 refills | Status: DC

## 2018-09-28 NOTE — Progress Notes (Signed)
Subjective:  Patient ID: Karina Newman, female    DOB: 1955-11-03  Age: 63 y.o. MRN: [id number]  CC: No chief complaint on file.   HPI Karina Newman presents for pain in B knees, hands R>L F/u R shoulder pain/neck pain - chronic  Outpatient Medications Prior to Visit  Medication Sig Dispense Refill  . amLODipine (NORVASC) 5 MG tablet Take 2 tablets (10 mg total) by mouth daily. Take 10mg  until stress test completed. 60 tablet 5  . Cyanocobalamin (VITAMIN B-12) 500 MCG SUBL 1 sl qd 100 tablet 5  . cyclobenzaprine (FLEXERIL) 5 MG tablet TAKE 1-2 TABLET BY MOUTH TWOTIMES A DAY AS NEEDED FOR MUSCLE SPASMS 60 tablet 5  . Diclofenac Sodium 1.5 % SOLN PLACE 0.3MLS ONTO THE SKIN 4 TIMES DAILY AS NEEDED 150 mL 3  . dicyclomine (BENTYL) 10 MG capsule TAKE 1 CAPSULE (10 MG TOTAL) BY MOUTH EVERY 8 (EIGHT) HOURS AS NEEDED FOR SPASMS. 60 capsule 2  . doxazosin (CARDURA) 4 MG tablet Take 1 tablet (4 mg total) by mouth daily. 90 tablet 1  . fluticasone (FLONASE) 50 MCG/ACT nasal spray Place 1 spray into both nostrils as needed. 16 g 11  . gabapentin (NEURONTIN) 100 MG capsule TAKE 1 CAPSULE BY MOUTH THREE TIMES A DAY AS NEEDED 90 capsule 3  . gabapentin (NEURONTIN) 300 MG capsule Take 1 capsule (300 mg total) by mouth 3 (three) times daily. 90 capsule 5  . omeprazole (PRILOSEC) 40 MG capsule TAKE 1 CAPSULE BY MOUTH EVERY DAY 90 capsule 3  . potassium chloride (KLOR-CON 10) 10 MEQ tablet Take 1 tablet (10 mEq total) by mouth daily. 30 tablet 5  . sucralfate (CARAFATE) 1 g tablet TAKE 1 TABLET BY MOUTH 3 TIMES A DAY BETWEEN MEALS 90 tablet 11  . telmisartan-hydrochlorothiazide (MICARDIS HCT) 80-25 MG tablet Take 1 tablet by mouth daily. 90 tablet 3  . valACYclovir (VALTREX) 500 MG tablet Take 1 tablet (500 mg total) by mouth 2 (two) times daily. For 5 days with outbreak 30 tablet 1  . Vitamin D, Cholecalciferol, 1000 units CAPS Take 2,000 Units by mouth.    . zolpidem (AMBIEN) 10 MG tablet TAKE 1 TABLET  BY MOUTH PRIOR TO STUDY 1 tablet 0  . carvedilol (COREG) 12.5 MG tablet Take 1 tablet (12.5 mg total) by mouth 2 (two) times daily. 180 tablet 3   Facility-Administered Medications Prior to Visit  Medication Dose Route Frequency Provider Last Rate Last Dose  . 0.9 %  sodium chloride infusion  500 mL Intravenous Once Armbruster, Carlota Raspberry, MD        ROS: Review of Systems  Constitutional: Positive for fatigue.  Musculoskeletal: Positive for arthralgias, back pain, gait problem, joint swelling, myalgias, neck pain and neck stiffness.  Neurological: Positive for weakness.    Objective:  BP 130/76 (BP Location: Left Arm, Patient Position: Sitting, Cuff Size: Large)   Pulse 74   Temp 98.3 F (36.8 C) (Oral)   Ht 5\' 4"  (1.626 m)   Wt 180 lb (81.6 kg)   SpO2 98%   BMI 30.90 kg/m   BP Readings from Last 3 Encounters:  09/28/18 130/76  09/07/18 118/74  07/31/18 126/80    Wt Readings from Last 3 Encounters:  09/28/18 180 lb (81.6 kg)  09/07/18 180 lb 6.4 oz (81.8 kg)  07/31/18 182 lb (82.6 kg)    Physical Exam Constitutional:      General: She is not in acute distress.    Appearance: She is well-developed.  HENT:     Head: Normocephalic.     Right Ear: External ear normal.     Left Ear: External ear normal.     Nose: Nose normal.  Eyes:     General:        Right eye: No discharge.        Left eye: No discharge.     Conjunctiva/sclera: Conjunctivae normal.     Pupils: Pupils are equal, round, and reactive to light.  Neck:     Musculoskeletal: Normal range of motion and neck supple.     Thyroid: No thyromegaly.     Vascular: No JVD.     Trachea: No tracheal deviation.  Cardiovascular:     Rate and Rhythm: Normal rate and regular rhythm.     Heart sounds: Normal heart sounds.  Pulmonary:     Effort: No respiratory distress.     Breath sounds: No stridor. No wheezing.  Abdominal:     General: Bowel sounds are normal. There is no distension.     Palpations: Abdomen  is soft. There is no mass.     Tenderness: There is no abdominal tenderness. There is no guarding or rebound.  Musculoskeletal:        General: Tenderness present.  Lymphadenopathy:     Cervical: No cervical adenopathy.  Skin:    Findings: No erythema or rash.  Neurological:     Cranial Nerves: No cranial nerve deficit.     Motor: No abnormal muscle tone.     Coordination: Coordination normal.     Deep Tendon Reflexes: Reflexes normal.  Psychiatric:        Behavior: Behavior normal.        Thought Content: Thought content normal.        Judgment: Judgment normal.     CTS signs (-/+) B B hands NT B knees - tender w/ROM Neck - pain R>L  Lab Results  Component Value Date   WBC 4.9 04/18/2018   HGB 13.3 04/18/2018   HCT 39.0 04/18/2018   PLT 336.0 04/18/2018   GLUCOSE 78 06/20/2018   CHOL 183 04/18/2018   TRIG 73.0 04/18/2018   HDL 36.20 (L) 04/18/2018   LDLCALC 132 (H) 04/18/2018   ALT 24 04/18/2018   AST 18 04/18/2018   NA 140 06/20/2018   K 4.3 06/20/2018   CL 97 06/20/2018   CREATININE 0.92 06/20/2018   BUN 11 06/20/2018   CO2 25 06/20/2018   TSH 0.71 04/18/2018    Ct Coronary Morph W/cta Cor W/score W/ca W/cm &/or Wo/cm  Addendum Date: 06/26/2018   ADDENDUM REPORT: 06/26/2018 21:49 CLINICAL DATA:  Chest pain EXAM: Cardiac CTA MEDICATIONS: Sub lingual nitro. 4mg  x 2 : The patient was scanned on a Siemens 836 slice scanner. Gantry rotation speed was 250 msecs. Collimation was 0.6 mm. A 100 kV prospective scan was triggered in the ascending thoracic aorta at 35-75% of the R-R interval. Average HR during the scan was 60 bpm. The 3D data set was interpreted on a dedicated work station using MPR, MIP and VRT modes. A total of 80cc of contrast was used. FINDINGS: Non-cardiac: See separate report from [facility]. Pulmonary veins drain normally to the left atrium. Calcium Score: 0 Agatston units. Coronary Arteries: Left dominant with no anomalies LM: Very short  left main, essentially separate ostia for LAD and LCx. LAD system: No plaque or stenosis. Circumflex system: Large, dominant vessel with no plaque or stenosis. RCA system: Small, nondominant vessel.  No  plaque or stenosis. IMPRESSION: 1. Coronary artery calcium score 0 Agatston units. This suggests low risk for future cardiac events. 2.  No significant coronary disease noted. Dalton Mclean Electronically Signed   By: Loralie Champagne M.D.   On: 06/26/2018 21:49   Result Date: 06/26/2018 EXAM: OVER-READ INTERPRETATION  CT CHEST The following report is an over-read performed by radiologist Dr. Rolm Baptise of [facility] on 06/26/2018. This over-read does not include interpretation of cardiac or coronary anatomy or pathology. The coronary CTA interpretation by the cardiologist is attached. COMPARISON:  None. FINDINGS: Vascular: Mild cardiomegaly.  Aorta is normal caliber. Mediastinum/Nodes: No adenopathy in the lower mediastinum or hila. Lungs/Pleura: Linear scarring in the lung bases.  No effusions. Upper Abdomen: Imaging into the upper abdomen shows no acute findings. Musculoskeletal: Chest wall soft tissues are unremarkable. No acute bony abnormality. IMPRESSION: Mild cardiomegaly.  Bibasilar scarring.  No active disease. Electronically Signed: By: Rolm Baptise M.D. On: 06/26/2018 12:26    Assessment & Plan:   There are no diagnoses linked to this encounter.   No orders of the defined types were placed in this encounter.    Follow-up: No follow-ups on file.  Walker Kehr, MD

## 2018-09-28 NOTE — Patient Instructions (Signed)
Get a wrist splint for the right wrist

## 2018-09-28 NOTE — Assessment & Plan Note (Addendum)
B hands ?etiology - ?CTS R>L Splint R wrist Medrol dosepack Rheum ref CBD oil cream

## 2018-09-28 NOTE — Assessment & Plan Note (Signed)
B OA Rheum ref

## 2018-10-20 ENCOUNTER — Other Ambulatory Visit: Payer: Self-pay | Admitting: Internal Medicine

## 2018-10-23 NOTE — Progress Notes (Signed)
Office Visit Note  Patient: Karina Newman             Date of Birth: 03/04/1956           MRN: [id number]             PCP: Cassandria Anger, MD Referring: Cassandria Anger, MD Visit Date: 10/24/2018 Occupation: Reired, postal service clerk  Subjective:  Pain in multiple joints..   History of Present Illness: Karina Newman is a 63 y.o. female seen in consultation per request of her PCP.  According to patient in 2001 she injured her right shoulder at work.  She gives possible history of rotator cuff tear for which she required surgery in 2002.  She states she never completely recovered from that and still have difficulty with her right shoulder.  She states her left shoulder has been hurting since 2005 which was not work-related.  She has been having increased pain in her left shoulder since 2012.  She states in 2012 she fell and injured her bilateral knee joints.  At the time she was evaluated by Dr. Alphonzo Severance who did x-rays.  She states she has had intermittent swelling in her left knee joint.  According to patient since December 2019 she has been increasing experiencing excruciating pain in her knee joints and having difficulty climbing the stairs.  She also has discomfort in her bilateral hands and describes numbness in her both hands.  She has history of injury to her cervical spine.  Patient states she was offered surgery in the past and she was not interested in getting surgery.  She continues to have C-spine discomfort.  She states she receives pain medications for her neck pain.  She also describes pain in her bilateral pelvic region.  Activities of Daily Living:  Patient reports morning stiffness for 2 minutes.   Patient Denies nocturnal pain.  Difficulty dressing/grooming: Reports Difficulty climbing stairs: Reports Difficulty getting out of chair: Denies Difficulty using hands for taps, buttons, cutlery, and/or writing: Reports  Review of Systems  Constitutional:  Negative for fatigue, night sweats, weight gain and weight loss.  HENT: Negative for mouth sores, trouble swallowing, trouble swallowing, mouth dryness and nose dryness.   Eyes: Positive for dryness. Negative for pain, redness and visual disturbance.  Respiratory: Positive for shortness of breath. Negative for cough and difficulty breathing.   Cardiovascular: Negative for chest pain, palpitations, hypertension, irregular heartbeat and swelling in legs/feet.  Gastrointestinal: Negative for blood in stool, constipation and diarrhea.  Endocrine: Negative for increased urination.  Genitourinary: Negative for vaginal dryness.  Musculoskeletal: Positive for arthralgias, joint pain, joint swelling, myalgias, morning stiffness and myalgias. Negative for muscle weakness and muscle tenderness.  Skin: Negative for color change, rash, hair loss, skin tightness, ulcers and sensitivity to sunlight.  Allergic/Immunologic: Negative for susceptible to infections.  Neurological: Negative for dizziness, memory loss, night sweats and weakness.  Hematological: Negative for swollen glands.  Psychiatric/Behavioral: Positive for sleep disturbance. Negative for depressed mood. The patient is not nervous/anxious.     PMFS History:  Patient Active Problem List   Diagnosis Date Noted  . Hand pain 09/28/2018  . Knee pain 09/28/2018  . Inappropriate sinus tachycardia 05/17/2018  . Apnea 05/17/2018  . Upper respiratory infection 09/23/2017  . Fatigue 09/12/2017  . Well adult exam 01/19/2017  . Abdominal pain 01/19/2017  . Headache 10/27/2016  . Acute pain of left shoulder 09/16/2016  . Left hip pain 09/16/2016  . Low vitamin B12 level 03/16/2016  .  Piriformis syndrome of right side 02/04/2016  . IT band syndrome 02/04/2016  . RUQ abdominal pain 02/04/2016  . Low back pain radiating to right lower extremity 09/19/2015  . Allergic rhinitis 09/19/2015  . Atypical chest pain 04/07/2015  . GERD (gastroesophageal  reflux disease) 04/07/2015  . Axillary adenitis 03/23/2015  . Migraine headache 03/23/2015  . Hives 01/29/2015  . Edema 01/29/2015  . Cervical disc disorder with radiculopathy of cervical region 12/11/2014  . Insomnia 07/31/2014  . Degenerative cervical disc 05/08/2014  . Pain in joint, shoulder region 03/05/2014  . Patellofemoral arthralgia of both knees 03/01/2014  . Bilateral shoulder pain 10/18/2013  . Sinusitis, bacterial 08/20/2013  . URI, acute 08/14/2013  . Hemoptysis 08/14/2013  . Otitis media of left ear 08/14/2013  . Labral tear of shoulder 07/03/2013  . Hyperthyroidism 07/03/2013  . HTN (hypertension), benign 07/03/2013  . LVH (left ventricular hypertrophy) due to hypertensive disease 07/03/2013  . Vitamin D deficiency 07/03/2013    Past Medical History:  Diagnosis Date  . Abnormal cervical Pap smear with positive HPV DNA test 01/2014,01/2017   2015 Normal cytology with positive high-risk HPV 18/45. Colposcopy negative with negative ECC.  2018 normal cytology with positive high-risk HPV 18/45  . Anemia   . Apnea 05/17/2018  . Autoimmune gastritis   . Fatty liver   . Gastritis   . Heart murmur   . Hypertension   . Hyperthyroidism   . Inappropriate sinus tachycardia 05/17/2018  . Internal hemorrhoids   . Right shoulder pain 2001   chronic pain  . Thyroid disease    Hyperthyroid. Was on PTU (Dr Hampton Abbot in [place]) - stopped in 2013, stable  . Tubular adenoma of colon     Family History  Problem Relation Age of Onset  . Kidney disease Mother        ESRD  . Hypertension Mother   . Cancer Father        lung ca  . Hypertension Sister   . Diabetes Brother   . Hypertension Brother   . Diabetes Paternal Grandmother   . Hypertension Sister   . Hypertension Brother   . Sleep apnea Son   . Hypertension Son   . Colon cancer Neg Hx    Past Surgical History:  Procedure Laterality Date  . BREAST CYST ASPIRATION Left 2012  . CESAREAN SECTION     x 4   . HAND SURGERY Left    . SHOULDER SURGERY Right 2002  . SHOULDER SURGERY Left 2014  . TUBAL LIGATION     Social History   Social History Narrative  . Not on file   Immunization History  Administered Date(s) Administered  . Zoster Recombinat (Shingrix) 03/25/2017, 06/27/2017     Objective: Vital Signs: BP (!) 141/92 (BP Location: Right Arm, Patient Position: Sitting, Cuff Size: Normal)   Pulse 90   Resp 14   Ht 5\' 4"  (1.626 m)   Wt 183 lb (83 kg)   BMI 31.41 kg/m    Physical Exam Vitals signs and nursing note reviewed.  Constitutional:      Appearance: She is well-developed.  HENT:     Head: Normocephalic and atraumatic.  Eyes:     Conjunctiva/sclera: Conjunctivae normal.  Neck:     Musculoskeletal: Normal range of motion.  Cardiovascular:     Rate and Rhythm: Normal rate and regular rhythm.     Heart sounds: Normal heart sounds.  Pulmonary:     Effort: Pulmonary effort is normal.  Breath sounds: Normal breath sounds.  Abdominal:     General: Bowel sounds are normal.     Palpations: Abdomen is soft.  Lymphadenopathy:     Cervical: No cervical adenopathy.  Skin:    General: Skin is warm and dry.     Capillary Refill: Capillary refill takes less than 2 seconds.  Neurological:     Mental Status: She is alert and oriented to person, place, and time.  Psychiatric:        Behavior: Behavior normal.      Musculoskeletal Exam: C-spine limited range of motion with discomfort.  She also had some discomfort in the lower lumbar region.  Right shoulder joint abduction was limited to 120 degrees.  Left shoulder joint was full range of motion with some discomfort.  Elbow joints wrist joint MCPs PIPs DIPs been good range of motion with no synovitis.  Tinel's and Phalen's were negative.  She had painful range of motion of bilateral hip joints and knee joints.  No warmth swelling or effusion was noted.  CDAI Exam: CDAI Score: Not documented Patient Global Assessment: Not documented; Provider  Global Assessment: Not documented Swollen: Not documented; Tender: Not documented Joint Exam   Not documented   There is currently no information documented on the homunculus. Go to the Rheumatology activity and complete the homunculus joint exam.  Investigation: No additional findings.  Imaging: Xr Hips Bilat W Or W/o Pelvis 3-4 Views  Result Date: 10/24/2018 No SI joint sclerosis was noted.  Mild superior lateral narrowing was noted bilaterally.  Spurring was noted.  No chondrocalcinosis was noted. Impression: These findings are consistent with mild osteoarthritis of the hip joint.  Xr Knee 3 View Left  Result Date: 10/24/2018 Moderate medial compartment narrowing was noted.  No chondrocalcinosis was noted.  Moderate patellofemoral narrowing was noted. Impression: These findings are consistent with moderate osteoarthritis and moderate chondromalacia patella.  Xr Knee 3 View Right  Result Date: 10/24/2018 Moderate medial compartment narrowing was noted.  No chondrocalcinosis was noted.  Moderate patellofemoral narrowing was noted. Impression: These findings are consistent with moderate osteoarthritis and moderate chondromalacia patella.  Xr Shoulder Left  Result Date: 10/24/2018 No glenohumeral joint space narrowing was noted.  Mild inferior spurring was noted.  No acromioclavicular joint space narrowing was noted.  No chondrocalcinosis was noted. Impression: Unremarkable x-ray of the shoulder joint.   Recent Labs: Lab Results  Component Value Date   WBC 4.9 04/18/2018   HGB 13.3 04/18/2018   PLT 336.0 04/18/2018   NA 140 06/20/2018   K 4.3 06/20/2018   CL 97 06/20/2018   CO2 25 06/20/2018   GLUCOSE 78 06/20/2018   BUN 11 06/20/2018   CREATININE 0.92 06/20/2018   BILITOT 0.6 04/18/2018   ALKPHOS 80 04/18/2018   AST 18 04/18/2018   ALT 24 04/18/2018   PROT 8.0 04/18/2018   ALBUMIN 4.3 04/18/2018   CALCIUM 9.5 06/20/2018   GFRAA 78 06/20/2018    Speciality Comments:  No specialty comments available.  Procedures:  No procedures performed Allergies: Penicillins; Gabapentin; Iodine; Lyrica [pregabalin]; Nsaids; Tramadol; and Voltaren [diclofenac]   Assessment / Plan:     Visit Diagnoses: Pain in both hands -patient reports numbness in her bilateral hands but not much discomfort.  She states she has had nerve conduction velocities in the past.  She does not recall the results.  Chronic pain of both shoulders-she has chronic pain in the right shoulder due to prior injury which required surgery.  She also  has discomfort in her left shoulder.  She complains of increased left shoulder pain recently.  The x-ray of the shoulder joint obtained today was within normal limits.  Have given handout on shoulder joint exercises.  Chronic pain of both hips-she had painful range of motion of bilateral hip joints.  X-ray obtained of bilateral hips today showed normal SI joints.  Mild osteoarthritic changes with some spurring was noted.  Weight loss will be helpful.  Chronic pain of both knees-she has had pain in her bilateral knee joints for several years.  The pain has been excruciating since December 2019.  She also gives history of intermittent swelling.  No warmth swelling or effusion was noted today.  X-ray of bilateral knee joints 3 views were obtained today.  Which showed moderate osteoarthritis and moderate chondromalacia patella.  Weight loss diet and exercise was discussed.  A handout on knee exercises was given.  I will obtain some labs as listed below.  Patellofemoral arthralgia of both knees  Trochanteric bursitis of left hip-she had tenderness on palpation.  IT band exercises handout was given.  Cervical disc disorder with radiculopathy of cervical region-she has chronic pain and discomfort.  I also reviewed MRI of her cervical spine which was consistent with disc disease of cervical spine.  Other medical problems are listed as follows:  Hyperthyroidism  HTN  (hypertension), benign  Hypertensive left ventricular hypertrophy, without heart failure  Tubular adenoma of colon  History of gastroesophageal reflux (GERD)  Vitamin D deficiency  Fatty liver  Other insomnia   Orders: Orders Placed This Encounter  Procedures  . XR HIPS BILAT W OR W/O PELVIS 3-4 VIEWS  . XR KNEE 3 VIEW RIGHT  . XR KNEE 3 VIEW LEFT  . XR Shoulder Left  . Rheumatoid factor  . Cyclic citrul peptide antibody, IgG  . Angiotensin converting enzyme  . Uric acid   No orders of the defined types were placed in this encounter.   Face-to-face time spent with patient was 50 minutes. Greater than 50% of time was spent in counseling and coordination of care.  Follow-Up Instructions: Return for Osteoarthritis.   Bo Merino, MD  Note - This record has been created using Editor, commissioning.  Chart creation errors have been sought, but may not always  have been located. Such creation errors do not reflect on  the standard of medical care.

## 2018-10-24 ENCOUNTER — Ambulatory Visit: Payer: Federal, State, Local not specified - PPO | Admitting: Rheumatology

## 2018-10-24 ENCOUNTER — Encounter: Payer: Self-pay | Admitting: Rheumatology

## 2018-10-24 ENCOUNTER — Ambulatory Visit (INDEPENDENT_AMBULATORY_CARE_PROVIDER_SITE_OTHER): Payer: Self-pay

## 2018-10-24 ENCOUNTER — Ambulatory Visit (INDEPENDENT_AMBULATORY_CARE_PROVIDER_SITE_OTHER): Payer: Federal, State, Local not specified - PPO

## 2018-10-24 VITALS — BP 141/92 | HR 90 | Resp 14 | Ht 64.0 in | Wt 183.0 lb

## 2018-10-24 DIAGNOSIS — E559 Vitamin D deficiency, unspecified: Secondary | ICD-10-CM

## 2018-10-24 DIAGNOSIS — D126 Benign neoplasm of colon, unspecified: Secondary | ICD-10-CM

## 2018-10-24 DIAGNOSIS — M25551 Pain in right hip: Secondary | ICD-10-CM | POA: Diagnosis not present

## 2018-10-24 DIAGNOSIS — M25512 Pain in left shoulder: Secondary | ICD-10-CM | POA: Diagnosis not present

## 2018-10-24 DIAGNOSIS — M222X2 Patellofemoral disorders, left knee: Secondary | ICD-10-CM | POA: Diagnosis not present

## 2018-10-24 DIAGNOSIS — G4709 Other insomnia: Secondary | ICD-10-CM

## 2018-10-24 DIAGNOSIS — M79642 Pain in left hand: Secondary | ICD-10-CM

## 2018-10-24 DIAGNOSIS — G8929 Other chronic pain: Secondary | ICD-10-CM

## 2018-10-24 DIAGNOSIS — M25562 Pain in left knee: Secondary | ICD-10-CM | POA: Diagnosis not present

## 2018-10-24 DIAGNOSIS — M7062 Trochanteric bursitis, left hip: Secondary | ICD-10-CM

## 2018-10-24 DIAGNOSIS — M25552 Pain in left hip: Secondary | ICD-10-CM | POA: Diagnosis not present

## 2018-10-24 DIAGNOSIS — M5010 Cervical disc disorder with radiculopathy, unspecified cervical region: Secondary | ICD-10-CM

## 2018-10-24 DIAGNOSIS — M79641 Pain in right hand: Secondary | ICD-10-CM

## 2018-10-24 DIAGNOSIS — I119 Hypertensive heart disease without heart failure: Secondary | ICD-10-CM

## 2018-10-24 DIAGNOSIS — K760 Fatty (change of) liver, not elsewhere classified: Secondary | ICD-10-CM

## 2018-10-24 DIAGNOSIS — M222X1 Patellofemoral disorders, right knee: Secondary | ICD-10-CM | POA: Diagnosis not present

## 2018-10-24 DIAGNOSIS — M25561 Pain in right knee: Secondary | ICD-10-CM | POA: Diagnosis not present

## 2018-10-24 DIAGNOSIS — E0590 Thyrotoxicosis, unspecified without thyrotoxic crisis or storm: Secondary | ICD-10-CM

## 2018-10-24 DIAGNOSIS — I10 Essential (primary) hypertension: Secondary | ICD-10-CM

## 2018-10-24 DIAGNOSIS — Z8719 Personal history of other diseases of the digestive system: Secondary | ICD-10-CM

## 2018-10-24 DIAGNOSIS — M25511 Pain in right shoulder: Secondary | ICD-10-CM

## 2018-10-24 NOTE — Patient Instructions (Signed)
Knee Exercises Ask your health care provider which exercises are safe for you. Do exercises exactly as told by your health care provider and adjust them as directed. It is normal to feel mild stretching, pulling, tightness, or discomfort as you do these exercises, but you should stop right away if you feel sudden pain or your pain gets worse.Do not begin these exercises until told by your health care provider. STRETCHING AND RANGE OF MOTION EXERCISES These exercises warm up your muscles and joints and improve the movement and flexibility of your knee. These exercises also help to relieve pain, numbness, and tingling. Exercise A: Knee Extension, Prone 1. Lie on your abdomen on a bed. 2. Place your left / right knee just beyond the edge of the surface so your knee is not on the bed. You can put a towel under your left / right thigh just above your knee for comfort. 3. Relax your leg muscles and allow gravity to straighten your knee. You should feel a stretch behind your left / right knee. 4. Hold this position for __________ seconds. 5. Scoot up so your knee is supported between repetitions. Repeat __________ times. Complete this stretch __________ times a day. Exercise B: Knee Flexion, Active  1. Lie on your back with both knees straight. If this causes back discomfort, bend your left / right knee so your foot is flat on the floor. 2. Slowly slide your left / right heel back toward your buttocks until you feel a gentle stretch in the front of your knee or thigh. 3. Hold this position for __________ seconds. 4. Slowly slide your left / right heel back to the starting position. Repeat __________ times. Complete this exercise __________ times a day. Exercise C: Quadriceps, Prone  1. Lie on your abdomen on a firm surface, such as a bed or padded floor. 2. Bend your left / right knee and hold your ankle. If you cannot reach your ankle or pant leg, loop a belt around your foot and grab the belt  instead. 3. Gently pull your heel toward your buttocks. Your knee should not slide out to the side. You should feel a stretch in the front of your thigh and knee. 4. Hold this position for __________ seconds. Repeat __________ times. Complete this stretch __________ times a day. Exercise D: Hamstring, Supine 1. Lie on your back. 2. Loop a belt or towel over the ball of your left / right foot. The ball of your foot is on the walking surface, right under your toes. 3. Straighten your left / right knee and slowly pull on the belt to raise your leg until you feel a gentle stretch behind your knee. ? Do not let your left / right knee bend while you do this. ? Keep your other leg flat on the floor. 4. Hold this position for __________ seconds. Repeat __________ times. Complete this stretch __________ times a day. STRENGTHENING EXERCISES These exercises build strength and endurance in your knee. Endurance is the ability to use your muscles for a long time, even after they get tired. Exercise E: Quadriceps, Isometric  1. Lie on your back with your left / right leg extended and your other knee bent. Put a rolled towel or small pillow under your knee if told by your health care provider. 2. Slowly tense the muscles in the front of your left / right thigh. You should see your kneecap slide up toward your hip or see increased dimpling just above the knee. This   motion will push the back of the knee toward the floor. 3. For __________ seconds, keep the muscle as tight as you can without increasing your pain. 4. Relax the muscles slowly and completely. Repeat __________ times. Complete this exercise __________ times a day. Exercise F: Straight Leg Raises - Quadriceps 1. Lie on your back with your left / right leg extended and your other knee bent. 2. Tense the muscles in the front of your left / right thigh. You should see your kneecap slide up or see increased dimpling just above the knee. Your thigh may  even shake a bit. 3. Keep these muscles tight as you raise your leg 4-6 inches (10-15 cm) off the floor. Do not let your knee bend. 4. Hold this position for __________ seconds. 5. Keep these muscles tense as you lower your leg. 6. Relax your muscles slowly and completely after each repetition. Repeat __________ times. Complete this exercise __________ times a day. Exercise G: Hamstring, Isometric 1. Lie on your back on a firm surface. 2. Bend your left / right knee approximately __________ degrees. 3. Dig your left / right heel into the surface as if you are trying to pull it toward your buttocks. Tighten the muscles in the back of your thighs to dig as hard as you can without increasing any pain. 4. Hold this position for __________ seconds. 5. Release the tension gradually and allow your muscles to relax completely for __________ seconds after each repetition. Repeat __________ times. Complete this exercise __________ times a day. Exercise H: Hamstring Curls  If told by your health care provider, do this exercise while wearing ankle weights. Begin with __________ weights. Then increase the weight by 1 lb (0.5 kg) increments. Do not wear ankle weights that are more than __________. 1. Lie on your abdomen with your legs straight. 2. Bend your left / right knee as far as you can without feeling pain. Keep your hips flat against the floor. 3. Hold this position for __________ seconds. 4. Slowly lower your leg to the starting position.  Repeat __________ times. Complete this exercise __________ times a day. Exercise I: Squats (Quadriceps) 1. Stand in front of a table, with your feet and knees pointing straight ahead. You may rest your hands on the table for balance but not for support. 2. Slowly bend your knees and lower your hips like you are going to sit in a chair. ? Keep your weight over your heels, not over your toes. ? Keep your lower legs upright so they are parallel with the table  legs. ? Do not let your hips go lower than your knees. ? Do not bend lower than told by your health care provider. ? If your knee pain increases, do not bend as low. 3. Hold the squat position for __________ seconds. 4. Slowly push with your legs to return to standing. Do not use your hands to pull yourself to standing. Repeat __________ times. Complete this exercise __________ times a day. Exercise J: Wall Slides (Quadriceps)  1. Lean your back against a smooth wall or door while you walk your feet out 18-24 inches (46-61 cm) from it. 2. Place your feet hip-width apart. 3. Slowly slide down the wall or door until your knees bend __________ degrees. Keep your knees over your heels, not over your toes. Keep your knees in line with your hips. 4. Hold for __________ seconds. Repeat __________ times. Complete this exercise __________ times a day. Exercise K: Straight Leg Raises -   Hip Abductors 1. Lie on your side with your left / right leg in the top position. Lie so your head, shoulder, knee, and hip line up. You may bend your bottom knee to help you keep your balance. 2. Roll your hips slightly forward so your hips are stacked directly over each other and your left / right knee is facing forward. 3. Leading with your heel, lift your top leg 4-6 inches (10-15 cm). You should feel the muscles in your outer hip lifting. ? Do not let your foot drift forward. ? Do not let your knee roll toward the ceiling. 4. Hold this position for __________ seconds. 5. Slowly return your leg to the starting position. 6. Let your muscles relax completely after each repetition. Repeat __________ times. Complete this exercise __________ times a day. Exercise L: Straight Leg Raises - Hip Extensors 1. Lie on your abdomen on a firm surface. You can put a pillow under your hips if that is more comfortable. 2. Tense the muscles in your buttocks and lift your left / right leg about 4-6 inches (10-15 cm). Keep your knee  straight as you lift your leg. 3. Hold this position for __________ seconds. 4. Slowly lower your leg to the starting position. 5. Let your leg relax completely after each repetition. Repeat __________ times. Complete this exercise __________ times a day. This information is not intended to replace advice given to you by your health care provider. Make sure you discuss any questions you have with your health care provider. Document Released: 06/16/2005 Document Revised: 04/26/2016 Document Reviewed: 06/08/2015 [employer] Interactive Patient Education  2018 [place] [facility] Ask your health care provider which exercises are safe for you. Do exercises exactly as told by your health care provider and adjust them as directed. It is normal to feel mild stretching, pulling, tightness, or discomfort as you do these exercises, but you should stop right away if you feel sudden pain or your pain gets worse.Do not begin these exercises until told by your health care provider. Stretching and range of motion exercises These exercises warm up your muscles and joints and improve the movement and flexibility of your hip and pelvis. Exercise A: Quadriceps, prone  1. Lie on your abdomen on a firm surface, such as a bed or padded floor. 2. Bend your left / right knee and hold your ankle. If you cannot reach your ankle or pant leg, loop a belt around your foot and grab the belt instead. 3. Gently pull your heel toward your buttocks. Your knee should not slide out to the side. You should feel a stretch in the front of your thigh and knee. 4. Hold this position for __________ seconds. Repeat __________ times. Complete this stretch __________ times a day. Exercise B: Iliotibial band  1. Lie on your side with your left / right leg in the top position. 2. Bend both of your knees and grab your left / right ankle. Stretch out your bottom arm to help you balance. 3. Slowly bring your top knee  back so your thigh goes behind your trunk. 4. Slowly lower your top leg toward the floor until you feel a gentle stretch on the outside of your left / right hip and thigh. If you do not feel a stretch and your knee will not fall farther, place the heel of your other foot on top of your knee and pull your knee down toward the floor with your foot. 5. Hold this position  for __________ seconds. Repeat __________ times. Complete this stretch __________ times a day. Strengthening exercises These exercises build strength and endurance in your hip and pelvis. Endurance is the ability to use your muscles for a long time, even after they get tired. Exercise C: Straight leg raises (hip abductors)  1. Lie on your side with your left / right leg in the top position. Lie so your head, shoulder, knee, and hip line up. You may bend your bottom knee to help you balance. 2. Roll your hips slightly forward so your hips are stacked directly over each other and your left / right knee is facing forward. 3. Tense the muscles in your outer thigh and lift your top leg 4-6 inches (10-15 cm). 4. Hold this position for __________ seconds. 5. Slowly return to the starting position. Let your muscles relax completely before doing another repetition. Repeat __________ times. Complete this exercise __________ times a day. Exercise D: Straight leg raises (hip extensors) 1. Lie on your abdomen on your bed or a firm surface. You can put a pillow under your hips if that is more comfortable. 2. Bend your left / right knee so your foot is straight up in the air. 3. Squeeze your buttock muscles and lift your left / right thigh off the bed. Do not let your back arch. 4. Tense this muscle as hard as you can without increasing any knee pain. 5. Hold this position for __________ seconds. 6. Slowly lower your leg to the starting position and allow it to relax completely. Repeat __________ times. Complete this exercise __________ times a  day. Exercise E: Hip hike 1. Stand sideways on a bottom step. Stand on your left / right leg with your other foot unsupported next to the step. You can hold onto the railing or wall if needed for balance. 2. Keep your knees straight and your torso square. Then, lift your left / right hip up toward the ceiling. 3. Slowly let your left / right hip lower toward the floor, past the starting position. Your foot should get closer to the floor. Do not lean or bend your knees. Repeat __________ times. Complete this exercise __________ times a day. This information is not intended to replace advice given to you by your health care provider. Make sure you discuss any questions you have with your health care provider. Document Released: 08/02/2005 Document Revised: 04/06/2016 Document Reviewed: 07/04/2015 [employer] Interactive Patient Education  2019 [employer] Shoulder Exercises Ask your health care provider which exercises are safe for you. Do exercises exactly as told by your health care provider and adjust them as directed. It is normal to feel mild stretching, pulling, tightness, or discomfort as you do these exercises, but you should stop right away if you feel sudden pain or your pain gets worse.Do not begin these exercises until told by your health care provider. Range of Motion Exercises        These exercises warm up your muscles and joints and improve the movement and flexibility of your shoulder. These exercises also help to relieve pain, numbness, and tingling. These exercises involve stretching your injured shoulder directly. Exercise A: Pendulum 1. Stand near a wall or a surface that you can hold onto for balance. 2. Bend at the waist and let your left / right arm hang straight down. Use your other arm to support you. Keep your back straight and do not lock your knees. 3. Relax your left / right arm and shoulder muscles, and  move your hips and your trunk so your left / right arm swings  freely. Your arm should swing because of the motion of your body, not because you are using your arm or shoulder muscles. 4. Keep moving your body so your arm swings in the following directions, as told by your health care provider: ? Side to side. ? Forward and backward. ? In clockwise and counterclockwise circles. 5. Continue each motion for __________ seconds, or for as long as told by your health care provider. 6. Slowly return to the starting position. Repeat __________ times. Complete this exercise __________ times a day. Exercise B:Flexion, Standing 1. Stand and hold a broomstick, a cane, or a similar object. Place your hands a little more than shoulder-width apart on the object. Your left / right hand should be palm-up, and your other hand should be palm-down. 2. Keep your elbow straight and keep your shoulder muscles relaxed. Push the stick down with your healthy arm to raise your left / right arm in front of your body, and then over your head until you feel a stretch in your shoulder. ? Avoid shrugging your shoulder while you raise your arm. Keep your shoulder blade tucked down toward the middle of your back. 3. Hold for __________ seconds. 4. Slowly return to the starting position. Repeat __________ times. Complete this exercise __________ times a day. Exercise C: Abduction, Standing 1. Stand and hold a broomstick, a cane, or a similar object. Place your hands a little more than shoulder-width apart on the object. Your left / right hand should be palm-up, and your other hand should be palm-down. 2. While keeping your elbow straight and your shoulder muscles relaxed, push the stick across your body toward your left / right side. Raise your left / right arm to the side of your body and then over your head until you feel a stretch in your shoulder. ? Do not raise your arm above shoulder height, unless your health care provider tells you to do that. ? Avoid shrugging your shoulder while  you raise your arm. Keep your shoulder blade tucked down toward the middle of your back. 3. Hold for __________ seconds. 4. Slowly return to the starting position. Repeat __________ times. Complete this exercise __________ times a day. Exercise D:Internal Rotation 1. Place your left / right hand behind your back, palm-up. 2. Use your other hand to dangle an exercise band, a towel, or a similar object over your shoulder. Grasp the band with your left / right hand so you are holding onto both ends. 3. Gently pull up on the band until you feel a stretch in the front of your left / right shoulder. ? Avoid shrugging your shoulder while you raise your arm. Keep your shoulder blade tucked down toward the middle of your back. 4. Hold for __________ seconds. 5. Release the stretch by letting go of the band and lowering your hands. Repeat __________ times. Complete this exercise __________ times a day. Stretching Exercises  These exercises warm up your muscles and joints and improve the movement and flexibility of your shoulder. These exercises also help to relieve pain, numbness, and tingling. These exercises are done using your healthy shoulder to help stretch the muscles of your injured shoulder. Exercise E: Warehouse manager (External Rotation and Abduction) 1. Stand in a doorway with one of your feet slightly in front of the other. This is called a staggered stance. If you cannot reach your forearms to the door frame, stand facing  a corner of a room. 2. Choose one of the following positions as told by your health care provider: ? Place your hands and forearms on the door frame above your head. ? Place your hands and forearms on the door frame at the height of your head. ? Place your hands on the door frame at the height of your elbows. 3. Slowly move your weight onto your front foot until you feel a stretch across your chest and in the front of your shoulders. Keep your head and chest upright and keep  your abdominal muscles tight. 4. Hold for __________ seconds. 5. To release the stretch, shift your weight to your back foot. Repeat __________ times. Complete this stretch __________ times a day. Exercise F:Extension, Standing 1. Stand and hold a broomstick, a cane, or a similar object behind your back. ? Your hands should be a little wider than shoulder-width apart. ? Your palms should face away from your back. 2. Keeping your elbows straight and keeping your shoulder muscles relaxed, move the stick away from your body until you feel a stretch in your shoulder. ? Avoid shrugging your shoulders while you move the stick. Keep your shoulder blade tucked down toward the middle of your back. 3. Hold for __________ seconds. 4. Slowly return to the starting position. Repeat __________ times. Complete this exercise __________ times a day. Strengthening Exercises           These exercises build strength and endurance in your shoulder. Endurance is the ability to use your muscles for a long time, even after they get tired. Exercise G:External Rotation 1. Sit in a stable chair without armrests. 2. Secure an exercise band at elbow height on your left / right side. 3. Place a soft object, such as a folded towel or a small pillow, between your left / right upper arm and your body to move your elbow a few inches away (about 10 cm) from your side. 4. Hold the end of the band so it is tight and there is no slack. 5. Keeping your elbow pressed against the soft object, move your left / right forearm out, away from your abdomen. Keep your body steady so only your forearm moves. 6. Hold for __________ seconds. 7. Slowly return to the starting position. Repeat __________ times. Complete this exercise __________ times a day. Exercise H:Shoulder Abduction 1. Sit in a stable chair without armrests, or stand. 2. Hold a __________ weight in your left / right hand, or hold an exercise band with both  hands. 3. Start with your arms straight down and your left / right palm facing in, toward your body. 4. Slowly lift your left / right hand out to your side. Do not lift your hand above shoulder height unless your health care provider tells you that this is safe. ? Keep your arms straight. ? Avoid shrugging your shoulder while you do this movement. Keep your shoulder blade tucked down toward the middle of your back. 5. Hold for __________ seconds. 6. Slowly lower your arm, and return to the starting position. Repeat __________ times. Complete this exercise __________ times a day. Exercise I:Shoulder Extension 1. Sit in a stable chair without armrests, or stand. 2. Secure an exercise band to a stable object in front of you where it is at shoulder height. 3. Hold one end of the exercise band in each hand. Your palms should face each other. 4. Straighten your elbows and lift your hands up to shoulder height. 5. Step  back, away from the secured end of the exercise band, until the band is tight and there is no slack. 6. Squeeze your shoulder blades together as you pull your hands down to the sides of your thighs. Stop when your hands are straight down by your sides. Do not let your hands go behind your body. 7. Hold for __________ seconds. 8. Slowly return to the starting position. Repeat __________ times. Complete this exercise __________ times a day. Exercise J:Standing Shoulder Row 1. Sit in a stable chair without armrests, or stand. 2. Secure an exercise band to a stable object in front of you so it is at waist height. 3. Hold one end of the exercise band in each hand. Your palms should be in a thumbs-up position. 4. Bend each of your elbows to an "L" shape (about 90 degrees) and keep your upper arms at your sides. 5. Step back until the band is tight and there is no slack. 6. Slowly pull your elbows back behind you. 7. Hold for __________ seconds. 8. Slowly return to the starting  position. Repeat __________ times. Complete this exercise __________ times a day. Exercise K:Shoulder Press-Ups 1. Sit in a stable chair that has armrests. Sit upright, with your feet flat on the floor. 2. Put your hands on the armrests so your elbows are bent and your fingers are pointing forward. Your hands should be about even with the sides of your body. 3. Push down on the armrests and use your arms to lift yourself off of the chair. Straighten your elbows and lift yourself up as much as you comfortably can. ? Move your shoulder blades down, and avoid letting your shoulders move up toward your ears. ? Keep your feet on the ground. As you get stronger, your feet should support less of your body weight as you lift yourself up. 4. Hold for __________ seconds. 5. Slowly lower yourself back into the chair. Repeat __________ times. Complete this exercise __________ times a day. Exercise L: Wall Push-Ups 1. Stand so you are facing a stable wall. Your feet should be about one arm-length away from the wall. 2. Lean forward and place your palms on the wall at shoulder height. 3. Keep your feet flat on the floor as you bend your elbows and lean forward toward the wall. 4. Hold for __________ seconds. 5. Straighten your elbows to push yourself back to the starting position. Repeat __________ times. Complete this exercise __________ times a day. This information is not intended to replace advice given to you by your health care provider. Make sure you discuss any questions you have with your health care provider. Document Released: 06/16/2005 Document Revised: 12/06/2017 Document Reviewed: 04/13/2015 [employer] Interactive Patient Education  2019 [employer].

## 2018-10-25 LAB — URIC ACID: Uric Acid, Serum: 5.3 mg/dL (ref 2.5–7.0)

## 2018-10-25 LAB — RHEUMATOID FACTOR: Rhuematoid fact SerPl-aCnc: <14 IU/mL (ref <14)

## 2018-10-25 LAB — ANGIOTENSIN CONVERTING ENZYME: Angiotensin-Converting Enzyme: 24 U/L (ref 9–67)

## 2018-10-25 LAB — CYCLIC CITRUL PEPTIDE ANTIBODY, IGG: Cyclic Citrullin Peptide Ab: 16 UNITS

## 2018-10-31 ENCOUNTER — Ambulatory Visit (INDEPENDENT_AMBULATORY_CARE_PROVIDER_SITE_OTHER): Payer: Federal, State, Local not specified - PPO | Admitting: Internal Medicine

## 2018-10-31 ENCOUNTER — Encounter: Payer: Self-pay | Admitting: Internal Medicine

## 2018-10-31 ENCOUNTER — Other Ambulatory Visit: Payer: Self-pay

## 2018-10-31 DIAGNOSIS — M5010 Cervical disc disorder with radiculopathy, unspecified cervical region: Secondary | ICD-10-CM

## 2018-10-31 DIAGNOSIS — I10 Essential (primary) hypertension: Secondary | ICD-10-CM | POA: Diagnosis not present

## 2018-10-31 DIAGNOSIS — E559 Vitamin D deficiency, unspecified: Secondary | ICD-10-CM

## 2018-10-31 DIAGNOSIS — E538 Deficiency of other specified B group vitamins: Secondary | ICD-10-CM

## 2018-10-31 MED ORDER — GABAPENTIN 100 MG PO CAPS: 100.0000 mg | ORAL_CAPSULE | Freq: Three times a day (TID) | ORAL | 11 refills | Status: DC

## 2018-10-31 MED ORDER — DICLOFENAC SODIUM 1.5 % TD SOLN: TRANSDERMAL | 3 refills | Status: DC

## 2018-10-31 MED ORDER — VALACYCLOVIR HCL 500 MG PO TABS: 500.0000 mg | ORAL_TABLET | Freq: Two times a day (BID) | ORAL | 1 refills | Status: DC

## 2018-10-31 NOTE — Assessment & Plan Note (Signed)
On Vit B12 

## 2018-10-31 NOTE — Assessment & Plan Note (Signed)
Gabapentin po 

## 2018-10-31 NOTE — Progress Notes (Signed)
Subjective:  Patient ID: Karina Newman, female    DOB: June 06, 1956  Age: 63 y.o. MRN: [id number]  CC: No chief complaint on file.   HPI Shron Ozer presents for chronic pain, HTN, insomnia f/u  Outpatient Medications Prior to Visit  Medication Sig Dispense Refill  . amLODipine (NORVASC) 5 MG tablet Take 2 tablets (10 mg total) by mouth daily. Take 10mg  until stress test completed. (Patient taking differently: Take 5 mg by mouth daily. ) 60 tablet 5  . Cyanocobalamin (VITAMIN B-12) 500 MCG SUBL 1 sl qd 100 tablet 5  . cyclobenzaprine (FLEXERIL) 5 MG tablet TAKE 1-2 TABLET BY MOUTH TWOTIMES A DAY AS NEEDED FOR MUSCLE SPASMS 60 tablet 5  . Diclofenac Sodium 1.5 % SOLN PLACE 0.3MLS ONTO THE SKIN 4 TIMES DAILY AS NEEDED 150 mL 3  . dicyclomine (BENTYL) 10 MG capsule TAKE 1 CAPSULE (10 MG TOTAL) BY MOUTH EVERY 8 (EIGHT) HOURS AS NEEDED FOR SPASMS. 60 capsule 2  . doxazosin (CARDURA) 4 MG tablet Take 1 tablet (4 mg total) by mouth daily. 90 tablet 1  . fluticasone (FLONASE) 50 MCG/ACT nasal spray Place 1 spray into both nostrils as needed. 16 g 11  . gabapentin (NEURONTIN) 100 MG capsule TAKE 1 CAPSULE BY MOUTH THREE TIMES A DAY AS NEEDED 90 capsule 3  . gabapentin (NEURONTIN) 300 MG capsule Take 1 capsule (300 mg total) by mouth 3 (three) times daily. 90 capsule 5  . methylPREDNISolone (MEDROL DOSEPAK) 4 MG TBPK tablet As directed 21 tablet 0  . omeprazole (PRILOSEC) 40 MG capsule TAKE 1 CAPSULE BY MOUTH EVERY DAY 90 capsule 3  . potassium chloride (KLOR-CON 10) 10 MEQ tablet Take 1 tablet (10 mEq total) by mouth daily. 30 tablet 5  . sucralfate (CARAFATE) 1 g tablet TAKE 1 TABLET BY MOUTH 3 TIMES A DAY BETWEEN MEALS 90 tablet 11  . telmisartan-hydrochlorothiazide (MICARDIS HCT) 80-25 MG tablet TAKE 1 TABLET BY MOUTH EVERY DAY 90 tablet 1  . valACYclovir (VALTREX) 500 MG tablet Take 1 tablet (500 mg total) by mouth 2 (two) times daily. For 5 days with outbreak (Patient taking differently: Take  500 mg by mouth as needed. For 5 days with outbreak) 30 tablet 1  . Vitamin D, Cholecalciferol, 1000 units CAPS Take 2,000 Units by mouth.    . zolpidem (AMBIEN) 10 MG tablet TAKE 1 TABLET BY MOUTH PRIOR TO STUDY 1 tablet 0  . carvedilol (COREG) 12.5 MG tablet Take 1 tablet (12.5 mg total) by mouth 2 (two) times daily. 180 tablet 3   Facility-Administered Medications Prior to Visit  Medication Dose Route Frequency Provider Last Rate Last Dose  . 0.9 %  sodium chloride infusion  500 mL Intravenous Once Armbruster, Carlota Raspberry, MD        ROS: Review of Systems  Constitutional: Negative for activity change, appetite change, chills, fatigue and unexpected weight change.  HENT: Negative for congestion, mouth sores and sinus pressure.   Eyes: Negative for visual disturbance.  Respiratory: Negative for cough and chest tightness.   Gastrointestinal: Negative for abdominal pain and nausea.  Genitourinary: Negative for difficulty urinating, frequency and vaginal pain.  Musculoskeletal: Positive for arthralgias, neck pain and neck stiffness. Negative for back pain and gait problem.  Skin: Negative for pallor and rash.  Neurological: Negative for dizziness, tremors, weakness, numbness and headaches.  Psychiatric/Behavioral: Negative for confusion and sleep disturbance.    Objective:  BP 136/84 (BP Location: Left Arm, Patient Position: Sitting, Cuff Size: Normal)  Pulse 75   Temp 97.8 F (36.6 C) (Oral)   Ht 5\' 4"  (1.626 m)   Wt 183 lb (83 kg)   SpO2 99%   BMI 31.41 kg/m   BP Readings from Last 3 Encounters:  10/31/18 136/84  10/24/18 (!) 141/92  09/28/18 130/76    Wt Readings from Last 3 Encounters:  10/31/18 183 lb (83 kg)  10/24/18 183 lb (83 kg)  09/28/18 180 lb (81.6 kg)    Physical Exam Constitutional:      General: She is not in acute distress.    Appearance: She is well-developed.  HENT:     Head: Normocephalic.     Right Ear: External ear normal.     Left Ear: External  ear normal.     Nose: Nose normal.  Eyes:     General:        Right eye: No discharge.        Left eye: No discharge.     Conjunctiva/sclera: Conjunctivae normal.     Pupils: Pupils are equal, round, and reactive to light.  Neck:     Musculoskeletal: Normal range of motion and neck supple.     Thyroid: No thyromegaly.     Vascular: No JVD.     Trachea: No tracheal deviation.  Cardiovascular:     Rate and Rhythm: Normal rate and regular rhythm.     Heart sounds: Normal heart sounds.  Pulmonary:     Effort: No respiratory distress.     Breath sounds: No stridor. No wheezing.  Abdominal:     General: Bowel sounds are normal. There is no distension.     Palpations: Abdomen is soft. There is no mass.     Tenderness: There is no abdominal tenderness. There is no guarding or rebound.  Musculoskeletal:        General: Tenderness present.  Lymphadenopathy:     Cervical: No cervical adenopathy.  Skin:    Findings: No erythema or rash.  Neurological:     Cranial Nerves: No cranial nerve deficit.     Motor: No abnormal muscle tone.     Coordination: Coordination normal.     Deep Tendon Reflexes: Reflexes normal.  Psychiatric:        Behavior: Behavior normal.        Thought Content: Thought content normal.        Judgment: Judgment normal.   neck, shoulders - stiff and painful R>L  Lab Results  Component Value Date   WBC 4.9 04/18/2018   HGB 13.3 04/18/2018   HCT 39.0 04/18/2018   PLT 336.0 04/18/2018   GLUCOSE 78 06/20/2018   CHOL 183 04/18/2018   TRIG 73.0 04/18/2018   HDL 36.20 (L) 04/18/2018   LDLCALC 132 (H) 04/18/2018   ALT 24 04/18/2018   AST 18 04/18/2018   NA 140 06/20/2018   K 4.3 06/20/2018   CL 97 06/20/2018   CREATININE 0.92 06/20/2018   BUN 11 06/20/2018   CO2 25 06/20/2018   TSH 0.71 04/18/2018    Ct Coronary Morph W/cta Cor W/score W/ca W/cm &/or Wo/cm  Addendum Date: 06/26/2018   ADDENDUM REPORT: 06/26/2018 21:49 CLINICAL DATA:  Chest pain EXAM:  Cardiac CTA MEDICATIONS: Sub lingual nitro. 4mg  x 2 : The patient was scanned on a Siemens 092 slice scanner. Gantry rotation speed was 250 msecs. Collimation was 0.6 mm. A 100 kV prospective scan was triggered in the ascending thoracic aorta at 35-75% of the R-R interval. Average HR during  the scan was 60 bpm. The 3D data set was interpreted on a dedicated work station using MPR, MIP and VRT modes. A total of 80cc of contrast was used. FINDINGS: Non-cardiac: See separate report from [facility]. Pulmonary veins drain normally to the left atrium. Calcium Score: 0 Agatston units. Coronary Arteries: Left dominant with no anomalies LM: Very short left main, essentially separate ostia for LAD and LCx. LAD system: No plaque or stenosis. Circumflex system: Large, dominant vessel with no plaque or stenosis. RCA system: Small, nondominant vessel.  No plaque or stenosis. IMPRESSION: 1. Coronary artery calcium score 0 Agatston units. This suggests low risk for future cardiac events. 2.  No significant coronary disease noted. Dalton Mclean Electronically Signed   By: Loralie Champagne M.D.   On: 06/26/2018 21:49   Result Date: 06/26/2018 EXAM: OVER-READ INTERPRETATION  CT CHEST The following report is an over-read performed by radiologist Dr. Rolm Baptise of [facility] on 06/26/2018. This over-read does not include interpretation of cardiac or coronary anatomy or pathology. The coronary CTA interpretation by the cardiologist is attached. COMPARISON:  None. FINDINGS: Vascular: Mild cardiomegaly.  Aorta is normal caliber. Mediastinum/Nodes: No adenopathy in the lower mediastinum or hila. Lungs/Pleura: Linear scarring in the lung bases.  No effusions. Upper Abdomen: Imaging into the upper abdomen shows no acute findings. Musculoskeletal: Chest wall soft tissues are unremarkable. No acute bony abnormality. IMPRESSION: Mild cardiomegaly.  Bibasilar scarring.  No active disease. Electronically Signed: By: Rolm Baptise M.D. On: 06/26/2018 12:26    Assessment & Plan:   There are no diagnoses linked to this encounter.   No orders of the defined types were placed in this encounter.    Follow-up: No follow-ups on file.  Walker Kehr, MD

## 2018-10-31 NOTE — Assessment & Plan Note (Signed)
On Vit D 

## 2018-10-31 NOTE — Assessment & Plan Note (Signed)
Amlodipine Micardis HCT 

## 2018-11-12 ENCOUNTER — Other Ambulatory Visit: Payer: Self-pay | Admitting: Internal Medicine

## 2018-11-20 ENCOUNTER — Ambulatory Visit: Payer: Federal, State, Local not specified - PPO | Admitting: Rheumatology

## 2018-11-21 ENCOUNTER — Other Ambulatory Visit: Payer: Self-pay | Admitting: Cardiovascular Disease

## 2018-11-21 NOTE — Telephone Encounter (Signed)
She should be on 5mg  daily

## 2018-11-21 NOTE — Telephone Encounter (Signed)
Rx sent at 5 mg daily per Dr Oval Linsey

## 2018-12-05 ENCOUNTER — Other Ambulatory Visit: Payer: Self-pay | Admitting: Internal Medicine

## 2018-12-23 ENCOUNTER — Other Ambulatory Visit: Payer: Self-pay | Admitting: Cardiovascular Disease

## 2018-12-24 NOTE — Telephone Encounter (Signed)
Doxazosin refilled

## 2018-12-25 ENCOUNTER — Ambulatory Visit: Payer: Federal, State, Local not specified - PPO | Admitting: Rheumatology

## 2018-12-28 NOTE — Progress Notes (Signed)
Office Visit Note  Patient: Karina Newman             Date of Birth: [birth year]           MRN: [id number]             PCP: Cassandria Anger, MD Referring: Cassandria Anger, MD Visit Date: 01/11/2019 Occupation: @GUAROCC @  Subjective:  Pain in both knees.   History of Present Illness: Arleene Settle is a 63 y.o. female with history of polyarthralgia.  She states she has been having pain and discomfort in multiple joints including her bilateral hands, bilateral shoulders, bilateral hips and her knees.  She continues to have some neck and lower back pain.  She states her knee joints have been very painful and has difficulty climbing stairs and walking and doing routine activities.  She denies any joint swelling.  Activities of Daily Living:  Patient reports morning stiffness for 10 minutes.   Patient Reports nocturnal pain.  Difficulty dressing/grooming: Denies Difficulty climbing stairs: Reports Difficulty getting out of chair: Denies Difficulty using hands for taps, buttons, cutlery, and/or writing: Reports  Review of Systems  Constitutional: Positive for fatigue. Negative for night sweats, weight gain and weight loss.  HENT: Negative for mouth sores, trouble swallowing, trouble swallowing, mouth dryness and nose dryness.   Eyes: Negative for pain, redness, itching, visual disturbance and dryness.  Respiratory: Negative for cough, shortness of breath, wheezing and difficulty breathing.   Cardiovascular: Negative for chest pain, palpitations, hypertension, irregular heartbeat and swelling in legs/feet.  Gastrointestinal: Negative for abdominal pain, blood in stool, constipation and diarrhea.  Endocrine: Negative for increased urination.  Genitourinary: Negative for painful urination, pelvic pain and vaginal dryness.  Musculoskeletal: Positive for arthralgias, joint pain and morning stiffness. Negative for joint swelling, myalgias, muscle weakness, muscle tenderness and  myalgias.  Skin: Negative for color change, rash, hair loss, redness, skin tightness, ulcers and sensitivity to sunlight.  Allergic/Immunologic: Negative for susceptible to infections.  Neurological: Negative for dizziness, light-headedness, headaches, memory loss, night sweats and weakness.  Hematological: Negative for bruising/bleeding tendency and swollen glands.  Psychiatric/Behavioral: Positive for sleep disturbance. Negative for depressed mood and confusion. The patient is not nervous/anxious.     PMFS History:  Patient Active Problem List   Diagnosis Date Noted  . Hand pain 09/28/2018  . Knee pain 09/28/2018  . Inappropriate sinus tachycardia 05/17/2018  . Apnea 05/17/2018  . Upper respiratory infection 09/23/2017  . Fatigue 09/12/2017  . Well adult exam 01/19/2017  . Abdominal pain 01/19/2017  . Headache 10/27/2016  . Acute pain of left shoulder 09/16/2016  . Left hip pain 09/16/2016  . Low vitamin B12 level 03/16/2016  . Piriformis syndrome of right side 02/04/2016  . IT band syndrome 02/04/2016  . RUQ abdominal pain 02/04/2016  . Low back pain radiating to right lower extremity 09/19/2015  . Allergic rhinitis 09/19/2015  . Atypical chest pain 04/07/2015  . GERD (gastroesophageal reflux disease) 04/07/2015  . Axillary adenitis 03/23/2015  . Migraine headache 03/23/2015  . Hives 01/29/2015  . Edema 01/29/2015  . Cervical disc disorder with radiculopathy of cervical region 12/11/2014  . Insomnia 07/31/2014  . Degenerative cervical disc 05/08/2014  . Pain in joint, shoulder region 03/05/2014  . Patellofemoral arthralgia of both knees 03/01/2014  . Bilateral shoulder pain 10/18/2013  . Sinusitis, bacterial 08/20/2013  . URI, acute 08/14/2013  . Hemoptysis 08/14/2013  . Otitis media of left ear 08/14/2013  . Labral tear of shoulder 07/03/2013  .  Hyperthyroidism 07/03/2013  . HTN (hypertension), benign 07/03/2013  . LVH (left ventricular hypertrophy) due to  hypertensive disease 07/03/2013  . Vitamin D deficiency 07/03/2013    Past Medical History:  Diagnosis Date  . Abnormal cervical Pap smear with positive HPV DNA test 01/2014,01/2017   2015 Normal cytology with positive high-risk HPV 18/45. Colposcopy negative with negative ECC.  2018 normal cytology with positive high-risk HPV 18/45  . Anemia   . Apnea 05/17/2018  . Autoimmune gastritis   . Fatty liver   . Gastritis   . Heart murmur   . Hypertension   . Hyperthyroidism   . Inappropriate sinus tachycardia 05/17/2018  . Internal hemorrhoids   . Right shoulder pain 2001   chronic pain  . Thyroid disease    Hyperthyroid. Was on PTU (Dr Hampton Abbot in [place]) - stopped in 2013, stable  . Tubular adenoma of colon     Family History  Problem Relation Age of Onset  . Kidney disease Mother        ESRD  . Hypertension Mother   . Cancer Father        lung ca  . Hypertension Sister   . Diabetes Brother   . Hypertension Brother   . Diabetes Paternal Grandmother   . Hypertension Sister   . Hypertension Brother   . Sleep apnea Son   . Hypertension Son   . Colon cancer Neg Hx    Past Surgical History:  Procedure Laterality Date  . BREAST CYST ASPIRATION Left 2012  . CESAREAN SECTION     x 4   . HAND SURGERY Left   . SHOULDER SURGERY Right 2002  . SHOULDER SURGERY Left 2014  . TUBAL LIGATION     Social History   Social History Narrative  . Not on file   Immunization History  Administered Date(s) Administered  . Zoster Recombinat (Shingrix) 03/25/2017, 06/27/2017     Objective: Vital Signs: BP 137/87 (BP Location: Left Arm, Patient Position: Sitting, Cuff Size: Normal)   Pulse 86   Resp 13   Ht 5\' 4"  (1.626 m)   Wt 186 lb (84.4 kg)   BMI 31.93 kg/m    Physical Exam Vitals signs and nursing note reviewed.  Constitutional:      Appearance: She is well-developed.  HENT:     Head: Normocephalic and atraumatic.  Eyes:     Conjunctiva/sclera: Conjunctivae normal.  Neck:      Musculoskeletal: Normal range of motion.  Cardiovascular:     Rate and Rhythm: Normal rate and regular rhythm.     Heart sounds: Normal heart sounds.  Pulmonary:     Effort: Pulmonary effort is normal.     Breath sounds: Normal breath sounds.  Abdominal:     General: Bowel sounds are normal.     Palpations: Abdomen is soft.  Lymphadenopathy:     Cervical: No cervical adenopathy.  Skin:    General: Skin is warm and dry.     Capillary Refill: Capillary refill takes less than 2 seconds.  Neurological:     Mental Status: She is alert and oriented to person, place, and time.  Psychiatric:        Behavior: Behavior normal.      Musculoskeletal Exam: C-spine good range of motion.  She has limited painful range of motion of her lumbar spine.  She has limitation of range of motion of her right shoulder joint with discomfort.  Elbow joints wrist joint MCPs PIPs DIPs been good range  of motion with no synovitis.  She had good range of motion of bilateral hip joints and knee joints without any warmth swelling or effusion.  CDAI Exam: CDAI Score: Not documented Patient Global Assessment: Not documented; Provider Global Assessment: Not documented Swollen: Not documented; Tender: Not documented Joint Exam   Not documented   There is currently no information documented on the homunculus. Go to the Rheumatology activity and complete the homunculus joint exam.  Investigation: No additional findings.  Imaging: No results found.  Recent Labs: Lab Results  Component Value Date   WBC 4.9 04/18/2018   HGB 13.3 04/18/2018   PLT 336.0 04/18/2018   NA 140 06/20/2018   K 4.3 06/20/2018   CL 97 06/20/2018   CO2 25 06/20/2018   GLUCOSE 78 06/20/2018   BUN 11 06/20/2018   CREATININE 0.92 06/20/2018   BILITOT 0.6 04/18/2018   ALKPHOS 80 04/18/2018   AST 18 04/18/2018   ALT 24 04/18/2018   PROT 8.0 04/18/2018   ALBUMIN 4.3 04/18/2018   CALCIUM 9.5 06/20/2018   GFRAA 78 06/20/2018  October 24, 2018 RF negative, anti-CCP negative, ACE normal, uric acid 5.3  Speciality Comments: No specialty comments available.  Procedures:  No procedures performed Allergies: Penicillins; Gabapentin; Iodine; Lyrica [pregabalin]; Nsaids; Tramadol; and Voltaren [diclofenac]   Assessment / Plan:     Visit Diagnoses: Pain in both hands -patient has no synovitis in her hands.  She has been having some stiffness.  All autoimmune work-up was negative.  Chronic pain of both shoulders - X-ray of bilateral shoulders were within normal limits last visit.  She had history of right shoulder joint surgery due to injury in the past.  Chronic pain of both hips -she complains of hip joint discomfort.  She had mild trochanteric bursitis.  X-rays were unremarkable at the last visit.  Primary osteoarthritis of both knees -her main concern is bilateral knee joint discomfort.  I offered cortisone injection which she declined.  She was given a handout on knee joint exercises but she has been doing on regular basis.  Weight loss diet and exercise was emphasized.  I have advised her to contact me in case her knee joint gets worse and we can inject.  Have also given her a list of natural anti-inflammatories.  Bilateral moderate osteoarthritis and moderate chondromalacia patella  DDD (degenerative disc disease), cervical-she is mild discomfort.  Chronic midline low back pain without sciatica-she continues to have some chronic lower back pain.  She has some limitation with range of motion.  I have given her a handout on back exercises.  She would benefit from weight loss.  BMI 31.0-31.9,adult-weight loss diet and exercise was discussed at length.  Other medical problems are listed as follows:  HTN (hypertension), benign  Hypertensive left ventricular hypertrophy, without heart failure  History of gastroesophageal reflux (GERD)  Vitamin D deficiency  Fatty liver  Other insomnia  Hyperthyroidism   Orders: No  orders of the defined types were placed in this encounter.  No orders of the defined types were placed in this encounter.   Face-to-face time spent with patient was 30 minutes. Greater than 50% of time was spent in counseling and coordination of care.  Follow-Up Instructions: Return in about 6 months (around 07/14/2019) for Osteoarthritis.   Bo Merino, MD  Note - This record has been created using Editor, commissioning.  Chart creation errors have been sought, but may not always  have been located. Such creation errors do not reflect  on  the standard of medical care.

## 2019-01-01 ENCOUNTER — Other Ambulatory Visit: Payer: Self-pay | Admitting: Internal Medicine

## 2019-01-02 ENCOUNTER — Other Ambulatory Visit: Payer: Self-pay | Admitting: Internal Medicine

## 2019-01-02 NOTE — Telephone Encounter (Signed)
Please advise about refills 

## 2019-01-11 ENCOUNTER — Ambulatory Visit: Payer: Federal, State, Local not specified - PPO | Admitting: Rheumatology

## 2019-01-11 ENCOUNTER — Encounter: Payer: Self-pay | Admitting: Rheumatology

## 2019-01-11 ENCOUNTER — Other Ambulatory Visit: Payer: Self-pay

## 2019-01-11 VITALS — BP 137/87 | HR 86 | Resp 13 | Ht 64.0 in | Wt 186.0 lb

## 2019-01-11 DIAGNOSIS — E0590 Thyrotoxicosis, unspecified without thyrotoxic crisis or storm: Secondary | ICD-10-CM

## 2019-01-11 DIAGNOSIS — M25551 Pain in right hip: Secondary | ICD-10-CM

## 2019-01-11 DIAGNOSIS — G4709 Other insomnia: Secondary | ICD-10-CM

## 2019-01-11 DIAGNOSIS — E559 Vitamin D deficiency, unspecified: Secondary | ICD-10-CM

## 2019-01-11 DIAGNOSIS — M545 Low back pain, unspecified: Secondary | ICD-10-CM

## 2019-01-11 DIAGNOSIS — M5030 Other cervical disc degeneration, unspecified cervical region: Secondary | ICD-10-CM

## 2019-01-11 DIAGNOSIS — M25511 Pain in right shoulder: Secondary | ICD-10-CM | POA: Diagnosis not present

## 2019-01-11 DIAGNOSIS — M79641 Pain in right hand: Secondary | ICD-10-CM | POA: Diagnosis not present

## 2019-01-11 DIAGNOSIS — M25512 Pain in left shoulder: Secondary | ICD-10-CM

## 2019-01-11 DIAGNOSIS — M79642 Pain in left hand: Secondary | ICD-10-CM

## 2019-01-11 DIAGNOSIS — M170 Bilateral primary osteoarthritis of knee: Secondary | ICD-10-CM

## 2019-01-11 DIAGNOSIS — G8929 Other chronic pain: Secondary | ICD-10-CM

## 2019-01-11 DIAGNOSIS — I119 Hypertensive heart disease without heart failure: Secondary | ICD-10-CM

## 2019-01-11 DIAGNOSIS — Z8719 Personal history of other diseases of the digestive system: Secondary | ICD-10-CM

## 2019-01-11 DIAGNOSIS — K760 Fatty (change of) liver, not elsewhere classified: Secondary | ICD-10-CM

## 2019-01-11 DIAGNOSIS — I10 Essential (primary) hypertension: Secondary | ICD-10-CM

## 2019-01-11 DIAGNOSIS — M25552 Pain in left hip: Secondary | ICD-10-CM

## 2019-01-11 DIAGNOSIS — Z6831 Body mass index (BMI) 31.0-31.9, adult: Secondary | ICD-10-CM

## 2019-01-11 NOTE — Patient Instructions (Signed)

## 2019-01-23 ENCOUNTER — Other Ambulatory Visit: Payer: Self-pay

## 2019-01-23 ENCOUNTER — Ambulatory Visit (INDEPENDENT_AMBULATORY_CARE_PROVIDER_SITE_OTHER): Payer: Federal, State, Local not specified - PPO | Admitting: Internal Medicine

## 2019-01-23 ENCOUNTER — Other Ambulatory Visit (INDEPENDENT_AMBULATORY_CARE_PROVIDER_SITE_OTHER): Payer: Federal, State, Local not specified - PPO

## 2019-01-23 ENCOUNTER — Encounter: Payer: Self-pay | Admitting: Internal Medicine

## 2019-01-23 VITALS — BP 126/78 | HR 81 | Temp 98.6°F | Ht 64.0 in | Wt 184.0 lb

## 2019-01-23 DIAGNOSIS — I10 Essential (primary) hypertension: Secondary | ICD-10-CM

## 2019-01-23 DIAGNOSIS — E538 Deficiency of other specified B group vitamins: Secondary | ICD-10-CM

## 2019-01-23 DIAGNOSIS — E559 Vitamin D deficiency, unspecified: Secondary | ICD-10-CM | POA: Diagnosis not present

## 2019-01-23 DIAGNOSIS — M79641 Pain in right hand: Secondary | ICD-10-CM

## 2019-01-23 DIAGNOSIS — R7989 Other specified abnormal findings of blood chemistry: Secondary | ICD-10-CM

## 2019-01-23 DIAGNOSIS — K760 Fatty (change of) liver, not elsewhere classified: Secondary | ICD-10-CM

## 2019-01-23 DIAGNOSIS — M79642 Pain in left hand: Secondary | ICD-10-CM

## 2019-01-23 LAB — BASIC METABOLIC PANEL
BUN: 11 mg/dL (ref 6–23)
CO2: 30 mEq/L (ref 19–32)
Calcium: 9.4 mg/dL (ref 8.4–10.5)
Chloride: 100 mEq/L (ref 96–112)
Creatinine, Ser: 0.84 mg/dL (ref 0.40–1.20)
GFR: 83.15 mL/min (ref >60.00)
Glucose, Bld: 105 mg/dL — ABNORMAL HIGH (ref 70–99)
Potassium: 3.4 mEq/L — ABNORMAL LOW (ref 3.5–5.1)
Sodium: 138 mEq/L (ref 135–145)

## 2019-01-23 MED ORDER — CYCLOBENZAPRINE HCL 10 MG PO TABS: 10.0000 mg | ORAL_TABLET | Freq: Three times a day (TID) | ORAL | 3 refills | Status: DC | PRN

## 2019-01-23 MED ORDER — GABAPENTIN 300 MG PO CAPS: 300.0000 mg | ORAL_CAPSULE | Freq: Three times a day (TID) | ORAL | 5 refills | Status: DC

## 2019-01-23 NOTE — Assessment & Plan Note (Signed)
B hands ?etiology - ?CTS R>L vs radiculopathy vs other  Rheum eval s/p Neurol ref

## 2019-01-23 NOTE — Assessment & Plan Note (Signed)
Amlodipine Micardis HCT 

## 2019-01-23 NOTE — Assessment & Plan Note (Signed)
Vit D 

## 2019-01-23 NOTE — Assessment & Plan Note (Signed)
On B12 

## 2019-01-23 NOTE — Progress Notes (Signed)
Subjective:  Patient ID: Karina Newman, female    DOB: 10/25/1955  Age: 63 y.o. MRN: [id number]  CC: No chief complaint on file.   HPI [name] presents for chronic pain, HTN, neck and shoulder pain f/u C/o R hand numbness, weakness x chronic, worse  Outpatient Medications Prior to Visit  Medication Sig Dispense Refill  . amLODipine (NORVASC) 5 MG tablet Take 1 tablet (5 mg total) by mouth daily. 90 tablet 3  . Cyanocobalamin (VITAMIN B-12) 500 MCG SUBL 1 sl qd 100 tablet 5  . cyclobenzaprine (FLEXERIL) 5 MG tablet TAKE 1 TABLET BY MOUTH THREE TIMES A DAY AS NEEDED FOR MUSCLE SPASMS 60 tablet 3  . Diclofenac Sodium 1.5 % SOLN PLACE 0.3MLS ONTO THE SKIN 4 TIMES DAILY AS NEEDED 150 mL 3  . dicyclomine (BENTYL) 10 MG capsule TAKE 1 CAPSULE (10 MG TOTAL) BY MOUTH EVERY 8 (EIGHT) HOURS AS NEEDED FOR SPASMS. 60 capsule 2  . doxazosin (CARDURA) 4 MG tablet TAKE 1 TABLET BY MOUTH EVERY DAY 90 tablet 2  . fluticasone (FLONASE) 50 MCG/ACT nasal spray PLACE 1 SPRAY INTO BOTH NOSTRILS AS NEEDED. 16 g 11  . gabapentin (NEURONTIN) 100 MG capsule TAKE 1 CAPSULE BY MOUTH THREE TIMES A DAY AS NEEDED 90 capsule 3  . gabapentin (NEURONTIN) 300 MG capsule Take 1 capsule (300 mg total) by mouth 3 (three) times daily. 90 capsule 5  . methylPREDNISolone (MEDROL DOSEPAK) 4 MG TBPK tablet As directed 21 tablet 0  . omeprazole (PRILOSEC) 40 MG capsule TAKE 1 CAPSULE BY MOUTH EVERY DAY 90 capsule 3  . potassium chloride (KLOR-CON 10) 10 MEQ tablet Take 1 tablet (10 mEq total) by mouth daily. 30 tablet 5  . sucralfate (CARAFATE) 1 g tablet TAKE 1 TABLET BY MOUTH 3 TIMES A DAY BETWEEN MEALS 90 tablet 11  . telmisartan-hydrochlorothiazide (MICARDIS HCT) 80-25 MG tablet TAKE 1 TABLET BY MOUTH EVERY DAY 90 tablet 1  . valACYclovir (VALTREX) 500 MG tablet TAKE 1 TABLET (500 MG TOTAL) BY MOUTH 2 (TWO) TIMES DAILY. FOR 5 DAYS WITH OUTBREAK 30 tablet 1  . Vitamin D, Cholecalciferol, 1000 units CAPS Take 2,000 Units  by mouth.    . zolpidem (AMBIEN) 10 MG tablet TAKE 1 TABLET BY MOUTH PRIOR TO STUDY 1 tablet 0  . carvedilol (COREG) 12.5 MG tablet Take 1 tablet (12.5 mg total) by mouth 2 (two) times daily. 180 tablet 3   Facility-Administered Medications Prior to Visit  Medication Dose Route Frequency Provider Last Rate Last Dose  . 0.9 %  sodium chloride infusion  500 mL Intravenous Once Armbruster, Carlota Raspberry, MD        ROS: Review of Systems  Constitutional: Negative for activity change, appetite change, chills, fatigue and unexpected weight change.  HENT: Negative for congestion, mouth sores and sinus pressure.   Eyes: Negative for visual disturbance.  Respiratory: Negative for cough and chest tightness.   Gastrointestinal: Negative for abdominal pain and nausea.  Genitourinary: Negative for difficulty urinating, frequency and vaginal pain.  Musculoskeletal: Positive for arthralgias. Negative for back pain and gait problem.  Skin: Negative for pallor and rash.  Neurological: Negative for dizziness, tremors, weakness, numbness and headaches.  Psychiatric/Behavioral: Negative for confusion, sleep disturbance and suicidal ideas.    Objective:  BP 126/78 (BP Location: Left Arm, Patient Position: Sitting, Cuff Size: Large)   Pulse 81   Temp 98.6 F (37 C) (Oral)   Ht 5\' 4"  (1.626 m)   Wt 184 lb (83.5  kg)   SpO2 98%   BMI 31.58 kg/m   BP Readings from Last 3 Encounters:  01/23/19 126/78  01/11/19 137/87  10/31/18 136/84    Wt Readings from Last 3 Encounters:  01/23/19 184 lb (83.5 kg)  01/11/19 186 lb (84.4 kg)  10/31/18 183 lb (83 kg)    Physical Exam Constitutional:      General: She is not in acute distress.    Appearance: She is well-developed.  HENT:     Head: Normocephalic.     Right Ear: External ear normal.     Left Ear: External ear normal.     Nose: Nose normal.  Eyes:     General:        Right eye: No discharge.        Left eye: No discharge.      Conjunctiva/sclera: Conjunctivae normal.     Pupils: Pupils are equal, round, and reactive to light.  Neck:     Musculoskeletal: Normal range of motion and neck supple.     Thyroid: No thyromegaly.     Vascular: No JVD.     Trachea: No tracheal deviation.  Cardiovascular:     Rate and Rhythm: Normal rate and regular rhythm.     Heart sounds: Normal heart sounds.  Pulmonary:     Effort: No respiratory distress.     Breath sounds: No stridor. No wheezing.  Abdominal:     General: Bowel sounds are normal. There is no distension.     Palpations: Abdomen is soft. There is no mass.     Tenderness: There is no abdominal tenderness. There is no guarding or rebound.  Musculoskeletal:        General: No tenderness.  Lymphadenopathy:     Cervical: No cervical adenopathy.  Skin:    Findings: No erythema or rash.  Neurological:     Cranial Nerves: No cranial nerve deficit.     Motor: No abnormal muscle tone.     Coordination: Coordination normal.     Deep Tendon Reflexes: Reflexes normal.  Psychiatric:        Behavior: Behavior normal.        Thought Content: Thought content normal.        Judgment: Judgment normal.     Lab Results  Component Value Date   WBC 4.9 04/18/2018   HGB 13.3 04/18/2018   HCT 39.0 04/18/2018   PLT 336.0 04/18/2018   GLUCOSE 78 06/20/2018   CHOL 183 04/18/2018   TRIG 73.0 04/18/2018   HDL 36.20 (L) 04/18/2018   LDLCALC 132 (H) 04/18/2018   ALT 24 04/18/2018   AST 18 04/18/2018   NA 140 06/20/2018   K 4.3 06/20/2018   CL 97 06/20/2018   CREATININE 0.92 06/20/2018   BUN 11 06/20/2018   CO2 25 06/20/2018   TSH 0.71 04/18/2018    Ct Coronary Morph W/cta Cor W/score W/ca W/cm &/or Wo/cm  Addendum Date: 06/26/2018   ADDENDUM REPORT: 06/26/2018 21:49 CLINICAL DATA:  Chest pain EXAM: Cardiac CTA MEDICATIONS: Sub lingual nitro. 4mg  x 2 : The patient was scanned on a Siemens [id number] slice scanner. Gantry rotation speed was 250 msecs. Collimation was 0.6 mm. A  100 kV prospective scan was triggered in the ascending thoracic aorta at 35-75% of the R-R interval. Average HR during the scan was 60 bpm. The 3D data set was interpreted on a dedicated work station using MPR, MIP and VRT modes. A total of 80cc of contrast was used.  FINDINGS: Non-cardiac: See separate report from [facility]. Pulmonary veins drain normally to the left atrium. Calcium Score: 0 Agatston units. Coronary Arteries: Left dominant with no anomalies LM: Very short left main, essentially separate ostia for LAD and LCx. LAD system: No plaque or stenosis. Circumflex system: Large, dominant vessel with no plaque or stenosis. RCA system: Small, nondominant vessel.  No plaque or stenosis. IMPRESSION: 1. Coronary artery calcium score 0 Agatston units. This suggests low risk for future cardiac events. 2.  No significant coronary disease noted. Dalton Mclean Electronically Signed   By: Loralie Champagne M.D.   On: 06/26/2018 21:49   Result Date: 06/26/2018 EXAM: OVER-READ INTERPRETATION  CT CHEST The following report is an over-read performed by radiologist Dr. Rolm Baptise of [facility] on 06/26/2018. This over-read does not include interpretation of cardiac or coronary anatomy or pathology. The coronary CTA interpretation by the cardiologist is attached. COMPARISON:  None. FINDINGS: Vascular: Mild cardiomegaly.  Aorta is normal caliber. Mediastinum/Nodes: No adenopathy in the lower mediastinum or hila. Lungs/Pleura: Linear scarring in the lung bases.  No effusions. Upper Abdomen: Imaging into the upper abdomen shows no acute findings. Musculoskeletal: Chest wall soft tissues are unremarkable. No acute bony abnormality. IMPRESSION: Mild cardiomegaly.  Bibasilar scarring.  No active disease. Electronically Signed: By: Rolm Baptise M.D. On: 06/26/2018 12:26    Assessment & Plan:   There are no diagnoses linked to this encounter.   No orders of the defined types were placed in this  encounter.    Follow-up: No follow-ups on file.  Walker Kehr, MD

## 2019-01-23 NOTE — Assessment & Plan Note (Signed)
Loose wt 

## 2019-02-01 ENCOUNTER — Other Ambulatory Visit: Payer: Self-pay | Admitting: Internal Medicine

## 2019-04-22 ENCOUNTER — Other Ambulatory Visit: Payer: Self-pay | Admitting: Internal Medicine

## 2019-04-22 ENCOUNTER — Other Ambulatory Visit: Payer: Self-pay | Admitting: Cardiovascular Disease

## 2019-04-25 ENCOUNTER — Other Ambulatory Visit: Payer: Self-pay

## 2019-04-25 ENCOUNTER — Encounter: Payer: Self-pay | Admitting: Internal Medicine

## 2019-04-25 ENCOUNTER — Ambulatory Visit (INDEPENDENT_AMBULATORY_CARE_PROVIDER_SITE_OTHER): Admitting: Internal Medicine

## 2019-04-25 VITALS — BP 122/76 | HR 74 | Temp 98.5°F | Ht 64.0 in | Wt 179.0 lb

## 2019-04-25 DIAGNOSIS — M25511 Pain in right shoulder: Secondary | ICD-10-CM

## 2019-04-25 DIAGNOSIS — K760 Fatty (change of) liver, not elsewhere classified: Secondary | ICD-10-CM

## 2019-04-25 DIAGNOSIS — K219 Gastro-esophageal reflux disease without esophagitis: Secondary | ICD-10-CM | POA: Diagnosis not present

## 2019-04-25 DIAGNOSIS — E0590 Thyrotoxicosis, unspecified without thyrotoxic crisis or storm: Secondary | ICD-10-CM

## 2019-04-25 DIAGNOSIS — I10 Essential (primary) hypertension: Secondary | ICD-10-CM

## 2019-04-25 MED ORDER — GABAPENTIN 100 MG PO CAPS: ORAL_CAPSULE | ORAL | 3 refills | Status: DC

## 2019-04-25 MED ORDER — DICLOFENAC SODIUM 1.5 % TD SOLN: TRANSDERMAL | 3 refills | Status: DC

## 2019-04-25 MED ORDER — CYCLOBENZAPRINE HCL 5 MG PO TABS: 5.0000 mg | ORAL_TABLET | Freq: Three times a day (TID) | ORAL | 1 refills | Status: DC | PRN

## 2019-04-25 NOTE — Assessment & Plan Note (Signed)
Protonix.  ?

## 2019-04-25 NOTE — Assessment & Plan Note (Signed)
Worse Ref to Dr Durward Fortes

## 2019-04-25 NOTE — Assessment & Plan Note (Signed)
TSH 

## 2019-04-25 NOTE — Patient Instructions (Signed)

## 2019-04-25 NOTE — Assessment & Plan Note (Addendum)
See diet re

## 2019-04-25 NOTE — Assessment & Plan Note (Signed)
Amlodipine Micardis HCT 

## 2019-04-25 NOTE — Progress Notes (Signed)
Subjective:  Patient ID: Karina Newman, female    DOB: July 25, 1956  Age: 63 y.o. MRN: [id number]  CC: No chief complaint on file.   HPI [name] presents for HTN, R shoulder pain - worse, B12 def f/u  Outpatient Medications Prior to Visit  Medication Sig Dispense Refill  . amLODipine (NORVASC) 5 MG tablet Take 1 tablet (5 mg total) by mouth daily. 90 tablet 3  . carvedilol (COREG) 12.5 MG tablet TAKE 1 TABLET (12.5 MG TOTAL) BY MOUTH 2 (TWO) TIMES DAILY. 180 tablet 0  . Cyanocobalamin (VITAMIN B-12) 500 MCG SUBL 1 sl qd 100 tablet 5  . cyclobenzaprine (FLEXERIL) 10 MG tablet Take 1 tablet (10 mg total) by mouth 3 (three) times daily as needed for muscle spasms. 90 tablet 3  . Diclofenac Sodium 1.5 % SOLN PLACE 0.3MLS ONTO THE SKIN 4 TIMES DAILY AS NEEDED 150 mL 3  . dicyclomine (BENTYL) 10 MG capsule TAKE 1 CAPSULE (10 MG TOTAL) BY MOUTH EVERY 8 (EIGHT) HOURS AS NEEDED FOR SPASMS. 60 capsule 2  . doxazosin (CARDURA) 4 MG tablet TAKE 1 TABLET BY MOUTH EVERY DAY 90 tablet 2  . fluticasone (FLONASE) 50 MCG/ACT nasal spray PLACE 1 SPRAY INTO BOTH NOSTRILS AS NEEDED. 16 g 11  . gabapentin (NEURONTIN) 100 MG capsule TAKE 1 CAPSULE BY MOUTH THREE TIMES A DAY AS NEEDED 90 capsule 3  . gabapentin (NEURONTIN) 300 MG capsule Take 1 capsule (300 mg total) by mouth 3 (three) times daily. 90 capsule 5  . methylPREDNISolone (MEDROL DOSEPAK) 4 MG TBPK tablet As directed 21 tablet 0  . omeprazole (PRILOSEC) 40 MG capsule TAKE 1 CAPSULE BY MOUTH EVERY DAY 90 capsule 3  . potassium chloride (K-DUR) 10 MEQ tablet TAKE 1 TABLET BY MOUTH EVERY DAY 30 tablet 5  . sucralfate (CARAFATE) 1 g tablet TAKE 1 TABLET BY MOUTH 3 TIMES A DAY BETWEEN MEALS 90 tablet 11  . telmisartan-hydrochlorothiazide (MICARDIS HCT) 80-25 MG tablet TAKE 1 TABLET BY MOUTH EVERY DAY 90 tablet 1  . valACYclovir (VALTREX) 500 MG tablet TAKE 1 TABLET (500 MG TOTAL) BY MOUTH 2 (TWO) TIMES DAILY. FOR 5 DAYS WITH OUTBREAK 30 tablet 1  .  Vitamin D, Cholecalciferol, 1000 units CAPS Take 2,000 Units by mouth.    . zolpidem (AMBIEN) 10 MG tablet TAKE 1 TABLET BY MOUTH PRIOR TO STUDY 1 tablet 0   Facility-Administered Medications Prior to Visit  Medication Dose Route Frequency Provider Last Rate Last Dose  . 0.9 %  sodium chloride infusion  500 mL Intravenous Once Armbruster, Carlota Raspberry, MD        ROS: Review of Systems  Constitutional: Positive for fatigue. Negative for activity change, appetite change, chills and unexpected weight change.  HENT: Negative for congestion, mouth sores and sinus pressure.   Eyes: Negative for visual disturbance.  Respiratory: Negative for cough and chest tightness.   Gastrointestinal: Negative for abdominal pain and nausea.  Genitourinary: Negative for difficulty urinating, frequency and vaginal pain.  Musculoskeletal: Positive for arthralgias. Negative for back pain and gait problem.  Skin: Negative for pallor and rash.  Neurological: Negative for dizziness, tremors, weakness, numbness and headaches.  Psychiatric/Behavioral: Negative for confusion, sleep disturbance and suicidal ideas.    Objective:  BP 122/76 (BP Location: Left Arm, Patient Position: Sitting, Cuff Size: Large)   Pulse 74   Temp 98.5 F (36.9 C) (Oral)   Ht 5\' 4"  (1.626 m)   Wt 179 lb (81.2 kg)   SpO2 99%  BMI 30.73 kg/m   BP Readings from Last 3 Encounters:  04/25/19 122/76  01/23/19 126/78  01/11/19 137/87    Wt Readings from Last 3 Encounters:  04/25/19 179 lb (81.2 kg)  01/23/19 184 lb (83.5 kg)  01/11/19 186 lb (84.4 kg)    Physical Exam Constitutional:      General: She is not in acute distress.    Appearance: She is well-developed.  HENT:     Head: Normocephalic.     Right Ear: External ear normal.     Left Ear: External ear normal.     Nose: Nose normal.  Eyes:     General:        Right eye: No discharge.        Left eye: No discharge.     Conjunctiva/sclera: Conjunctivae normal.      Pupils: Pupils are equal, round, and reactive to light.  Neck:     Musculoskeletal: Normal range of motion and neck supple.     Thyroid: No thyromegaly.     Vascular: No JVD.     Trachea: No tracheal deviation.  Cardiovascular:     Rate and Rhythm: Normal rate and regular rhythm.     Heart sounds: Normal heart sounds.  Pulmonary:     Effort: No respiratory distress.     Breath sounds: No stridor. No wheezing.  Abdominal:     General: Bowel sounds are normal. There is no distension.     Palpations: Abdomen is soft. There is no mass.     Tenderness: There is no abdominal tenderness. There is no guarding or rebound.  Musculoskeletal:        General: Tenderness present.  Lymphadenopathy:     Cervical: No cervical adenopathy.  Skin:    Findings: No erythema or rash.  Neurological:     Cranial Nerves: No cranial nerve deficit.     Motor: No abnormal muscle tone.     Coordination: Coordination normal.     Deep Tendon Reflexes: Reflexes normal.  Psychiatric:        Behavior: Behavior normal.        Thought Content: Thought content normal.        Judgment: Judgment normal.    R shoulder w/pain on ROM   Lab Results  Component Value Date   WBC 4.9 04/18/2018   HGB 13.3 04/18/2018   HCT 39.0 04/18/2018   PLT 336.0 04/18/2018   GLUCOSE 105 (H) 01/23/2019   CHOL 183 04/18/2018   TRIG 73.0 04/18/2018   HDL 36.20 (L) 04/18/2018   LDLCALC 132 (H) 04/18/2018   ALT 24 04/18/2018   AST 18 04/18/2018   NA 138 01/23/2019   K 3.4 (L) 01/23/2019   CL 100 01/23/2019   CREATININE 0.84 01/23/2019   BUN 11 01/23/2019   CO2 30 01/23/2019   TSH 0.71 04/18/2018    Ct Coronary Morph W/cta Cor W/score W/ca W/cm &/or Wo/cm  Addendum Date: 06/26/2018   ADDENDUM REPORT: 06/26/2018 21:49 CLINICAL DATA:  Chest pain EXAM: Cardiac CTA MEDICATIONS: Sub lingual nitro. 4mg  x 2 : The patient was scanned on a Siemens [id number] slice scanner. Gantry rotation speed was 250 msecs. Collimation was 0.6 mm. A  100 kV prospective scan was triggered in the ascending thoracic aorta at 35-75% of the R-R interval. Average HR during the scan was 60 bpm. The 3D data set was interpreted on a dedicated work station using MPR, MIP and VRT modes. A total of 80cc of contrast was  used. FINDINGS: Non-cardiac: See separate report from [facility]. Pulmonary veins drain normally to the left atrium. Calcium Score: 0 Agatston units. Coronary Arteries: Left dominant with no anomalies LM: Very short left main, essentially separate ostia for LAD and LCx. LAD system: No plaque or stenosis. Circumflex system: Large, dominant vessel with no plaque or stenosis. RCA system: Small, nondominant vessel.  No plaque or stenosis. IMPRESSION: 1. Coronary artery calcium score 0 Agatston units. This suggests low risk for future cardiac events. 2.  No significant coronary disease noted. Dalton Mclean Electronically Signed   By: Loralie Champagne M.D.   On: 06/26/2018 21:49   Result Date: 06/26/2018 EXAM: OVER-READ INTERPRETATION  CT CHEST The following report is an over-read performed by radiologist Dr. Rolm Baptise of [facility] on 06/26/2018. This over-read does not include interpretation of cardiac or coronary anatomy or pathology. The coronary CTA interpretation by the cardiologist is attached. COMPARISON:  None. FINDINGS: Vascular: Mild cardiomegaly.  Aorta is normal caliber. Mediastinum/Nodes: No adenopathy in the lower mediastinum or hila. Lungs/Pleura: Linear scarring in the lung bases.  No effusions. Upper Abdomen: Imaging into the upper abdomen shows no acute findings. Musculoskeletal: Chest wall soft tissues are unremarkable. No acute bony abnormality. IMPRESSION: Mild cardiomegaly.  Bibasilar scarring.  No active disease. Electronically Signed: By: Rolm Baptise M.D. On: 06/26/2018 12:26    Assessment & Plan:   There are no diagnoses linked to this encounter.   No orders of the defined types were placed in this  encounter.    Follow-up: No follow-ups on file.  Walker Kehr, MD

## 2019-05-15 ENCOUNTER — Encounter: Payer: Self-pay | Admitting: Gynecology

## 2019-05-26 ENCOUNTER — Other Ambulatory Visit: Payer: Self-pay | Admitting: Internal Medicine

## 2019-05-29 ENCOUNTER — Other Ambulatory Visit: Payer: Self-pay | Admitting: Internal Medicine

## 2019-05-29 ENCOUNTER — Telehealth: Payer: Self-pay | Admitting: *Deleted

## 2019-05-29 DIAGNOSIS — N644 Mastodynia: Secondary | ICD-10-CM

## 2019-05-29 DIAGNOSIS — Z1231 Encounter for screening mammogram for malignant neoplasm of breast: Secondary | ICD-10-CM

## 2019-05-29 NOTE — Telephone Encounter (Signed)
Copied from [place] [id number]. Topic: General - Other >> May 29, 2019 12:38 PM Carolyn Stare wrote: Pt req a referral to GI for diagnostic mamo and a sonogram for right breast pain

## 2019-05-30 NOTE — Telephone Encounter (Signed)
What is the problem? Thx

## 2019-05-31 NOTE — Telephone Encounter (Signed)
Right breast pain. 

## 2019-05-31 NOTE — Progress Notes (Signed)
Subjective:    Patient ID: Karina Newman, female    DOB: 10-Oct-1955, 63 y.o.   MRN: [id number]  HPI The patient is here for an acute visit.  Right breast pain:  It started about 2-3 months ago.  The pain is the lateral, lower aspect and it feels like a sharp pin like pain. It is intermittent, but lingers.  The pain was become more frequent.  She denies nipple discharge and skin changes.  Her last mammogram was 2018.  She has a history of cysts in the left breast, but not in the right breast.   She has a history of breast cancer on her mom's side (maternal aunt).     Medications and allergies reviewed with patient and updated if appropriate.  Patient Active Problem List   Diagnosis Date Noted  . Fatty liver 01/23/2019  . Hand pain 09/28/2018  . Knee pain 09/28/2018  . Inappropriate sinus tachycardia 05/17/2018  . Apnea 05/17/2018  . Upper respiratory infection 09/23/2017  . Fatigue 09/12/2017  . Well adult exam 01/19/2017  . Abdominal pain 01/19/2017  . Headache 10/27/2016  . Acute pain of left shoulder 09/16/2016  . Left hip pain 09/16/2016  . Low vitamin B12 level 03/16/2016  . Piriformis syndrome of right side 02/04/2016  . IT band syndrome 02/04/2016  . RUQ abdominal pain 02/04/2016  . Low back pain radiating to right lower extremity 09/19/2015  . Allergic rhinitis 09/19/2015  . Atypical chest pain 04/07/2015  . GERD (gastroesophageal reflux disease) 04/07/2015  . Axillary adenitis 03/23/2015  . Migraine headache 03/23/2015  . Hives 01/29/2015  . Edema 01/29/2015  . Cervical disc disorder with radiculopathy of cervical region 12/11/2014  . Insomnia 07/31/2014  . Degenerative cervical disc 05/08/2014  . Pain in joint, shoulder region 03/05/2014  . Patellofemoral arthralgia of both knees 03/01/2014  . Bilateral shoulder pain 10/18/2013  . Sinusitis, bacterial 08/20/2013  . URI, acute 08/14/2013  . Hemoptysis 08/14/2013  . Otitis media of left ear 08/14/2013  .  Labral tear of shoulder 07/03/2013  . Hyperthyroidism 07/03/2013  . HTN (hypertension), benign 07/03/2013  . LVH (left ventricular hypertrophy) due to hypertensive disease 07/03/2013  . Vitamin D deficiency 07/03/2013    Current Outpatient Medications on File Prior to Visit  Medication Sig Dispense Refill  . amLODipine (NORVASC) 5 MG tablet Take 1 tablet (5 mg total) by mouth daily. 90 tablet 3  . carvedilol (COREG) 12.5 MG tablet TAKE 1 TABLET (12.5 MG TOTAL) BY MOUTH 2 (TWO) TIMES DAILY. 180 tablet 0  . Cyanocobalamin (VITAMIN B-12) 500 MCG SUBL 1 sl qd 100 tablet 5  . cyclobenzaprine (FLEXERIL) 5 MG tablet Take 1 tablet (5 mg total) by mouth 3 (three) times daily as needed for muscle spasms. 30 tablet 1  . Diclofenac Sodium 1.5 % SOLN PLACE 0.3MLS ONTO THE SKIN 4 TIMES DAILY AS NEEDED 150 mL 3  . dicyclomine (BENTYL) 10 MG capsule TAKE 1 CAPSULE (10 MG TOTAL) BY MOUTH EVERY 8 (EIGHT) HOURS AS NEEDED FOR SPASMS. 60 capsule 2  . doxazosin (CARDURA) 4 MG tablet TAKE 1 TABLET BY MOUTH EVERY DAY 90 tablet 2  . fluticasone (FLONASE) 50 MCG/ACT nasal spray PLACE 1 SPRAY INTO BOTH NOSTRILS AS NEEDED. 16 g 11  . gabapentin (NEURONTIN) 100 MG capsule TAKE 1 CAPSULE BY MOUTH THREE TIMES A DAY AS NEEDED 90 capsule 3  . gabapentin (NEURONTIN) 300 MG capsule Take 1 capsule (300 mg total) by mouth 3 (three) times daily.  90 capsule 5  . methylPREDNISolone (MEDROL DOSEPAK) 4 MG TBPK tablet As directed 21 tablet 0  . omeprazole (PRILOSEC) 40 MG capsule TAKE 1 CAPSULE BY MOUTH EVERY DAY 90 capsule 3  . potassium chloride (K-DUR) 10 MEQ tablet TAKE 1 TABLET BY MOUTH EVERY DAY 30 tablet 5  . sucralfate (CARAFATE) 1 g tablet TAKE 1 TABLET BY MOUTH 3 TIMES A DAY BETWEEN MEALS 90 tablet 11  . telmisartan-hydrochlorothiazide (MICARDIS HCT) 80-25 MG tablet TAKE 1 TABLET BY MOUTH EVERY DAY 90 tablet 1  . valACYclovir (VALTREX) 500 MG tablet TAKE 1 TABLET (500 MG TOTAL) BY MOUTH 2 (TWO) TIMES DAILY. FOR 5 DAYS WITH  OUTBREAK 30 tablet 1  . Vitamin D, Cholecalciferol, 1000 units CAPS Take 2,000 Units by mouth.    . zolpidem (AMBIEN) 10 MG tablet TAKE 1 TABLET BY MOUTH PRIOR TO STUDY 1 tablet 0   Current Facility-Administered Medications on File Prior to Visit  Medication Dose Route Frequency Provider Last Rate Last Dose  . 0.9 %  sodium chloride infusion  500 mL Intravenous Once Armbruster, Carlota Raspberry, MD        Past Medical History:  Diagnosis Date  . Abnormal cervical Pap smear with positive HPV DNA test 01/2014,01/2017   2015 Normal cytology with positive high-risk HPV 18/45. Colposcopy negative with negative ECC.  2018 normal cytology with positive high-risk HPV 18/45  . Anemia   . Apnea 05/17/2018  . Autoimmune gastritis   . Fatty liver   . Gastritis   . Heart murmur   . Hypertension   . Hyperthyroidism   . Inappropriate sinus tachycardia 05/17/2018  . Internal hemorrhoids   . Right shoulder pain 2001   chronic pain  . Thyroid disease    Hyperthyroid. Was on PTU (Dr Hampton Abbot in [place]) - stopped in 2013, stable  . Tubular adenoma of colon     Past Surgical History:  Procedure Laterality Date  . BREAST CYST ASPIRATION Left 2012  . CESAREAN SECTION     x 4   . HAND SURGERY Left   . SHOULDER SURGERY Right 2002  . SHOULDER SURGERY Left 2014  . TUBAL LIGATION      Social History   Socioeconomic History  . Marital status: Married    Spouse name: Not on file  . Number of children: 4  . Years of education: Not on file  . Highest education level: Not on file  Occupational History  . Occupation: RETIRED  Social Needs  . Financial resource strain: Not on file  . Food insecurity    Worry: Not on file    Inability: Not on file  . Transportation needs    Medical: Not on file    Non-medical: Not on file  Tobacco Use  . Smoking status: Never Smoker  . Smokeless tobacco: Never Used  Substance and Sexual Activity  . Alcohol use: No  . Drug use: No  . Sexual activity: Yes    Birth  control/protection: Post-menopausal, Surgical    Comment: BTL-1st intercourse 41 yo-5 partners  Lifestyle  . Physical activity    Days per week: Not on file    Minutes per session: Not on file  . Stress: Not on file  Relationships  . Social Herbalist on phone: Not on file    Gets together: Not on file    Attends religious service: Not on file    Active member of club or organization: Not on file  Attends meetings of clubs or organizations: Not on file    Relationship status: Not on file  Other Topics Concern  . Not on file  Social History Narrative  . Not on file    Family History  Problem Relation Age of Onset  . Kidney disease Mother        ESRD  . Hypertension Mother   . Cancer Father        lung ca  . Hypertension Sister   . Diabetes Brother   . Hypertension Brother   . Diabetes Paternal Grandmother   . Hypertension Sister   . Hypertension Brother   . Sleep apnea Son   . Hypertension Son   . Colon cancer Neg Hx     Review of Systems Per hpi    Objective:   Vitals:   06/01/19 1444  BP: 122/80  Pulse: 92  Temp: 99.1 F (37.3 C)  SpO2: 98%   BP Readings from Last 3 Encounters:  06/01/19 122/80  04/25/19 122/76  01/23/19 126/78   Wt Readings from Last 3 Encounters:  06/01/19 180 lb (81.6 kg)  04/25/19 179 lb (81.2 kg)  01/23/19 184 lb (83.5 kg)   Body mass index is 30.9 kg/m.   Physical Exam Constitutional:      General: She is not in acute distress.    Appearance: Normal appearance. She is not ill-appearing.  Chest:     Breasts:        Right: Tenderness (9 o'clock from areola to lateral breast - palpable tender breast tissue) present. No swelling, nipple discharge or skin change.        Left: Normal. No swelling, nipple discharge, skin change or tenderness.  Lymphadenopathy:     Upper Body:     Right upper body: No supraclavicular, axillary or pectoral adenopathy.     Left upper body: No supraclavicular, axillary or pectoral  adenopathy.  Skin:    General: Skin is warm and dry.     Findings: No erythema or rash.  Neurological:     Mental Status: She is alert.            Assessment & Plan:    See Problem List for Assessment and Plan of chronic medical problems.

## 2019-06-01 ENCOUNTER — Other Ambulatory Visit: Payer: Self-pay

## 2019-06-01 ENCOUNTER — Ambulatory Visit (INDEPENDENT_AMBULATORY_CARE_PROVIDER_SITE_OTHER): Payer: Federal, State, Local not specified - PPO | Admitting: Internal Medicine

## 2019-06-01 ENCOUNTER — Encounter: Payer: Self-pay | Admitting: Internal Medicine

## 2019-06-01 VITALS — BP 122/80 | HR 92 | Temp 99.1°F | Ht 64.0 in | Wt 180.0 lb

## 2019-06-01 DIAGNOSIS — N644 Mastodynia: Secondary | ICD-10-CM | POA: Diagnosis not present

## 2019-06-01 MED ORDER — CLINDAMYCIN PHOSPHATE 1 % EX GEL: Freq: Two times a day (BID) | CUTANEOUS | 0 refills | Status: DC

## 2019-06-01 NOTE — Patient Instructions (Addendum)
A diagnostic mammogram and ultrasound was ordered.    Try the topical gel for your face.

## 2019-06-03 DIAGNOSIS — N644 Mastodynia: Secondary | ICD-10-CM | POA: Insufficient documentation

## 2019-06-03 NOTE — Assessment & Plan Note (Signed)
Tenderness right lateral breast 9 o'clock Last mammogram 2018 Diagnostic mammo ordered, R breast US ordered

## 2019-06-08 ENCOUNTER — Ambulatory Visit
Admission: RE | Admit: 2019-06-08 | Discharge: 2019-06-08 | Disposition: A | Discharge status: Home / Self Care | Payer: Federal, State, Local not specified - PPO | Source: Ambulatory Visit | Attending: Internal Medicine | Admitting: Internal Medicine

## 2019-06-08 ENCOUNTER — Other Ambulatory Visit: Payer: Self-pay

## 2019-06-08 DIAGNOSIS — N644 Mastodynia: Secondary | ICD-10-CM

## 2019-06-11 NOTE — Telephone Encounter (Signed)
Ok Thx 

## 2019-06-12 ENCOUNTER — Other Ambulatory Visit: Payer: Self-pay | Admitting: Internal Medicine

## 2019-06-12 DIAGNOSIS — N644 Mastodynia: Secondary | ICD-10-CM

## 2019-06-12 NOTE — Telephone Encounter (Signed)
See 06/08/19 mammogram and breast u/s ordered by Dr. Quay Burow in PCP's absence.

## 2019-06-21 NOTE — Progress Notes (Signed)
Office Visit Note  Patient: Karina Newman             Date of Birth: 1956/04/07           MRN: [id number]             PCP: Cassandria Anger, MD Referring: Cassandria Anger, MD Visit Date: 07/05/2019 Occupation: @GUAROCC @  Subjective:  Pain in multiple joints   History of Present Illness: Karina Newman is a 63 y.o. female with history of osteoarthritis and DDD.  She presents today with pain in multiple joints including the left shoulder joint, both hands, left knee joint, and left ankle joint.  She denies any joint swelling at this time.  She states that she has been having pain in the left shoulder joint when she is lying on her left side which is caused interrupted sleep at night.  She continues to have chronic neck pain and stiffness.  She denies any symptoms of radiculopathy.  She has been taking aspirin for pain relief.  She has not found gabapentin to be effective and has caused confusion.  She is unsure if she would like a cortisone injection.   Activities of Daily Living:  Patient reports joint stiffness all day  Patient Reports nocturnal pain.  Difficulty dressing/grooming: Denies Difficulty climbing stairs: Reports Difficulty getting out of chair: Denies Difficulty using hands for taps, buttons, cutlery, and/or writing: Reports  Review of Systems  Constitutional: Positive for fatigue.  HENT: Positive for nose dryness. Negative for mouth sores and mouth dryness.   Eyes: Positive for dryness.  Respiratory: Negative for shortness of breath and difficulty breathing.   Cardiovascular: Negative for palpitations and irregular heartbeat.  Gastrointestinal: Negative for abdominal pain, blood in stool, constipation and diarrhea.  Endocrine: Negative for increased urination.  Genitourinary: Negative for difficulty urinating.  Musculoskeletal: Positive for arthralgias, joint pain, joint swelling and morning stiffness. Negative for muscle tenderness.  Skin: Negative for rash.   Allergic/Immunologic: Negative for susceptible to infections.  Neurological: Positive for light-headedness and headaches. Negative for dizziness, numbness, memory loss and weakness.  Hematological: Positive for bruising/bleeding tendency.  Psychiatric/Behavioral: Positive for sleep disturbance. Negative for confusion.    PMFS History:  Patient Active Problem List   Diagnosis Date Noted   Pain of right breast 06/03/2019   Fatty liver 01/23/2019   Hand pain 09/28/2018   Knee pain 09/28/2018   Inappropriate sinus tachycardia 05/17/2018   Apnea 05/17/2018   Upper respiratory infection 09/23/2017   Fatigue 09/12/2017   Well adult exam 01/19/2017   Abdominal pain 01/19/2017   Headache 10/27/2016   Pain in right shoulder 09/16/2016   Left hip pain 09/16/2016   Low vitamin B12 level 03/16/2016   Piriformis syndrome of right side 02/04/2016   IT band syndrome 02/04/2016   RUQ abdominal pain 02/04/2016   Low back pain radiating to right lower extremity 09/19/2015   Allergic rhinitis 09/19/2015   Atypical chest pain 04/07/2015   GERD (gastroesophageal reflux disease) 04/07/2015   Axillary adenitis 03/23/2015   Migraine headache 03/23/2015   Hives 01/29/2015   Edema 01/29/2015   Cervical disc disorder with radiculopathy of cervical region 12/11/2014   Insomnia 07/31/2014   Degenerative cervical disc 05/08/2014   Pain in joint, shoulder region 03/05/2014   Patellofemoral arthralgia of both knees 03/01/2014   Bilateral shoulder pain 10/18/2013   Sinusitis, bacterial 08/20/2013   URI, acute 08/14/2013   Hemoptysis 08/14/2013   Otitis media of left ear 08/14/2013   Labral tear  of shoulder 07/03/2013   Hyperthyroidism 07/03/2013   HTN (hypertension), benign 07/03/2013   LVH (left ventricular hypertrophy) due to hypertensive disease 07/03/2013   Vitamin D deficiency 07/03/2013    Past Medical History:  Diagnosis Date   Abnormal cervical Pap  smear with positive HPV DNA test 01/2014,01/2017   2015 Normal cytology with positive high-risk HPV 18/45. Colposcopy negative with negative ECC.  2018 normal cytology with positive high-risk HPV 18/45   Anemia    Apnea 05/17/2018   Autoimmune gastritis    Fatty liver    Gastritis    Heart murmur    Hypertension    Hyperthyroidism    Inappropriate sinus tachycardia 05/17/2018   Internal hemorrhoids    Right shoulder pain 2001   chronic pain   Thyroid disease    Hyperthyroid. Was on PTU (Dr Hampton Abbot in [place]) - stopped in 2013, stable   Tubular adenoma of colon     Family History  Problem Relation Age of Onset   Kidney disease Mother        ESRD   Hypertension Mother    Cancer Father        lung ca   Hypertension Sister    Diabetes Brother    Hypertension Brother    Diabetes Paternal Grandmother    Hypertension Sister    Hypertension Brother    Sleep apnea Son    Hypertension Son    Colon cancer Neg Hx    Past Surgical History:  Procedure Laterality Date   BREAST CYST ASPIRATION Left 2012   CESAREAN SECTION     x 4    HAND SURGERY Left    SHOULDER SURGERY Right 2002   SHOULDER SURGERY Left 2014   TUBAL LIGATION     Social History   Social History Narrative   Not on file   Immunization History  Administered Date(s) Administered   Zoster Recombinat (Shingrix) 03/25/2017, 06/27/2017     Objective: Vital Signs: BP 112/74 (BP Location: Left Arm, Patient Position: Sitting, Cuff Size: Normal)    Pulse 85    Resp 14    Ht 5\' 4"  (1.626 m)    Wt 181 lb 12.8 oz (82.5 kg)    BMI 31.21 kg/m    Physical Exam Vitals signs and nursing note reviewed.  Constitutional:      Appearance: She is well-developed.  HENT:     Head: Normocephalic and atraumatic.  Eyes:     Conjunctiva/sclera: Conjunctivae normal.  Neck:     Musculoskeletal: Normal range of motion.  Cardiovascular:     Rate and Rhythm: Normal rate and regular rhythm.     Heart sounds:  Normal heart sounds.  Pulmonary:     Effort: Pulmonary effort is normal.     Breath sounds: Normal breath sounds.  Abdominal:     General: Bowel sounds are normal.     Palpations: Abdomen is soft.  Lymphadenopathy:     Cervical: No cervical adenopathy.  Skin:    General: Skin is warm and dry.     Capillary Refill: Capillary refill takes less than 2 seconds.  Neurological:     Mental Status: She is alert and oriented to person, place, and time.  Psychiatric:        Behavior: Behavior normal.      Musculoskeletal Exam: C-spine slightly limited range of motion with lateral rotation.  Thoracic and lumbar spine good range of motion.  No midline spinal tenderness.  Right shoulder has slightly limited range of  motion with discomfort.  Left shoulder has good range of motion.  Elbow joints, wrist joints, MCPs, PIPs, DIPs good range of motion no synovitis.  He has PIP and DIP synovial thickening consistent with osteoarthritis of both hands.  Hip joints have good range of motion no discomfort.  Knee joints have good range of motion no warmth or effusion.  No tenderness or swelling of ankle joints.  CDAI Exam: CDAI Score: -- Patient Global: --; Provider Global: -- Swollen: --; Tender: -- Joint Exam   No joint exam has been documented for this visit   There is currently no information documented on the homunculus. Go to the Rheumatology activity and complete the homunculus joint exam.  Investigation: No additional findings.  Imaging: US Breast Ltd Uni Right Inc Axilla  Result Date: 06/08/2019 CLINICAL DATA:  Patient describes focal pain within the outer RIGHT breast. History of bilateral breast cysts. EXAM: DIGITAL DIAGNOSTIC BILATERAL MAMMOGRAM WITH CAD AND TOMO ULTRASOUND RIGHT BREAST COMPARISON:  Previous exam(s). ACR Breast Density Category c: The breast tissue is heterogeneously dense, which may obscure small masses. FINDINGS: There is an oval circumscribed mass within the outer RIGHT  breast, corresponding to the area of clinical concern, measuring approximately 6 mm greatest dimension. There are no new dominant masses, suspicious calcifications or secondary signs of malignancy elsewhere within the RIGHT breast. There are no new dominant masses, suspicious calcifications or secondary signs of malignancy elsewhere within the LEFT breast. Mammographic images were processed with CAD. Targeted ultrasound is performed, evaluating the outer RIGHT breast with particular attention to the 9 o'clock axis as directed by the patient, showing a benign cyst at the 9 o'clock axis, 6 cm from the nipple, measuring 5 mm, corresponding to the mammographic finding. There is also a benign intramammary lymph node within the RIGHT breast at the 9 o'clock axis, in the vicinity of the area of clinical concern. No suspicious solid or cystic mass is identified within the outer RIGHT breast. IMPRESSION: No evidence of malignancy within either breast. Benign cyst within the RIGHT breast at the 9 o'clock axis, measuring 5 mm. RECOMMENDATION: 1.  Screening mammogram in one year.(Code:SM-B-01Y) 2. Benign causes of breast pain, and possible remedies, were discussed with the patient. Patient was encouraged to follow-up with referring physician if pain worsened or if a palpable lump/mass developed. I have discussed the findings and recommendations with the patient. If applicable, a reminder letter will be sent to the patient regarding the next appointment. BI-RADS CATEGORY  2: Benign. Electronically Signed   By: Franki Cabot M.D.   On: 06/08/2019 09:59   Mm Diag Breast Tomo Bilateral  Result Date: 06/08/2019 CLINICAL DATA:  Patient describes focal pain within the outer RIGHT breast. History of bilateral breast cysts. EXAM: DIGITAL DIAGNOSTIC BILATERAL MAMMOGRAM WITH CAD AND TOMO ULTRASOUND RIGHT BREAST COMPARISON:  Previous exam(s). ACR Breast Density Category c: The breast tissue is heterogeneously dense, which may obscure  small masses. FINDINGS: There is an oval circumscribed mass within the outer RIGHT breast, corresponding to the area of clinical concern, measuring approximately 6 mm greatest dimension. There are no new dominant masses, suspicious calcifications or secondary signs of malignancy elsewhere within the RIGHT breast. There are no new dominant masses, suspicious calcifications or secondary signs of malignancy elsewhere within the LEFT breast. Mammographic images were processed with CAD. Targeted ultrasound is performed, evaluating the outer RIGHT breast with particular attention to the 9 o'clock axis as directed by the patient, showing a benign cyst at the 9  o'clock axis, 6 cm from the nipple, measuring 5 mm, corresponding to the mammographic finding. There is also a benign intramammary lymph node within the RIGHT breast at the 9 o'clock axis, in the vicinity of the area of clinical concern. No suspicious solid or cystic mass is identified within the outer RIGHT breast. IMPRESSION: No evidence of malignancy within either breast. Benign cyst within the RIGHT breast at the 9 o'clock axis, measuring 5 mm. RECOMMENDATION: 1.  Screening mammogram in one year.(Code:SM-B-01Y) 2. Benign causes of breast pain, and possible remedies, were discussed with the patient. Patient was encouraged to follow-up with referring physician if pain worsened or if a palpable lump/mass developed. I have discussed the findings and recommendations with the patient. If applicable, a reminder letter will be sent to the patient regarding the next appointment. BI-RADS CATEGORY  2: Benign. Electronically Signed   By: Franki Cabot M.D.   On: 06/08/2019 09:59   Xr Shoulder Right  Result Date: 06/27/2019 Films of the right shoulder obtained in several projections.  The humeral head is centered in the glenoid.  Normal space between the humeral head and the acromion.  AC joint appears to be intact.  Flat acromion.  No ectopic calcification.  No evidence  of acute change   Recent Labs: Lab Results  Component Value Date   WBC 4.9 04/18/2018   HGB 13.3 04/18/2018   PLT 336.0 04/18/2018   NA 138 01/23/2019   K 3.4 (L) 01/23/2019   CL 100 01/23/2019   CO2 30 01/23/2019   GLUCOSE 105 (H) 01/23/2019   BUN 11 01/23/2019   CREATININE 0.84 01/23/2019   BILITOT 0.6 04/18/2018   ALKPHOS 80 04/18/2018   AST 18 04/18/2018   ALT 24 04/18/2018   PROT 8.0 04/18/2018   ALBUMIN 4.3 04/18/2018   CALCIUM 9.4 01/23/2019   GFRAA 78 06/20/2018    Speciality Comments: No specialty comments available.  Procedures:  No procedures performed Allergies: Penicillins, Gabapentin, Iodine, Lyrica [pregabalin], Nsaids, Tramadol, and Voltaren [diclofenac]   Assessment / Plan:     Visit Diagnoses: Primary osteoarthritis of both knees: She has moderate osteoarthritis and moderate chondromalacia patella bilaterally.  She has good range of motion of bilateral knee joints.  She has painful range of motion of the left knee.  No warmth or effusion was noted on exam.  She declined a left knee joint cortisone injection today.  She was advised to notify us if her symptoms persist or worsen and she can return for an injection.  She will follow up in 6 months.   Cervical disc disorder with radiculopathy of cervical region: She has good ROM with discomfort.  No symptoms of radiculopathy.  She has trapezius muscle tension and tenderness.   Trochanteric bursitis of left hip: She has no tenderness on exam today.   Other medica conditions are listed as follows:   HTN (hypertension), benign  Hypertensive left ventricular hypertrophy, without heart failure  History of gastroesophageal reflux (GERD)  Vitamin D deficiency  Fatty liver  Other insomnia  Hyperthyroidism  Tubular adenoma of colon  Orders: No orders of the defined types were placed in this encounter.  Meds ordered this encounter  Medications   DULoxetine (CYMBALTA) 30 MG capsule    Sig: Take 1  capsule (30 mg total) by mouth daily.    Dispense:  30 capsule    Refill:  3    .  Follow-Up Instructions: Return in about 4 weeks (around 08/02/2019) for Osteoarthritis, DDD.  Lovena Le  Lendon Ka  I examined and evaluated the patient with Hazel Sams PA.  Patient continues to have a lot of joint discomfort.  She states that Tylenol is not effective.  She also has been experiencing some neuropathy in her feet.  I did not find any synovitis on examination.  For the pain management we discussed possible use of Cymbalta.  Indications side effects contraindications were discussed.  She was placed on Cymbalta 30 mg p.o. daily.  The plan of care was discussed as noted above.  Bo Merino, MD  Note - This record has been created using Editor, commissioning.  Chart creation errors have been sought, but may not always  have been located. Such creation errors do not reflect on  the standard of medical care.

## 2019-06-27 ENCOUNTER — Encounter: Payer: Self-pay | Admitting: Orthopaedic Surgery

## 2019-06-27 ENCOUNTER — Other Ambulatory Visit: Payer: Self-pay

## 2019-06-27 ENCOUNTER — Ambulatory Visit (INDEPENDENT_AMBULATORY_CARE_PROVIDER_SITE_OTHER)

## 2019-06-27 ENCOUNTER — Ambulatory Visit (INDEPENDENT_AMBULATORY_CARE_PROVIDER_SITE_OTHER): Admitting: Orthopaedic Surgery

## 2019-06-27 VITALS — BP 144/83 | HR 81 | Ht 64.0 in | Wt 180.0 lb

## 2019-06-27 DIAGNOSIS — M25511 Pain in right shoulder: Secondary | ICD-10-CM

## 2019-06-27 DIAGNOSIS — G8929 Other chronic pain: Secondary | ICD-10-CM

## 2019-06-27 NOTE — Progress Notes (Signed)
Office Visit Note   Patient: Karina Newman           Date of Birth: November 30, 1955           MRN: [id number] Visit Date: 06/27/2019              Requested by: Karina Anger, MD [place],  [place] [id number] PCP: Karina Anger, MD   Assessment & Plan: Visit Diagnoses:  1. Chronic right shoulder pain     Plan: Chronic right shoulder pain dating back nearly 20 years ago when Karina Newman sustained an on-the-job injury while living in [place].  Need to obtain her old records.  Would suggest MRI scan of her right shoulder  Follow-Up Instructions: Return After MRI scan right shoulder.   Orders:  Orders Placed This Encounter  Procedures   XR Shoulder Right   MR SHOULDER RIGHT WO CONTRAST   No orders of the defined types were placed in this encounter.     Procedures: No procedures performed   Clinical Data: No additional findings.   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 She states that she had surgery in 2002 which did not go well.  She has given records to Karina Newman once she moved here from [place]. She continues to have shoulder pain all of the time with decreased range of motion. She is right hand dominate.  She has previous MRI shoulder on PACS from 2015.  She takes gabapentin and a muscle relaxer. This helps some.  She states that she was on Oxycodone and Morphine for a long time but she stopped when they affected her liver.  She was out of work from 2002-2005 and then went back. She was out of work 2008-2011.  She then retired in 2012.  After shoulder surgery, she was unable to move her neck up and down. She then had three epidurals. The first two helped.  Has been followed by Karina Newman her chronic pain control.  Presently not on any analgesics.  Has been on  disability since 2012.  She moved to the [place] region in 2015 and has not had any formal evaluation of her right shoulder pain.  She has seen Karina Newman on several occasions for problems not referable to her right shoulder.  MRI scan performed in 2015 is on the PACS system.  There was evidence of rotator cuff tendinopathy without a tear.  Negative for labral tear.  Status post acromioplasty and resection of the [facility] joint without complicating features. Continues to complain of difficulty with overhead motion and chronic pain surrounding her shoulder even radiating into her proximal arm.  Questionable history of carpal tunnel with some occasional tingling in her radial 3 digits right hand. We tried to obtain and review her old records from [place] but were not able to retrieve them and will continue to look at specifically I like to evaluate the operative report  HPI  Review of Systems   Objective: Vital Signs: BP (!) 144/83    Pulse 81    Ht 5\' 4"  (1.626 m)    Wt 180 lb (81.6 kg)    BMI 30.90 kg/m   Physical Exam Constitutional:      Appearance: She is well-developed.  Eyes:  Pupils: Pupils are equal, round, and reactive to light.  Pulmonary:     Effort: Pulmonary effort is normal.  Skin:    General: Skin is warm and dry.  Neurological:     Mental Status: She is alert and oriented to person, place, and time.  Psychiatric:        Behavior: Behavior normal.     Ortho Exam awake alert noted x3.  Comfortable sitting.  Positive impingement right shoulder with pain that I thought was out of proportion to what I would expect.  Biceps appears to be intact.  Skin intact.  Multiple areas of trigger point tenderness.  Good strength.  Good grip and release.  Minimal positive Tinel's over the median nerve at the right wrist I was able to passively place the arm just about fully overhead.  Little loss of internal rotation and might very well have very mild adhesive capsulitis  Specialty  Comments:  No specialty comments available.  Imaging: Xr Shoulder Right  Result Date: 06/27/2019 Films of the right shoulder obtained in several projections.  The humeral head is centered in the glenoid.  Normal space between the humeral head and the acromion.  AC joint appears to be intact.  Flat acromion.  No ectopic calcification.  No evidence of acute change    PMFS History: Patient Active Problem List   Diagnosis Date Noted   Pain of right breast 06/03/2019   Fatty liver 01/23/2019   Hand pain 09/28/2018   Knee pain 09/28/2018   Inappropriate sinus tachycardia 05/17/2018   Apnea 05/17/2018   Upper respiratory infection 09/23/2017   Fatigue 09/12/2017   Well adult exam 01/19/2017   Abdominal pain 01/19/2017   Headache 10/27/2016   Pain in right shoulder 09/16/2016   Left hip pain 09/16/2016   Low vitamin B12 level 03/16/2016   Piriformis syndrome of right side 02/04/2016   IT band syndrome 02/04/2016   RUQ abdominal pain 02/04/2016   Low back pain radiating to right lower extremity 09/19/2015   Allergic rhinitis 09/19/2015   Atypical chest pain 04/07/2015   GERD (gastroesophageal reflux disease) 04/07/2015   Axillary adenitis 03/23/2015   Migraine headache 03/23/2015   Hives 01/29/2015   Edema 01/29/2015   Cervical disc disorder with radiculopathy of cervical region 12/11/2014   Insomnia 07/31/2014   Degenerative cervical disc 05/08/2014   Pain in joint, shoulder region 03/05/2014   Patellofemoral arthralgia of both knees 03/01/2014   Bilateral shoulder pain 10/18/2013   Sinusitis, bacterial 08/20/2013   URI, acute 08/14/2013   Hemoptysis 08/14/2013   Otitis media of left ear 08/14/2013   Labral tear of shoulder 07/03/2013   Hyperthyroidism 07/03/2013   HTN (hypertension), benign 07/03/2013   LVH (left ventricular hypertrophy) due to hypertensive disease 07/03/2013   Vitamin D deficiency 07/03/2013   Past Medical  History:  Diagnosis Date   Abnormal cervical Pap smear with positive HPV DNA test 01/2014,01/2017   2015 Normal cytology with positive high-risk HPV 18/45. Colposcopy negative with negative ECC.  2018 normal cytology with positive high-risk HPV 18/45   Anemia    Apnea 05/17/2018   Autoimmune gastritis    Fatty liver    Gastritis    Heart murmur    Hypertension    Hyperthyroidism    Inappropriate sinus tachycardia 05/17/2018   Internal hemorrhoids    Right shoulder pain 2001   chronic pain   Thyroid disease    Hyperthyroid. Was on PTU (Dr Hampton Abbot in [place]) - stopped in 2013, stable  Tubular adenoma of colon     Family History  Problem Relation Age of Onset   Kidney disease Mother        ESRD   Hypertension Mother    Cancer Father        lung ca   Hypertension Sister    Diabetes Brother    Hypertension Brother    Diabetes Paternal Grandmother    Hypertension Sister    Hypertension Brother    Sleep apnea Son    Hypertension Son    Colon cancer Neg Hx     Past Surgical History:  Procedure Laterality Date   BREAST CYST ASPIRATION Left 2012   CESAREAN SECTION     x 4    HAND SURGERY Left    SHOULDER SURGERY Right 2002   SHOULDER SURGERY Left 2014   TUBAL LIGATION     Social History   Occupational History   Occupation: RETIRED  Tobacco Use   Smoking status: Never Smoker   Smokeless tobacco: Never Used  Substance and Sexual Activity   Alcohol use: No   Drug use: No   Sexual activity: Yes    Birth control/protection: Post-menopausal, Surgical    Comment: BTL-1st intercourse 16 yo-5 partners

## 2019-06-28 ENCOUNTER — Other Ambulatory Visit: Payer: Self-pay | Admitting: Internal Medicine

## 2019-07-05 ENCOUNTER — Telehealth: Payer: Self-pay | Admitting: *Deleted

## 2019-07-05 ENCOUNTER — Other Ambulatory Visit: Payer: Self-pay

## 2019-07-05 ENCOUNTER — Ambulatory Visit: Payer: Federal, State, Local not specified - PPO | Admitting: Rheumatology

## 2019-07-05 ENCOUNTER — Encounter: Payer: Self-pay | Admitting: Rheumatology

## 2019-07-05 VITALS — BP 112/74 | HR 85 | Resp 14 | Ht 64.0 in | Wt 181.8 lb

## 2019-07-05 DIAGNOSIS — K760 Fatty (change of) liver, not elsewhere classified: Secondary | ICD-10-CM

## 2019-07-05 DIAGNOSIS — I10 Essential (primary) hypertension: Secondary | ICD-10-CM

## 2019-07-05 DIAGNOSIS — M7062 Trochanteric bursitis, left hip: Secondary | ICD-10-CM

## 2019-07-05 DIAGNOSIS — M5010 Cervical disc disorder with radiculopathy, unspecified cervical region: Secondary | ICD-10-CM

## 2019-07-05 DIAGNOSIS — E559 Vitamin D deficiency, unspecified: Secondary | ICD-10-CM

## 2019-07-05 DIAGNOSIS — D126 Benign neoplasm of colon, unspecified: Secondary | ICD-10-CM

## 2019-07-05 DIAGNOSIS — Z8719 Personal history of other diseases of the digestive system: Secondary | ICD-10-CM

## 2019-07-05 DIAGNOSIS — I119 Hypertensive heart disease without heart failure: Secondary | ICD-10-CM

## 2019-07-05 DIAGNOSIS — G4709 Other insomnia: Secondary | ICD-10-CM

## 2019-07-05 DIAGNOSIS — M170 Bilateral primary osteoarthritis of knee: Secondary | ICD-10-CM

## 2019-07-05 DIAGNOSIS — E0590 Thyrotoxicosis, unspecified without thyrotoxic crisis or storm: Secondary | ICD-10-CM

## 2019-07-05 MED ORDER — DULOXETINE HCL 30 MG PO CPEP: 30.0000 mg | ORAL_CAPSULE | Freq: Every day | ORAL | 3 refills | Status: DC

## 2019-07-05 NOTE — Telephone Encounter (Signed)
Please schedule f/u appt 4-6 weeks. Patient started new medication. Thank you.

## 2019-07-05 NOTE — Progress Notes (Signed)
Pharmacy Note  Subjective:  Patient presents today to [facility] for follow up office visit.   Patient seen by the pharmacist for counseling on duloxetine (Cymbalta).    Objective: Vitals:   07/05/19 1443  BP: 112/74  Pulse: 85  Resp: 14    CMP     Component Value Date/Time   NA 138 01/23/2019 1429   NA 140 06/20/2018 1414   K 3.4 (L) 01/23/2019 1429   CL 100 01/23/2019 1429   CO2 30 01/23/2019 1429   GLUCOSE 105 (H) 01/23/2019 1429   BUN 11 01/23/2019 1429   BUN 11 06/20/2018 1414   CREATININE 0.84 01/23/2019 1429   CALCIUM 9.4 01/23/2019 1429   PROT 8.0 04/18/2018 1527   ALBUMIN 4.3 04/18/2018 1527   AST 18 04/18/2018 1527   ALT 24 04/18/2018 1527   ALKPHOS 80 04/18/2018 1527   BILITOT 0.6 04/18/2018 1527   GFRNONAA 67 06/20/2018 1414   GFRAA 78 06/20/2018 1414    Assessment/Plan:  Patient was prescribed duloxetine 30 mg mg daily.  Patient was counseled on the purpose, proper use, and adverse effects of duloxetine including nausea, constipation, dizziness, confusion, blurry vision, increased blood pressure, increased sweating, headache, and urinary retention.  Reviewed black boxed warning of increased risk of suicidal thoughts in young adults.  Provided patient with educational materials on duloxetine and answered all questions.    Advised patient not to stop abruptly and call our office if she is considering stopping the mediation.  Patient verbalized understanding.  All questions encouraged and answered. Instructed patient to call with any other questions or concerns.  Mariella Saa, PharmD, [place], [place] Clinical Specialty Pharmacist [phone]  07/05/2019 3:56 PM

## 2019-07-26 ENCOUNTER — Ambulatory Visit (INDEPENDENT_AMBULATORY_CARE_PROVIDER_SITE_OTHER): Payer: Federal, State, Local not specified - PPO | Admitting: Internal Medicine

## 2019-07-26 ENCOUNTER — Encounter: Payer: Self-pay | Admitting: Internal Medicine

## 2019-07-26 ENCOUNTER — Other Ambulatory Visit (INDEPENDENT_AMBULATORY_CARE_PROVIDER_SITE_OTHER): Payer: Federal, State, Local not specified - PPO

## 2019-07-26 ENCOUNTER — Other Ambulatory Visit: Payer: Self-pay

## 2019-07-26 DIAGNOSIS — M545 Low back pain: Secondary | ICD-10-CM | POA: Diagnosis not present

## 2019-07-26 DIAGNOSIS — E538 Deficiency of other specified B group vitamins: Secondary | ICD-10-CM

## 2019-07-26 DIAGNOSIS — M25511 Pain in right shoulder: Secondary | ICD-10-CM

## 2019-07-26 DIAGNOSIS — E559 Vitamin D deficiency, unspecified: Secondary | ICD-10-CM | POA: Diagnosis not present

## 2019-07-26 DIAGNOSIS — I10 Essential (primary) hypertension: Secondary | ICD-10-CM

## 2019-07-26 DIAGNOSIS — S43439S Superior glenoid labrum lesion of unspecified shoulder, sequela: Secondary | ICD-10-CM

## 2019-07-26 DIAGNOSIS — M79604 Pain in right leg: Secondary | ICD-10-CM

## 2019-07-26 LAB — LIPID PANEL
Cholesterol: 188 mg/dL (ref 0–200)
HDL: 40.9 mg/dL (ref >39.00)
LDL Cholesterol: 123 mg/dL — ABNORMAL HIGH (ref 0–99)
NonHDL: 146.62
Total CHOL/HDL Ratio: 5
Triglycerides: 120 mg/dL (ref 0.0–149.0)
VLDL: 24 mg/dL (ref 0.0–40.0)

## 2019-07-26 LAB — CBC WITH DIFFERENTIAL/PLATELET
Basophils Absolute: 0 10*3/uL (ref 0.0–0.1)
Basophils Relative: 0.8 % (ref 0.0–3.0)
Eosinophils Absolute: 0.1 10*3/uL (ref 0.0–0.7)
Eosinophils Relative: 1 % (ref 0.0–5.0)
HCT: 40.6 % (ref 36.0–46.0)
Hemoglobin: 13.8 g/dL (ref 12.0–15.0)
Lymphocytes Relative: 30.8 % (ref 12.0–46.0)
Lymphs Abs: 1.9 10*3/uL (ref 0.7–4.0)
MCHC: 33.9 g/dL (ref 30.0–36.0)
MCV: 94.5 fl (ref 78.0–100.0)
Monocytes Absolute: 0.4 10*3/uL (ref 0.1–1.0)
Monocytes Relative: 6.8 % (ref 3.0–12.0)
Neutro Abs: 3.8 10*3/uL (ref 1.4–7.7)
Neutrophils Relative %: 60.6 % (ref 43.0–77.0)
Platelets: 398 10*3/uL (ref 150.0–400.0)
RBC: 4.3 Mil/uL (ref 3.87–5.11)
RDW: 12.3 % (ref 11.5–15.5)
WBC: 6.2 10*3/uL (ref 4.0–10.5)

## 2019-07-26 LAB — URINALYSIS
Bilirubin Urine: NEGATIVE
Hgb urine dipstick: NEGATIVE
Ketones, ur: NEGATIVE
Leukocytes,Ua: NEGATIVE
Nitrite: NEGATIVE
Specific Gravity, Urine: 1.02 (ref 1.000–1.030)
Total Protein, Urine: NEGATIVE
Urine Glucose: NEGATIVE
Urobilinogen, UA: 1 (ref 0.0–1.0)
pH: 6.5 (ref 5.0–8.0)

## 2019-07-26 LAB — HEPATIC FUNCTION PANEL
ALT: 27 U/L (ref 0–35)
AST: 18 U/L (ref 0–37)
Albumin: 4.5 g/dL (ref 3.5–5.2)
Alkaline Phosphatase: 77 U/L (ref 39–117)
Bilirubin, Direct: 0.1 mg/dL (ref 0.0–0.3)
Total Bilirubin: 0.5 mg/dL (ref 0.2–1.2)
Total Protein: 8.4 g/dL — ABNORMAL HIGH (ref 6.0–8.3)

## 2019-07-26 LAB — BASIC METABOLIC PANEL
BUN: 14 mg/dL (ref 6–23)
CO2: 29 mEq/L (ref 19–32)
Calcium: 9.7 mg/dL (ref 8.4–10.5)
Chloride: 98 mEq/L (ref 96–112)
Creatinine, Ser: 0.84 mg/dL (ref 0.40–1.20)
GFR: 83.01 mL/min (ref >60.00)
Glucose, Bld: 105 mg/dL — ABNORMAL HIGH (ref 70–99)
Potassium: 4 mEq/L (ref 3.5–5.1)
Sodium: 137 mEq/L (ref 135–145)

## 2019-07-26 LAB — TSH: TSH: 0.39 u[IU]/mL (ref 0.35–4.50)

## 2019-07-26 MED ORDER — PENNSAID 2 % EX SOLN: 1.0000 "application " | Freq: Four times a day (QID) | CUTANEOUS | 5 refills | Status: DC | PRN

## 2019-07-26 NOTE — Assessment & Plan Note (Signed)
On B12 

## 2019-07-26 NOTE — Progress Notes (Signed)
Subjective:  Patient ID: Karina Newman, female    DOB: September 26, 1955  Age: 63 y.o. MRN: [id number]  CC: No chief complaint on file.   HPI Karina Newman presents for recurrent L ear pain, worse w/swallowing. C/o HAs. Karina Newman saw Dr Estanislado Pandy - started Duloxetine; now is c/o constipation F/u Vit D and B12 deficiency  Outpatient Medications Prior to Visit  Medication Sig Dispense Refill   amLODipine (NORVASC) 5 MG tablet Take 1 tablet (5 mg total) by mouth daily. 90 tablet 3   Aspirin-Caffeine (BAYER BACK & BODY PO) Take by mouth as needed.     clindamycin (CLINDAGEL) 1 % gel APPLY TO AFFECTED AREA TWICE A DAY (Patient taking differently: as needed. ) 30 g 0   Cyanocobalamin (VITAMIN B-12) 500 MCG SUBL 1 sl qd 100 tablet 5   cyclobenzaprine (FLEXERIL) 5 MG tablet Take 1 tablet (5 mg total) by mouth 3 (three) times daily as needed for muscle spasms. 30 tablet 1   Diclofenac Sodium 1.5 % SOLN PLACE 0.3MLS ONTO THE SKIN 4 TIMES DAILY AS NEEDED 150 mL 3   dicyclomine (BENTYL) 10 MG capsule TAKE 1 CAPSULE (10 MG TOTAL) BY MOUTH EVERY 8 (EIGHT) HOURS AS NEEDED FOR SPASMS. 60 capsule 2   doxazosin (CARDURA) 4 MG tablet TAKE 1 TABLET BY MOUTH EVERY DAY 90 tablet 2   DULoxetine (CYMBALTA) 30 MG capsule Take 1 capsule (30 mg total) by mouth daily. 30 capsule 3   fluticasone (FLONASE) 50 MCG/ACT nasal spray PLACE 1 SPRAY INTO BOTH NOSTRILS AS NEEDED. 16 g 11   gabapentin (NEURONTIN) 100 MG capsule TAKE 1 CAPSULE BY MOUTH THREE TIMES A DAY AS NEEDED 90 capsule 3   omeprazole (PRILOSEC) 40 MG capsule TAKE 1 CAPSULE BY MOUTH EVERY DAY 90 capsule 3   potassium chloride (K-DUR) 10 MEQ tablet TAKE 1 TABLET BY MOUTH EVERY DAY 30 tablet 5   sucralfate (CARAFATE) 1 g tablet TAKE 1 TABLET BY MOUTH 3 TIMES A DAY BETWEEN MEALS 90 tablet 11   telmisartan-hydrochlorothiazide (MICARDIS HCT) 80-25 MG tablet TAKE 1 TABLET BY MOUTH EVERY DAY 90 tablet 1   valACYclovir (VALTREX) 500 MG tablet TAKE 1 TABLET  (500 MG TOTAL) BY MOUTH 2 (TWO) TIMES DAILY. FOR 5 DAYS WITH OUTBREAK 30 tablet 1   Vitamin D, Cholecalciferol, 1000 units CAPS Take 2,000 Units by mouth.     carvedilol (COREG) 12.5 MG tablet TAKE 1 TABLET (12.5 MG TOTAL) BY MOUTH 2 (TWO) TIMES DAILY. 180 tablet 0   No facility-administered medications prior to visit.    ROS: Review of Systems  Constitutional: Positive for fatigue. Negative for activity change, appetite change, chills and unexpected weight change.  HENT: Negative for congestion, mouth sores and sinus pressure.   Eyes: Negative for visual disturbance.  Respiratory: Negative for cough and chest tightness.   Gastrointestinal: Positive for constipation. Negative for abdominal pain and nausea.  Genitourinary: Negative for difficulty urinating, frequency and vaginal pain.  Musculoskeletal: Positive for arthralgias, back pain, gait problem, myalgias and neck stiffness.  Skin: Negative for pallor and rash.  Neurological: Positive for dizziness, light-headedness and headaches. Negative for tremors, weakness and numbness.  Psychiatric/Behavioral: Negative for confusion, decreased concentration and sleep disturbance.    Objective:  BP 128/82 (BP Location: Left Arm, Patient Position: Sitting, Cuff Size: Normal)    Pulse 76    Temp 98 F (36.7 C) (Oral)    Ht 5\' 4"  (1.626 m)    Wt 178 lb (80.7 kg)  SpO2 96%    BMI 30.55 kg/m   BP Readings from Last 3 Encounters:  07/26/19 128/82  07/05/19 112/74  06/27/19 (!) 144/83    Wt Readings from Last 3 Encounters:  07/26/19 178 lb (80.7 kg)  07/05/19 181 lb 12.8 oz (82.5 kg)  06/27/19 180 lb (81.6 kg)    Physical Exam Constitutional:      General: She is not in acute distress.    Appearance: She is well-developed.  HENT:     Head: Normocephalic.     Right Ear: External ear normal.     Left Ear: External ear normal.     Nose: Nose normal.  Eyes:     General:        Right eye: No discharge.        Left eye: No  discharge.     Conjunctiva/sclera: Conjunctivae normal.     Pupils: Pupils are equal, round, and reactive to light.  Neck:     Thyroid: No thyromegaly.     Vascular: No JVD.     Trachea: No tracheal deviation.  Cardiovascular:     Rate and Rhythm: Normal rate and regular rhythm.     Heart sounds: Normal heart sounds.  Pulmonary:     Effort: No respiratory distress.     Breath sounds: No stridor. No wheezing.  Abdominal:     General: Bowel sounds are normal. There is no distension.     Palpations: Abdomen is soft. There is no mass.     Tenderness: There is no abdominal tenderness. There is no guarding or rebound.  Musculoskeletal:        General: No tenderness.     Cervical back: Normal range of motion and neck supple.  Lymphadenopathy:     Cervical: No cervical adenopathy.  Skin:    Findings: No erythema or rash.  Neurological:     Mental Status: She is oriented to person, place, and time.     Cranial Nerves: No cranial nerve deficit.     Motor: No abnormal muscle tone.     Coordination: Coordination normal.     Deep Tendon Reflexes: Reflexes normal.  Psychiatric:        Behavior: Behavior normal.        Thought Content: Thought content normal.        Judgment: Judgment normal.     Lab Results  Component Value Date   WBC 4.9 04/18/2018   HGB 13.3 04/18/2018   HCT 39.0 04/18/2018   PLT 336.0 04/18/2018   GLUCOSE 105 (H) 01/23/2019   CHOL 183 04/18/2018   TRIG 73.0 04/18/2018   HDL 36.20 (L) 04/18/2018   LDLCALC 132 (H) 04/18/2018   ALT 24 04/18/2018   AST 18 04/18/2018   NA 138 01/23/2019   K 3.4 (L) 01/23/2019   CL 100 01/23/2019   CREATININE 0.84 01/23/2019   BUN 11 01/23/2019   CO2 30 01/23/2019   TSH 0.71 04/18/2018    US BREAST LTD UNI RIGHT INC AXILLA  Result Date: 06/08/2019 CLINICAL DATA:  Patient describes focal pain within the outer RIGHT breast. History of bilateral breast cysts. EXAM: DIGITAL DIAGNOSTIC BILATERAL MAMMOGRAM WITH CAD AND TOMO  ULTRASOUND RIGHT BREAST COMPARISON:  Previous exam(s). ACR Breast Density Category c: The breast tissue is heterogeneously dense, which may obscure small masses. FINDINGS: There is an oval circumscribed mass within the outer RIGHT breast, corresponding to the area of clinical concern, measuring approximately 6 mm greatest dimension. There are no new  dominant masses, suspicious calcifications or secondary signs of malignancy elsewhere within the RIGHT breast. There are no new dominant masses, suspicious calcifications or secondary signs of malignancy elsewhere within the LEFT breast. Mammographic images were processed with CAD. Targeted ultrasound is performed, evaluating the outer RIGHT breast with particular attention to the 9 o'clock axis as directed by the patient, showing a benign cyst at the 9 o'clock axis, 6 cm from the nipple, measuring 5 mm, corresponding to the mammographic finding. There is also a benign intramammary lymph node within the RIGHT breast at the 9 o'clock axis, in the vicinity of the area of clinical concern. No suspicious solid or cystic mass is identified within the outer RIGHT breast. IMPRESSION: No evidence of malignancy within either breast. Benign cyst within the RIGHT breast at the 9 o'clock axis, measuring 5 mm. RECOMMENDATION: 1.  Screening mammogram in one year.(Code:SM-B-01Y) 2. Benign causes of breast pain, and possible remedies, were discussed with the patient. Patient was encouraged to follow-up with referring physician if pain worsened or if a palpable lump/mass developed. I have discussed the findings and recommendations with the patient. If applicable, a reminder letter will be sent to the patient regarding the next appointment. BI-RADS CATEGORY  2: Benign. Electronically Signed   By: Franki Cabot M.D.   On: 06/08/2019 09:59   MM DIAG BREAST TOMO BILATERAL  Result Date: 06/08/2019 CLINICAL DATA:  Patient describes focal pain within the outer RIGHT breast. History of  bilateral breast cysts. EXAM: DIGITAL DIAGNOSTIC BILATERAL MAMMOGRAM WITH CAD AND TOMO ULTRASOUND RIGHT BREAST COMPARISON:  Previous exam(s). ACR Breast Density Category c: The breast tissue is heterogeneously dense, which may obscure small masses. FINDINGS: There is an oval circumscribed mass within the outer RIGHT breast, corresponding to the area of clinical concern, measuring approximately 6 mm greatest dimension. There are no new dominant masses, suspicious calcifications or secondary signs of malignancy elsewhere within the RIGHT breast. There are no new dominant masses, suspicious calcifications or secondary signs of malignancy elsewhere within the LEFT breast. Mammographic images were processed with CAD. Targeted ultrasound is performed, evaluating the outer RIGHT breast with particular attention to the 9 o'clock axis as directed by the patient, showing a benign cyst at the 9 o'clock axis, 6 cm from the nipple, measuring 5 mm, corresponding to the mammographic finding. There is also a benign intramammary lymph node within the RIGHT breast at the 9 o'clock axis, in the vicinity of the area of clinical concern. No suspicious solid or cystic mass is identified within the outer RIGHT breast. IMPRESSION: No evidence of malignancy within either breast. Benign cyst within the RIGHT breast at the 9 o'clock axis, measuring 5 mm. RECOMMENDATION: 1.  Screening mammogram in one year.(Code:SM-B-01Y) 2. Benign causes of breast pain, and possible remedies, were discussed with the patient. Patient was encouraged to follow-up with referring physician if pain worsened or if a palpable lump/mass developed. I have discussed the findings and recommendations with the patient. If applicable, a reminder letter will be sent to the patient regarding the next appointment. BI-RADS CATEGORY  2: Benign. Electronically Signed   By: Franki Cabot M.D.   On: 06/08/2019 09:59    Assessment & Plan:   There are no diagnoses linked to this  encounter.   No orders of the defined types were placed in this encounter.    Follow-up: No follow-ups on file.  Walker Kehr, MD

## 2019-07-26 NOTE — Assessment & Plan Note (Signed)
Karina Newman saw Dr Estanislado Pandy - started Duloxetine; now is c/o constipation

## 2019-07-26 NOTE — Assessment & Plan Note (Signed)
On Vit D 

## 2019-07-27 LAB — PMP SCREEN PROFILE (10S), URINE
Amphetamine Scrn, Ur: NEGATIVE ng/mL
BARBITURATE SCREEN URINE: NEGATIVE ng/mL
BENZODIAZEPINE SCREEN, URINE: NEGATIVE ng/mL
CANNABINOIDS UR QL SCN: NEGATIVE ng/mL
Cocaine (Metab) Scrn, Ur: NEGATIVE ng/mL
Creatinine(Crt), U: 223 mg/dL (ref 20.0–300.0)
Methadone Screen, Urine: NEGATIVE ng/mL
OXYCODONE+OXYMORPHONE UR QL SCN: NEGATIVE ng/mL
Opiate Scrn, Ur: NEGATIVE ng/mL
Ph of Urine: 6.4 (ref 4.5–8.9)
Phencyclidine Qn, Ur: NEGATIVE ng/mL
Propoxyphene Scrn, Ur: NEGATIVE ng/mL

## 2019-08-01 NOTE — Progress Notes (Signed)
Virtual Visit via Telephone Note  I connected with Karina Newman on 08/02/19 at  9:30 AM EST by telephone and verified that I am speaking with the correct person using two identifiers.  Location: Patient: Home Provider: Clinic  This service was conducted via virtual visit.   The patient was located at home. I was located in my office.  Consent was obtained prior to the virtual visit and is aware of possible charges through their insurance for this visit.  The patient is an established patient.  Dr. Estanislado Pandy, MD conducted the virtual visit and Hazel Sams, PA-C acted as scribe during the service.  Office staff helped with scheduling follow up visits after the service was conducted.   I discussed the limitations, risks, security and privacy concerns of performing an evaluation and management service by telephone and the availability of in person appointments. I also discussed with the patient that there may be a patient responsible charge related to this service. The patient expressed understanding and agreed to proceed.  CC: Left knee joint pain  History of Present Illness: Patient is a 63 year old female with a past medical history of osteoarthritis and DDD.  She was started on cymbalta 30 mg 1 capsule by mouth daily about 1 month ago.  She initially experienced lightheadedness which has resolved.  She has also been experiencing constipation, but she is unsure if it is related.  She has not noticed any improvement since taking cymbalta and would like to discontinue.  She continues to have pain in both hands and the left knee joint.  She continues to experience trapezius muscle tension and tenderness bilaterally.    Review of Systems  Constitutional: Positive for malaise/fatigue. Negative for fever.  Eyes: Negative for photophobia, pain, discharge and redness.       +Dry eyes   Respiratory: Negative for cough, shortness of breath and wheezing.   Cardiovascular: Negative for chest pain and  palpitations.  Gastrointestinal: Positive for constipation. Negative for blood in stool and diarrhea.  Genitourinary: Negative for dysuria.  Musculoskeletal: Positive for joint pain. Negative for back pain, myalgias and neck pain.  Skin: Negative for rash.  Neurological: Negative for dizziness and headaches.  Psychiatric/Behavioral: Negative for depression. The patient has insomnia. The patient is not nervous/anxious.       Observations/Objective: Physical Exam  Constitutional: She is oriented to person, place, and time.  Neurological: She is alert and oriented to person, place, and time.  Psychiatric: Mood, memory, affect and judgment normal.   Patient reports morning stiffness for 10-15 minutes.   Patient denies nocturnal pain.  Difficulty dressing/grooming: Denies Difficulty climbing stairs: Reports Difficulty getting out of chair: Denies Difficulty using hands for taps, buttons, cutlery, and/or writing: Denies   Assessment and Plan: Visit Diagnoses: Primary osteoarthritis of both knees: She has moderate osteoarthritis and moderate chondromalacia patella bilaterally: She has persistent left knee joint pain.  She is not having any inflammation at this time.  She is not experiencing mechanical symptoms at this time.  She would like to schedule a left knee joint cortisone injection.  We discussed that if she has an inadequate response to cortisone, we can apply for visco gel injections in the future.  She was advised to use pennsaid topically as needed for pain relief.  RICE was discussed.  The importance of performing knee joint exercises were discussed. She was started on Cymbalta 30 mg 1 capsule by mouth daily about 1 month ago.  She is not tolerating cymbalta and  has not noticed any improvement, so she would like to discontinue.  She was advised to taper off of cymbalta by taking 1 capsule every other day for 1 week then to discontinue.  We will schedule an urgent procedure office visit  for a left knee joint cortisone injection and she will have a routine follow up in 6 months.  Cervical disc disorder with radiculopathy of cervical region: She has intermittent neck pain and stiffness.  She has no symptoms of radiculopathy at this time.    Trochanteric bursitis of left hip: She has intermittent discomfort.  She was encouraged to continue to stretch on a regular basis.   Pain in both hands: She is having increased pain in her DIP joints.  No joint inflammation.  She is able to make a complete fist.  She is not having difficulty with ADLs.  Joint protection and muscle strengthening were discussed.   Other medica conditions are listed as follows:   HTN (hypertension), benign  Hypertensive left ventricular hypertrophy, without heart failure  History of gastroesophageal reflux (GERD)  Vitamin D deficiency  Fatty liver  Other insomnia  Hyperthyroidism  Tubular adenoma of colon  Follow Up Instructions: She will follow up in 6 months.    I discussed the assessment and treatment plan with the patient. The patient was provided an opportunity to ask questions and all were answered. The patient agreed with the plan and demonstrated an understanding of the instructions.   The patient was advised to call back or seek an in-person evaluation if the symptoms worsen or if the condition fails to improve as anticipated.  I provided 15 minutes of non-face-to-face time during this encounter.   Bo Merino, MD    Scribed by-  Hazel Sams , PA-C

## 2019-08-02 ENCOUNTER — Other Ambulatory Visit: Payer: Self-pay

## 2019-08-02 ENCOUNTER — Other Ambulatory Visit: Payer: Self-pay | Admitting: Cardiovascular Disease

## 2019-08-02 ENCOUNTER — Telehealth (INDEPENDENT_AMBULATORY_CARE_PROVIDER_SITE_OTHER): Payer: Federal, State, Local not specified - PPO | Admitting: Rheumatology

## 2019-08-02 ENCOUNTER — Encounter: Payer: Self-pay | Admitting: Rheumatology

## 2019-08-02 DIAGNOSIS — E559 Vitamin D deficiency, unspecified: Secondary | ICD-10-CM

## 2019-08-02 DIAGNOSIS — M7062 Trochanteric bursitis, left hip: Secondary | ICD-10-CM

## 2019-08-02 DIAGNOSIS — M5010 Cervical disc disorder with radiculopathy, unspecified cervical region: Secondary | ICD-10-CM | POA: Diagnosis not present

## 2019-08-02 DIAGNOSIS — D126 Benign neoplasm of colon, unspecified: Secondary | ICD-10-CM

## 2019-08-02 DIAGNOSIS — I10 Essential (primary) hypertension: Secondary | ICD-10-CM

## 2019-08-02 DIAGNOSIS — E0590 Thyrotoxicosis, unspecified without thyrotoxic crisis or storm: Secondary | ICD-10-CM

## 2019-08-02 DIAGNOSIS — M79642 Pain in left hand: Secondary | ICD-10-CM

## 2019-08-02 DIAGNOSIS — I119 Hypertensive heart disease without heart failure: Secondary | ICD-10-CM

## 2019-08-02 DIAGNOSIS — G4709 Other insomnia: Secondary | ICD-10-CM | POA: Diagnosis not present

## 2019-08-02 DIAGNOSIS — Z8719 Personal history of other diseases of the digestive system: Secondary | ICD-10-CM

## 2019-08-02 DIAGNOSIS — M79641 Pain in right hand: Secondary | ICD-10-CM

## 2019-08-02 DIAGNOSIS — M170 Bilateral primary osteoarthritis of knee: Secondary | ICD-10-CM

## 2019-08-02 DIAGNOSIS — K760 Fatty (change of) liver, not elsewhere classified: Secondary | ICD-10-CM

## 2019-08-02 NOTE — Telephone Encounter (Signed)
Rx request sent to pharmacy.  

## 2019-08-03 ENCOUNTER — Ambulatory Visit (INDEPENDENT_AMBULATORY_CARE_PROVIDER_SITE_OTHER): Payer: Federal, State, Local not specified - PPO | Admitting: Rheumatology

## 2019-08-03 ENCOUNTER — Other Ambulatory Visit: Payer: Self-pay

## 2019-08-03 VITALS — BP 143/88 | HR 91

## 2019-08-03 DIAGNOSIS — M25562 Pain in left knee: Secondary | ICD-10-CM

## 2019-08-03 DIAGNOSIS — M170 Bilateral primary osteoarthritis of knee: Secondary | ICD-10-CM

## 2019-08-03 DIAGNOSIS — G8929 Other chronic pain: Secondary | ICD-10-CM

## 2019-08-03 NOTE — Progress Notes (Signed)
   Procedure Note  Patient: Karina Newman             Date of Birth: Aug 29, 1955           MRN: [id number]             Visit Date: 08/03/2019  Procedures: Visit Diagnoses:  1. Primary osteoarthritis of both knees   2. Chronic pain of left knee     Large Joint Inj: L knee on 08/03/2019 11:49 AM Indications: pain Details: 27 G 1.5 in needle, medial approach  Arthrogram: No  Medications: 1.5 mL lidocaine 1 %; 40 mg triamcinolone acetonide 40 MG/ML Aspirate: 0 mL Outcome: tolerated well, no immediate complications Procedure, treatment alternatives, risks and benefits explained, specific risks discussed. Consent was given by the patient. Immediately prior to procedure a time out was called to verify the correct patient, procedure, equipment, support staff and site/side marked as required. Patient was prepped and draped in the usual sterile fashion.    Bo Merino, MD

## 2019-08-06 ENCOUNTER — Ambulatory Visit: Payer: Federal, State, Local not specified - PPO | Admitting: Rheumatology

## 2019-08-29 ENCOUNTER — Other Ambulatory Visit: Payer: Self-pay | Admitting: Internal Medicine

## 2019-09-25 ENCOUNTER — Other Ambulatory Visit: Payer: Self-pay

## 2019-09-26 ENCOUNTER — Encounter: Payer: Self-pay | Admitting: Obstetrics and Gynecology

## 2019-09-26 ENCOUNTER — Ambulatory Visit (INDEPENDENT_AMBULATORY_CARE_PROVIDER_SITE_OTHER): Payer: Federal, State, Local not specified - PPO | Admitting: Obstetrics and Gynecology

## 2019-09-26 VITALS — BP 124/80 | Ht 64.0 in | Wt 177.0 lb

## 2019-09-26 DIAGNOSIS — Z13820 Encounter for screening for osteoporosis: Secondary | ICD-10-CM | POA: Diagnosis not present

## 2019-09-26 DIAGNOSIS — Z9189 Other specified personal risk factors, not elsewhere classified: Secondary | ICD-10-CM | POA: Diagnosis not present

## 2019-09-26 DIAGNOSIS — Z9289 Personal history of other medical treatment: Secondary | ICD-10-CM | POA: Diagnosis not present

## 2019-09-26 DIAGNOSIS — R102 Pelvic and perineal pain: Secondary | ICD-10-CM

## 2019-09-26 DIAGNOSIS — Z780 Asymptomatic menopausal state: Secondary | ICD-10-CM | POA: Diagnosis not present

## 2019-09-26 DIAGNOSIS — Z1151 Encounter for screening for human papillomavirus (HPV): Secondary | ICD-10-CM

## 2019-09-26 DIAGNOSIS — Z01411 Encounter for gynecological examination (general) (routine) with abnormal findings: Secondary | ICD-10-CM | POA: Diagnosis not present

## 2019-09-26 DIAGNOSIS — Z8742 Personal history of other diseases of the female genital tract: Secondary | ICD-10-CM | POA: Diagnosis not present

## 2019-09-26 DIAGNOSIS — L720 Epidermal cyst: Secondary | ICD-10-CM

## 2019-09-26 DIAGNOSIS — N950 Postmenopausal bleeding: Secondary | ICD-10-CM

## 2019-09-26 DIAGNOSIS — Z01419 Encounter for gynecological examination (general) (routine) without abnormal findings: Secondary | ICD-10-CM

## 2019-09-26 DIAGNOSIS — R87810 Cervical high risk human papillomavirus (HPV) DNA test positive: Secondary | ICD-10-CM

## 2019-09-26 NOTE — Addendum Note (Signed)
Addended by: Nelva Nay on: 09/26/2019 03:50 PM   Modules accepted: Orders

## 2019-09-26 NOTE — Progress Notes (Signed)
Karina Newman 1955-09-24 [id number]  SUBJECTIVE:  64 y.o. G5P014 female for annual routine gynecologic exam and Pap smear.  Last seen in this office 01/2017.  No thank you In the last 6-7 months she has been getting a pain in her lower abdomen at random, occasionally getting a significant sharp pain that lasts for a few hours or days.  Occasionally twisting at the waist will flare this pain up otherwise it just starts without a preceding event.  States she has a daily BM without any pain.  No dysuria symptoms.  Having some vaginal spotting at random, no heavy bleeding.  Also concerned about 'bumps' on vulvar area that are causing her some discomfort at times.    Current Outpatient Medications  Medication Sig Dispense Refill  . amLODipine (NORVASC) 5 MG tablet Take 1 tablet (5 mg total) by mouth daily. 90 tablet 3  . Aspirin-Caffeine (BAYER BACK & BODY PO) Take by mouth as needed.    . carvedilol (COREG) 12.5 MG tablet Take 1 tablet (12.5 mg total) by mouth 2 (two) times daily. 180 tablet 0  . Cyanocobalamin (VITAMIN B-12) 500 MCG SUBL 1 sl qd 100 tablet 5  . Diclofenac Sodium (PENNSAID) 2 % SOLN Place 1 application onto the skin 4 (four) times daily as needed (arthritis). 112 g 5  . doxazosin (CARDURA) 4 MG tablet TAKE 1 TABLET BY MOUTH EVERY DAY 90 tablet 2  . fluticasone (FLONASE) 50 MCG/ACT nasal spray PLACE 1 SPRAY INTO BOTH NOSTRILS AS NEEDED. 16 g 11  . gabapentin (NEURONTIN) 100 MG capsule TAKE 1 CAPSULE BY MOUTH THREE TIMES A DAY AS NEEDED 90 capsule 3  . omeprazole (PRILOSEC) 40 MG capsule TAKE 1 CAPSULE BY MOUTH EVERY DAY 90 capsule 3  . potassium chloride (K-DUR) 10 MEQ tablet TAKE 1 TABLET BY MOUTH EVERY DAY 30 tablet 5  . sucralfate (CARAFATE) 1 g tablet TAKE 1 TABLET BY MOUTH 3 TIMES A DAY BETWEEN MEALS 90 tablet 11  . telmisartan-hydrochlorothiazide (MICARDIS HCT) 80-25 MG tablet TAKE 1 TABLET BY MOUTH EVERY DAY 90 tablet 1  . valACYclovir (VALTREX) 500 MG tablet TAKE 1 TABLET  (500 MG TOTAL) BY MOUTH 2 (TWO) TIMES DAILY. FOR 5 DAYS WITH OUTBREAK 30 tablet 1  . Vitamin D, Cholecalciferol, 1000 units CAPS Take 2,000 Units by mouth.    . clindamycin (CLINDAGEL) 1 % gel APPLY TO AFFECTED AREA TWICE A DAY (Patient not taking: No sig reported) 30 g 0  . cyclobenzaprine (FLEXERIL) 5 MG tablet Take 1 tablet (5 mg total) by mouth 3 (three) times daily as needed for muscle spasms. (Patient not taking: Reported on 09/26/2019) 30 tablet 1  . DULoxetine (CYMBALTA) 30 MG capsule Take 1 capsule (30 mg total) by mouth daily. (Patient not taking: Reported on 09/26/2019) 30 capsule 3   No current facility-administered medications for this visit.   Allergies: Penicillins, Gabapentin, Iodine, Lyrica [pregabalin], Nsaids, Tramadol, and Voltaren [diclofenac]  No LMP recorded. Patient is postmenopausal.  Past medical history,surgical history, problem list, medications, allergies, family history and social history were all reviewed and documented as reviewed in the EPIC chart.  ROS:  Feeling well. No dyspnea or chest pain on exertion.  No abdominal pain, change in bowel habits, black or bloody stools.  No urinary tract symptoms. GYN ROS: normal menses, no abnormal bleeding, pelvic pain or discharge, no breast pain or new or enlarging lumps on self exam. No neurological complaints.    OBJECTIVE:  BP 124/80   Ht 5'  4" (1.626 m)   Wt 177 lb (80.3 kg)   BMI 30.38 kg/m  The patient appears well, alert, oriented x 3, in no distress. ENT normal.  Neck supple. No cervical or supraclavicular adenopathy or thyromegaly.  Lungs are clear, good air entry, no wheezes, rhonchi or rales. S1 and S2 normal, no murmurs, regular rate and rhythm.  Abdomen soft without tenderness, guarding, mass or organomegaly.  Midline vertical scar noted, slight pain to palpation just to the left of the inferior aspect of the scar. Neurological is normal, no focal findings.  BREAST EXAM: breasts appear normal, no  suspicious masses, no skin or nipple changes or axillary nodes  PELVIC EXAM: VULVA: normal appearing vulva with no masses, 2-3 pinpoint 1-2 mm mucous filled cystic domed lesions on external right labia minora appear to be milia, no tenderness or lesions, atrophic changes noted, VAGINA: normal appearing vagina with normal color and discharge, no lesions, CERVIX: normal appearing cervix without discharge or lesions, UTERUS: uterus is normal size, shape, consistency and nontender, ADNEXA: normal adnexa in size, nontender and no masses, RECTAL: normal rectal, no masses, PAP: Pap smear done today, thin-prep method  Chaperone: Caryn Bee present during the examination  ASSESSMENT:  64 y.o. [id number] here for annual gynecologic exam  PLAN:   1.  Postmenopausal.  She does note some vaginal spotting intermittently.  History of abnormal Pap smear noted.  I have ordered a pelvic ultrasound to evaluate endometrial thickness. 2. Pap smear 01/2017, normal cytology, positive high risk HPV, positive 18/45.  Colposcopy was recommended, but from documentation is not clear that this was actually performed, although she thinks it was.  A Pap smear and HPV cotest is obtained today. 3.  Lower abdominal pain.  As this is right next to her previous surgical scar line, I wonder if there is any contribution from adhesive disease, but it is a bit unusual that this would just be starting up 30 years after her last surgery.  Muscular spasm or bladder spasm would also be a possible other explanation.  We will check a urinalysis today.  I also have ordered a pelvic ultrasound to rule out adnexal pathology that is not detectable by today's pelvic examination.  It does appear that she brought this up at her last visit with [place] in 2018 and at that time there is no obvious CT that was negative for pelvic pathology. 4.  Vulvar lesions.  Appear to be milia.  Not consistent with warts and gross appearance.  This is not contagious.  Patient is  reassured.  Use of warm compresses if they are tender.  5. Mammogram 05/2019. Will continue with annual mammography. Breast exam normal today. 6. Colonoscopy 2017. Recommended that she continue per the prescribed interval.   7. DEXA baseline bone density is recommended and ordered. 8. Health maintenance.  No lab work as she has this completed with her primary care provider.  She will proceed to lab today for CBC, CMP, lipid profile, TSH, and vitamin D.  Return annually or sooner, prn.  Joseph Pierini MD  09/26/19

## 2019-09-26 NOTE — Patient Instructions (Signed)
Please schedule an ultrasound to evaluate your ovaries and rule that out as a potential cause for abdominal pain, and also to look at the uterus since you have had spotting We will plan to do the DEXA/bone density scan as soon as convenient for you.  Continue weight bearing exercise, and vitamin D/calcium intake.

## 2019-09-27 ENCOUNTER — Other Ambulatory Visit: Payer: Self-pay | Admitting: Cardiovascular Disease

## 2019-09-27 ENCOUNTER — Other Ambulatory Visit: Payer: Self-pay

## 2019-09-27 ENCOUNTER — Ambulatory Visit: Payer: Federal, State, Local not specified - PPO | Admitting: Internal Medicine

## 2019-09-27 ENCOUNTER — Encounter: Payer: Self-pay | Admitting: Internal Medicine

## 2019-09-27 ENCOUNTER — Other Ambulatory Visit: Payer: Self-pay | Admitting: Internal Medicine

## 2019-09-27 VITALS — BP 136/82 | HR 91 | Temp 98.9°F | Ht 64.0 in | Wt 179.0 lb

## 2019-09-27 DIAGNOSIS — B078 Other viral warts: Secondary | ICD-10-CM

## 2019-09-27 DIAGNOSIS — E559 Vitamin D deficiency, unspecified: Secondary | ICD-10-CM | POA: Diagnosis not present

## 2019-09-27 DIAGNOSIS — M25511 Pain in right shoulder: Secondary | ICD-10-CM | POA: Diagnosis not present

## 2019-09-27 DIAGNOSIS — D485 Neoplasm of uncertain behavior of skin: Secondary | ICD-10-CM

## 2019-09-27 LAB — PAP IG AND HPV HIGH-RISK: HPV DNA High Risk: NOT DETECTED

## 2019-09-27 MED ORDER — METHYLPREDNISOLONE 4 MG PO TBPK: ORAL_TABLET | ORAL | 0 refills | Status: DC

## 2019-09-27 NOTE — Assessment & Plan Note (Signed)
R cheek See procedure

## 2019-09-27 NOTE — Assessment & Plan Note (Signed)
Medrol pack Gabapentin

## 2019-09-27 NOTE — Progress Notes (Addendum)
Subjective:  Patient ID: Karina Newman, female    DOB: 12/09/1955  Age: 64 y.o. MRN: [id number]  CC: No chief complaint on file.   HPI Terrica Dace presents for a skin lesion on the R cheek for several months, itching  C/o pains all over - R shoulder, R hip - severe. Gabapentin is not helping...    Outpatient Medications Prior to Visit  Medication Sig Dispense Refill  . amLODipine (NORVASC) 5 MG tablet Take 1 tablet (5 mg total) by mouth daily. 90 tablet 3  . Aspirin-Caffeine (BAYER BACK & BODY PO) Take by mouth as needed.    . carvedilol (COREG) 12.5 MG tablet Take 1 tablet (12.5 mg total) by mouth 2 (two) times daily. 180 tablet 0  . clindamycin (CLINDAGEL) 1 % gel APPLY TO AFFECTED AREA TWICE A DAY 30 g 0  . Cyanocobalamin (VITAMIN B-12) 500 MCG SUBL 1 sl qd 100 tablet 5  . cyclobenzaprine (FLEXERIL) 5 MG tablet Take 1 tablet (5 mg total) by mouth 3 (three) times daily as needed for muscle spasms. 30 tablet 1  . Diclofenac Sodium (PENNSAID) 2 % SOLN Place 1 application onto the skin 4 (four) times daily as needed (arthritis). 112 g 5  . doxazosin (CARDURA) 4 MG tablet TAKE 1 TABLET BY MOUTH EVERY DAY 90 tablet 2  . DULoxetine (CYMBALTA) 30 MG capsule Take 1 capsule (30 mg total) by mouth daily. 30 capsule 3  . fluticasone (FLONASE) 50 MCG/ACT nasal spray PLACE 1 SPRAY INTO BOTH NOSTRILS AS NEEDED. 16 g 11  . gabapentin (NEURONTIN) 100 MG capsule TAKE 1 CAPSULE BY MOUTH THREE TIMES A DAY AS NEEDED 90 capsule 3  . omeprazole (PRILOSEC) 40 MG capsule TAKE 1 CAPSULE BY MOUTH EVERY DAY 90 capsule 3  . potassium chloride (K-DUR) 10 MEQ tablet TAKE 1 TABLET BY MOUTH EVERY DAY 30 tablet 5  . sucralfate (CARAFATE) 1 g tablet TAKE 1 TABLET BY MOUTH 3 TIMES A DAY BETWEEN MEALS 90 tablet 11  . telmisartan-hydrochlorothiazide (MICARDIS HCT) 80-25 MG tablet TAKE 1 TABLET BY MOUTH EVERY DAY 90 tablet 1  . valACYclovir (VALTREX) 500 MG tablet TAKE 1 TABLET (500 MG TOTAL) BY MOUTH 2 (TWO) TIMES  DAILY. FOR 5 DAYS WITH OUTBREAK 30 tablet 1  . Vitamin D, Cholecalciferol, 1000 units CAPS Take 2,000 Units by mouth.     No facility-administered medications prior to visit.    ROS: Review of Systems  Constitutional: Positive for fatigue. Negative for activity change, appetite change, chills and unexpected weight change.  HENT: Negative for congestion, mouth sores and sinus pressure.   Eyes: Negative for visual disturbance.  Respiratory: Negative for cough and chest tightness.   Gastrointestinal: Negative for abdominal pain and nausea.  Genitourinary: Negative for difficulty urinating, frequency and vaginal pain.  Musculoskeletal: Positive for arthralgias, back pain, neck pain and neck stiffness. Negative for gait problem.  Skin: Negative for pallor and rash.  Neurological: Negative for dizziness, tremors, weakness, numbness and headaches.  Psychiatric/Behavioral: Positive for dysphoric mood. Negative for confusion and sleep disturbance.    Objective:  BP 136/82 (BP Location: Left Arm, Patient Position: Sitting, Cuff Size: Normal)   Pulse 91   Temp 98.9 F (37.2 C) (Oral)   Ht 5\' 4"  (1.626 m)   Wt 179 lb (81.2 kg)   SpO2 99%   BMI 30.73 kg/m   BP Readings from Last 3 Encounters:  09/27/19 136/82  09/26/19 124/80  08/03/19 (!) 143/88    Wt Readings from  Last 3 Encounters:  09/27/19 179 lb (81.2 kg)  09/26/19 177 lb (80.3 kg)  07/26/19 178 lb (80.7 kg)    Physical Exam Constitutional:      General: She is not in acute distress.    Appearance: She is well-developed.  HENT:     Head: Normocephalic.     Right Ear: External ear normal.     Left Ear: External ear normal.     Nose: Nose normal.  Eyes:     General:        Right eye: No discharge.        Left eye: No discharge.     Conjunctiva/sclera: Conjunctivae normal.     Pupils: Pupils are equal, round, and reactive to light.  Neck:     Thyroid: No thyromegaly.     Vascular: No JVD.     Trachea: No tracheal  deviation.  Cardiovascular:     Rate and Rhythm: Normal rate and regular rhythm.     Heart sounds: Normal heart sounds.  Pulmonary:     Effort: No respiratory distress.     Breath sounds: No stridor. No wheezing.  Abdominal:     General: Bowel sounds are normal. There is no distension.     Palpations: Abdomen is soft. There is no mass.     Tenderness: There is no abdominal tenderness. There is no guarding or rebound.  Musculoskeletal:        General: No tenderness.     Cervical back: Normal range of motion and neck supple.  Lymphadenopathy:     Cervical: No cervical adenopathy.  Skin:    Findings: No erythema or rash.  Neurological:     Cranial Nerves: No cranial nerve deficit.     Motor: No abnormal muscle tone.     Coordination: Coordination normal.     Deep Tendon Reflexes: Reflexes normal.  Psychiatric:        Behavior: Behavior normal.        Thought Content: Thought content normal.        Judgment: Judgment normal.     R cheek 4x2 mm    Procedure Note :     Procedure :  Skin biopsy   Indication:  Changing mole (s )   Risks including unsuccessful procedure , bleeding, infection, bruising, scar, a need for another complete procedure and others were explained to the patient in detail as well as the benefits. Informed consent was obtained.  The patient was placed in a decubitus position.  Lesion #1 on   R cheek  measuring  2x4   mm   Skin over lesion #1  was prepped with Betadine and alcohol  and anesthetized with 1 cc of 2% lidocaine and epinephrine, using a 25-gauge 1 inch needle.  Shave biopsy with a sterile Dermablade was carried out in the usual fashion. Hyfrecator was used to destroy the rest of the lesion potentially left behind and for hemostasis. Band-Aid was applied with antibiotic ointment.      Postprocedure instructions :    A Band-Aid should be  changed twice daily. You can take a shower tomorrow.  Keep the wounds clean. You can wash them with  liquid soap and water. Pat dry with gauze or a Kleenex tissue  Before applying antibiotic ointment and a Band-Aid.   You need to report immediately  if fever, chills or any signs of infection develop.    The biopsy results should be available in 1 -2 weeks.   Lab  Results  Component Value Date   WBC 6.2 07/26/2019   HGB 13.8 07/26/2019   HCT 40.6 07/26/2019   PLT 398.0 07/26/2019   GLUCOSE 105 (H) 07/26/2019   CHOL 188 07/26/2019   TRIG 120.0 07/26/2019   HDL 40.90 07/26/2019   LDLCALC 123 (H) 07/26/2019   ALT 27 07/26/2019   AST 18 07/26/2019   NA 137 07/26/2019   K 4.0 07/26/2019   CL 98 07/26/2019   CREATININE 0.84 07/26/2019   BUN 14 07/26/2019   CO2 29 07/26/2019   TSH 0.39 07/26/2019    US BREAST LTD UNI RIGHT INC AXILLA  Result Date: 06/08/2019 CLINICAL DATA:  Patient describes focal pain within the outer RIGHT breast. History of bilateral breast cysts. EXAM: DIGITAL DIAGNOSTIC BILATERAL MAMMOGRAM WITH CAD AND TOMO ULTRASOUND RIGHT BREAST COMPARISON:  Previous exam(s). ACR Breast Density Category c: The breast tissue is heterogeneously dense, which may obscure small masses. FINDINGS: There is an oval circumscribed mass within the outer RIGHT breast, corresponding to the area of clinical concern, measuring approximately 6 mm greatest dimension. There are no new dominant masses, suspicious calcifications or secondary signs of malignancy elsewhere within the RIGHT breast. There are no new dominant masses, suspicious calcifications or secondary signs of malignancy elsewhere within the LEFT breast. Mammographic images were processed with CAD. Targeted ultrasound is performed, evaluating the outer RIGHT breast with particular attention to the 9 o'clock axis as directed by the patient, showing a benign cyst at the 9 o'clock axis, 6 cm from the nipple, measuring 5 mm, corresponding to the mammographic finding. There is also a benign intramammary lymph node within the RIGHT breast at the  9 o'clock axis, in the vicinity of the area of clinical concern. No suspicious solid or cystic mass is identified within the outer RIGHT breast. IMPRESSION: No evidence of malignancy within either breast. Benign cyst within the RIGHT breast at the 9 o'clock axis, measuring 5 mm. RECOMMENDATION: 1.  Screening mammogram in one year.(Code:SM-B-01Y) 2. Benign causes of breast pain, and possible remedies, were discussed with the patient. Patient was encouraged to follow-up with referring physician if pain worsened or if a palpable lump/mass developed. I have discussed the findings and recommendations with the patient. If applicable, a reminder letter will be sent to the patient regarding the next appointment. BI-RADS CATEGORY  2: Benign. Electronically Signed   By: Franki Cabot M.D.   On: 06/08/2019 09:59   MM DIAG BREAST TOMO BILATERAL  Result Date: 06/08/2019 CLINICAL DATA:  Patient describes focal pain within the outer RIGHT breast. History of bilateral breast cysts. EXAM: DIGITAL DIAGNOSTIC BILATERAL MAMMOGRAM WITH CAD AND TOMO ULTRASOUND RIGHT BREAST COMPARISON:  Previous exam(s). ACR Breast Density Category c: The breast tissue is heterogeneously dense, which may obscure small masses. FINDINGS: There is an oval circumscribed mass within the outer RIGHT breast, corresponding to the area of clinical concern, measuring approximately 6 mm greatest dimension. There are no new dominant masses, suspicious calcifications or secondary signs of malignancy elsewhere within the RIGHT breast. There are no new dominant masses, suspicious calcifications or secondary signs of malignancy elsewhere within the LEFT breast. Mammographic images were processed with CAD. Targeted ultrasound is performed, evaluating the outer RIGHT breast with particular attention to the 9 o'clock axis as directed by the patient, showing a benign cyst at the 9 o'clock axis, 6 cm from the nipple, measuring 5 mm, corresponding to the mammographic  finding. There is also a benign intramammary lymph node within the RIGHT breast  at the 9 o'clock axis, in the vicinity of the area of clinical concern. No suspicious solid or cystic mass is identified within the outer RIGHT breast. IMPRESSION: No evidence of malignancy within either breast. Benign cyst within the RIGHT breast at the 9 o'clock axis, measuring 5 mm. RECOMMENDATION: 1.  Screening mammogram in one year.(Code:SM-B-01Y) 2. Benign causes of breast pain, and possible remedies, were discussed with the patient. Patient was encouraged to follow-up with referring physician if pain worsened or if a palpable lump/mass developed. I have discussed the findings and recommendations with the patient. If applicable, a reminder letter will be sent to the patient regarding the next appointment. BI-RADS CATEGORY  2: Benign. Electronically Signed   By: Franki Cabot M.D.   On: 06/08/2019 09:59    Assessment & Plan:   There are no diagnoses linked to this encounter.   No orders of the defined types were placed in this encounter.    Follow-up: No follow-ups on file.  Walker Kehr, MD

## 2019-09-27 NOTE — Assessment & Plan Note (Signed)
Cont w/Vit D 

## 2019-09-28 ENCOUNTER — Telehealth: Payer: Self-pay

## 2019-09-28 LAB — URINALYSIS, COMPLETE W/RFL CULTURE
Bilirubin Urine: NEGATIVE
Glucose, UA: NEGATIVE
Hgb urine dipstick: NEGATIVE
Hyaline Cast: NONE SEEN /LPF
Ketones, ur: NEGATIVE
Leukocyte Esterase: NEGATIVE
Nitrites, Initial: NEGATIVE
Protein, ur: NEGATIVE
Specific Gravity, Urine: 1.02 (ref 1.001–1.03)
pH: 6.5 (ref 5.0–8.0)

## 2019-09-28 LAB — URINE CULTURE
MICRO NUMBER:: 10137480
Result:: NO GROWTH
SPECIMEN QUALITY:: ADEQUATE

## 2019-09-28 LAB — CULTURE INDICATED

## 2019-09-28 NOTE — Telephone Encounter (Signed)
Call attempted to schedule patient for follow-up appointment to get refills. Unable to reach by phone or leave a message.

## 2019-10-17 ENCOUNTER — Other Ambulatory Visit: Payer: Self-pay

## 2019-10-18 ENCOUNTER — Ambulatory Visit: Payer: Federal, State, Local not specified - PPO | Admitting: Obstetrics and Gynecology

## 2019-10-18 ENCOUNTER — Ambulatory Visit (INDEPENDENT_AMBULATORY_CARE_PROVIDER_SITE_OTHER): Payer: Federal, State, Local not specified - PPO

## 2019-10-18 ENCOUNTER — Encounter: Payer: Self-pay | Admitting: Obstetrics and Gynecology

## 2019-10-18 VITALS — BP 122/76

## 2019-10-18 DIAGNOSIS — Z8742 Personal history of other diseases of the female genital tract: Secondary | ICD-10-CM

## 2019-10-18 DIAGNOSIS — D251 Intramural leiomyoma of uterus: Secondary | ICD-10-CM | POA: Diagnosis not present

## 2019-10-18 DIAGNOSIS — R102 Pelvic and perineal pain: Secondary | ICD-10-CM | POA: Diagnosis not present

## 2019-10-18 DIAGNOSIS — D252 Subserosal leiomyoma of uterus: Secondary | ICD-10-CM | POA: Diagnosis not present

## 2019-10-18 DIAGNOSIS — N854 Malposition of uterus: Secondary | ICD-10-CM | POA: Diagnosis not present

## 2019-10-18 DIAGNOSIS — N950 Postmenopausal bleeding: Secondary | ICD-10-CM

## 2019-10-18 DIAGNOSIS — Z01419 Encounter for gynecological examination (general) (routine) without abnormal findings: Secondary | ICD-10-CM

## 2019-10-18 NOTE — Progress Notes (Signed)
   Karina Newman  1956/01/13 [id number]  HPI The patient is a 64 y.o. [id number] who presents today for a pelvic ultrasound as follow-up from her visit on 09/26/2019 when she had noted development of abdominal pain.  We see that encounter note for description of the symptoms.  Past medical history,surgical history, problem list, medications, allergies, family history and social history were all reviewed and documented as reviewed in the EPIC chart.  ROS:  Feeling well. No dyspnea or chest pain on exertion.  + Intermittent abdominal pain, no change in bowel habits, black or bloody stools.  No urinary tract symptoms. GYN ROS: normal menses, no abnormal bleeding, pelvic pain or discharge  Physical Exam  BP 122/76   General: Pleasant female, no acute distress, alert and oriented PELVIC EXAM: Exam deferred as this was performed at her recent examination visit imaging performed today  Pelvic ultrasound Anterior uterus 8.7 x 6.9 x 4.7 cm Several intramural and submucosal fibroids noted, the largest of which measures 1.9 cm maximum dimension Endometrial cavity with slight distortion by a submucosal component of an anterior fibroid Small amount of endometrial fluid Endometrial thickness 3.7 mm Bilateral ovaries small and atrophic No adnexal masses.  No free fluid.  Assessment 64 year old G5 P4 with abdominal/pelvic pain of uncertain etiology  Plan We discussed that there are no findings on the pelvic ultrasound today that would indicate the exact cause of her abdominal/pelvic pain.  Adnexa is normal, there are no ovarian masses.  She has several small fibroids within the uterus, so it would be a stretch to say that this would be causing her pain symptoms, however, involution of fibroids and menopause might cause some discomfort, but I think it is unlikely that this is the primary cause of her symptoms. I think it is more likely that her cesarean scar with 4 prior C-sections might be causing her some  stretching pain and discomfort.  She does use a heat pack over the lower abdomen which does help.  She does not describe any GI symptoms that would make me think that she necessarily has a GI issue. She also could be experiencing musculoskeletal changes in her abdominal wall, so if the pain is ongoing and getting worse, I would recommend that she see her primary care provider for further evaluation of other potential causes.  There was no evidence of hernia on her recent exam, but this would be another possibility and if the pain is ongoing she may need to consider getting a CT of the abdomen and pelvis. Reassurance is provided that there is no clear acute gynecologic etiology or abnormality at this time.  The patient is satisfied with this for now, all questions were answered by the end of the visit.  Joseph Pierini MD 10/18/19

## 2019-10-24 ENCOUNTER — Other Ambulatory Visit: Payer: Self-pay

## 2019-10-24 ENCOUNTER — Ambulatory Visit: Payer: Federal, State, Local not specified - PPO | Admitting: Internal Medicine

## 2019-10-24 ENCOUNTER — Encounter: Payer: Self-pay | Admitting: Internal Medicine

## 2019-10-24 DIAGNOSIS — G8929 Other chronic pain: Secondary | ICD-10-CM

## 2019-10-24 DIAGNOSIS — M25511 Pain in right shoulder: Secondary | ICD-10-CM | POA: Diagnosis not present

## 2019-10-24 DIAGNOSIS — J069 Acute upper respiratory infection, unspecified: Secondary | ICD-10-CM | POA: Diagnosis not present

## 2019-10-24 DIAGNOSIS — R05 Cough: Secondary | ICD-10-CM

## 2019-10-24 DIAGNOSIS — E538 Deficiency of other specified B group vitamins: Secondary | ICD-10-CM | POA: Diagnosis not present

## 2019-10-24 DIAGNOSIS — E049 Nontoxic goiter, unspecified: Secondary | ICD-10-CM

## 2019-10-24 DIAGNOSIS — J029 Acute pharyngitis, unspecified: Secondary | ICD-10-CM

## 2019-10-24 DIAGNOSIS — K219 Gastro-esophageal reflux disease without esophagitis: Secondary | ICD-10-CM | POA: Diagnosis not present

## 2019-10-24 DIAGNOSIS — R059 Cough, unspecified: Secondary | ICD-10-CM

## 2019-10-24 MED ORDER — BENZONATATE 200 MG PO CAPS: 200.0000 mg | ORAL_CAPSULE | Freq: Three times a day (TID) | ORAL | 1 refills | Status: DC | PRN

## 2019-10-24 NOTE — Assessment & Plan Note (Signed)
Chronic - worse Treat GERD ENT ref Thyroid US

## 2019-10-24 NOTE — Assessment & Plan Note (Signed)
Thyroid US

## 2019-10-24 NOTE — Assessment & Plan Note (Signed)
Tessalon

## 2019-10-24 NOTE — Patient Instructions (Signed)
FEMA mass vaccine site in [place]:  Call the COVID-19 Vaccine Help Center at [phone] to schedule your shot at [place].   [place] [facility] Newman's federal vaccine clinic is preparing to open on Wednesday 10/24/19. The FEMA site at [facility] [place] [place] has the capacity to vaccinate 3,000 people a day for eight weeks. That's nearly 170,000 doses just from this one clinic.   With such a big operation, here are answers to some questions you may have about the drive-thru and indoor site:  How do I make an appointment?   Head to [email or web address] to schedule your appointment indoors or in the drive-thru, or call the COVID-19 Vaccine Help Center at [phone].  What if I need to change or cancel my appointment, or have more questions?   Call the COVID-19 Vaccine Help Center at [phone].  What vaccines will be available at the clinic?   The vaccine clinic will begin giving both Pfizer and Moderna two-dose COVID-19 vaccines. The single-dose [employer] vaccines will be given during the last two weeks of the clinic.   What part of the [facility] [place] do I enter for my appointment?  Enter from [address] and turn onto [address]. A clinic staff member will confirm your appointment for the day. You'll then either be directed to registration or to a waiting area until your appointment time.  Can I be seen sooner if I'm early?  Those who are early will park in a designated waiting area until their vaccine appointment time approaches.  How does registration work?  There are five open lanes for registration. Someone will get the necessary information needed to confirm appointments and other details to keep a record of who's getting the vaccine every day. Temperatures will be checked to make sure you're in good shape to get the vaccine.  How long will it take to get my shot?  You'll park your car in a long tent with about 10  other vehicles. All 10 people in that group will get a shot inside their vehicles. Getting the actual shot only takes a few minutes. The entire process takes about 30 minutes.  How does the observation period work?  All patients will wait in the same tent they got their vaccine at for 15 minutes.       

## 2019-10-24 NOTE — Assessment & Plan Note (Signed)
Chronic pain - Gabapentin 

## 2019-10-24 NOTE — Assessment & Plan Note (Signed)
Vit B12 

## 2019-10-24 NOTE — Assessment & Plan Note (Signed)
Tessalon prn 

## 2019-10-24 NOTE — Assessment & Plan Note (Addendum)
Throat pain, large pills get stuck in the throat; EGD 05/25/18 - ok - Dr Sibyl Parr, Carafate po ENT ref

## 2019-10-24 NOTE — Progress Notes (Signed)
Subjective:  Patient ID: Karina Newman, female    DOB: Sep 04, 1955  Age: 64 y.o. MRN: [id number]  CC: No chief complaint on file.   HPI Karina Newman presents for pain in the neck, throat, large pills get stuck in the throat; EGD 05/25/18 - ok. C/o cough  Outpatient Medications Prior to Visit  Medication Sig Dispense Refill  . amLODipine (NORVASC) 5 MG tablet Take 1 tablet (5 mg total) by mouth daily. 90 tablet 3  . Aspirin-Caffeine (BAYER BACK & BODY PO) Take by mouth as needed.    . carvedilol (COREG) 12.5 MG tablet Take 1 tablet (12.5 mg total) by mouth 2 (two) times daily. 180 tablet 0  . clindamycin (CLINDAGEL) 1 % gel APPLY TO AFFECTED AREA TWICE A DAY 30 g 0  . Cyanocobalamin (VITAMIN B-12) 500 MCG SUBL 1 sl qd 100 tablet 5  . cyclobenzaprine (FLEXERIL) 5 MG tablet Take 1 tablet (5 mg total) by mouth 3 (three) times daily as needed for muscle spasms. 30 tablet 1  . Diclofenac Sodium (PENNSAID) 2 % SOLN Place 1 application onto the skin 4 (four) times daily as needed (arthritis). 112 g 5  . doxazosin (CARDURA) 4 MG tablet TAKE 1 TABLET BY MOUTH EVERY DAY 90 tablet 2  . DULoxetine (CYMBALTA) 30 MG capsule Take 1 capsule (30 mg total) by mouth daily. 30 capsule 3  . fluticasone (FLONASE) 50 MCG/ACT nasal spray PLACE 1 SPRAY INTO BOTH NOSTRILS AS NEEDED. 16 g 11  . gabapentin (NEURONTIN) 100 MG capsule TAKE 1 CAPSULE BY MOUTH THREE TIMES A DAY AS NEEDED 90 capsule 3  . omeprazole (PRILOSEC) 40 MG capsule TAKE 1 CAPSULE BY MOUTH EVERY DAY 90 capsule 3  . potassium chloride (K-DUR) 10 MEQ tablet TAKE 1 TABLET BY MOUTH EVERY DAY 30 tablet 5  . sucralfate (CARAFATE) 1 g tablet TAKE 1 TABLET BY MOUTH 3 TIMES A DAY BETWEEN MEALS 90 tablet 11  . telmisartan-hydrochlorothiazide (MICARDIS HCT) 80-25 MG tablet TAKE 1 TABLET BY MOUTH EVERY DAY 90 tablet 1  . valACYclovir (VALTREX) 500 MG tablet TAKE 1 TABLET (500 MG TOTAL) BY MOUTH 2 (TWO) TIMES DAILY. FOR 5 DAYS WITH OUTBREAK 30 tablet 1  .  Vitamin D, Cholecalciferol, 1000 units CAPS Take 2,000 Units by mouth.    . methylPREDNISolone (MEDROL DOSEPAK) 4 MG TBPK tablet As directed 21 tablet 0   No facility-administered medications prior to visit.    ROS: Review of Systems  Constitutional: Negative for activity change, appetite change, chills, fatigue and unexpected weight change.  HENT: Positive for sore throat. Negative for congestion, mouth sores and sinus pressure.   Eyes: Negative for visual disturbance.  Respiratory: Negative for cough and chest tightness.   Gastrointestinal: Negative for abdominal pain and nausea.  Genitourinary: Negative for difficulty urinating, frequency and vaginal pain.  Musculoskeletal: Positive for arthralgias and neck stiffness. Negative for back pain and gait problem.  Skin: Negative for pallor and rash.  Neurological: Negative for dizziness, tremors, weakness, numbness and headaches.  Psychiatric/Behavioral: Negative for confusion, sleep disturbance and suicidal ideas.    Objective:  BP 126/80 (BP Location: Left Arm, Patient Position: Sitting, Cuff Size: Normal)   Pulse 99   Temp 98.8 F (37.1 C) (Oral)   Ht 5\' 4"  (1.626 m)   Wt 181 lb (82.1 kg)   SpO2 97%   BMI 31.07 kg/m   BP Readings from Last 3 Encounters:  10/24/19 126/80  10/18/19 122/76  09/27/19 136/82    Wt Readings  from Last 3 Encounters:  10/24/19 181 lb (82.1 kg)  09/27/19 179 lb (81.2 kg)  09/26/19 177 lb (80.3 kg)    Physical Exam Constitutional:      General: She is not in acute distress.    Appearance: She is well-developed.  HENT:     Head: Normocephalic.     Right Ear: External ear normal.     Left Ear: External ear normal.     Nose: Nose normal.  Eyes:     General:        Right eye: No discharge.        Left eye: No discharge.     Conjunctiva/sclera: Conjunctivae normal.     Pupils: Pupils are equal, round, and reactive to light.  Neck:     Thyroid: No thyromegaly.     Vascular: No JVD.      Trachea: No tracheal deviation.  Cardiovascular:     Rate and Rhythm: Normal rate and regular rhythm.     Heart sounds: Normal heart sounds.  Pulmonary:     Effort: No respiratory distress.     Breath sounds: No stridor. No wheezing.  Abdominal:     General: Bowel sounds are normal. There is no distension.     Palpations: Abdomen is soft. There is no mass.     Tenderness: There is no abdominal tenderness. There is no guarding or rebound.  Musculoskeletal:        General: No tenderness.     Cervical back: Normal range of motion and neck supple.  Lymphadenopathy:     Cervical: No cervical adenopathy.  Skin:    Findings: No erythema or rash.  Neurological:     Cranial Nerves: No cranial nerve deficit.     Motor: No abnormal muscle tone.     Coordination: Coordination normal.     Deep Tendon Reflexes: Reflexes normal.  Psychiatric:        Behavior: Behavior normal.        Thought Content: Thought content normal.        Judgment: Judgment normal.   full prox neck, NT  Lab Results  Component Value Date   WBC 6.2 07/26/2019   HGB 13.8 07/26/2019   HCT 40.6 07/26/2019   PLT 398.0 07/26/2019   GLUCOSE 105 (H) 07/26/2019   CHOL 188 07/26/2019   TRIG 120.0 07/26/2019   HDL 40.90 07/26/2019   LDLCALC 123 (H) 07/26/2019   ALT 27 07/26/2019   AST 18 07/26/2019   NA 137 07/26/2019   K 4.0 07/26/2019   CL 98 07/26/2019   CREATININE 0.84 07/26/2019   BUN 14 07/26/2019   CO2 29 07/26/2019   TSH 0.39 07/26/2019    US BREAST LTD UNI RIGHT INC AXILLA  Result Date: 06/08/2019 CLINICAL DATA:  Patient describes focal pain within the outer RIGHT breast. History of bilateral breast cysts. EXAM: DIGITAL DIAGNOSTIC BILATERAL MAMMOGRAM WITH CAD AND TOMO ULTRASOUND RIGHT BREAST COMPARISON:  Previous exam(s). ACR Breast Density Category c: The breast tissue is heterogeneously dense, which may obscure small masses. FINDINGS: There is an oval circumscribed mass within the outer RIGHT breast,  corresponding to the area of clinical concern, measuring approximately 6 mm greatest dimension. There are no new dominant masses, suspicious calcifications or secondary signs of malignancy elsewhere within the RIGHT breast. There are no new dominant masses, suspicious calcifications or secondary signs of malignancy elsewhere within the LEFT breast. Mammographic images were processed with CAD. Targeted ultrasound is performed, evaluating the outer RIGHT  breast with particular attention to the 9 o'clock axis as directed by the patient, showing a benign cyst at the 9 o'clock axis, 6 cm from the nipple, measuring 5 mm, corresponding to the mammographic finding. There is also a benign intramammary lymph node within the RIGHT breast at the 9 o'clock axis, in the vicinity of the area of clinical concern. No suspicious solid or cystic mass is identified within the outer RIGHT breast. IMPRESSION: No evidence of malignancy within either breast. Benign cyst within the RIGHT breast at the 9 o'clock axis, measuring 5 mm. RECOMMENDATION: 1.  Screening mammogram in one year.(Code:SM-B-01Y) 2. Benign causes of breast pain, and possible remedies, were discussed with the patient. Patient was encouraged to follow-up with referring physician if pain worsened or if a palpable lump/mass developed. I have discussed the findings and recommendations with the patient. If applicable, a reminder letter will be sent to the patient regarding the next appointment. BI-RADS CATEGORY  2: Benign. Electronically Signed   By: Franki Cabot M.D.   On: 06/08/2019 09:59   MM DIAG BREAST TOMO BILATERAL  Result Date: 06/08/2019 CLINICAL DATA:  Patient describes focal pain within the outer RIGHT breast. History of bilateral breast cysts. EXAM: DIGITAL DIAGNOSTIC BILATERAL MAMMOGRAM WITH CAD AND TOMO ULTRASOUND RIGHT BREAST COMPARISON:  Previous exam(s). ACR Breast Density Category c: The breast tissue is heterogeneously dense, which may obscure small  masses. FINDINGS: There is an oval circumscribed mass within the outer RIGHT breast, corresponding to the area of clinical concern, measuring approximately 6 mm greatest dimension. There are no new dominant masses, suspicious calcifications or secondary signs of malignancy elsewhere within the RIGHT breast. There are no new dominant masses, suspicious calcifications or secondary signs of malignancy elsewhere within the LEFT breast. Mammographic images were processed with CAD. Targeted ultrasound is performed, evaluating the outer RIGHT breast with particular attention to the 9 o'clock axis as directed by the patient, showing a benign cyst at the 9 o'clock axis, 6 cm from the nipple, measuring 5 mm, corresponding to the mammographic finding. There is also a benign intramammary lymph node within the RIGHT breast at the 9 o'clock axis, in the vicinity of the area of clinical concern. No suspicious solid or cystic mass is identified within the outer RIGHT breast. IMPRESSION: No evidence of malignancy within either breast. Benign cyst within the RIGHT breast at the 9 o'clock axis, measuring 5 mm. RECOMMENDATION: 1.  Screening mammogram in one year.(Code:SM-B-01Y) 2. Benign causes of breast pain, and possible remedies, were discussed with the patient. Patient was encouraged to follow-up with referring physician if pain worsened or if a palpable lump/mass developed. I have discussed the findings and recommendations with the patient. If applicable, a reminder letter will be sent to the patient regarding the next appointment. BI-RADS CATEGORY  2: Benign. Electronically Signed   By: Franki Cabot M.D.   On: 06/08/2019 09:59    Assessment & Plan:   There are no diagnoses linked to this encounter.   No orders of the defined types were placed in this encounter.    Follow-up: No follow-ups on file.  Walker Kehr, MD

## 2019-10-26 ENCOUNTER — Other Ambulatory Visit: Payer: Self-pay

## 2019-10-26 MED ORDER — TELMISARTAN-HCTZ 80-25 MG PO TABS: 1.0000 | ORAL_TABLET | Freq: Every day | ORAL | 1 refills | Status: DC

## 2019-10-31 ENCOUNTER — Ambulatory Visit
Admission: RE | Admit: 2019-10-31 | Discharge: 2019-10-31 | Disposition: A | Discharge status: Home / Self Care | Payer: Federal, State, Local not specified - PPO | Source: Ambulatory Visit | Attending: Internal Medicine | Admitting: Internal Medicine

## 2019-11-25 ENCOUNTER — Other Ambulatory Visit: Payer: Self-pay | Admitting: Cardiovascular Disease

## 2019-12-04 ENCOUNTER — Other Ambulatory Visit: Payer: Self-pay | Admitting: Internal Medicine

## 2019-12-19 ENCOUNTER — Ambulatory Visit (INDEPENDENT_AMBULATORY_CARE_PROVIDER_SITE_OTHER): Admitting: Internal Medicine

## 2019-12-19 ENCOUNTER — Other Ambulatory Visit: Payer: Self-pay

## 2019-12-19 ENCOUNTER — Encounter: Payer: Self-pay | Admitting: Internal Medicine

## 2019-12-19 DIAGNOSIS — S43431S Superior glenoid labrum lesion of right shoulder, sequela: Secondary | ICD-10-CM

## 2019-12-19 DIAGNOSIS — K219 Gastro-esophageal reflux disease without esophagitis: Secondary | ICD-10-CM | POA: Diagnosis not present

## 2019-12-19 DIAGNOSIS — M546 Pain in thoracic spine: Secondary | ICD-10-CM | POA: Insufficient documentation

## 2019-12-19 DIAGNOSIS — S39012A Strain of muscle, fascia and tendon of lower back, initial encounter: Secondary | ICD-10-CM | POA: Insufficient documentation

## 2019-12-19 DIAGNOSIS — S139XXA Sprain of joints and ligaments of unspecified parts of neck, initial encounter: Secondary | ICD-10-CM | POA: Insufficient documentation

## 2019-12-19 DIAGNOSIS — E559 Vitamin D deficiency, unspecified: Secondary | ICD-10-CM | POA: Diagnosis not present

## 2019-12-19 MED ORDER — OXYCODONE-ACETAMINOPHEN 5-325 MG PO TABS: 1.0000 | ORAL_TABLET | Freq: Four times a day (QID) | ORAL | 0 refills | Status: DC | PRN

## 2019-12-19 NOTE — Assessment & Plan Note (Signed)
X ray

## 2019-12-19 NOTE — Assessment & Plan Note (Signed)
Vit D 

## 2019-12-19 NOTE — Assessment & Plan Note (Signed)
Worse. Pain is up to 10/10. Start Percocet Cont low dose Gabapentin, Flexeril prn

## 2019-12-19 NOTE — Progress Notes (Signed)
Subjective:  Patient ID: Karina Newman, female    DOB: Jun 23, 1956  Age: 64 y.o. MRN: [id number]  CC: No chief complaint on file.   HPI Karina Newman presents for severe spasms and pain in the R shoulder. Gabapentin is not working. Gabapentin is making her forgetful at higher dose. She was taking more ASA - developed abd pain - will see Dr Havery Moros  Outpatient Medications Prior to Visit  Medication Sig Dispense Refill  . amLODipine (NORVASC) 5 MG tablet Take 1 tablet (5 mg total) by mouth daily. NEED OFFICE VISIT FOR FUTURE REFILL. 30 tablet 1  . Aspirin-Caffeine (BAYER BACK & BODY PO) Take by mouth as needed.    . benzonatate (TESSALON) 200 MG capsule Take 1 capsule (200 mg total) by mouth 3 (three) times daily as needed for cough. 30 capsule 1  . clindamycin (CLINDAGEL) 1 % gel APPLY TO AFFECTED AREA TWICE A DAY 30 g 0  . Cyanocobalamin (VITAMIN B-12) 500 MCG SUBL 1 sl qd 100 tablet 5  . cyclobenzaprine (FLEXERIL) 5 MG tablet Take 1 tablet (5 mg total) by mouth 3 (three) times daily as needed for muscle spasms. 30 tablet 1  . Diclofenac Sodium (PENNSAID) 2 % SOLN Place 1 application onto the skin 4 (four) times daily as needed (arthritis). 112 g 5  . dicyclomine (BENTYL) 10 MG capsule TAKE 1 CAPSULE (10 MG TOTAL) BY MOUTH EVERY 8 (EIGHT) HOURS AS NEEDED FOR SPASMS. 60 capsule 2  . doxazosin (CARDURA) 4 MG tablet TAKE 1 TABLET BY MOUTH EVERY DAY 90 tablet 2  . DULoxetine (CYMBALTA) 30 MG capsule Take 1 capsule (30 mg total) by mouth daily. 30 capsule 3  . fluticasone (FLONASE) 50 MCG/ACT nasal spray PLACE 1 SPRAY INTO BOTH NOSTRILS AS NEEDED. 16 g 11  . gabapentin (NEURONTIN) 100 MG capsule TAKE 1 CAPSULE BY MOUTH THREE TIMES A DAY AS NEEDED 90 capsule 3  . omeprazole (PRILOSEC) 40 MG capsule TAKE 1 CAPSULE BY MOUTH EVERY DAY 90 capsule 3  . potassium chloride (K-DUR) 10 MEQ tablet TAKE 1 TABLET BY MOUTH EVERY DAY 30 tablet 5  . sucralfate (CARAFATE) 1 g tablet TAKE 1 TABLET BY MOUTH 3  TIMES A DAY BETWEEN MEALS 90 tablet 11  . telmisartan-hydrochlorothiazide (MICARDIS HCT) 80-25 MG tablet Take 1 tablet by mouth daily. 90 tablet 1  . valACYclovir (VALTREX) 500 MG tablet TAKE 1 TABLET (500 MG TOTAL) BY MOUTH 2 (TWO) TIMES DAILY. FOR 5 DAYS WITH OUTBREAK 30 tablet 1  . Vitamin D, Cholecalciferol, 1000 units CAPS Take 2,000 Units by mouth.    . carvedilol (COREG) 12.5 MG tablet Take 1 tablet (12.5 mg total) by mouth 2 (two) times daily. 180 tablet 0   No facility-administered medications prior to visit.    ROS: Review of Systems  Constitutional: Negative for activity change, appetite change, chills, fatigue and unexpected weight change.  HENT: Negative for congestion, mouth sores and sinus pressure.   Eyes: Negative for visual disturbance.  Respiratory: Negative for cough and chest tightness.   Gastrointestinal: Positive for abdominal pain. Negative for nausea.  Genitourinary: Negative for difficulty urinating, frequency and vaginal pain.  Musculoskeletal: Positive for arthralgias. Negative for back pain and gait problem.  Skin: Negative for pallor and rash.  Neurological: Negative for dizziness, tremors, weakness, numbness and headaches.  Psychiatric/Behavioral: Positive for decreased concentration. Negative for confusion and sleep disturbance.    Objective:  BP 128/82 (BP Location: Left Arm, Patient Position: Sitting, Cuff Size: Large)  Pulse 71   Temp 98.3 F (36.8 C) (Oral)   Ht 5\' 4"  (1.626 m)   Wt 178 lb (80.7 kg)   SpO2 98%   BMI 30.55 kg/m   BP Readings from Last 3 Encounters:  12/19/19 128/82  10/24/19 126/80  10/18/19 122/76    Wt Readings from Last 3 Encounters:  12/19/19 178 lb (80.7 kg)  10/24/19 181 lb (82.1 kg)  09/27/19 179 lb (81.2 kg)    Physical Exam Constitutional:      General: She is not in acute distress.    Appearance: She is well-developed.  HENT:     Head: Normocephalic.     Right Ear: External ear normal.     Left Ear:  External ear normal.     Nose: Nose normal.  Eyes:     General:        Right eye: No discharge.        Left eye: No discharge.     Conjunctiva/sclera: Conjunctivae normal.     Pupils: Pupils are equal, round, and reactive to light.  Neck:     Thyroid: No thyromegaly.     Vascular: No JVD.     Trachea: No tracheal deviation.  Cardiovascular:     Rate and Rhythm: Normal rate and regular rhythm.     Heart sounds: Normal heart sounds.  Pulmonary:     Effort: No respiratory distress.     Breath sounds: No stridor. No wheezing.  Abdominal:     General: Bowel sounds are normal. There is no distension.     Palpations: Abdomen is soft. There is no mass.     Tenderness: There is no abdominal tenderness. There is no guarding or rebound.  Musculoskeletal:        General: No tenderness.     Cervical back: Normal range of motion and neck supple.  Lymphadenopathy:     Cervical: No cervical adenopathy.  Skin:    Findings: No erythema or rash.  Neurological:     Cranial Nerves: No cranial nerve deficit.     Motor: No abnormal muscle tone.     Coordination: Coordination normal.     Deep Tendon Reflexes: Reflexes normal.  Psychiatric:        Behavior: Behavior normal.        Thought Content: Thought content normal.        Judgment: Judgment normal.     Lab Results  Component Value Date   WBC 6.2 07/26/2019   HGB 13.8 07/26/2019   HCT 40.6 07/26/2019   PLT 398.0 07/26/2019   GLUCOSE 105 (H) 07/26/2019   CHOL 188 07/26/2019   TRIG 120.0 07/26/2019   HDL 40.90 07/26/2019   LDLCALC 123 (H) 07/26/2019   ALT 27 07/26/2019   AST 18 07/26/2019   NA 137 07/26/2019   K 4.0 07/26/2019   CL 98 07/26/2019   CREATININE 0.84 07/26/2019   BUN 14 07/26/2019   CO2 29 07/26/2019   TSH 0.39 07/26/2019    US THYROID  Result Date: 10/31/2019 CLINICAL DATA:  Goiter EXAM: THYROID ULTRASOUND TECHNIQUE: Ultrasound examination of the thyroid gland and adjacent soft tissues was performed.  COMPARISON:  None. FINDINGS: Parenchymal Echotexture: Mildly heterogenous Isthmus: 0.5 cm Right lobe: 5.3 x 1.5 x 2.2 cm Left lobe: 5.2 x 1.5 x 2.4 cm _________________________________________________________ Estimated total number of nodules >/= 1 cm: 2 Number of spongiform nodules >/=  2 cm not described below (TR1): 0 Number of mixed cystic and solid nodules >/=  1.5 cm not described below ([place]): 0 _________________________________________________________ Nodule # 1: Location: Right; Inferior Maximum size: 1.1 cm; Other 2 dimensions: 1.1 x 0.7 cm Composition: solid/almost completely solid (2) Echogenicity: isoechoic (1) Shape: not taller-than-wide (0) Margins: ill-defined (0) Echogenic foci: none (0) ACR TI-RADS total points: 3. ACR TI-RADS risk category: TR3 (3 points). ACR TI-RADS recommendations: Given size (<1.4 cm) and appearance, this nodule does NOT meet TI-RADS criteria for biopsy or dedicated follow-up. _________________________________________________________ Nodule # 2: Location: Left; Inferior Maximum size: 1.2 cm; Other 2 dimensions: 0.9 x 0.6 cm Composition: solid/almost completely solid (2) 6 Echogenicity: hypoechoic (2) Shape: not taller-than-wide (0) Margins: ill-defined (0) Echogenic foci: none (0) ACR TI-RADS total points: 4. ACR TI-RADS risk category: TR4 (4-6 points). ACR TI-RADS recommendations: *Given size (>/= 1 - 1.4 cm) and appearance, a follow-up ultrasound in 1 year should be considered based on TI-RADS criteria. _________________________________________________________ There are few prominent but subcentimeter cervical lymph nodes. IMPRESSION: 1. Mildly enlarged, mildly heterogeneous thyroid gland as detailed above. 2. There is a 1.2 cm TR 4 thyroid nodule in the left inferior thyroid gland. A 1 year follow-up ultrasound is recommended for this thyroid nodule. 3. There is a 1.1 cm TR 3 thyroid nodule in the right inferior thyroid gland. No further follow-up is required for this thyroid  nodule. The above is in keeping with the ACR TI-RADS recommendations - J Am Coll Radiol 2017;14:587-595. Electronically Signed   By: Constance Holster M.D.   On: 10/31/2019 19:26    Assessment & Plan:   There are no diagnoses linked to this encounter.   No orders of the defined types were placed in this encounter.    Follow-up: No follow-ups on file.  Walker Kehr, MD

## 2019-12-19 NOTE — Assessment & Plan Note (Signed)
She was taking more ASA for pain - developed abd pain - will see Dr Havery Moros

## 2019-12-26 ENCOUNTER — Other Ambulatory Visit: Payer: Self-pay | Admitting: Cardiovascular Disease

## 2019-12-26 NOTE — Progress Notes (Signed)
Office Visit Note  Patient: Karina Newman             Date of Birth: Mar 22, 1956           MRN: [id number]             PCP: Cassandria Anger, MD Referring: Cassandria Anger, MD Visit Date: 01/03/2020 Occupation: @GUAROCC @  Subjective:  Left knee joint pain   History of Present Illness: Karina Newman is a 64 y.o. female with history of osteoarthritis and DDD.  Patient presents today with pain in both knee joints especially the left knee.  She notices intermittent inflammation in the left knee joint.  She has significant discomfort when climbing steps.  She states that the left knee cortisone injection performed on 08/03/2019 provided significant relief and resolved her symptoms for several months.  The discomfort has returned and she would like a cortisone injection today.  She will be going on vacation for about 1 month and will be walking a lot at that time.  She states that she continues to have trochanter bursitis of the left hip.  She has intermittent trapezius muscle tension muscle tenderness bilaterally.  Overall her neck pain has improved.  Activities of Daily Living:  Patient reports morning stiffness for 10 minutes.   Patient Denies nocturnal pain.  Difficulty dressing/grooming: Denies Difficulty climbing stairs: Reports Difficulty getting out of chair: Denies Difficulty using hands for taps, buttons, cutlery, and/or writing: Reports  Review of Systems  Constitutional: Negative for fatigue.  HENT: Positive for mouth dryness. Negative for mouth sores and nose dryness.   Eyes: Positive for dryness. Negative for pain and visual disturbance.  Respiratory: Negative for cough, hemoptysis, shortness of breath and difficulty breathing.   Cardiovascular: Negative for chest pain, palpitations and hypertension.  Gastrointestinal: Negative for blood in stool, constipation and diarrhea.  Endocrine: Negative for increased urination.  Genitourinary: Negative for difficulty  urinating and painful urination.  Musculoskeletal: Positive for arthralgias, joint pain and morning stiffness. Negative for joint swelling, myalgias, muscle weakness, muscle tenderness and myalgias.  Skin: Negative for color change, pallor, rash, hair loss, nodules/bumps, skin tightness, ulcers and sensitivity to sunlight.  Allergic/Immunologic: Negative for susceptible to infections.  Neurological: Negative for dizziness, numbness, headaches and weakness.  Hematological: Positive for bruising/bleeding tendency. Negative for swollen glands.  Psychiatric/Behavioral: Positive for sleep disturbance. Negative for depressed mood. The patient is not nervous/anxious.     PMFS History:  Patient Active Problem List   Diagnosis Date Noted  . Dysphagia 01/02/2020  . Globus sensation 01/02/2020  . Thoracic back pain 12/19/2019  . Sore throat 10/24/2019  . Goiter 10/24/2019  . Cough 10/24/2019  . Neoplasm of uncertain behavior of skin 09/27/2019  . Pain of right breast 06/03/2019  . Fatty liver 01/23/2019  . Hand pain 09/28/2018  . Knee pain 09/28/2018  . Inappropriate sinus tachycardia 05/17/2018  . Apnea 05/17/2018  . Upper respiratory infection 09/23/2017  . Fatigue 09/12/2017  . Well adult exam 01/19/2017  . Abdominal pain 01/19/2017  . Headache 10/27/2016  . Pain in right shoulder 09/16/2016  . Left hip pain 09/16/2016  . Low vitamin B12 level 03/16/2016  . Piriformis syndrome of right side 02/04/2016  . IT band syndrome 02/04/2016  . RUQ abdominal pain 02/04/2016  . Low back pain radiating to right lower extremity 09/19/2015  . Allergic rhinitis 09/19/2015  . Atypical chest pain 04/07/2015  . GERD (gastroesophageal reflux disease) 04/07/2015  . Axillary adenitis 03/23/2015  . Migraine headache  03/23/2015  . Hives 01/29/2015  . Edema 01/29/2015  . Cervical disc disorder with radiculopathy of cervical region 12/11/2014  . Insomnia 07/31/2014  . Degenerative cervical disc  05/08/2014  . Pain in joint, shoulder region 03/05/2014  . Patellofemoral arthralgia of both knees 03/01/2014  . Bilateral shoulder pain 10/18/2013  . Sinusitis, bacterial 08/20/2013  . URI, acute 08/14/2013  . Hemoptysis 08/14/2013  . Otitis media of left ear 08/14/2013  . Labral tear of shoulder 07/03/2013  . Hyperthyroidism 07/03/2013  . HTN (hypertension), benign 07/03/2013  . LVH (left ventricular hypertrophy) due to hypertensive disease 07/03/2013  . Vitamin D deficiency 07/03/2013    Past Medical History:  Diagnosis Date  . Abnormal cervical Pap smear with positive HPV DNA test 01/2014,01/2017   2015 Normal cytology with positive high-risk HPV 18/45. Colposcopy negative with negative ECC.  2018 normal cytology with positive high-risk HPV 18/45  . Anemia   . Apnea 05/17/2018  . Autoimmune gastritis   . Fatty liver   . Gastritis   . Heart murmur   . Hypertension   . Hyperthyroidism   . Inappropriate sinus tachycardia 05/17/2018  . Internal hemorrhoids   . Osteoarthritis   . Right shoulder pain 2001   chronic pain  . Thyroid disease    Hyperthyroid. Was on PTU (Dr Hampton Abbot in [place]) - stopped in 2013, stable  . Tubular adenoma of colon     Family History  Problem Relation Age of Onset  . Kidney disease Mother        ESRD  . Hypertension Mother   . Cancer Father        lung ca  . Hypertension Sister   . Kidney disease Sister   . Diabetes Brother   . Hypertension Brother   . Diabetes Paternal Grandmother   . Hypertension Sister   . Hypertension Brother   . Sleep apnea Son   . Hypertension Son   . Colon cancer Neg Hx   . Stomach cancer Neg Hx   . Esophageal cancer Neg Hx   . Rectal cancer Neg Hx   . Pancreatic cancer Neg Hx    Past Surgical History:  Procedure Laterality Date  . BREAST CYST ASPIRATION Left 2012  . CESAREAN SECTION     x 4   . HAND SURGERY Left   . SHOULDER SURGERY Right 2002  . SHOULDER SURGERY Left 2014  . TUBAL LIGATION     Social History    Social History Narrative  . Not on file   Immunization History  Administered Date(s) Administered  . Zoster Recombinat (Shingrix) 03/25/2017, 06/27/2017     Objective: Vital Signs: BP 124/76 (BP Location: Left Arm, Patient Position: Sitting, Cuff Size: Normal)   Pulse 95   Resp 14   Ht 5\' 4"  (1.626 m)   Wt 176 lb 6.4 oz (80 kg)   BMI 30.28 kg/m    Physical Exam Vitals and nursing note reviewed.  Constitutional:      Appearance: She is well-developed.  HENT:     Head: Normocephalic and atraumatic.  Eyes:     Conjunctiva/sclera: Conjunctivae normal.  Pulmonary:     Effort: Pulmonary effort is normal.  Abdominal:     General: Bowel sounds are normal.     Palpations: Abdomen is soft.  Musculoskeletal:     Cervical back: Normal range of motion.  Lymphadenopathy:     Cervical: No cervical adenopathy.  Skin:    General: Skin is warm and dry.  Capillary Refill: Capillary refill takes less than 2 seconds.  Neurological:     Mental Status: She is alert and oriented to person, place, and time.  Psychiatric:        Behavior: Behavior normal.      Musculoskeletal Exam: C-spine, thoracic spine, and lumbar spine good ROM.  Shoulder joints, elbow joints, wrist joints, MCPs, PIPs, and DIPs good ROM with no synovitis.  Complete fist formation bilaterally.  Hip joints good ROM with no discomfort.  Painful ROM of the left knee joint.  Limited extension with warmth and inflammation in the left knee joint.  Right knee joint has good ROM with no warmth or effusion.  Ankle joints good ROM with no tenderness or inflammation.   CDAI Exam: CDAI Score: -- Patient Global: --; Provider Global: -- Swollen: --; Tender: -- Joint Exam 01/03/2020   No joint exam has been documented for this visit   There is currently no information documented on the homunculus. Go to the Rheumatology activity and complete the homunculus joint exam.  Investigation: No additional findings.  Imaging: No  results found.  Recent Labs: Lab Results  Component Value Date   WBC 6.2 07/26/2019   HGB 13.8 07/26/2019   PLT 398.0 07/26/2019   NA 137 07/26/2019   K 4.0 07/26/2019   CL 98 07/26/2019   CO2 29 07/26/2019   GLUCOSE 105 (H) 07/26/2019   BUN 14 07/26/2019   CREATININE 0.84 07/26/2019   BILITOT 0.5 07/26/2019   ALKPHOS 77 07/26/2019   AST 18 07/26/2019   ALT 27 07/26/2019   PROT 8.4 (H) 07/26/2019   ALBUMIN 4.5 07/26/2019   CALCIUM 9.7 07/26/2019   GFRAA 78 06/20/2018    Speciality Comments: No specialty comments available.  Procedures:  Large Joint Inj: L knee on 01/03/2020 1:48 PM Indications: pain Details: 27 G 1.5 in needle, medial approach  Arthrogram: No  Medications: 1.5 mL lidocaine 1 %; 40 mg triamcinolone acetonide 40 MG/ML Aspirate: 0 mL Outcome: tolerated well, no immediate complications Procedure, treatment alternatives, risks and benefits explained, specific risks discussed. Consent was given by the patient. Immediately prior to procedure a time out was called to verify the correct patient, procedure, equipment, support staff and site/side marked as required. Patient was prepped and draped in the usual sterile fashion.     Allergies: Penicillins, Tramadol, Gabapentin, Iodine, Lyrica [pregabalin], Nsaids, and Voltaren [diclofenac]   Assessment / Plan:     Visit Diagnoses: Primary osteoarthritis of both knees - She has moderate osteoarthritis and moderate chondromalacia patella bilaterally: She presents today with pain in both knee joints, especially the left knee joint.  She has warmth and inflammation of the left knee joint on exam.  Limited extension and painful ROM of the left knee joint noted.  She had a cortisone injection performed on 08/03/2019 which provided significant pain relief but her discomfort has returned.  The left knee joint pain is exacerbated by climbing steps.  She has an upcoming trip to [place] and will be walking a lot and is concerned  about her left knee joint.  She requested a cortisone injection today.  She tolerated the procedure well.  The procedure note was completed above. Aftercare was discussed. She plans on continuing to take oxycodone for pain relief.  She continues to take gabapentin and cymbalta as prescribed.  She can apply pennsaid topically as needed. She was advised to notify us if her discomfort persists or worsens. She was advised to notify us if she  would like to apply for visco gel injections. She will follow up in 6 months.   Cervical disc disorder with radiculopathy of cervical region: She has good ROM with no discomfort.  She has intermittent trapezius muscle tension and tenderness bilaterally.  She uses pennsaid topically as needed for pain relief.  Other insomnia: She experiences nocturnal pain in the left knee joint, which causes interrupted sleep at night.   Trochanteric bursitis of left hip: She has tenderness upon palpation of the left trochanteric bursa. She was encouraged to perform stretching exercises daily.    Vitamin D deficiency  Hypertensive left ventricular hypertrophy, without heart failure  HTN (hypertension), benign  Fatty liver  History of gastroesophageal reflux (GERD)  Hyperthyroidism  Tubular adenoma of colon  Orders: Orders Placed This Encounter  Procedures  . Large Joint Inj   No orders of the defined types were placed in this encounter.    Follow-Up Instructions: Return in about 6 months (around 07/05/2020) for Osteoarthritis.   Ofilia Neas, PA-C  Note - This record has been created using Dragon software.  Chart creation errors have been sought, but may not always  have been located. Such creation errors do not reflect on  the standard of medical care.

## 2019-12-27 ENCOUNTER — Telehealth: Payer: Self-pay | Admitting: Internal Medicine

## 2019-12-27 MED ORDER — OXYCODONE-ACETAMINOPHEN 5-325 MG PO TABS: 1.0000 | ORAL_TABLET | Freq: Two times a day (BID) | ORAL | 0 refills | Status: DC | PRN

## 2019-12-27 NOTE — Telephone Encounter (Signed)
    1.Medication Requested: oxyCODONE-acetaminophen (PERCOCET/ROXICET) 5-325 MG tablet  2. Pharmacy (Name, Street, City):[id number] - , [address]  3. On Med List: yes  4. Last Visit with PCP: 12/19/19  5. Next visit date with PCP:01/16/20  Agent: Please be advised that RX refills may take up to 3 business days. We ask that you follow-up with your pharmacy.

## 2019-12-27 NOTE — Telephone Encounter (Signed)
Ok new Rx

## 2019-12-27 NOTE — Telephone Encounter (Signed)
Please advise, 5 day supply filled on 12/19/19

## 2020-01-02 ENCOUNTER — Ambulatory Visit: Payer: Federal, State, Local not specified - PPO | Admitting: Gastroenterology

## 2020-01-02 ENCOUNTER — Encounter: Payer: Self-pay | Admitting: Gastroenterology

## 2020-01-02 ENCOUNTER — Telehealth: Payer: Self-pay

## 2020-01-02 VITALS — BP 130/81 | HR 74 | Ht 64.0 in | Wt 177.2 lb

## 2020-01-02 DIAGNOSIS — R1310 Dysphagia, unspecified: Secondary | ICD-10-CM | POA: Diagnosis not present

## 2020-01-02 DIAGNOSIS — R0989 Other specified symptoms and signs involving the circulatory and respiratory systems: Secondary | ICD-10-CM

## 2020-01-02 DIAGNOSIS — K219 Gastro-esophageal reflux disease without esophagitis: Secondary | ICD-10-CM

## 2020-01-02 MED ORDER — OMEPRAZOLE 40 MG PO CPDR: 40.0000 mg | DELAYED_RELEASE_CAPSULE | Freq: Two times a day (BID) | ORAL | 3 refills | Status: DC

## 2020-01-02 NOTE — Telephone Encounter (Signed)
Author: Loralie Champagne, PA-C Service: Gastroenterology Author Type: Physician Assistant  Filed: 01/02/2020 4:50 PM Encounter Date: 01/02/2020 Status: Signed  Editor: Zehr, Laban Emperor, PA-C (Physician Assistant)     Show:Clear all [x] Manual[] Template[] Copied  Added by: [x] Loralie Champagne, PA-C  [] Hover for details Please contact the patient and let her know that this was discussed with Dr. Havery Moros.  He does not think that she needs another EGD and that we are not missing anything serious as her last two did not show any problems in her esophagus.  He would like [place] to cancel the EGD.  He would like her to try the medication changes that we discussed today with the omeprazole 40 mg twice daily and the Carafate suspension (liquids) 3 times a day.  If she fails to improve with these regimens then the next step he would like to take would be to do a 24-hour pH study while on her acid reducing medication as well as an esophageal manometry study performed at the same time.  Need to give these medication changes time to see if they help so would likely wait to proceed with the studies when she returns from seeing her family in [place] if she continues to have symptoms  Thank you,  Keane Scrape

## 2020-01-02 NOTE — Patient Instructions (Addendum)
If you are age 63 or older, your body mass index should be between 23-30. Your Body mass index is 30.42 kg/m. If this is out of the aforementioned range listed, please consider follow up with your Primary Care Provider.  If you are age 25 or younger, your body mass index should be between 19-25. Your Body mass index is 30.42 kg/m. If this is out of the aformentioned range listed, please consider follow up with your Primary Care Provider.   We have sent the following medications to your pharmacy for you to pick up at your convenience: Omeprazole 40 mg twice daily.   Dissolve Carafate tablet in water and drink three times daily between meals.  Continue Florastor probiotic daily.   You have been scheduled for an endoscopy. Please follow written instructions given to you at your visit today. If you use inhalers (even only as needed), please bring them with you on the day of your procedure.  Due to recent changes in healthcare laws, you may see the results of your imaging and laboratory studies on MyChart before your provider has had a chance to review them.  We understand that in some cases there may be results that are confusing or concerning to you. Not all laboratory results come back in the same time frame and the provider may be waiting for multiple results in order to interpret others.  Please give [place] 48 hours in order for your provider to thoroughly review all the results before contacting the office for clarification of your results.

## 2020-01-02 NOTE — Telephone Encounter (Signed)
-----   Message from Loralie Champagne, PA-C sent at 01/02/2020  4:47 PM EDT -----   ----- Message ----- From: Yetta Flock, MD Sent: 01/02/2020   1:12 PM EDT To: Loralie Champagne, PA-C    ----- Message ----- From: Loralie Champagne, PA-C Sent: 01/02/2020   1:08 PM EDT To: Yetta Flock, MD

## 2020-01-02 NOTE — Progress Notes (Signed)
01/02/2020 Karina Newman [id number] 1956-01-27   HISTORY OF PRESENT ILLNESS: This is a 64 year old female who follows with Dr. Havery Moros for history of autoimmune gastritis, reflux, and upper abdominal discomfort.  She presents here today with complaints of difficulty/painful swallowing as well as what sounds like globus sensation.  She says that when she is eating she feels like something is in her esophagus.  Says it has been related bothersome over the past 4 months or so.  She tries to chew her food really well.  She says that when she swallows her pills they seem to irritate the area.  She is on omeprazole 40 mg daily and has been taking that in the evening after dinner.  She also takes Carafate tablets 3 times daily.  She recently saw ENT with report as follows:  Flexible laryngoscopy performed today shows no evidence of mass or lesion, patient has significant reflux changes in the posterior glottis consistent with her history.   sensation of globus and fullness in the throat. She underwent ultrasound on 10/31/2019 which showed small bilateral nodules consistent with multinodular goiter largest was 1.2 cm on the left, no biopsy recommended.   Previous work up: Workup as follows: CT scan abdomenfor RLQ pain -09/09/16, oral contrast only - hepatic steatosis,smallinguinal hernias,smallumbilical hernia EGD [id number] - normal EGD, biopsies taken for H pylori - showing "marked chronic gastritis" , negative for HP Colonoscopy 05/06/16 - normal colon and ileum, small adenoma, hemorrhoids. Recall colonoscopy in 5 years. [place] 03/04/16 - no gallstones, fatty liver EGD 05/2018 showed 1 cm hiatal hernia and mildly atrophic gastritis; gastric biopsies c/w autoimmune gastritis.  Repeat EGD recommended in 3-5 years.  Labs show negative IF AB, positive parietal cell AB, and B12 deficiency. Diagnosed with autoimmune gastritis.  She says that she is leaving on June 20 to go to [place]  to visit her children and grandchildren.  She goes for long periods of time, unsure when she will return.   Past Medical History:  Diagnosis Date  . Abnormal cervical Pap smear with positive HPV DNA test 01/2014,01/2017   2015 Normal cytology with positive high-risk HPV 18/45. Colposcopy negative with negative ECC.  2018 normal cytology with positive high-risk HPV 18/45  . Anemia   . Apnea 05/17/2018  . Autoimmune gastritis   . Fatty liver   . Gastritis   . Heart murmur   . Hypertension   . Hyperthyroidism   . Inappropriate sinus tachycardia 05/17/2018  . Internal hemorrhoids   . Right shoulder pain 2001   chronic pain  . Thyroid disease    Hyperthyroid. Was on PTU (Dr Hampton Abbot in [place]) - stopped in 2013, stable  . Tubular adenoma of colon    Past Surgical History:  Procedure Laterality Date  . BREAST CYST ASPIRATION Left 2012  . CESAREAN SECTION     x 4   . HAND SURGERY Left   . SHOULDER SURGERY Right 2002  . SHOULDER SURGERY Left 2014  . TUBAL LIGATION      reports that she has never smoked. She has never used smokeless tobacco. She reports that she does not drink alcohol or use drugs. family history includes Cancer in her father; Diabetes in her brother and paternal grandmother; Hypertension in her brother, brother, mother, sister, sister, and son; Kidney disease in her mother and sister; Sleep apnea in her son. Allergies  Allergen Reactions  . Penicillins Hives  . Gabapentin     Nausea   .  Iodine Other (See Comments)    Due to hyperthyroidism  . Lyrica [Pregabalin] Dermatitis  . Nsaids     gastritis  . Tramadol     Memory lapses  . Voltaren [Diclofenac] Hives      Outpatient Encounter Medications as of 01/02/2020  Medication Sig  . amLODipine (NORVASC) 5 MG tablet TAKE 1 TABLET (5 MG TOTAL) BY MOUTH DAILY. NEED OFFICE VISIT FOR FUTURE REFILL.  Marland Kitchen Aspirin-Caffeine (BAYER BACK & BODY PO) Take by mouth as needed.  . benzonatate (TESSALON) 200 MG capsule Take 1 capsule  (200 mg total) by mouth 3 (three) times daily as needed for cough.  . clindamycin (CLINDAGEL) 1 % gel APPLY TO AFFECTED AREA TWICE A DAY  . Cyanocobalamin (VITAMIN B-12) 500 MCG SUBL 1 sl qd  . cyclobenzaprine (FLEXERIL) 5 MG tablet Take 1 tablet (5 mg total) by mouth 3 (three) times daily as needed for muscle spasms.  . Diclofenac Sodium (PENNSAID) 2 % SOLN Place 1 application onto the skin 4 (four) times daily as needed (arthritis).  Marland Kitchen dicyclomine (BENTYL) 10 MG capsule TAKE 1 CAPSULE (10 MG TOTAL) BY MOUTH EVERY 8 (EIGHT) HOURS AS NEEDED FOR SPASMS.  Marland Kitchen doxazosin (CARDURA) 4 MG tablet TAKE 1 TABLET BY MOUTH EVERY DAY  . DULoxetine (CYMBALTA) 30 MG capsule Take 1 capsule (30 mg total) by mouth daily.  . fluticasone (FLONASE) 50 MCG/ACT nasal spray PLACE 1 SPRAY INTO BOTH NOSTRILS AS NEEDED.  Marland Kitchen gabapentin (NEURONTIN) 100 MG capsule TAKE 1 CAPSULE BY MOUTH THREE TIMES A DAY AS NEEDED  . omeprazole (PRILOSEC) 40 MG capsule TAKE 1 CAPSULE BY MOUTH EVERY DAY  . oxyCODONE-acetaminophen (PERCOCET/ROXICET) 5-325 MG tablet Take 1 tablet by mouth 2 (two) times daily as needed for severe pain.  . potassium chloride (K-DUR) 10 MEQ tablet TAKE 1 TABLET BY MOUTH EVERY DAY  . sucralfate (CARAFATE) 1 g tablet TAKE 1 TABLET BY MOUTH 3 TIMES A DAY BETWEEN MEALS  . telmisartan-hydrochlorothiazide (MICARDIS HCT) 80-25 MG tablet Take 1 tablet by mouth daily.  . valACYclovir (VALTREX) 500 MG tablet TAKE 1 TABLET (500 MG TOTAL) BY MOUTH 2 (TWO) TIMES DAILY. FOR 5 DAYS WITH OUTBREAK  . Vitamin D, Cholecalciferol, 1000 units CAPS Take 2,000 Units by mouth.  . carvedilol (COREG) 12.5 MG tablet Take 1 tablet (12.5 mg total) by mouth 2 (two) times daily.   No facility-administered encounter medications on file as of 01/02/2020.     REVIEW OF SYSTEMS  : All other systems reviewed and negative except where noted in the History of Present Illness.   PHYSICAL EXAM: BP 130/81   Pulse 74   Ht 5\' 4"  (1.626 m)   Wt 177 lb  4 oz (80.4 kg)   BMI 30.42 kg/m  General: Well developed AA female in no acute distress Head: Normocephalic and atraumatic Eyes:  Sclerae anicteric, conjunctiva pink. Ears: Normal auditory acuity Lungs: Clear throughout to auscultation; no increased WOB. Heart: Regular rate and rhythm; no M/R/G. Abdomen: Soft, non-distended.  BS present.  Some epigastric TTP. Musculoskeletal: Symmetrical with no gross deformities  Skin: No lesions on visible extremities Extremities: No edema  Neurological: Alert oriented x 4, grossly non-focal Psychological:  Alert and cooperative. Normal mood and affect  ASSESSMENT AND PLAN: *64 year old female with complaints of GERD, dysphagia/odynophagia, and what sounds like globus sensation.  Just had EGD in October 2019.  Recently had ENT evaluation and thyroid ultrasound that proved unremarkable.  I'm going to increase her omeprazole to 40 mg twice  daily.  She has been taking Carafate tablets, but I asked her to dissolve this in liquid and take it in suspension form 3 times daily instead as this will help better with the esophagus.  Discussed trying this regimen for the next 4 weeks or so to see how she does before planning for another endoscopic evaluation, but the patient tells me that she is leaving to go out of town on June 20 for an unknown period of time.  We'll plan to schedule procedure with Dr. Havery Moros for evaluation and she will try these medication changes as well.  New prescription for omeprazole was sent to the pharmacy.   CC:  Plotnikov, Evie Lacks, MD

## 2020-01-02 NOTE — Progress Notes (Signed)
Please contact the patient and let her know that this was discussed with Dr. Havery Moros.  He does not think that she needs another EGD and that we are not missing anything serious as her last two did not show any problems in her esophagus.  He would like [place] to cancel the EGD.  He would like her to try the medication changes that we discussed today with the omeprazole 40 mg twice daily and the Carafate suspension (liquids) 3 times a day.  If she fails to improve with these regimens then the next step he would like to take would be to do a 24-hour pH study while on her acid reducing medication as well as an esophageal manometry study performed at the same time.  Need to give these medication changes time to see if they help so would likely wait to proceed with the studies when she returns from seeing her family in [place] if she continues to have symptoms  Thank you,  Keane Scrape

## 2020-01-02 NOTE — Progress Notes (Signed)
Agree with assessment with the following thoughts:  This patient has had 2 EGDs without any significant abnormalities noted in recent years. ENT evaluation suggests reflux. I agree with increasing omeprazole to BID for now and trying the carafate. If no improvement the more helpful test at this point would be a 24 hour pH impedance testing done on PPI along with esophageal manometry, to see if she is truly having breakthrough acid reflux and assess motility. I would hold off on EGD at this time.  Jess, please see above, I would cancel EGD and consider pH / manometry testing if no improvement with changes that you have made. Thanks

## 2020-01-03 ENCOUNTER — Ambulatory Visit: Payer: Federal, State, Local not specified - PPO | Admitting: Physician Assistant

## 2020-01-03 ENCOUNTER — Encounter: Payer: Self-pay | Admitting: Physician Assistant

## 2020-01-03 ENCOUNTER — Other Ambulatory Visit: Payer: Self-pay

## 2020-01-03 VITALS — BP 124/76 | HR 95 | Resp 14 | Ht 64.0 in | Wt 176.4 lb

## 2020-01-03 DIAGNOSIS — G4709 Other insomnia: Secondary | ICD-10-CM | POA: Diagnosis not present

## 2020-01-03 DIAGNOSIS — I119 Hypertensive heart disease without heart failure: Secondary | ICD-10-CM

## 2020-01-03 DIAGNOSIS — M7062 Trochanteric bursitis, left hip: Secondary | ICD-10-CM | POA: Diagnosis not present

## 2020-01-03 DIAGNOSIS — G8929 Other chronic pain: Secondary | ICD-10-CM

## 2020-01-03 DIAGNOSIS — M170 Bilateral primary osteoarthritis of knee: Secondary | ICD-10-CM | POA: Diagnosis not present

## 2020-01-03 DIAGNOSIS — E559 Vitamin D deficiency, unspecified: Secondary | ICD-10-CM

## 2020-01-03 DIAGNOSIS — M5010 Cervical disc disorder with radiculopathy, unspecified cervical region: Secondary | ICD-10-CM | POA: Diagnosis not present

## 2020-01-03 DIAGNOSIS — I10 Essential (primary) hypertension: Secondary | ICD-10-CM

## 2020-01-03 DIAGNOSIS — K760 Fatty (change of) liver, not elsewhere classified: Secondary | ICD-10-CM

## 2020-01-03 DIAGNOSIS — D126 Benign neoplasm of colon, unspecified: Secondary | ICD-10-CM

## 2020-01-03 DIAGNOSIS — Z8719 Personal history of other diseases of the digestive system: Secondary | ICD-10-CM

## 2020-01-03 DIAGNOSIS — M25562 Pain in left knee: Secondary | ICD-10-CM

## 2020-01-03 DIAGNOSIS — E0590 Thyrotoxicosis, unspecified without thyrotoxic crisis or storm: Secondary | ICD-10-CM

## 2020-01-03 NOTE — Telephone Encounter (Signed)
Patient is returning your call.  

## 2020-01-03 NOTE — Telephone Encounter (Signed)
The pt has been advised and EGD cancelled.  She will call when she returns form her trip with an update.

## 2020-01-03 NOTE — Telephone Encounter (Signed)
Left message on machine to call back  

## 2020-01-04 ENCOUNTER — Other Ambulatory Visit: Payer: Self-pay | Admitting: Internal Medicine

## 2020-01-04 NOTE — Telephone Encounter (Signed)
Check [place] registry last filled 12/27/2019 7 day supply. MD is out of the office will hold until he return for refills../l,mb

## 2020-01-04 NOTE — Telephone Encounter (Signed)
The pharmacy advise patient MD would have to called in her oxyCODONE-acetaminophen (PERCOCET/ROXICET) 5-325 MG tablet due to refill    1.Medication Requested:oxyCODONE-acetaminophen (PERCOCET/ROXICET) 5-325 MG tablet  2. Pharmacy (Name, Street, City):[id number] [place], [place] [facility] [place]  3. On Med List: Yes   4. Last Visit with PCP: 5.5.21   5. Next visit date with PCP: 6.2.2021    Agent: Please be advised that RX refills may take up to 3 business days. We ask that you follow-up with your pharmacy.

## 2020-01-07 ENCOUNTER — Telehealth: Payer: Self-pay

## 2020-01-07 NOTE — Telephone Encounter (Signed)
New message    The MD put 4 refill on medication  oxyCODONE-acetaminophen (PERCOCET/ROXICET) 5-325 MG tablet the MD has to called it in   Upcoming appt on 6.2.2021  1.Medication Requested:oxyCODONE-acetaminophen (PERCOCET/ROXICET) 5-325 MG tablet  2. Pharmacy (Name, Street, City):[id number] [place], [place] - [place]  3. On Med List: Yes   4. Last Visit with PCP: 5.5.2021   5. Next visit date with PCP: 6.2.2021    Agent: Please be advised that RX refills may take up to 3 business days. We ask that you follow-up with your pharmacy.

## 2020-01-08 NOTE — Telephone Encounter (Signed)
Rx given on 5/13 - should last x 4 wks Thx

## 2020-01-09 NOTE — Telephone Encounter (Signed)
I called pharmacy- the pt's insurance only allows her to fill a 7 day supply. She filled #14 on 12/27/19. A new prescription is needed per pharmacy. Per pharmacy a new rx is needed every 7 days and patient is currently out of medication.

## 2020-01-11 MED ORDER — OXYCODONE-ACETAMINOPHEN 5-325 MG PO TABS: 1.0000 | ORAL_TABLET | Freq: Two times a day (BID) | ORAL | 0 refills | Status: DC | PRN

## 2020-01-11 NOTE — Telephone Encounter (Signed)
Noted.  Will email a prescription.  Thanks

## 2020-01-16 ENCOUNTER — Ambulatory Visit (INDEPENDENT_AMBULATORY_CARE_PROVIDER_SITE_OTHER): Payer: Federal, State, Local not specified - PPO | Admitting: Internal Medicine

## 2020-01-16 ENCOUNTER — Other Ambulatory Visit: Payer: Self-pay

## 2020-01-16 ENCOUNTER — Encounter: Payer: Self-pay | Admitting: Internal Medicine

## 2020-01-16 DIAGNOSIS — S43431S Superior glenoid labrum lesion of right shoulder, sequela: Secondary | ICD-10-CM | POA: Diagnosis not present

## 2020-01-16 DIAGNOSIS — I10 Essential (primary) hypertension: Secondary | ICD-10-CM

## 2020-01-16 DIAGNOSIS — E559 Vitamin D deficiency, unspecified: Secondary | ICD-10-CM

## 2020-01-16 MED ORDER — OXYCODONE-ACETAMINOPHEN 5-325 MG PO TABS: 1.0000 | ORAL_TABLET | Freq: Two times a day (BID) | ORAL | 0 refills | Status: DC | PRN

## 2020-01-16 NOTE — Assessment & Plan Note (Signed)
Amlodipine Micardis HCT 

## 2020-01-16 NOTE — Assessment & Plan Note (Signed)
Vit D 

## 2020-01-16 NOTE — Assessment & Plan Note (Signed)
Oxy bid Feeling more like normal herself, not as depressed because of pain. No negatve effects so far. I gave a 30 d Rx Oxy - pt got 7 d paid by [employer] - needs another Rx for 01/18/20

## 2020-01-16 NOTE — Progress Notes (Signed)
Subjective:  Patient ID: Karina Newman, female    DOB: 1956-01-21  Age: 64 y.o. MRN: [id number]  CC: No chief complaint on file.   HPI Karina Newman presents for chronic pain - better on current meds Feeling more like normal herself, not as depressed because of pain. No negatve effects so far. I gave a 30 d Rx Oxy - pt got 7 d paid by [employer] - needs another Rx for 01/18/20 F/u HTN, Vit d def  Outpatient Medications Prior to Visit  Medication Sig Dispense Refill  . amLODipine (NORVASC) 5 MG tablet TAKE 1 TABLET (5 MG TOTAL) BY MOUTH DAILY. NEED OFFICE VISIT FOR FUTURE REFILL. 30 tablet 6  . Aspirin-Caffeine (BAYER BACK & BODY PO) Take by mouth as needed.    . benzonatate (TESSALON) 200 MG capsule Take 1 capsule (200 mg total) by mouth 3 (three) times daily as needed for cough. 30 capsule 1  . clindamycin (CLINDAGEL) 1 % gel APPLY TO AFFECTED AREA TWICE A DAY 30 g 0  . Cyanocobalamin (VITAMIN B-12) 500 MCG SUBL 1 sl qd 100 tablet 5  . cyclobenzaprine (FLEXERIL) 5 MG tablet Take 1 tablet (5 mg total) by mouth 3 (three) times daily as needed for muscle spasms. 30 tablet 1  . Diclofenac Sodium (PENNSAID) 2 % SOLN Place 1 application onto the skin 4 (four) times daily as needed (arthritis). 112 g 5  . dicyclomine (BENTYL) 10 MG capsule TAKE 1 CAPSULE (10 MG TOTAL) BY MOUTH EVERY 8 (EIGHT) HOURS AS NEEDED FOR SPASMS. 60 capsule 2  . doxazosin (CARDURA) 4 MG tablet TAKE 1 TABLET BY MOUTH EVERY DAY 90 tablet 2  . DULoxetine (CYMBALTA) 30 MG capsule Take 1 capsule (30 mg total) by mouth daily. 30 capsule 3  . fluticasone (FLONASE) 50 MCG/ACT nasal spray PLACE 1 SPRAY INTO BOTH NOSTRILS AS NEEDED. 16 g 11  . gabapentin (NEURONTIN) 100 MG capsule TAKE 1 CAPSULE BY MOUTH THREE TIMES A DAY AS NEEDED 90 capsule 3  . omeprazole (PRILOSEC) 40 MG capsule Take 1 capsule (40 mg total) by mouth in the morning and at bedtime. 90 capsule 3  . oxyCODONE-acetaminophen (PERCOCET/ROXICET) 5-325 MG tablet  Take 1 tablet by mouth 2 (two) times daily as needed for severe pain. 60 tablet 0  . potassium chloride (KLOR-CON) 10 MEQ tablet TAKE 1 TABLET BY MOUTH EVERY DAY 90 tablet 1  . sucralfate (CARAFATE) 1 g tablet TAKE 1 TABLET BY MOUTH 3 TIMES A DAY BETWEEN MEALS 90 tablet 11  . telmisartan-hydrochlorothiazide (MICARDIS HCT) 80-25 MG tablet Take 1 tablet by mouth daily. 90 tablet 1  . valACYclovir (VALTREX) 500 MG tablet TAKE 1 TABLET (500 MG TOTAL) BY MOUTH 2 (TWO) TIMES DAILY. FOR 5 DAYS WITH OUTBREAK 30 tablet 1  . Vitamin D, Cholecalciferol, 1000 units CAPS Take 2,000 Units by mouth.    . carvedilol (COREG) 12.5 MG tablet Take 1 tablet (12.5 mg total) by mouth 2 (two) times daily. 180 tablet 0   No facility-administered medications prior to visit.    ROS: Review of Systems  Constitutional: Negative for activity change, appetite change, chills, fatigue and unexpected weight change.  HENT: Negative for congestion, mouth sores and sinus pressure.   Eyes: Negative for visual disturbance.  Respiratory: Negative for cough and chest tightness.   Gastrointestinal: Negative for abdominal pain and nausea.  Genitourinary: Negative for difficulty urinating, frequency and vaginal pain.  Musculoskeletal: Positive for arthralgias, neck pain and neck stiffness. Negative for back pain  and gait problem.  Skin: Negative for pallor and rash.  Neurological: Negative for dizziness, tremors, weakness, numbness and headaches.  Psychiatric/Behavioral: Negative for confusion and sleep disturbance.    Objective:  BP 132/80   Pulse 87   Temp 98.5 F (36.9 C) (Oral)   Ht 5\' 4"  (1.626 m)   Wt 174 lb (78.9 kg)   SpO2 98%   BMI 29.87 kg/m   BP Readings from Last 3 Encounters:  01/16/20 132/80  01/03/20 124/76  01/02/20 130/81    Wt Readings from Last 3 Encounters:  01/16/20 174 lb (78.9 kg)  01/03/20 176 lb 6.4 oz (80 kg)  01/02/20 177 lb 4 oz (80.4 kg)    Physical Exam Constitutional:       General: She is not in acute distress.    Appearance: She is well-developed.  HENT:     Head: Normocephalic.     Right Ear: External ear normal.     Left Ear: External ear normal.     Nose: Nose normal.  Eyes:     General:        Right eye: No discharge.        Left eye: No discharge.     Conjunctiva/sclera: Conjunctivae normal.     Pupils: Pupils are equal, round, and reactive to light.  Neck:     Thyroid: No thyromegaly.     Vascular: No JVD.     Trachea: No tracheal deviation.  Cardiovascular:     Rate and Rhythm: Normal rate and regular rhythm.     Heart sounds: Normal heart sounds.  Pulmonary:     Effort: No respiratory distress.     Breath sounds: No stridor. No wheezing.  Abdominal:     General: Bowel sounds are normal. There is no distension.     Palpations: Abdomen is soft. There is no mass.     Tenderness: There is no abdominal tenderness. There is no guarding or rebound.  Musculoskeletal:        General: Tenderness present.     Cervical back: Normal range of motion and neck supple.  Lymphadenopathy:     Cervical: No cervical adenopathy.  Skin:    Findings: No erythema or rash.  Neurological:     Mental Status: She is oriented to person, place, and time.     Cranial Nerves: No cranial nerve deficit.     Motor: No abnormal muscle tone.     Coordination: Coordination normal.     Deep Tendon Reflexes: Reflexes normal.  Psychiatric:        Behavior: Behavior normal.        Thought Content: Thought content normal.        Judgment: Judgment normal.     Lab Results  Component Value Date   WBC 6.2 07/26/2019   HGB 13.8 07/26/2019   HCT 40.6 07/26/2019   PLT 398.0 07/26/2019   GLUCOSE 105 (H) 07/26/2019   CHOL 188 07/26/2019   TRIG 120.0 07/26/2019   HDL 40.90 07/26/2019   LDLCALC 123 (H) 07/26/2019   ALT 27 07/26/2019   AST 18 07/26/2019   NA 137 07/26/2019   K 4.0 07/26/2019   CL 98 07/26/2019   CREATININE 0.84 07/26/2019   BUN 14 07/26/2019   CO2  29 07/26/2019   TSH 0.39 07/26/2019    US THYROID  Result Date: 10/31/2019 CLINICAL DATA:  Goiter EXAM: THYROID ULTRASOUND TECHNIQUE: Ultrasound examination of the thyroid gland and adjacent soft tissues was performed. COMPARISON:  None. FINDINGS: Parenchymal Echotexture: Mildly heterogenous Isthmus: 0.5 cm Right lobe: 5.3 x 1.5 x 2.2 cm Left lobe: 5.2 x 1.5 x 2.4 cm _________________________________________________________ Estimated total number of nodules >/= 1 cm: 2 Number of spongiform nodules >/=  2 cm not described below (TR1): 0 Number of mixed cystic and solid nodules >/= 1.5 cm not described below ([place]): 0 _________________________________________________________ Nodule # 1: Location: Right; Inferior Maximum size: 1.1 cm; Other 2 dimensions: 1.1 x 0.7 cm Composition: solid/almost completely solid (2) Echogenicity: isoechoic (1) Shape: not taller-than-wide (0) Margins: ill-defined (0) Echogenic foci: none (0) ACR TI-RADS total points: 3. ACR TI-RADS risk category: TR3 (3 points). ACR TI-RADS recommendations: Given size (<1.4 cm) and appearance, this nodule does NOT meet TI-RADS criteria for biopsy or dedicated follow-up. _________________________________________________________ Nodule # 2: Location: Left; Inferior Maximum size: 1.2 cm; Other 2 dimensions: 0.9 x 0.6 cm Composition: solid/almost completely solid (2) 6 Echogenicity: hypoechoic (2) Shape: not taller-than-wide (0) Margins: ill-defined (0) Echogenic foci: none (0) ACR TI-RADS total points: 4. ACR TI-RADS risk category: TR4 (4-6 points). ACR TI-RADS recommendations: *Given size (>/= 1 - 1.4 cm) and appearance, a follow-up ultrasound in 1 year should be considered based on TI-RADS criteria. _________________________________________________________ There are few prominent but subcentimeter cervical lymph nodes. IMPRESSION: 1. Mildly enlarged, mildly heterogeneous thyroid gland as detailed above. 2. There is a 1.2 cm TR 4 thyroid nodule in the  left inferior thyroid gland. A 1 year follow-up ultrasound is recommended for this thyroid nodule. 3. There is a 1.1 cm TR 3 thyroid nodule in the right inferior thyroid gland. No further follow-up is required for this thyroid nodule. The above is in keeping with the ACR TI-RADS recommendations - J Am Coll Radiol 2017;14:587-595. Electronically Signed   By: Constance Holster M.D.   On: 10/31/2019 19:26    Assessment & Plan:   There are no diagnoses linked to this encounter.   No orders of the defined types were placed in this encounter.    Follow-up: No follow-ups on file.  Walker Kehr, MD

## 2020-01-18 ENCOUNTER — Telehealth: Payer: Self-pay

## 2020-01-18 NOTE — Telephone Encounter (Signed)
New message    Prior authorization for  oxyCODONE-acetaminophen (PERCOCET/ROXICET) 5-325 MG tablet oxyCODONE-acetaminophen (PERCOCET/ROXICET) 5-325 MG tablet   [id number] [place] [place] - [address] AT [place]

## 2020-01-19 NOTE — Telephone Encounter (Signed)
Ok PA Thanks

## 2020-01-22 ENCOUNTER — Encounter: Payer: Self-pay | Admitting: Gastroenterology

## 2020-01-24 NOTE — Telephone Encounter (Signed)
PA submitted via cover-my-meds w/ (Key #: [id number]). Rec'd response saying " Your demographic data has been sent to [facility] successfully. They will respond shortly with your clinical questions and you will be notified by email when available. You can also check for an update later by opening this request from your dashboard.".Marland KitchenJohny Chess

## 2020-01-28 NOTE — Telephone Encounter (Signed)
Check stattus on PA per portal "  This request has received an Unfavorable outcome." Denial information will be faxed to provider..Will forward to MD for FYI...lmb

## 2020-01-29 NOTE — Telephone Encounter (Signed)
Called pt there was no answer LMOM concerning PA denial../lmb

## 2020-01-29 NOTE — Telephone Encounter (Signed)
Noted. Pls Notify Jaicee. Thx

## 2020-02-04 ENCOUNTER — Other Ambulatory Visit: Payer: Self-pay | Admitting: Internal Medicine

## 2020-03-18 ENCOUNTER — Encounter: Payer: Self-pay | Admitting: Internal Medicine

## 2020-03-18 ENCOUNTER — Other Ambulatory Visit: Payer: Self-pay

## 2020-03-18 ENCOUNTER — Ambulatory Visit: Payer: Federal, State, Local not specified - PPO | Admitting: Internal Medicine

## 2020-03-18 ENCOUNTER — Telehealth: Payer: Self-pay

## 2020-03-18 DIAGNOSIS — G8929 Other chronic pain: Secondary | ICD-10-CM

## 2020-03-18 DIAGNOSIS — I10 Essential (primary) hypertension: Secondary | ICD-10-CM

## 2020-03-18 DIAGNOSIS — E538 Deficiency of other specified B group vitamins: Secondary | ICD-10-CM

## 2020-03-18 DIAGNOSIS — E559 Vitamin D deficiency, unspecified: Secondary | ICD-10-CM

## 2020-03-18 DIAGNOSIS — M25511 Pain in right shoulder: Secondary | ICD-10-CM | POA: Diagnosis not present

## 2020-03-18 DIAGNOSIS — M25512 Pain in left shoulder: Secondary | ICD-10-CM

## 2020-03-18 MED ORDER — DICYCLOMINE HCL 10 MG PO CAPS: 10.0000 mg | ORAL_CAPSULE | Freq: Three times a day (TID) | ORAL | 0 refills | Status: DC | PRN

## 2020-03-18 MED ORDER — TELMISARTAN-HCTZ 80-25 MG PO TABS: 1.0000 | ORAL_TABLET | Freq: Every day | ORAL | 1 refills | Status: DC

## 2020-03-18 MED ORDER — OXYCODONE-ACETAMINOPHEN 5-325 MG PO TABS: 1.0000 | ORAL_TABLET | Freq: Two times a day (BID) | ORAL | 0 refills | Status: DC | PRN

## 2020-03-18 MED ORDER — POTASSIUM CHLORIDE ER 10 MEQ PO TBCR: 10.0000 meq | EXTENDED_RELEASE_TABLET | Freq: Every day | ORAL | 3 refills | Status: DC

## 2020-03-18 MED ORDER — TELMISARTAN-HCTZ 80-25 MG PO TABS: 1.0000 | ORAL_TABLET | Freq: Every day | ORAL | 3 refills | Status: DC

## 2020-03-18 MED ORDER — DICYCLOMINE HCL 10 MG PO CAPS: 10.0000 mg | ORAL_CAPSULE | Freq: Three times a day (TID) | ORAL | 3 refills | Status: DC | PRN

## 2020-03-18 MED ORDER — FLUTICASONE PROPIONATE 50 MCG/ACT NA SUSP: 1.0000 | NASAL | 0 refills | Status: DC | PRN

## 2020-03-18 NOTE — Assessment & Plan Note (Signed)
Amlodipine Micardis HCT

## 2020-03-18 NOTE — Telephone Encounter (Signed)
Key: Everlene Balls

## 2020-03-18 NOTE — Assessment & Plan Note (Signed)
Vit D 

## 2020-03-18 NOTE — Assessment & Plan Note (Signed)
Vit B12 

## 2020-03-18 NOTE — Progress Notes (Signed)
Subjective:  Patient ID: Karina Newman, female    DOB: [birth year]  Age: 64 y.o. MRN: [id number]  CC: Follow-up (2 month follow-up. Requesting refills on Micardis, Dicyclomine, Pennsaid, and Oxycodone)   HPI Karina Newman presents for chronic pain - Percocet helps F/u hypokalemia, HTN COVID vaccine adviced  Outpatient Medications Prior to Visit  Medication Sig Dispense Refill  . amLODipine (NORVASC) 5 MG tablet TAKE 1 TABLET (5 MG TOTAL) BY MOUTH DAILY. NEED OFFICE VISIT FOR FUTURE REFILL. 30 tablet 6  . Aspirin-Caffeine (BAYER BACK & BODY PO) Take by mouth as needed.    . Cyanocobalamin (VITAMIN B-12) 500 MCG SUBL 1 sl qd 100 tablet 5  . cyclobenzaprine (FLEXERIL) 5 MG tablet Take 1 tablet (5 mg total) by mouth 3 (three) times daily as needed for muscle spasms. 30 tablet 1  . Diclofenac Sodium (PENNSAID) 2 % SOLN Place 1 application onto the skin 4 (four) times daily as needed (arthritis). 112 g 5  . doxazosin (CARDURA) 4 MG tablet TAKE 1 TABLET BY MOUTH EVERY DAY 90 tablet 2  . DULoxetine (CYMBALTA) 30 MG capsule Take 1 capsule (30 mg total) by mouth daily. 30 capsule 3  . gabapentin (NEURONTIN) 100 MG capsule TAKE 1 CAPSULE BY MOUTH THREE TIMES A DAY AS NEEDED 90 capsule 3  . omeprazole (PRILOSEC) 40 MG capsule Take 1 capsule (40 mg total) by mouth in the morning and at bedtime. 90 capsule 3  . oxyCODONE-acetaminophen (PERCOCET/ROXICET) 5-325 MG tablet Take 1 tablet by mouth 2 (two) times daily as needed for severe pain. 60 tablet 0  . oxyCODONE-acetaminophen (PERCOCET/ROXICET) 5-325 MG tablet Take 1 tablet by mouth 2 (two) times daily as needed for severe pain. 60 tablet 0  . potassium chloride (KLOR-CON) 10 MEQ tablet TAKE 1 TABLET BY MOUTH EVERY DAY 90 tablet 1  . sucralfate (CARAFATE) 1 g tablet TAKE 1 TABLET BY MOUTH 3 TIMES A DAY BETWEEN MEALS 90 tablet 11  . valACYclovir (VALTREX) 500 MG tablet TAKE 1 TABLET (500 MG TOTAL) BY MOUTH 2 (TWO) TIMES DAILY. FOR 5 DAYS WITH OUTBREAK  30 tablet 1  . Vitamin D, Cholecalciferol, 1000 units CAPS Take 2,000 Units by mouth.    . dicyclomine (BENTYL) 10 MG capsule TAKE 1 CAPSULE (10 MG TOTAL) BY MOUTH EVERY 8 (EIGHT) HOURS AS NEEDED FOR SPASMS. 60 capsule 2  . fluticasone (FLONASE) 50 MCG/ACT nasal spray PLACE 1 SPRAY INTO BOTH NOSTRILS AS NEEDED. 16 g 11  . telmisartan-hydrochlorothiazide (MICARDIS HCT) 80-25 MG tablet Take 1 tablet by mouth daily. 90 tablet 1  . benzonatate (TESSALON) 200 MG capsule Take 1 capsule (200 mg total) by mouth 3 (three) times daily as needed for cough. (Patient not taking: Reported on 03/18/2020) 30 capsule 1  . carvedilol (COREG) 12.5 MG tablet Take 1 tablet (12.5 mg total) by mouth 2 (two) times daily. 180 tablet 0  . clindamycin (CLINDAGEL) 1 % gel APPLY TO AFFECTED AREA TWICE A DAY (Patient not taking: Reported on 03/18/2020) 30 g 0   No facility-administered medications prior to visit.    ROS: Review of Systems  Constitutional: Negative for activity change, appetite change, chills, fatigue and unexpected weight change.  HENT: Negative for congestion, mouth sores and sinus pressure.   Eyes: Negative for visual disturbance.  Respiratory: Negative for cough and chest tightness.   Gastrointestinal: Negative for abdominal pain and nausea.  Genitourinary: Negative for difficulty urinating, frequency and vaginal pain.  Musculoskeletal: Positive for arthralgias and back pain. Negative for  gait problem.  Skin: Negative for pallor and rash.  Neurological: Negative for dizziness, tremors, weakness, numbness and headaches.  Psychiatric/Behavioral: Negative for confusion and sleep disturbance.    Objective:  BP 140/84 (BP Location: Left Arm)   Pulse 91   Temp 99 F (37.2 C) (Oral)   Wt 173 lb 1.9 oz (78.5 kg)   LMP  (Approximate)   SpO2 97%   BMI 29.72 kg/m   BP Readings from Last 3 Encounters:  03/18/20 140/84  01/16/20 132/80  01/03/20 124/76    Wt Readings from Last 3 Encounters:    03/18/20 173 lb 1.9 oz (78.5 kg)  01/16/20 174 lb (78.9 kg)  01/03/20 176 lb 6.4 oz (80 kg)    Physical Exam Constitutional:      General: She is not in acute distress.    Appearance: She is well-developed.  HENT:     Head: Normocephalic.     Right Ear: External ear normal.     Left Ear: External ear normal.     Nose: Nose normal.  Eyes:     General:        Right eye: No discharge.        Left eye: No discharge.     Conjunctiva/sclera: Conjunctivae normal.     Pupils: Pupils are equal, round, and reactive to light.  Neck:     Thyroid: No thyromegaly.     Vascular: No JVD.     Trachea: No tracheal deviation.  Cardiovascular:     Rate and Rhythm: Normal rate and regular rhythm.     Heart sounds: Normal heart sounds.  Pulmonary:     Effort: No respiratory distress.     Breath sounds: No stridor. No wheezing.  Abdominal:     General: Bowel sounds are normal. There is no distension.     Palpations: Abdomen is soft. There is no mass.     Tenderness: There is no abdominal tenderness. There is no guarding or rebound.  Musculoskeletal:        General: Tenderness present.     Cervical back: Normal range of motion and neck supple.  Lymphadenopathy:     Cervical: No cervical adenopathy.  Skin:    Findings: No erythema or rash.  Neurological:     Cranial Nerves: No cranial nerve deficit.     Motor: No abnormal muscle tone.     Coordination: Coordination normal.     Deep Tendon Reflexes: Reflexes normal.  Psychiatric:        Behavior: Behavior normal.        Thought Content: Thought content normal.        Judgment: Judgment normal.   shoulders w/pain  Lab Results  Component Value Date   WBC 6.2 07/26/2019   HGB 13.8 07/26/2019   HCT 40.6 07/26/2019   PLT 398.0 07/26/2019   GLUCOSE 105 (H) 07/26/2019   CHOL 188 07/26/2019   TRIG 120.0 07/26/2019   HDL 40.90 07/26/2019   LDLCALC 123 (H) 07/26/2019   ALT 27 07/26/2019   AST 18 07/26/2019   NA 137 07/26/2019   K 4.0  07/26/2019   CL 98 07/26/2019   CREATININE 0.84 07/26/2019   BUN 14 07/26/2019   CO2 29 07/26/2019   TSH 0.39 07/26/2019    US THYROID  Result Date: 10/31/2019 CLINICAL DATA:  Goiter EXAM: THYROID ULTRASOUND TECHNIQUE: Ultrasound examination of the thyroid gland and adjacent soft tissues was performed. COMPARISON:  None. FINDINGS: Parenchymal Echotexture: Mildly heterogenous Isthmus: 0.5 cm  Right lobe: 5.3 x 1.5 x 2.2 cm Left lobe: 5.2 x 1.5 x 2.4 cm _________________________________________________________ Estimated total number of nodules >/= 1 cm: 2 Number of spongiform nodules >/=  2 cm not described below (TR1): 0 Number of mixed cystic and solid nodules >/= 1.5 cm not described below ([place]): 0 _________________________________________________________ Nodule # 1: Location: Right; Inferior Maximum size: 1.1 cm; Other 2 dimensions: 1.1 x 0.7 cm Composition: solid/almost completely solid (2) Echogenicity: isoechoic (1) Shape: not taller-than-wide (0) Margins: ill-defined (0) Echogenic foci: none (0) ACR TI-RADS total points: 3. ACR TI-RADS risk category: TR3 (3 points). ACR TI-RADS recommendations: Given size (<1.4 cm) and appearance, this nodule does NOT meet TI-RADS criteria for biopsy or dedicated follow-up. _________________________________________________________ Nodule # 2: Location: Left; Inferior Maximum size: 1.2 cm; Other 2 dimensions: 0.9 x 0.6 cm Composition: solid/almost completely solid (2) 6 Echogenicity: hypoechoic (2) Shape: not taller-than-wide (0) Margins: ill-defined (0) Echogenic foci: none (0) ACR TI-RADS total points: 4. ACR TI-RADS risk category: TR4 (4-6 points). ACR TI-RADS recommendations: *Given size (>/= 1 - 1.4 cm) and appearance, a follow-up ultrasound in 1 year should be considered based on TI-RADS criteria. _________________________________________________________ There are few prominent but subcentimeter cervical lymph nodes. IMPRESSION: 1. Mildly enlarged, mildly  heterogeneous thyroid gland as detailed above. 2. There is a 1.2 cm TR 4 thyroid nodule in the left inferior thyroid gland. A 1 year follow-up ultrasound is recommended for this thyroid nodule. 3. There is a 1.1 cm TR 3 thyroid nodule in the right inferior thyroid gland. No further follow-up is required for this thyroid nodule. The above is in keeping with the ACR TI-RADS recommendations - J Am Coll Radiol 2017;14:587-595. Electronically Signed   By: Constance Holster M.D.   On: 10/31/2019 19:26    Assessment & Plan:   There are no diagnoses linked to this encounter.   Meds ordered this encounter  Medications  . dicyclomine (BENTYL) 10 MG capsule    Sig: Take 1 capsule (10 mg total) by mouth every 8 (eight) hours as needed for spasms.    Dispense:  90 capsule    Refill:  0  . fluticasone (FLONASE) 50 MCG/ACT nasal spray    Sig: Place 1 spray into both nostrils as needed.    Dispense:  48 g    Refill:  0  . telmisartan-hydrochlorothiazide (MICARDIS HCT) 80-25 MG tablet    Sig: Take 1 tablet by mouth daily.    Dispense:  90 tablet    Refill:  1     Follow-up: No follow-ups on file.  Walker Kehr, MD

## 2020-03-18 NOTE — Assessment & Plan Note (Signed)
Percocet prn  Potential benefits of a long term opioids use as well as potential risks (i.e. addiction risk, apnea etc) and complications (i.e. Somnolence, constipation and others) were explained to the patient and were aknowledged.  

## 2020-03-24 NOTE — Telephone Encounter (Signed)
PA has already been denied.

## 2020-03-25 NOTE — Telephone Encounter (Signed)
New message:   Pt is calling and wants [place] to provide the [facility] to [facility] for this medication. Please advise.

## 2020-03-26 NOTE — Telephone Encounter (Signed)
  Follow up message  Patient requesting call be made to pharmacy. Patient plans to pay out of pocket for medication.

## 2020-03-27 NOTE — Telephone Encounter (Signed)
Called pt back to get clarification. Left vm for pt to call back

## 2020-04-01 NOTE — Telephone Encounter (Signed)
Spoke to pt and informed her I would be contacting her pharmacy to update them on the PA denial

## 2020-05-05 NOTE — Progress Notes (Signed)
Office Visit Note  Patient: Karina Newman             Date of Birth: May 14, 1956           MRN: [id number]             PCP: Cassandria Anger, MD Referring: Cassandria Anger, MD Visit Date: 05/06/2020 Occupation: @GUAROCC @  Subjective:  Other (left knee pain)   History of Present Illness: Karina Newman is a 64 y.o. female with history of osteoarthritis.  She states she continues to have a lot of pain and discomfort in her knee joints.  Both knee joints have been hurting.  Left knee joint is more painful than the right knee joint.  She states she has difficulty coming down stairs.  She feels some locking sensation when she comes on the stairs.  She also has discomfort over the left trochanteric bursa.  She states she is not sleeping much at night due to discomfort.  She continues to have some muscle pain in her trapezius area and her calves.  She has been getting medications from Dr. Alain Marion for pain management.  Activities of Daily Living:  Patient reports morning stiffness for less than 30  minutes.   Patient Reports nocturnal pain.  Difficulty dressing/grooming: Denies Difficulty climbing stairs: Reports Difficulty getting out of chair: Denies Difficulty using hands for taps, buttons, cutlery, and/or writing: Reports  Review of Systems  Constitutional: Negative for fatigue.  HENT: Positive for mouth dryness. Negative for mouth sores and nose dryness.   Eyes: Positive for itching and dryness. Negative for pain.  Respiratory: Negative for shortness of breath and difficulty breathing.   Cardiovascular: Negative for chest pain, palpitations and swelling in legs/feet.  Gastrointestinal: Negative for blood in stool, constipation and diarrhea.  Endocrine: Negative for increased urination.  Genitourinary: Negative for difficulty urinating.  Musculoskeletal: Positive for arthralgias, joint pain, myalgias, morning stiffness and myalgias. Negative for joint swelling and muscle  tenderness.  Skin: Negative for color change, rash and redness.  Allergic/Immunologic: Negative for susceptible to infections.  Neurological: Positive for numbness, headaches and memory loss. Negative for dizziness and weakness.  Hematological: Positive for bruising/bleeding tendency.  Psychiatric/Behavioral: Negative for confusion.    PMFS History:  Patient Active Problem List   Diagnosis Date Noted  . Dysphagia 01/02/2020  . Globus sensation 01/02/2020  . Thoracic back pain 12/19/2019  . Sore throat 10/24/2019  . Goiter 10/24/2019  . Cough 10/24/2019  . Neoplasm of uncertain behavior of skin 09/27/2019  . Pain of right breast 06/03/2019  . Fatty liver 01/23/2019  . Hand pain 09/28/2018  . Knee pain 09/28/2018  . Inappropriate sinus tachycardia 05/17/2018  . Apnea 05/17/2018  . Upper respiratory infection 09/23/2017  . Fatigue 09/12/2017  . Well adult exam 01/19/2017  . Abdominal pain 01/19/2017  . Headache 10/27/2016  . Pain in right shoulder 09/16/2016  . Left hip pain 09/16/2016  . Low vitamin B12 level 03/16/2016  . Piriformis syndrome of right side 02/04/2016  . IT band syndrome 02/04/2016  . RUQ abdominal pain 02/04/2016  . Low back pain radiating to right lower extremity 09/19/2015  . Allergic rhinitis 09/19/2015  . Atypical chest pain 04/07/2015  . GERD (gastroesophageal reflux disease) 04/07/2015  . Axillary adenitis 03/23/2015  . Migraine headache 03/23/2015  . Hives 01/29/2015  . Edema 01/29/2015  . Cervical disc disorder with radiculopathy of cervical region 12/11/2014  . Insomnia 07/31/2014  . Degenerative cervical disc 05/08/2014  . Pain in joint,  shoulder region 03/05/2014  . Patellofemoral arthralgia of both knees 03/01/2014  . Bilateral shoulder pain 10/18/2013  . Sinusitis, bacterial 08/20/2013  . URI, acute 08/14/2013  . Hemoptysis 08/14/2013  . Otitis media of left ear 08/14/2013  . Labral tear of shoulder 07/03/2013  . Hyperthyroidism  07/03/2013  . HTN (hypertension), benign 07/03/2013  . LVH (left ventricular hypertrophy) due to hypertensive disease 07/03/2013  . Vitamin D deficiency 07/03/2013    Past Medical History:  Diagnosis Date  . Abnormal cervical Pap smear with positive HPV DNA test 01/2014,01/2017   2015 Normal cytology with positive high-risk HPV 18/45. Colposcopy negative with negative ECC.  2018 normal cytology with positive high-risk HPV 18/45  . Anemia   . Apnea 05/17/2018  . Autoimmune gastritis   . Fatty liver   . Gastritis   . Heart murmur   . Hypertension   . Hyperthyroidism   . Inappropriate sinus tachycardia 05/17/2018  . Internal hemorrhoids   . Osteoarthritis   . Right shoulder pain 2001   chronic pain  . Thyroid disease    Hyperthyroid. Was on PTU (Dr Hampton Abbot in [place]) - stopped in 2013, stable  . Tubular adenoma of colon     Family History  Problem Relation Age of Onset  . Kidney disease Mother        ESRD  . Hypertension Mother   . Cancer Father        lung ca  . Hypertension Sister   . Kidney disease Sister   . Diabetes Brother   . Hypertension Brother   . Diabetes Paternal Grandmother   . Hypertension Sister   . Hypertension Brother   . Sleep apnea Son   . Hypertension Son   . Colon cancer Neg Hx   . Stomach cancer Neg Hx   . Esophageal cancer Neg Hx   . Rectal cancer Neg Hx   . Pancreatic cancer Neg Hx    Past Surgical History:  Procedure Laterality Date  . BREAST CYST ASPIRATION Left 2012  . CESAREAN SECTION     x 4   . HAND SURGERY Left   . SHOULDER SURGERY Right 2002  . SHOULDER SURGERY Left 2014  . TUBAL LIGATION     Social History   Social History Narrative  . Not on file   Immunization History  Administered Date(s) Administered  . Zoster Recombinat (Shingrix) 03/25/2017, 06/27/2017     Objective: Vital Signs: BP 129/87 (BP Location: Left Arm, Patient Position: Sitting, Cuff Size: Normal)   Pulse 76   Resp 14   Ht 5' 4.5" (1.638 m)   Wt 175 lb 9.6 oz  (79.7 kg)   BMI 29.68 kg/m    Physical Exam Vitals and nursing note reviewed.  Constitutional:      Appearance: She is well-developed.  HENT:     Head: Normocephalic and atraumatic.  Eyes:     Conjunctiva/sclera: Conjunctivae normal.  Cardiovascular:     Rate and Rhythm: Normal rate and regular rhythm.     Heart sounds: Normal heart sounds.  Pulmonary:     Effort: Pulmonary effort is normal.     Breath sounds: Normal breath sounds.  Abdominal:     General: Bowel sounds are normal.     Palpations: Abdomen is soft.  Musculoskeletal:     Cervical back: Normal range of motion.  Lymphadenopathy:     Cervical: No cervical adenopathy.  Skin:    General: Skin is warm and dry.     Capillary  Refill: Capillary refill takes less than 2 seconds.  Neurological:     Mental Status: She is alert and oriented to person, place, and time.  Psychiatric:        Behavior: Behavior normal.      Musculoskeletal Exam: She has good range of motion of cervical spine, shoulders, elbows, wrist joints, MCPs PIPs and DIPs without any synovitis.  She had good range of motion of bilateral hip joints with some tenderness on palpation over left trochanteric bursa.  She had good range of motion of bilateral knee joints without any warmth swelling or effusion.  There was no tenderness over ankle joints or MTPs.  CDAI Exam: CDAI Score: -- Patient Global: --; Provider Global: -- Swollen: --; Tender: -- Joint Exam 05/06/2020   No joint exam has been documented for this visit   There is currently no information documented on the homunculus. Go to the Rheumatology activity and complete the homunculus joint exam.  Investigation: No additional findings.  Imaging: No results found.  Recent Labs: Lab Results  Component Value Date   WBC 6.2 07/26/2019   HGB 13.8 07/26/2019   PLT 398.0 07/26/2019   NA 137 07/26/2019   K 4.0 07/26/2019   CL 98 07/26/2019   CO2 29 07/26/2019   GLUCOSE 105 (H) 07/26/2019    BUN 14 07/26/2019   CREATININE 0.84 07/26/2019   BILITOT 0.5 07/26/2019   ALKPHOS 77 07/26/2019   AST 18 07/26/2019   ALT 27 07/26/2019   PROT 8.4 (H) 07/26/2019   ALBUMIN 4.5 07/26/2019   CALCIUM 9.7 07/26/2019   GFRAA 78 06/20/2018    Speciality Comments: No specialty comments available.  Procedures:  Large Joint Inj: L knee on 05/06/2020 3:11 PM Indications: pain Details: 27 G 1.5 in needle, medial approach  Arthrogram: No  Medications: 40 mg triamcinolone acetonide 40 MG/ML; 1.5 mL lidocaine 1 % Aspirate: 0 mL Outcome: tolerated well, no immediate complications Procedure, treatment alternatives, risks and benefits explained, specific risks discussed. Consent was given by the patient. Immediately prior to procedure a time out was called to verify the correct patient, procedure, equipment, support staff and site/side marked as required. Patient was prepped and draped in the usual sterile fashion.     Allergies: Penicillins, Tramadol, Gabapentin, Iodine, Lyrica [pregabalin], Nsaids, and Voltaren [diclofenac]   Assessment / Plan:     Visit Diagnoses: Primary osteoarthritis of both knees -she has been experiencing increased pain and discomfort in her knee joints.  She requests cortisone injection to her left knee joint.  She is having difficulty coming down the stairs.  We will obtain x-rays today.  Plan: XR KNEE 3 VIEW RIGHT, XR KNEE 3 VIEW LEFT.  X-ray showed bilateral moderate osteoarthritis and moderate chondromalacia patella.  No radiographic progression was noted when compared to the x-rays of 2020.  After informed consent was obtained left knee joint was prepped and injected with cortisone as described above.  She tolerated the procedure well.  Knee joint exercises were given.  If she has persistent symptoms we may consider MRI.  We also discussed Visco supplement injections.  She may benefit from Visco supplement injections as the cortisone injections do not last very long.  A  handout on knee joint exercises was given.  She has done exercises in the past.  This patient is diagnosed with osteoarthritis of the knee(s).    Radiographs show evidence of joint space narrowing, osteophytes, subchondral sclerosis and/or subchondral cysts.  This patient has knee pain which interferes  with functional and activities of daily living.    This patient has experienced inadequate response, adverse effects and/or intolerance with conservative treatments such as acetaminophen, NSAIDS, topical creams, physical therapy or regular exercise, knee bracing and/or weight loss.   This patient has experienced inadequate response or has a contraindication to intra articular steroid injections for at least 3 months.   This patient is not scheduled to have a total knee replacement within 6 months of starting treatment with viscosupplementation.  Trochanteric bursitis of left hip-I have given handout on IT band exercises.  Cervical disc disorder with radiculopathy of cervical region-she continues to have neck pain and stiffness.  Other insomnia-she is having insomnia due to nocturnal pain.  She has been taking pain medications.  Vitamin D deficiency  Hypertensive left ventricular hypertrophy, without heart failure  HTN (hypertension), benign-blood pressure is well controlled.  Diastolic was mildly elevated.  Fatty liver  History of gastroesophageal reflux (GERD)  Hyperthyroidism  Tubular adenoma of colon   Educated about COVID-19 virus infection-she has not received COVID-19 vaccines.  Risk of not getting vaccination was discussed at length.  She was encouraged to get her vaccination.  Use of mask, social distancing and hand hygiene was emphasized. Orders: Orders Placed This Encounter  Procedures  . Large Joint Inj  . XR KNEE 3 VIEW RIGHT  . XR KNEE 3 VIEW LEFT   No orders of the defined types were placed in this encounter.     Follow-Up Instructions: Return in about 6  months (around 11/03/2020) for Osteoarthritis.   Bo Merino, MD  Note - This record has been created using Editor, commissioning.  Chart creation errors have been sought, but may not always  have been located. Such creation errors do not reflect on  the standard of medical care.

## 2020-05-06 ENCOUNTER — Ambulatory Visit: Payer: Medicare Other | Admitting: Rheumatology

## 2020-05-06 ENCOUNTER — Encounter: Payer: Self-pay | Admitting: Rheumatology

## 2020-05-06 ENCOUNTER — Ambulatory Visit: Payer: Self-pay

## 2020-05-06 ENCOUNTER — Telehealth: Payer: Self-pay

## 2020-05-06 ENCOUNTER — Other Ambulatory Visit: Payer: Self-pay

## 2020-05-06 VITALS — BP 129/87 | HR 76 | Resp 14 | Ht 64.5 in | Wt 175.6 lb

## 2020-05-06 DIAGNOSIS — E0590 Thyrotoxicosis, unspecified without thyrotoxic crisis or storm: Secondary | ICD-10-CM

## 2020-05-06 DIAGNOSIS — M1711 Unilateral primary osteoarthritis, right knee: Secondary | ICD-10-CM

## 2020-05-06 DIAGNOSIS — M7062 Trochanteric bursitis, left hip: Secondary | ICD-10-CM | POA: Diagnosis not present

## 2020-05-06 DIAGNOSIS — G4709 Other insomnia: Secondary | ICD-10-CM

## 2020-05-06 DIAGNOSIS — M1712 Unilateral primary osteoarthritis, left knee: Secondary | ICD-10-CM

## 2020-05-06 DIAGNOSIS — I10 Essential (primary) hypertension: Secondary | ICD-10-CM

## 2020-05-06 DIAGNOSIS — I119 Hypertensive heart disease without heart failure: Secondary | ICD-10-CM

## 2020-05-06 DIAGNOSIS — M5010 Cervical disc disorder with radiculopathy, unspecified cervical region: Secondary | ICD-10-CM

## 2020-05-06 DIAGNOSIS — M170 Bilateral primary osteoarthritis of knee: Secondary | ICD-10-CM | POA: Diagnosis not present

## 2020-05-06 DIAGNOSIS — K760 Fatty (change of) liver, not elsewhere classified: Secondary | ICD-10-CM

## 2020-05-06 DIAGNOSIS — Z8719 Personal history of other diseases of the digestive system: Secondary | ICD-10-CM

## 2020-05-06 DIAGNOSIS — E559 Vitamin D deficiency, unspecified: Secondary | ICD-10-CM

## 2020-05-06 DIAGNOSIS — D126 Benign neoplasm of colon, unspecified: Secondary | ICD-10-CM

## 2020-05-06 MED ORDER — LIDOCAINE HCL 1 % IJ SOLN
1.5000 mL | INTRAMUSCULAR | Status: AC | PRN
  Administered 2020-05-06: 1.5 mL

## 2020-05-06 MED ORDER — TRIAMCINOLONE ACETONIDE 40 MG/ML IJ SUSP
40.0000 mg | INTRAMUSCULAR | Status: AC | PRN
  Administered 2020-05-06: 40 mg via INTRA_ARTICULAR

## 2020-05-06 NOTE — Telephone Encounter (Signed)
Please apply for bilateral knee visco, per Dr. Deveshwar. Thanks!  

## 2020-05-06 NOTE — Patient Instructions (Addendum)
Iliotibial Band Syndrome Rehab Ask your health care provider which exercises are safe for you. Do exercises exactly as told by your health care provider and adjust them as directed. It is normal to feel mild stretching, pulling, tightness, or discomfort as you do these exercises. Stop right away if you feel sudden pain or your pain gets significantly worse. Do not begin these exercises until told by your health care provider. Stretching and range-of-motion exercises These exercises warm up your muscles and joints and improve the movement and flexibility of your hip and pelvis. Quadriceps stretch, prone  1. Lie on your abdomen on a firm surface, such as a bed or padded floor (prone position). 2. Bend your left / right knee and reach back to hold your ankle or pant leg. If you cannot reach your ankle or pant leg, loop a belt around your foot and grab the belt instead. 3. Gently pull your heel toward your buttocks. Your knee should not slide out to the side. You should feel a stretch in the front of your thigh and knee (quadriceps). 4. Hold this position for __________ seconds. Repeat __________ times. Complete this exercise __________ times a day. Iliotibial band stretch An iliotibial band is a strong band of muscle tissue that runs from the outer side of your hip to the outer side of your thigh and knee. 1. Lie on your side with your left / right leg in the top position. 2. Bend both of your knees and grab your left / right ankle. Stretch out your bottom arm to help you balance. 3. Slowly bring your top knee back so your thigh goes behind your trunk. 4. Slowly lower your top leg toward the floor until you feel a gentle stretch on the outside of your left / right hip and thigh. If you do not feel a stretch and your knee will not fall farther, place the heel of your other foot on top of your knee and pull your knee down toward the floor with your foot. 5. Hold this position for __________  seconds. Repeat __________ times. Complete this exercise __________ times a day. Strengthening exercises These exercises build strength and endurance in your hip and pelvis. Endurance is the ability to use your muscles for a long time, even after they get tired. Straight leg raises, side-lying This exercise strengthens the muscles that rotate the leg at the hip and move it away from your body (hip abductors). 1. Lie on your side with your left / right leg in the top position. Lie so your head, shoulder, hip, and knee line up. You may bend your bottom knee to help you balance. 2. Roll your hips slightly forward so your hips are stacked directly over each other and your left / right knee is facing forward. 3. Tense the muscles in your outer thigh and lift your top leg 4-6 inches (10-15 cm). 4. Hold this position for __________ seconds. 5. Slowly return to the starting position. Let your muscles relax completely before doing another repetition. Repeat __________ times. Complete this exercise __________ times a day. Leg raises, prone This exercise strengthens the muscles that move the hips (hip extensors). 1. Lie on your abdomen on your bed or a firm surface. You can put a pillow under your hips if that is more comfortable for your lower back. 2. Bend your left / right knee so your foot is straight up in the air. 3. Squeeze your buttocks muscles and lift your left / right thigh   off the bed. Do not let your back arch. 4. Tense your thigh muscle as hard as you can without increasing any knee pain. 5. Hold this position for __________ seconds. 6. Slowly lower your leg to the starting position and allow it to relax completely. Repeat __________ times. Complete this exercise __________ times a day. Hip hike 1. Stand sideways on a bottom step. Stand on your left / right leg with your other foot unsupported next to the step. You can hold on to the railing or wall for balance if needed. 2. Keep your knees  straight and your torso square. Then lift your left / right hip up toward the ceiling. 3. Slowly let your left / right hip lower toward the floor, past the starting position. Your foot should get closer to the floor. Do not lean or bend your knees. Repeat __________ times. Complete this exercise __________ times a day. This information is not intended to replace advice given to you by your health care provider. Make sure you discuss any questions you have with your health care provider. Document Revised: 11/23/2018 Document Reviewed: 05/24/2018 Elsevier Patient Education  2020 [employer], 15(4), [phone]. Retrieved May 22, 2018 from [email or web address]">  Knee Exercises Ask your health care provider which exercises are safe for you. Do exercises exactly as told by your health care provider and adjust them as directed. It is normal to feel mild stretching, pulling, tightness, or discomfort as you do these exercises. Stop right away if you feel sudden pain or your pain gets worse. Do not begin these exercises until told by your health care provider. Stretching and range-of-motion exercises These exercises warm up your muscles and joints and improve the movement and flexibility of your knee. These exercises also help to relieve pain and swelling. Knee extension, prone 5. Lie on your abdomen (prone position) on a bed. 6. Place your left / right knee just beyond the edge of the surface so your knee is not on the bed. You can put a towel under your left / right thigh just above your kneecap for comfort. 7. Relax your leg muscles and allow gravity to straighten your knee (extension). You should feel a stretch behind your left / right knee. 8. Hold this position for __________ seconds. 9. Scoot up so your knee is supported between repetitions. Repeat __________ times. Complete this exercise __________ times a day. Knee flexion, active  6. Lie on your back  with both legs straight. If this causes back discomfort, bend your left / right knee so your foot is flat on the floor. 7. Slowly slide your left / right heel back toward your buttocks. Stop when you feel a gentle stretch in the front of your knee or thigh (flexion). 8. Hold this position for __________ seconds. 9. Slowly slide your left / right heel back to the starting position. Repeat __________ times. Complete this exercise __________ times a day. Quadriceps stretch, prone  6. Lie on your abdomen on a firm surface, such as a bed or padded floor. 7. Bend your left / right knee and hold your ankle. If you cannot reach your ankle or pant leg, loop a belt around your foot and grab the belt instead. 8. Gently pull your heel toward your buttocks. Your knee should not slide out to the side. You should feel a stretch in the front of your thigh and knee (quadriceps). 9. Hold this position for __________ seconds. Repeat __________ times. Complete this exercise __________  times a day. Hamstring, supine 7. Lie on your back (supine position). 8. Loop a belt or towel over the ball of your left / right foot. The ball of your foot is on the walking surface, right under your toes. 9. Straighten your left / right knee and slowly pull on the belt to raise your leg until you feel a gentle stretch behind your knee (hamstring). ? Do not let your knee bend while you do this. ? Keep your other leg flat on the floor. 10. Hold this position for __________ seconds. Repeat __________ times. Complete this exercise __________ times a day. Strengthening exercises These exercises build strength and endurance in your knee. Endurance is the ability to use your muscles for a long time, even after they get tired. Quadriceps, isometric This exercise stretches the muscles in front of your thigh (quadriceps) without moving your knee joint (isometric). 4. Lie on your back with your left / right leg extended and your other knee  bent. Put a rolled towel or small pillow under your knee if told by your health care provider. 5. Slowly tense the muscles in the front of your left / right thigh. You should see your kneecap slide up toward your hip or see increased dimpling just above the knee. This motion will push the back of the knee toward the floor. 6. For __________ seconds, hold the muscle as tight as you can without increasing your pain. 7. Relax the muscles slowly and completely. Repeat __________ times. Complete this exercise __________ times a day. Straight leg raises This exercise stretches the muscles in front of your thigh (quadriceps) and the muscles that move your hips (hip flexors). 1. Lie on your back with your left / right leg extended and your other knee bent. 2. Tense the muscles in the front of your left / right thigh. You should see your kneecap slide up or see increased dimpling just above the knee. Your thigh may even shake a bit. 3. Keep these muscles tight as you raise your leg 4-6 inches (10-15 cm) off the floor. Do not let your knee bend. 4. Hold this position for __________ seconds. 5. Keep these muscles tense as you lower your leg. 6. Relax your muscles slowly and completely after each repetition. Repeat __________ times. Complete this exercise __________ times a day. Hamstring, isometric 1. Lie on your back on a firm surface. 2. Bend your left / right knee about __________ degrees. 3. Dig your left / right heel into the surface as if you are trying to pull it toward your buttocks. Tighten the muscles in the back of your thighs (hamstring) to "dig" as hard as you can without increasing any pain. 4. Hold this position for __________ seconds. 5. Release the tension gradually and allow your muscles to relax completely for __________ seconds after each repetition. Repeat __________ times. Complete this exercise __________ times a day. Hamstring curls If told by your health care provider, do this  exercise while wearing ankle weights. Begin with __________ lb weights. Then increase the weight by 1 lb (0.5 kg) increments. Do not wear ankle weights that are more than __________ lb. 1. Lie on your abdomen with your legs straight. 2. Bend your left / right knee as far as you can without feeling pain. Keep your hips flat against the floor. 3. Hold this position for __________ seconds. 4. Slowly lower your leg to the starting position. Repeat __________ times. Complete this exercise __________ times a day. Squats This exercise  strengthens the muscles in front of your thigh and knee (quadriceps). 1. Stand in front of a table, with your feet and knees pointing straight ahead. You may rest your hands on the table for balance but not for support. 2. Slowly bend your knees and lower your hips like you are going to sit in a chair. ? Keep your weight over your heels, not over your toes. ? Keep your lower legs upright so they are parallel with the table legs. ? Do not let your hips go lower than your knees. ? Do not bend lower than told by your health care provider. ? If your knee pain increases, do not bend as low. 3. Hold the squat position for __________ seconds. 4. Slowly push with your legs to return to standing. Do not use your hands to pull yourself to standing. Repeat __________ times. Complete this exercise __________ times a day. Wall slides This exercise strengthens the muscles in front of your thigh and knee (quadriceps). 1. Lean your back against a smooth wall or door, and walk your feet out 18-24 inches (46-61 cm) from it. 2. Place your feet hip-width apart. 3. Slowly slide down the wall or door until your knees bend __________ degrees. Keep your knees over your heels, not over your toes. Keep your knees in line with your hips. 4. Hold this position for __________ seconds. Repeat __________ times. Complete this exercise __________ times a day. Straight leg raises This exercise  strengthens the muscles that rotate the leg at the hip and move it away from your body (hip abductors). 1. Lie on your side with your left / right leg in the top position. Lie so your head, shoulder, knee, and hip line up. You may bend your bottom knee to help you keep your balance. 2. Roll your hips slightly forward so your hips are stacked directly over each other and your left / right knee is facing forward. 3. Leading with your heel, lift your top leg 4-6 inches (10-15 cm). You should feel the muscles in your outer hip lifting. ? Do not let your foot drift forward. ? Do not let your knee roll toward the ceiling. 4. Hold this position for __________ seconds. 5. Slowly return your leg to the starting position. 6. Let your muscles relax completely after each repetition. Repeat __________ times. Complete this exercise __________ times a day. Straight leg raises This exercise stretches the muscles that move your hips away from the front of the pelvis (hip extensors). 1. Lie on your abdomen on a firm surface. You can put a pillow under your hips if that is more comfortable. 2. Tense the muscles in your buttocks and lift your left / right leg about 4-6 inches (10-15 cm). Keep your knee straight as you lift your leg. 3. Hold this position for __________ seconds. 4. Slowly lower your leg to the starting position. 5. Let your leg relax completely after each repetition. Repeat __________ times. Complete this exercise __________ times a day. This information is not intended to replace advice given to you by your health care provider. Make sure you discuss any questions you have with your health care provider. Document Revised: 05/23/2018 Document Reviewed: 05/23/2018 Else[employer] Patient Education  [place] Band Syndrome Rehab Ask your health care provider which exercises are safe for you. Do exercises exactly as told by your health care provider and adjust them as directed. It is  normal to feel mild stretching, pulling, tightness, or discomfort as  you do these exercises. Stop right away if you feel sudden pain or your pain gets significantly worse. Do not begin these exercises until told by your health care provider. Stretching and range-of-motion exercises These exercises warm up your muscles and joints and improve the movement and flexibility of your hip and pelvis. Quadriceps stretch, prone  10. Lie on your abdomen on a firm surface, such as a bed or padded floor (prone position). [place] your left / right knee and reach back to hold your ankle or pant leg. If you cannot reach your ankle or pant leg, loop a belt around your foot and grab the belt instead. 12. Gently pull your heel toward your buttocks. Your knee should not slide out to the side. You should feel a stretch in the front of your thigh and knee (quadriceps). 13. Hold this position for __________ seconds. Repeat __________ times. Complete this exercise __________ times a day. Iliotibial band stretch An iliotibial band is a strong band of muscle tissue that runs from the outer side of your hip to the outer side of your thigh and knee. 10. Lie on your side with your left / right leg in the top position. [place] both of your knees and grab your left / right ankle. Stretch out your bottom arm to help you balance. 12. Slowly bring your top knee back so your thigh goes behind your trunk. 13. Slowly lower your top leg toward the floor until you feel a gentle stretch on the outside of your left / right hip and thigh. If you do not feel a stretch and your knee will not fall farther, place the heel of your other foot on top of your knee and pull your knee down toward the floor with your foot. 14. Hold this position for __________ seconds. Repeat __________ times. Complete this exercise __________ times a day. Strengthening exercises These exercises build strength and endurance in your hip and pelvis. Endurance is the  ability to use your muscles for a long time, even after they get tired. Straight leg raises, side-lying This exercise strengthens the muscles that rotate the leg at the hip and move it away from your body (hip abductors). 10. Lie on your side with your left / right leg in the top position. Lie so your head, shoulder, hip, and knee line up. You may bend your bottom knee to help you balance. 11. Roll your hips slightly forward so your hips are stacked directly over each other and your left / right knee is facing forward. 12. Tense the muscles in your outer thigh and lift your top leg 4-6 inches (10-15 cm). 13. Hold this position for __________ seconds. 14. Slowly return to the starting position. Let your muscles relax completely before doing another repetition. Repeat __________ times. Complete this exercise __________ times a day. Leg raises, prone This exercise strengthens the muscles that move the hips (hip extensors). 41. Lie on your abdomen on your bed or a firm surface. You can put a pillow under your hips if that is more comfortable for your lower back. 61. Bend your left / right knee so your foot is straight up in the air. 13. Squeeze your buttocks muscles and lift your left / right thigh off the bed. Do not let your back arch. 14. Tense your thigh muscle as hard as you can without increasing any knee pain. 15. Hold this position for __________ seconds. 16. Slowly lower your leg to the starting position and allow  it to relax completely. Repeat __________ times. Complete this exercise __________ times a day. Hip hike 8. Stand sideways on a bottom step. Stand on your left / right leg with your other foot unsupported next to the step. You can hold on to the railing or wall for balance if needed. 9. Keep your knees straight and your torso square. Then lift your left / right hip up toward the ceiling. 10. Slowly let your left / right hip lower toward the floor, past the starting position. Your  foot should get closer to the floor. Do not lean or bend your knees. Repeat __________ times. Complete this exercise __________ times a day. This information is not intended to replace advice given to you by your health care provider. Make sure you discuss any questions you have with your health care provider. Document Revised: 11/23/2018 Document Reviewed: 05/24/2018 Elsevier Patient Education  2020 [employer], 15(4), [id number]. Retrieved May 22, 2018 from [email or web address]">  Knee Exercises Ask your health care provider which exercises are safe for you. Do exercises exactly as told by your health care provider and adjust them as directed. It is normal to feel mild stretching, pulling, tightness, or discomfort as you do these exercises. Stop right away if you feel sudden pain or your pain gets worse. Do not begin these exercises until told by your health care provider. Stretching and range-of-motion exercises These exercises warm up your muscles and joints and improve the movement and flexibility of your knee. These exercises also help to relieve pain and swelling. Knee extension, prone 14. Lie on your abdomen (prone position) on a bed. 15. Place your left / right knee just beyond the edge of the surface so your knee is not on the bed. You can put a towel under your left / right thigh just above your kneecap for comfort. 16. Relax your leg muscles and allow gravity to straighten your knee (extension). You should feel a stretch behind your left / right knee. 17. Hold this position for __________ seconds. 18. Scoot up so your knee is supported between repetitions. Repeat __________ times. Complete this exercise __________ times a day. Knee flexion, active  15. Lie on your back with both legs straight. If this causes back discomfort, bend your left / right knee so your foot is flat on the floor. 16. Slowly slide your left / right heel back toward  your buttocks. Stop when you feel a gentle stretch in the front of your knee or thigh (flexion). 17. Hold this position for __________ seconds. 18. Slowly slide your left / right heel back to the starting position. Repeat __________ times. Complete this exercise __________ times a day. Quadriceps stretch, prone  15. Lie on your abdomen on a firm surface, such as a bed or padded floor. 75. Bend your left / right knee and hold your ankle. If you cannot reach your ankle or pant leg, loop a belt around your foot and grab the belt instead. 17. Gently pull your heel toward your buttocks. Your knee should not slide out to the side. You should feel a stretch in the front of your thigh and knee (quadriceps). 18. Hold this position for __________ seconds. Repeat __________ times. Complete this exercise __________ times a day. Hamstring, supine 17. Lie on your back (supine position). 18. Loop a belt or towel over the ball of your left / right foot. The ball of your foot is on the walking surface, right under your toes.  19. Straighten your left / right knee and slowly pull on the belt to raise your leg until you feel a gentle stretch behind your knee (hamstring). ? Do not let your knee bend while you do this. ? Keep your other leg flat on the floor. 20. Hold this position for __________ seconds. Repeat __________ times. Complete this exercise __________ times a day. Strengthening exercises These exercises build strength and endurance in your knee. Endurance is the ability to use your muscles for a long time, even after they get tired. Quadriceps, isometric This exercise stretches the muscles in front of your thigh (quadriceps) without moving your knee joint (isometric). 30. Lie on your back with your left / right leg extended and your other knee bent. Put a rolled towel or small pillow under your knee if told by your health care provider. 12. Slowly tense the muscles in the front of your left / right  thigh. You should see your kneecap slide up toward your hip or see increased dimpling just above the knee. This motion will push the back of the knee toward the floor. 13. For __________ seconds, hold the muscle as tight as you can without increasing your pain. 14. Relax the muscles slowly and completely. Repeat __________ times. Complete this exercise __________ times a day. Straight leg raises This exercise stretches the muscles in front of your thigh (quadriceps) and the muscles that move your hips (hip flexors). 7. Lie on your back with your left / right leg extended and your other knee bent. 8. Tense the muscles in the front of your left / right thigh. You should see your kneecap slide up or see increased dimpling just above the knee. Your thigh may even shake a bit. 9. Keep these muscles tight as you raise your leg 4-6 inches (10-15 cm) off the floor. Do not let your knee bend. 10. Hold this position for __________ seconds. 11. Keep these muscles tense as you lower your leg. 12. Relax your muscles slowly and completely after each repetition. Repeat __________ times. Complete this exercise __________ times a day. Hamstring, isometric 6. Lie on your back on a firm surface. 7. Bend your left / right knee about __________ degrees. 8. Dig your left / right heel into the surface as if you are trying to pull it toward your buttocks. Tighten the muscles in the back of your thighs (hamstring) to "dig" as hard as you can without increasing any pain. 9. Hold this position for __________ seconds. 10. Release the tension gradually and allow your muscles to relax completely for __________ seconds after each repetition. Repeat __________ times. Complete this exercise __________ times a day. Hamstring curls If told by your health care provider, do this exercise while wearing ankle weights. Begin with __________ lb weights. Then increase the weight by 1 lb (0.5 kg) increments. Do not wear ankle weights  that are more than __________ lb. 5. Lie on your abdomen with your legs straight. 6. Bend your left / right knee as far as you can without feeling pain. Keep your hips flat against the floor. 7. Hold this position for __________ seconds. 8. Slowly lower your leg to the starting position. Repeat __________ times. Complete this exercise __________ times a day. Squats This exercise strengthens the muscles in front of your thigh and knee (quadriceps). 5. Stand in front of a table, with your feet and knees pointing straight ahead. You may rest your hands on the table for balance but not for support. 6.  Slowly bend your knees and lower your hips like you are going to sit in a chair. ? Keep your weight over your heels, not over your toes. ? Keep your lower legs upright so they are parallel with the table legs. ? Do not let your hips go lower than your knees. ? Do not bend lower than told by your health care provider. ? If your knee pain increases, do not bend as low. 7. Hold the squat position for __________ seconds. 8. Slowly push with your legs to return to standing. Do not use your hands to pull yourself to standing. Repeat __________ times. Complete this exercise __________ times a day. Wall slides This exercise strengthens the muscles in front of your thigh and knee (quadriceps). 5. Lean your back against a smooth wall or door, and walk your feet out 18-24 inches (46-61 cm) from it. 6. Place your feet hip-width apart. 7. Slowly slide down the wall or door until your knees bend __________ degrees. Keep your knees over your heels, not over your toes. Keep your knees in line with your hips. 8. Hold this position for __________ seconds. Repeat __________ times. Complete this exercise __________ times a day. Straight leg raises This exercise strengthens the muscles that rotate the leg at the hip and move it away from your body (hip abductors). 7. Lie on your side with your left / right leg in the  top position. Lie so your head, shoulder, knee, and hip line up. You may bend your bottom knee to help you keep your balance. 8. Roll your hips slightly forward so your hips are stacked directly over each other and your left / right knee is facing forward. 9. Leading with your heel, lift your top leg 4-6 inches (10-15 cm). You should feel the muscles in your outer hip lifting. ? Do not let your foot drift forward. ? Do not let your knee roll toward the ceiling. 10. Hold this position for __________ seconds. 11. Slowly return your leg to the starting position. 12. Let your muscles relax completely after each repetition. Repeat __________ times. Complete this exercise __________ times a day. Straight leg raises This exercise stretches the muscles that move your hips away from the front of the pelvis (hip extensors). 6. Lie on your abdomen on a firm surface. You can put a pillow under your hips if that is more comfortable. 7. Tense the muscles in your buttocks and lift your left / right leg about 4-6 inches (10-15 cm). Keep your knee straight as you lift your leg. 8. Hold this position for __________ seconds. 9. Slowly lower your leg to the starting position. 10. Let your leg relax completely after each repetition. Repeat __________ times. Complete this exercise __________ times a day. This information is not intended to replace advice given to you by your health care provider. Make sure you discuss any questions you have with your health care provider. Document Revised: 05/23/2018 Document Reviewed: 05/23/2018 Elsevier Patient Education  2020 [employer].

## 2020-05-09 NOTE — Telephone Encounter (Signed)
Please call to schedule [place]co knee injections.  Authorized for Synvisc series Bilateral knees. Buy and [employer] Deductible does not apply. PA not required. Insurance to cover 70% of allowable cost with a $40.00 copay.  Patient to pay $40.00 copay each visit, and 30% of allowable cost.

## 2020-05-09 NOTE — Telephone Encounter (Signed)
I spoke with patient to advise of benefits. Patient would like to call insurance to see how much she will be responsible for before scheduling injections.

## 2020-05-12 ENCOUNTER — Other Ambulatory Visit: Payer: Self-pay

## 2020-05-12 ENCOUNTER — Encounter: Payer: Self-pay | Admitting: Internal Medicine

## 2020-05-12 ENCOUNTER — Other Ambulatory Visit: Payer: Self-pay | Admitting: Internal Medicine

## 2020-05-12 ENCOUNTER — Ambulatory Visit: Payer: Federal, State, Local not specified - PPO | Admitting: Internal Medicine

## 2020-05-12 DIAGNOSIS — E538 Deficiency of other specified B group vitamins: Secondary | ICD-10-CM

## 2020-05-12 DIAGNOSIS — M25511 Pain in right shoulder: Secondary | ICD-10-CM | POA: Diagnosis not present

## 2020-05-12 DIAGNOSIS — G43009 Migraine without aura, not intractable, without status migrainosus: Secondary | ICD-10-CM

## 2020-05-12 DIAGNOSIS — G8929 Other chronic pain: Secondary | ICD-10-CM

## 2020-05-12 DIAGNOSIS — M25512 Pain in left shoulder: Secondary | ICD-10-CM

## 2020-05-12 MED ORDER — PENNSAID 2 % EX SOLN
1.0000 | Freq: Four times a day (QID) | CUTANEOUS | 5 refills | Status: DC | PRN
Start: 2020-05-12 — End: 2020-06-18

## 2020-05-12 MED ORDER — OXYCODONE-ACETAMINOPHEN 5-325 MG PO TABS
1.0000 | ORAL_TABLET | Freq: Two times a day (BID) | ORAL | 0 refills | Status: DC | PRN
Start: 2020-05-12 — End: 2020-07-24

## 2020-05-12 NOTE — Assessment & Plan Note (Signed)
Imitrex, Zofran

## 2020-05-12 NOTE — Patient Instructions (Addendum)
Please call [facility] at [phone].  You can try Lion's Mane Mushroom extract or capsules for memory

## 2020-05-12 NOTE — Assessment & Plan Note (Addendum)
Percocet prn  Potential benefits of a long term opioids use as well as potential risks (i.e. addiction risk, apnea etc) and complications (i.e. Somnolence, constipation and others) were explained to the patient and were aknowledged.  try Lion's Mane Mushroom extract or capsules for memory

## 2020-05-12 NOTE — Progress Notes (Signed)
Subjective:  Patient ID: Karina Newman, female    DOB: 09/13/1955  Age: 64 y.o. MRN: [id number]  CC: No chief complaint on file.   HPI Karina Newman presents for chronic pain, HTN, HAs, B12 def  C/o dry eyes   Outpatient Medications Prior to Visit  Medication Sig Dispense Refill  . amLODipine (NORVASC) 5 MG tablet TAKE 1 TABLET (5 MG TOTAL) BY MOUTH DAILY. NEED OFFICE VISIT FOR FUTURE REFILL. 30 tablet 6  . Aspirin-Caffeine (BAYER BACK & BODY PO) Take by mouth as needed.    . Cyanocobalamin (VITAMIN B-12) 500 MCG SUBL 1 sl qd 100 tablet 5  . cyclobenzaprine (FLEXERIL) 5 MG tablet Take 1 tablet (5 mg total) by mouth 3 (three) times daily as needed for muscle spasms. 30 tablet 1  . Diclofenac Sodium (PENNSAID) 2 % SOLN Place 1 application onto the skin 4 (four) times daily as needed (arthritis). 112 g 5  . dicyclomine (BENTYL) 10 MG capsule Take 1 capsule (10 mg total) by mouth every 8 (eight) hours as needed for spasms. 270 capsule 3  . doxazosin (CARDURA) 4 MG tablet TAKE 1 TABLET BY MOUTH EVERY DAY 90 tablet 2  . fluticasone (FLONASE) 50 MCG/ACT nasal spray Place 1 spray into both nostrils as needed. 48 g 0  . gabapentin (NEURONTIN) 100 MG capsule TAKE 1 CAPSULE BY MOUTH THREE TIMES A DAY AS NEEDED 90 capsule 3  . omeprazole (PRILOSEC) 40 MG capsule Take 1 capsule (40 mg total) by mouth in the morning and at bedtime. 90 capsule 3  . oxyCODONE-acetaminophen (PERCOCET/ROXICET) 5-325 MG tablet Take 1 tablet by mouth 2 (two) times daily as needed for severe pain. 60 tablet 0  . potassium chloride (KLOR-CON) 10 MEQ tablet Take 1 tablet (10 mEq total) by mouth daily. 90 tablet 3  . sucralfate (CARAFATE) 1 g tablet TAKE 1 TABLET BY MOUTH 3 TIMES A DAY BETWEEN MEALS 90 tablet 11  . telmisartan-hydrochlorothiazide (MICARDIS HCT) 80-25 MG tablet Take 1 tablet by mouth daily. 90 tablet 3  . valACYclovir (VALTREX) 500 MG tablet TAKE 1 TABLET (500 MG TOTAL) BY MOUTH 2 (TWO) TIMES DAILY. FOR 5 DAYS  WITH OUTBREAK 30 tablet 1  . Vitamin D, Cholecalciferol, 1000 units CAPS Take 2,000 Units by mouth.    . carvedilol (COREG) 12.5 MG tablet Take 1 tablet (12.5 mg total) by mouth 2 (two) times daily. 180 tablet 0  . clindamycin (CLINDAGEL) 1 % gel APPLY TO AFFECTED AREA TWICE A DAY (Patient not taking: Reported on 05/12/2020) 30 g 0   No facility-administered medications prior to visit.    ROS: Review of Systems  Constitutional: Negative for activity change, appetite change, chills, fatigue and unexpected weight change.  HENT: Negative for congestion, mouth sores and sinus pressure.   Eyes: Negative for visual disturbance.  Respiratory: Negative for cough and chest tightness.   Gastrointestinal: Negative for abdominal pain and nausea.  Genitourinary: Negative for difficulty urinating, frequency and vaginal pain.  Musculoskeletal: Positive for arthralgias, back pain and neck pain. Negative for gait problem.  Skin: Negative for pallor and rash.  Neurological: Negative for dizziness, tremors, weakness, numbness and headaches.  Psychiatric/Behavioral: Negative for confusion, sleep disturbance and suicidal ideas.    Objective:  BP 140/82 (BP Location: Left Arm, Patient Position: Sitting, Cuff Size: Normal)   Pulse 100   Temp 98.5 F (36.9 C) (Oral)   Ht 5' 4.5" (1.638 m)   Wt 174 lb (78.9 kg)   SpO2 99%  BMI 29.41 kg/m   BP Readings from Last 3 Encounters:  05/12/20 140/82  05/06/20 129/87  03/18/20 140/84    Wt Readings from Last 3 Encounters:  05/12/20 174 lb (78.9 kg)  05/06/20 175 lb 9.6 oz (79.7 kg)  03/18/20 173 lb 1.9 oz (78.5 kg)    Physical Exam Constitutional:      General: She is not in acute distress.    Appearance: She is well-developed.  HENT:     Head: Normocephalic.     Right Ear: External ear normal.     Left Ear: External ear normal.     Nose: Nose normal.  Eyes:     General:        Right eye: No discharge.        Left eye: No discharge.      Conjunctiva/sclera: Conjunctivae normal.     Pupils: Pupils are equal, round, and reactive to light.  Neck:     Thyroid: No thyromegaly.     Vascular: No JVD.     Trachea: No tracheal deviation.  Cardiovascular:     Rate and Rhythm: Normal rate and regular rhythm.     Heart sounds: Normal heart sounds.  Pulmonary:     Effort: No respiratory distress.     Breath sounds: No stridor. No wheezing.  Abdominal:     General: Bowel sounds are normal. There is no distension.     Palpations: Abdomen is soft. There is no mass.     Tenderness: There is no abdominal tenderness. There is no guarding or rebound.  Musculoskeletal:        General: Tenderness present.     Cervical back: Normal range of motion and neck supple.  Lymphadenopathy:     Cervical: No cervical adenopathy.  Skin:    Findings: No erythema or rash.  Neurological:     Mental Status: She is oriented to person, place, and time.     Cranial Nerves: No cranial nerve deficit.     Motor: No abnormal muscle tone.     Coordination: Coordination normal.     Deep Tendon Reflexes: Reflexes normal.  Psychiatric:        Behavior: Behavior normal.        Thought Content: Thought content normal.        Judgment: Judgment normal.     Lab Results  Component Value Date   WBC 6.2 07/26/2019   HGB 13.8 07/26/2019   HCT 40.6 07/26/2019   PLT 398.0 07/26/2019   GLUCOSE 105 (H) 07/26/2019   CHOL 188 07/26/2019   TRIG 120.0 07/26/2019   HDL 40.90 07/26/2019   LDLCALC 123 (H) 07/26/2019   ALT 27 07/26/2019   AST 18 07/26/2019   NA 137 07/26/2019   K 4.0 07/26/2019   CL 98 07/26/2019   CREATININE 0.84 07/26/2019   BUN 14 07/26/2019   CO2 29 07/26/2019   TSH 0.39 07/26/2019    US THYROID  Result Date: 10/31/2019 CLINICAL DATA:  Goiter EXAM: THYROID ULTRASOUND TECHNIQUE: Ultrasound examination of the thyroid gland and adjacent soft tissues was performed. COMPARISON:  None. FINDINGS: Parenchymal Echotexture: Mildly heterogenous  Isthmus: 0.5 cm Right lobe: 5.3 x 1.5 x 2.2 cm Left lobe: 5.2 x 1.5 x 2.4 cm _________________________________________________________ Estimated total number of nodules >/= 1 cm: 2 Number of spongiform nodules >/=  2 cm not described below (TR1): 0 Number of mixed cystic and solid nodules >/= 1.5 cm not described below (TR2): 0 _________________________________________________________ Nodule # 1:  Location: Right; Inferior Maximum size: 1.1 cm; Other 2 dimensions: 1.1 x 0.7 cm Composition: solid/almost completely solid (2) Echogenicity: isoechoic (1) Shape: not taller-than-wide (0) Margins: ill-defined (0) Echogenic foci: none (0) ACR TI-RADS total points: 3. ACR TI-RADS risk category: TR3 (3 points). ACR TI-RADS recommendations: Given size (<1.4 cm) and appearance, this nodule does NOT meet TI-RADS criteria for biopsy or dedicated follow-up. _________________________________________________________ Nodule # 2: Location: Left; Inferior Maximum size: 1.2 cm; Other 2 dimensions: 0.9 x 0.6 cm Composition: solid/almost completely solid (2) 6 Echogenicity: hypoechoic (2) Shape: not taller-than-wide (0) Margins: ill-defined (0) Echogenic foci: none (0) ACR TI-RADS total points: 4. ACR TI-RADS risk category: TR4 (4-6 points). ACR TI-RADS recommendations: *Given size (>/= 1 - 1.4 cm) and appearance, a follow-up ultrasound in 1 year should be considered based on TI-RADS criteria. _________________________________________________________ There are few prominent but subcentimeter cervical lymph nodes. IMPRESSION: 1. Mildly enlarged, mildly heterogeneous thyroid gland as detailed above. 2. There is a 1.2 cm TR 4 thyroid nodule in the left inferior thyroid gland. A 1 year follow-up ultrasound is recommended for this thyroid nodule. 3. There is a 1.1 cm TR 3 thyroid nodule in the right inferior thyroid gland. No further follow-up is required for this thyroid nodule. The above is in keeping with the ACR TI-RADS recommendations - J  Am Coll Radiol 2017;14:587-595. Electronically Signed   By: Constance Holster M.D.   On: 10/31/2019 19:26    Assessment & Plan:    Walker Kehr, MD

## 2020-05-12 NOTE — Assessment & Plan Note (Signed)
On B12 

## 2020-05-26 ENCOUNTER — Other Ambulatory Visit: Payer: Self-pay | Admitting: Internal Medicine

## 2020-06-03 ENCOUNTER — Other Ambulatory Visit: Payer: Self-pay | Admitting: Gastroenterology

## 2020-06-16 ENCOUNTER — Other Ambulatory Visit: Payer: Self-pay | Admitting: Internal Medicine

## 2020-06-18 MED ORDER — PENNSAID 2 % EX SOLN
1.0000 | Freq: Four times a day (QID) | CUTANEOUS | 5 refills | Status: DC | PRN
Start: 2020-06-18 — End: 2020-07-24

## 2020-06-18 NOTE — Telephone Encounter (Addendum)
Rec'd fax stating needing clarification/strength unvailable on Diclofenac.Sent new rx for diclofenac.Marland KitchenJohny Chess

## 2020-06-18 NOTE — Addendum Note (Signed)
Addended by: Earnstine Regal on: 06/18/2020 03:04 PM   Modules accepted: Orders

## 2020-06-21 ENCOUNTER — Other Ambulatory Visit: Payer: Self-pay | Admitting: Internal Medicine

## 2020-06-23 ENCOUNTER — Other Ambulatory Visit: Payer: Self-pay | Admitting: Cardiovascular Disease

## 2020-06-24 ENCOUNTER — Other Ambulatory Visit: Payer: Self-pay | Admitting: Internal Medicine

## 2020-06-24 NOTE — Telephone Encounter (Addendum)
   Sadly patient is calling to report her son just died and she is leaving out of town. She is requesting prescription for Diclofenac Sodium 1.5% She states she does not want it filled as Pen[place]aid 2%  Pharmacy:[id number] - [place], [place] [address]

## 2020-06-25 ENCOUNTER — Other Ambulatory Visit: Payer: Self-pay | Admitting: Cardiovascular Disease

## 2020-06-25 MED ORDER — DICLOFONO 1.6 % EX GEL: CUTANEOUS | 2 refills | Status: DC

## 2020-06-25 NOTE — Telephone Encounter (Signed)
Diclofenac 1.5% solution shows that it is not covered. Diclofenac 1.6% gel shows that it is covered Which one? Thanks

## 2020-06-25 NOTE — Telephone Encounter (Signed)
Diclofenac 1.6%.Marland Kitchen [facility]

## 2020-07-01 ENCOUNTER — Ambulatory Visit: Payer: Federal, State, Local not specified - PPO | Admitting: Physician Assistant

## 2020-07-24 ENCOUNTER — Encounter: Payer: Self-pay | Admitting: Internal Medicine

## 2020-07-24 ENCOUNTER — Other Ambulatory Visit: Payer: Self-pay

## 2020-07-24 ENCOUNTER — Ambulatory Visit (INDEPENDENT_AMBULATORY_CARE_PROVIDER_SITE_OTHER): Payer: Medicare Other | Admitting: Internal Medicine

## 2020-07-24 DIAGNOSIS — Z634 Disappearance and death of family member: Secondary | ICD-10-CM | POA: Insufficient documentation

## 2020-07-24 DIAGNOSIS — M25511 Pain in right shoulder: Secondary | ICD-10-CM | POA: Diagnosis not present

## 2020-07-24 DIAGNOSIS — F4321 Adjustment disorder with depressed mood: Secondary | ICD-10-CM | POA: Insufficient documentation

## 2020-07-24 MED ORDER — LORAZEPAM 0.5 MG PO TABS: 0.5000 mg | ORAL_TABLET | Freq: Two times a day (BID) | ORAL | 1 refills | Status: DC | PRN

## 2020-07-24 MED ORDER — TRAZODONE HCL 50 MG PO TABS
50.0000 mg | ORAL_TABLET | Freq: Every day | ORAL | 5 refills | Status: DC
Start: 2020-07-24 — End: 2020-08-17

## 2020-07-24 MED ORDER — OXYCODONE-ACETAMINOPHEN 5-325 MG PO TABS: 1.0000 | ORAL_TABLET | Freq: Two times a day (BID) | ORAL | 0 refills | Status: DC | PRN

## 2020-07-24 MED ORDER — CARVEDILOL 12.5 MG PO TABS: 12.5000 mg | ORAL_TABLET | Freq: Two times a day (BID) | ORAL | 3 refills | Status: DC

## 2020-07-24 MED ORDER — DICLOFENAC SODIUM 1.5 % EX SOLN: CUTANEOUS | 5 refills | Status: DC

## 2020-07-24 NOTE — Assessment & Plan Note (Addendum)
63 yo son died 1 month ago in [place]  - [place] Discussed Lorazepam prn

## 2020-07-24 NOTE — Patient Instructions (Signed)
Hip opener exercises 

## 2020-07-24 NOTE — Assessment & Plan Note (Signed)
Diclofenac sol 1.5% per workman's comp case

## 2020-07-24 NOTE — Progress Notes (Signed)
Subjective:  Patient ID: Karina Newman, female    DOB: [birth year]  Age: 64 y.o. MRN: [id number]  CC: Back Pain (Pt states (L) back pain (R) side and it radiates down her leg)   HPI Karina Newman presents for insomnia, grief - her 60 yo son died 1 month ago in [place]  F/u chronic pain, HTN  Outpatient Medications Prior to Visit  Medication Sig Dispense Refill  . amLODipine (NORVASC) 5 MG tablet TAKE 1 TABLET (5 MG TOTAL) BY MOUTH DAILY. NEED OFFICE VISIT FOR FUTURE REFILL. 90 tablet 2  . Aspirin-Caffeine (BAYER BACK & BODY PO) Take by mouth as needed.    . Cyanocobalamin (VITAMIN B-12) 500 MCG SUBL 1 sl qd 100 tablet 5  . cyclobenzaprine (FLEXERIL) 5 MG tablet TAKE 1 TABLET BY MOUTH THREE TIMES A DAY AS NEEDED FOR MUSCLE SPASMS 60 tablet 0  . Diclofenac Sodium (DICLOFONO) 1.6 % GEL Place 1 application onto the skin four times a day as needed for arthritis pain 2.5 g 2  . Diclofenac Sodium (PENNSAID) 2 % SOLN Place 1 application onto the skin 4 (four) times daily as needed (arthritis). 112 g 5  . dicyclomine (BENTYL) 10 MG capsule Take 1 capsule (10 mg total) by mouth every 8 (eight) hours as needed for spasms. 270 capsule 3  . doxazosin (CARDURA) 4 MG tablet TAKE 1 TABLET BY MOUTH EVERY DAY 90 tablet 2  . fluticasone (FLONASE) 50 MCG/ACT nasal spray PLACE 1 SPRAY INTO BOTH NOSTRILS AS NEEDED. 48 mL 3  . gabapentin (NEURONTIN) 100 MG capsule TAKE 1 CAPSULE BY MOUTH THREE TIMES A DAY AS NEEDED 90 capsule 3  . omeprazole (PRILOSEC) 40 MG capsule TAKE 1 CAPSULE (40 MG TOTAL) BY MOUTH IN THE MORNING AND AT BEDTIME. 180 capsule 1  . oxyCODONE-acetaminophen (PERCOCET/ROXICET) 5-325 MG tablet Take 1 tablet by mouth 2 (two) times daily as needed for severe pain. 60 tablet 0  . potassium chloride (KLOR-CON) 10 MEQ tablet Take 1 tablet (10 mEq total) by mouth daily. 90 tablet 3  . sucralfate (CARAFATE) 1 g tablet TAKE 1 TABLET BY MOUTH 3 TIMES A DAY BETWEEN MEALS 90 tablet 11  .  telmisartan-hydrochlorothiazide (MICARDIS HCT) 80-25 MG tablet Take 1 tablet by mouth daily. 90 tablet 3  . valACYclovir (VALTREX) 500 MG tablet TAKE 1 TABLET (500 MG TOTAL) BY MOUTH 2 (TWO) TIMES DAILY. FOR 5 DAYS WITH OUTBREAK 30 tablet 1  . Vitamin D, Cholecalciferol, 1000 units CAPS Take 2,000 Units by mouth.    . carvedilol (COREG) 12.5 MG tablet Take 1 tablet (12.5 mg total) by mouth 2 (two) times daily. 180 tablet 0  . clindamycin (CLINDAGEL) 1 % gel APPLY TO AFFECTED AREA TWICE A DAY (Patient not taking: No sig reported) 30 g 0   No facility-administered medications prior to visit.    ROS: Review of Systems  Constitutional: Positive for fatigue. Negative for activity change, appetite change, chills and unexpected weight change.  HENT: Negative for congestion, mouth sores and sinus pressure.   Eyes: Negative for visual disturbance.  Respiratory: Negative for cough and chest tightness.   Gastrointestinal: Negative for abdominal pain and nausea.  Genitourinary: Negative for difficulty urinating, frequency and vaginal pain.  Musculoskeletal: Positive for arthralgias, back pain, neck pain and neck stiffness. Negative for gait problem.  Skin: Negative for pallor and rash.  Neurological: Negative for dizziness, tremors, weakness, numbness and headaches.  Psychiatric/Behavioral: Positive for decreased concentration and sleep disturbance. Negative for confusion and suicidal ideas.  The patient is nervous/anxious.     Objective:  BP (!) 148/80 (BP Location: Left Arm)   Pulse 87   Temp 99.2 F (37.3 C) (Oral)   Wt 170 lb 9.6 oz (77.4 kg)   SpO2 97%   BMI 28.83 kg/m   BP Readings from Last 3 Encounters:  07/24/20 (!) 148/80  05/12/20 140/82  05/06/20 129/87    Wt Readings from Last 3 Encounters:  07/24/20 170 lb 9.6 oz (77.4 kg)  05/12/20 174 lb (78.9 kg)  05/06/20 175 lb 9.6 oz (79.7 kg)    Physical Exam Constitutional:      General: She is not in acute distress.     Appearance: She is well-developed.  HENT:     Head: Normocephalic.     Right Ear: External ear normal.     Left Ear: External ear normal.     Nose: Nose normal.     Mouth/Throat:     Mouth: Oropharynx is clear and moist.  Eyes:     General:        Right eye: No discharge.        Left eye: No discharge.     Conjunctiva/sclera: Conjunctivae normal.     Pupils: Pupils are equal, round, and reactive to light.  Neck:     Thyroid: No thyromegaly.     Vascular: No JVD.     Trachea: No tracheal deviation.  Cardiovascular:     Rate and Rhythm: Normal rate and regular rhythm.     Heart sounds: Normal heart sounds.  Pulmonary:     Effort: No respiratory distress.     Breath sounds: No stridor. No wheezing.  Abdominal:     General: Bowel sounds are normal. There is no distension.     Palpations: Abdomen is soft. There is no mass.     Tenderness: There is no abdominal tenderness. There is no guarding or rebound.  Musculoskeletal:        General: Tenderness present. No edema.     Cervical back: Normal range of motion and neck supple.  Lymphadenopathy:     Cervical: No cervical adenopathy.  Skin:    Findings: No erythema or rash.  Neurological:     Mental Status: She is oriented to person, place, and time.     Cranial Nerves: No cranial nerve deficit.     Motor: No abnormal muscle tone.     Coordination: Coordination normal.     Deep Tendon Reflexes: Reflexes normal.  Psychiatric:        Mood and Affect: Mood and affect normal.        Behavior: Behavior normal.        Thought Content: Thought content normal.        Judgment: Judgment normal.   sad Shoulder - painful  Lab Results  Component Value Date   WBC 6.2 07/26/2019   HGB 13.8 07/26/2019   HCT 40.6 07/26/2019   PLT 398.0 07/26/2019   GLUCOSE 105 (H) 07/26/2019   CHOL 188 07/26/2019   TRIG 120.0 07/26/2019   HDL 40.90 07/26/2019   LDLCALC 123 (H) 07/26/2019   ALT 27 07/26/2019   AST 18 07/26/2019   NA 137  07/26/2019   K 4.0 07/26/2019   CL 98 07/26/2019   CREATININE 0.84 07/26/2019   BUN 14 07/26/2019   CO2 29 07/26/2019   TSH 0.39 07/26/2019    US THYROID  Result Date: 10/31/2019 CLINICAL DATA:  Goiter EXAM: THYROID ULTRASOUND TECHNIQUE: Ultrasound examination  of the thyroid gland and adjacent soft tissues was performed. COMPARISON:  None. FINDINGS: Parenchymal Echotexture: Mildly heterogenous Isthmus: 0.5 cm Right lobe: 5.3 x 1.5 x 2.2 cm Left lobe: 5.2 x 1.5 x 2.4 cm _________________________________________________________ Estimated total number of nodules >/= 1 cm: 2 Number of spongiform nodules >/=  2 cm not described below (TR1): 0 Number of mixed cystic and solid nodules >/= 1.5 cm not described below ([place]): 0 _________________________________________________________ Nodule # 1: Location: Right; Inferior Maximum size: 1.1 cm; Other 2 dimensions: 1.1 x 0.7 cm Composition: solid/almost completely solid (2) Echogenicity: isoechoic (1) Shape: not taller-than-wide (0) Margins: ill-defined (0) Echogenic foci: none (0) ACR TI-RADS total points: 3. ACR TI-RADS risk category: TR3 (3 points). ACR TI-RADS recommendations: Given size (<1.4 cm) and appearance, this nodule does NOT meet TI-RADS criteria for biopsy or dedicated follow-up. _________________________________________________________ Nodule # 2: Location: Left; Inferior Maximum size: 1.2 cm; Other 2 dimensions: 0.9 x 0.6 cm Composition: solid/almost completely solid (2) 6 Echogenicity: hypoechoic (2) Shape: not taller-than-wide (0) Margins: ill-defined (0) Echogenic foci: none (0) ACR TI-RADS total points: 4. ACR TI-RADS risk category: TR4 (4-6 points). ACR TI-RADS recommendations: *Given size (>/= 1 - 1.4 cm) and appearance, a follow-up ultrasound in 1 year should be considered based on TI-RADS criteria. _________________________________________________________ There are few prominent but subcentimeter cervical lymph nodes. IMPRESSION: 1. Mildly  enlarged, mildly heterogeneous thyroid gland as detailed above. 2. There is a 1.2 cm TR 4 thyroid nodule in the left inferior thyroid gland. A 1 year follow-up ultrasound is recommended for this thyroid nodule. 3. There is a 1.1 cm TR 3 thyroid nodule in the right inferior thyroid gland. No further follow-up is required for this thyroid nodule. The above is in keeping with the ACR TI-RADS recommendations - J Am Coll Radiol 2017;14:587-595. Electronically Signed   By: Constance Holster M.D.   On: 10/31/2019 19:26    Assessment & Plan:   There are no diagnoses linked to this encounter.   No orders of the defined types were placed in this encounter.    Follow-up: No follow-ups on file.  Walker Kehr, MD

## 2020-07-28 ENCOUNTER — Telehealth: Payer: Self-pay | Admitting: *Deleted

## 2020-07-28 NOTE — Telephone Encounter (Signed)
Rec'd PA for pt oxycodone 5/325 mg. Completed via cover-my-meds w/ (Key: [id number]). Rec'd msg stating " Your information has been submitted to [name]. To check for an updated outcome later, reopen this PA request from your dashboard. If [employer] has not responded to your request within 24 hours, contact [employer] at [phone]"...Karina Newman

## 2020-08-04 ENCOUNTER — Ambulatory Visit: Payer: Medicare Other | Admitting: Internal Medicine

## 2020-08-15 ENCOUNTER — Other Ambulatory Visit: Payer: Self-pay | Admitting: Internal Medicine

## 2020-08-21 ENCOUNTER — Other Ambulatory Visit: Payer: Self-pay | Admitting: Orthopaedic Surgery

## 2020-08-21 DIAGNOSIS — G8929 Other chronic pain: Secondary | ICD-10-CM

## 2020-08-21 DIAGNOSIS — M25511 Pain in right shoulder: Secondary | ICD-10-CM

## 2020-09-01 ENCOUNTER — Other Ambulatory Visit: Payer: Self-pay | Admitting: Internal Medicine

## 2020-09-12 ENCOUNTER — Other Ambulatory Visit: Payer: Self-pay | Admitting: Internal Medicine

## 2020-09-16 ENCOUNTER — Other Ambulatory Visit: Payer: Self-pay | Admitting: Internal Medicine

## 2020-09-17 ENCOUNTER — Other Ambulatory Visit: Payer: Self-pay

## 2020-09-17 ENCOUNTER — Encounter: Payer: Self-pay | Admitting: Internal Medicine

## 2020-09-17 ENCOUNTER — Ambulatory Visit (INDEPENDENT_AMBULATORY_CARE_PROVIDER_SITE_OTHER): Payer: Medicare Other | Admitting: Internal Medicine

## 2020-09-17 VITALS — BP 142/82 | HR 91 | Temp 98.4°F | Wt 171.0 lb

## 2020-09-17 DIAGNOSIS — M79674 Pain in right toe(s): Secondary | ICD-10-CM

## 2020-09-17 DIAGNOSIS — R0789 Other chest pain: Secondary | ICD-10-CM

## 2020-09-17 DIAGNOSIS — I10 Essential (primary) hypertension: Secondary | ICD-10-CM

## 2020-09-17 DIAGNOSIS — E538 Deficiency of other specified B group vitamins: Secondary | ICD-10-CM

## 2020-09-17 DIAGNOSIS — F4321 Adjustment disorder with depressed mood: Secondary | ICD-10-CM

## 2020-09-17 DIAGNOSIS — Z634 Disappearance and death of family member: Secondary | ICD-10-CM | POA: Diagnosis not present

## 2020-09-17 DIAGNOSIS — E559 Vitamin D deficiency, unspecified: Secondary | ICD-10-CM | POA: Diagnosis not present

## 2020-09-17 DIAGNOSIS — G8929 Other chronic pain: Secondary | ICD-10-CM | POA: Diagnosis not present

## 2020-09-17 DIAGNOSIS — R002 Palpitations: Secondary | ICD-10-CM

## 2020-09-17 MED ORDER — ESCITALOPRAM OXALATE 5 MG PO TABS: 5.0000 mg | ORAL_TABLET | Freq: Every day | ORAL | 5 refills | Status: DC

## 2020-09-17 MED ORDER — LORAZEPAM 0.5 MG PO TABS: 0.5000 mg | ORAL_TABLET | Freq: Two times a day (BID) | ORAL | 1 refills | Status: DC | PRN

## 2020-09-17 MED ORDER — OXYCODONE-ACETAMINOPHEN 5-325 MG PO TABS: 1.0000 | ORAL_TABLET | Freq: Two times a day (BID) | ORAL | 0 refills | Status: DC | PRN

## 2020-09-17 NOTE — Assessment & Plan Note (Addendum)
Worse.  She has been very depressed.  Start Lexapro

## 2020-09-17 NOTE — Assessment & Plan Note (Signed)
Amlodipine Micardis HCT 

## 2020-09-17 NOTE — Assessment & Plan Note (Signed)
On Vit D 

## 2020-09-17 NOTE — Progress Notes (Signed)
Subjective:  Patient ID: Karina Newman, female    DOB: 08-Jul-1956  Age: 65 y.o. MRN: [id number]  CC: Depression (F/u on anxiety/depression. Pt states she been having heart palpitation and tightness feeling)   HPI Karina Newman presents for chronic pain, hypertension, anxiety.  She is complaining of heart palpitations and tightness feeling in the chest that she thinks is related to stress.  She is grieving her son's death.  She is complaining of pain in the toes.  Outpatient Medications Prior to Visit  Medication Sig Dispense Refill  . amLODipine (NORVASC) 5 MG tablet TAKE 1 TABLET (5 MG TOTAL) BY MOUTH DAILY. NEED OFFICE VISIT FOR FUTURE REFILL. 90 tablet 2  . Aspirin-Caffeine (BAYER BACK & BODY PO) Take by mouth as needed.    . carvedilol (COREG) 12.5 MG tablet Take 1 tablet (12.5 mg total) by mouth 2 (two) times daily. 180 tablet 3  . Cyanocobalamin (VITAMIN B-12) 500 MCG SUBL 1 sl qd 100 tablet 5  . cyclobenzaprine (FLEXERIL) 5 MG tablet TAKE 1 TABLET BY MOUTH THREE TIMES A DAY AS NEEDED FOR MUSCLE SPASMS 60 tablet 0  . Diclofenac Sodium 1.5 % SOLN Use 15 gtt tid externally for pain 150 mL 5  . dicyclomine (BENTYL) 10 MG capsule Take 1 capsule (10 mg total) by mouth every 8 (eight) hours as needed for spasms. 270 capsule 3  . doxazosin (CARDURA) 4 MG tablet TAKE 1 TABLET BY MOUTH EVERY DAY 90 tablet 2  . fluticasone (FLONASE) 50 MCG/ACT nasal spray PLACE 1 SPRAY INTO BOTH NOSTRILS AS NEEDED. 48 mL 3  . gabapentin (NEURONTIN) 100 MG capsule TAKE 1 CAPSULE BY MOUTH THREE TIMES A DAY AS NEEDED 90 capsule 3  . LORazepam (ATIVAN) 0.5 MG tablet Take 1 tablet (0.5 mg total) by mouth 2 (two) times daily as needed for anxiety or sleep. 60 tablet 1  . omeprazole (PRILOSEC) 40 MG capsule TAKE 1 CAPSULE (40 MG TOTAL) BY MOUTH IN THE MORNING AND AT BEDTIME. 180 capsule 1  . oxyCODONE-acetaminophen (PERCOCET/ROXICET) 5-325 MG tablet Take 1 tablet by mouth 2 (two) times daily as needed for severe pain.  60 tablet 0  . potassium chloride (KLOR-CON) 10 MEQ tablet Take 1 tablet (10 mEq total) by mouth daily. 90 tablet 3  . sucralfate (CARAFATE) 1 g tablet TAKE 1 TABLET BY MOUTH 3 TIMES A DAY BETWEEN MEALS 270 tablet 3  . telmisartan-hydrochlorothiazide (MICARDIS HCT) 80-25 MG tablet Take 1 tablet by mouth daily. 90 tablet 3  . traZODone (DESYREL) 50 MG tablet TAKE 1 TABLET BY MOUTH EVERYDAY AT BEDTIME 90 tablet 3  . valACYclovir (VALTREX) 500 MG tablet TAKE 1 TABLET (500 MG TOTAL) BY MOUTH 2 (TWO) TIMES DAILY. FOR 5 DAYS WITH OUTBREAK 30 tablet 1  . Vitamin D, Cholecalciferol, 1000 units CAPS Take 2,000 Units by mouth.     No facility-administered medications prior to visit.    ROS: Review of Systems  Constitutional: Negative for activity change, appetite change, chills, diaphoresis, fatigue, fever and unexpected weight change.  HENT: Negative for congestion, dental problem, ear pain, hearing loss, mouth sores, postnasal drip, sinus pressure, sneezing, sore throat and voice change.   Eyes: Negative for pain and visual disturbance.  Respiratory: Negative for cough, chest tightness, wheezing and stridor.   Cardiovascular: Negative for chest pain, palpitations and leg swelling.  Gastrointestinal: Negative for abdominal distention, abdominal pain, blood in stool, nausea, rectal pain and vomiting.  Genitourinary: Negative for decreased urine volume, difficulty urinating, dysuria, frequency, hematuria,  menstrual problem, vaginal bleeding, vaginal discharge and vaginal pain.  Musculoskeletal: Positive for arthralgias. Negative for back pain, gait problem, joint swelling and neck pain.  Skin: Negative for color change, rash and wound.  Neurological: Negative for dizziness, tremors, syncope, speech difficulty, weakness and light-headedness.  Hematological: Negative for adenopathy.  Psychiatric/Behavioral: Negative for behavioral problems, confusion, decreased concentration, dysphoric mood,  hallucinations, sleep disturbance and suicidal ideas. The patient is nervous/anxious. The patient is not hyperactive.     Objective:  BP (!) 142/82 (BP Location: Left Arm)   Pulse 91   Temp 98.4 F (36.9 C) (Oral)   Wt 171 lb (77.6 kg)   SpO2 97%   BMI 28.90 kg/m   BP Readings from Last 3 Encounters:  09/17/20 (!) 142/82  07/24/20 (!) 148/80  05/12/20 140/82    Wt Readings from Last 3 Encounters:  09/17/20 171 lb (77.6 kg)  07/24/20 170 lb 9.6 oz (77.4 kg)  05/12/20 174 lb (78.9 kg)    Physical Exam Constitutional:      General: She is not in acute distress.    Appearance: She is well-developed.  HENT:     Head: Normocephalic.     Right Ear: External ear normal.     Left Ear: External ear normal.     Nose: Nose normal.     Mouth/Throat:     Mouth: Oropharynx is clear and moist.  Eyes:     General:        Right eye: No discharge.        Left eye: No discharge.     Conjunctiva/sclera: Conjunctivae normal.     Pupils: Pupils are equal, round, and reactive to light.  Neck:     Thyroid: No thyromegaly.     Vascular: No JVD.     Trachea: No tracheal deviation.  Cardiovascular:     Rate and Rhythm: Normal rate and regular rhythm.     Heart sounds: Normal heart sounds.  Pulmonary:     Effort: No respiratory distress.     Breath sounds: No stridor. No wheezing.  Abdominal:     General: Bowel sounds are normal. There is no distension.     Palpations: Abdomen is soft. There is no mass.     Tenderness: There is no abdominal tenderness. There is no guarding or rebound.  Musculoskeletal:        General: No tenderness or edema.     Cervical back: Normal range of motion and neck supple.  Lymphadenopathy:     Cervical: No cervical adenopathy.  Skin:    Findings: No erythema or rash.  Neurological:     Mental Status: She is oriented to person, place, and time.     Cranial Nerves: No cranial nerve deficit.     Motor: No abnormal muscle tone.     Coordination:  Coordination normal.     Deep Tendon Reflexes: Reflexes normal.  Psychiatric:        Mood and Affect: Mood and affect normal.        Behavior: Behavior normal.        Thought Content: Thought content normal.        Judgment: Judgment normal.    Procedure: EKG Indication: chest pain Impression: NSR. No acute changes.  Lab Results  Component Value Date   WBC 6.2 07/26/2019   HGB 13.8 07/26/2019   HCT 40.6 07/26/2019   PLT 398.0 07/26/2019   GLUCOSE 105 (H) 07/26/2019   CHOL 188 07/26/2019   TRIG  120.0 07/26/2019   HDL 40.90 07/26/2019   LDLCALC 123 (H) 07/26/2019   ALT 27 07/26/2019   AST 18 07/26/2019   NA 137 07/26/2019   K 4.0 07/26/2019   CL 98 07/26/2019   CREATININE 0.84 07/26/2019   BUN 14 07/26/2019   CO2 29 07/26/2019   TSH 0.39 07/26/2019    US THYROID  Result Date: 10/31/2019 CLINICAL DATA:  Goiter EXAM: THYROID ULTRASOUND TECHNIQUE: Ultrasound examination of the thyroid gland and adjacent soft tissues was performed. COMPARISON:  None. FINDINGS: Parenchymal Echotexture: Mildly heterogenous Isthmus: 0.5 cm Right lobe: 5.3 x 1.5 x 2.2 cm Left lobe: 5.2 x 1.5 x 2.4 cm _________________________________________________________ Estimated total number of nodules >/= 1 cm: 2 Number of spongiform nodules >/=  2 cm not described below (TR1): 0 Number of mixed cystic and solid nodules >/= 1.5 cm not described below ([place]): 0 _________________________________________________________ Nodule # 1: Location: Right; Inferior Maximum size: 1.1 cm; Other 2 dimensions: 1.1 x 0.7 cm Composition: solid/almost completely solid (2) Echogenicity: isoechoic (1) Shape: not taller-than-wide (0) Margins: ill-defined (0) Echogenic foci: none (0) ACR TI-RADS total points: 3. ACR TI-RADS risk category: TR3 (3 points). ACR TI-RADS recommendations: Given size (<1.4 cm) and appearance, this nodule does NOT meet TI-RADS criteria for biopsy or dedicated follow-up.  _________________________________________________________ Nodule # 2: Location: Left; Inferior Maximum size: 1.2 cm; Other 2 dimensions: 0.9 x 0.6 cm Composition: solid/almost completely solid (2) 6 Echogenicity: hypoechoic (2) Shape: not taller-than-wide (0) Margins: ill-defined (0) Echogenic foci: none (0) ACR TI-RADS total points: 4. ACR TI-RADS risk category: TR4 (4-6 points). ACR TI-RADS recommendations: *Given size (>/= 1 - 1.4 cm) and appearance, a follow-up ultrasound in 1 year should be considered based on TI-RADS criteria. _________________________________________________________ There are few prominent but subcentimeter cervical lymph nodes. IMPRESSION: 1. Mildly enlarged, mildly heterogeneous thyroid gland as detailed above. 2. There is a 1.2 cm TR 4 thyroid nodule in the left inferior thyroid gland. A 1 year follow-up ultrasound is recommended for this thyroid nodule. 3. There is a 1.1 cm TR 3 thyroid nodule in the right inferior thyroid gland. No further follow-up is required for this thyroid nodule. The above is in keeping with the ACR TI-RADS recommendations - J Am Coll Radiol 2017;14:587-595. Electronically Signed   By: Constance Holster M.D.   On: 10/31/2019 19:26    Assessment & Plan:   Blossom was seen today for depression.  Diagnoses and all orders for this visit:  Palpitation -     EKG 12-Lead     No orders of the defined types were placed in this encounter.    Follow-up: No follow-ups on file.  Walker Kehr, MD

## 2020-09-17 NOTE — Assessment & Plan Note (Signed)
On B12 

## 2020-09-17 NOTE — Assessment & Plan Note (Signed)
R toenail lesion/ pain Podiatry ref

## 2020-09-19 ENCOUNTER — Telehealth: Payer: Self-pay | Admitting: *Deleted

## 2020-09-19 NOTE — Telephone Encounter (Signed)
Rec'd PA status med was partially favorable. It states The drug oxycodone/acetaminophen is subject to an opiod-benzodiazprine combination therapy safety exception and also a 7 day supply limit exception. You have satisfied the 7 day supply limit exception under Medicare Part D however have not been met for ongoing therapy

## 2020-09-19 NOTE — Telephone Encounter (Signed)
Rec'd fax pt needing PA for Oxycodone 5/325 mg. Completed vis cover-my-meds w/ (Key: [id number]). Rec'd msg stating PA has been submitted to insurance. Will check back later for approval status.Marland KitchenJohny Chess

## 2020-09-21 NOTE — Assessment & Plan Note (Signed)
The symptoms are likely related to grieving.  We will start Lexapro.  EKG obtained

## 2020-09-24 ENCOUNTER — Other Ambulatory Visit: Payer: Self-pay | Admitting: Internal Medicine

## 2020-09-25 ENCOUNTER — Other Ambulatory Visit: Payer: Self-pay

## 2020-09-25 ENCOUNTER — Ambulatory Visit (INDEPENDENT_AMBULATORY_CARE_PROVIDER_SITE_OTHER): Payer: Medicare Other

## 2020-09-25 ENCOUNTER — Encounter: Payer: Self-pay | Admitting: Sports Medicine

## 2020-09-25 ENCOUNTER — Ambulatory Visit (INDEPENDENT_AMBULATORY_CARE_PROVIDER_SITE_OTHER): Payer: Medicare Other | Admitting: Sports Medicine

## 2020-09-25 DIAGNOSIS — M722 Plantar fascial fibromatosis: Secondary | ICD-10-CM

## 2020-09-25 DIAGNOSIS — L989 Disorder of the skin and subcutaneous tissue, unspecified: Secondary | ICD-10-CM

## 2020-09-25 DIAGNOSIS — L608 Other nail disorders: Secondary | ICD-10-CM

## 2020-09-25 DIAGNOSIS — C4371 Malignant melanoma of right lower limb, including hip: Secondary | ICD-10-CM | POA: Diagnosis not present

## 2020-09-25 DIAGNOSIS — M216X2 Other acquired deformities of left foot: Secondary | ICD-10-CM

## 2020-09-25 DIAGNOSIS — M216X1 Other acquired deformities of right foot: Secondary | ICD-10-CM | POA: Diagnosis not present

## 2020-09-25 NOTE — Progress Notes (Signed)
Subjective: Karina Newman is a 65 y.o. female patient presents to office with complaint of moderate heel pain on the left. Patient admits to pain and stiffness in both feet with pain worse at night. Patient also reports that she went on a trip in Oct and did a lot of walking and had soreness at her right great toenail and then after had a lot of pain and noticed a red spot to the nail. Has tried to wear comfortable shoes but sometimes it still hurts worse at night. Patient also reports that she has some pain at her big toe joint that goes up her foot on the left.  Review of Systems  All other systems reviewed and are negative.    Patient Active Problem List   Diagnosis Date Noted  . Toe pain, chronic, right 09/17/2020  . Grief at loss of child 07/24/2020  . Dysphagia 01/02/2020  . Globus sensation 01/02/2020  . Thoracic back pain 12/19/2019  . Sore throat 10/24/2019  . Goiter 10/24/2019  . Cough 10/24/2019  . Neoplasm of uncertain behavior of skin 09/27/2019  . Pain of right breast 06/03/2019  . Fatty liver 01/23/2019  . Hand pain 09/28/2018  . Knee pain 09/28/2018  . Inappropriate sinus tachycardia 05/17/2018  . Apnea 05/17/2018  . Upper respiratory infection 09/23/2017  . Fatigue 09/12/2017  . Well adult exam 01/19/2017  . Abdominal pain 01/19/2017  . Headache 10/27/2016  . Pain in right shoulder 09/16/2016  . Left hip pain 09/16/2016  . Low vitamin B12 level 03/16/2016  . Piriformis syndrome of right side 02/04/2016  . IT band syndrome 02/04/2016  . RUQ abdominal pain 02/04/2016  . Low back pain radiating to right lower extremity 09/19/2015  . Allergic rhinitis 09/19/2015  . Atypical chest pain 04/07/2015  . GERD (gastroesophageal reflux disease) 04/07/2015  . Axillary adenitis 03/23/2015  . Migraine headache 03/23/2015  . Hives 01/29/2015  . Edema 01/29/2015  . Cervical disc disorder with radiculopathy of cervical region 12/11/2014  . Insomnia 07/31/2014  .  Degenerative cervical disc 05/08/2014  . Pain in joint, shoulder region 03/05/2014  . Patellofemoral arthralgia of both knees 03/01/2014  . Bilateral shoulder pain 10/18/2013  . Sinusitis, bacterial 08/20/2013  . URI, acute 08/14/2013  . Hemoptysis 08/14/2013  . Otitis media of left ear 08/14/2013  . Labral tear of shoulder 07/03/2013  . Hyperthyroidism 07/03/2013  . HTN (hypertension), benign 07/03/2013  . LVH (left ventricular hypertrophy) due to hypertensive disease 07/03/2013  . Vitamin D deficiency 07/03/2013    Current Outpatient Medications on File Prior to Visit  Medication Sig Dispense Refill  . amLODipine (NORVASC) 5 MG tablet TAKE 1 TABLET (5 MG TOTAL) BY MOUTH DAILY. NEED OFFICE VISIT FOR FUTURE REFILL. 90 tablet 2  . Aspirin-Caffeine (BAYER BACK & BODY PO) Take by mouth as needed.    . carvedilol (COREG) 12.5 MG tablet Take 1 tablet (12.5 mg total) by mouth 2 (two) times daily. 180 tablet 3  . Cyanocobalamin (VITAMIN B-12) 500 MCG SUBL 1 sl qd 100 tablet 5  . cyclobenzaprine (FLEXERIL) 5 MG tablet TAKE 1 TABLET BY MOUTH THREE TIMES A DAY AS NEEDED FOR MUSCLE SPASMS 60 tablet 0  . Diclofenac Sodium 1.5 % SOLN Use 15 gtt tid externally for pain 150 mL 5  . dicyclomine (BENTYL) 10 MG capsule Take 1 capsule (10 mg total) by mouth every 8 (eight) hours as needed for spasms. 270 capsule 3  . doxazosin (CARDURA) 4 MG tablet TAKE 1 TABLET  BY MOUTH EVERY DAY 90 tablet 2  . escitalopram (LEXAPRO) 5 MG tablet Take 1 tablet (5 mg total) by mouth daily. 30 tablet 5  . fluticasone (FLONASE) 50 MCG/ACT nasal spray PLACE 1 SPRAY INTO BOTH NOSTRILS AS NEEDED. 48 mL 3  . gabapentin (NEURONTIN) 100 MG capsule TAKE 1 CAPSULE BY MOUTH THREE TIMES A DAY AS NEEDED 90 capsule 3  . LORazepam (ATIVAN) 0.5 MG tablet Take 1 tablet (0.5 mg total) by mouth 2 (two) times daily as needed for anxiety or sleep. 60 tablet 1  . omeprazole (PRILOSEC) 40 MG capsule TAKE 1 CAPSULE (40 MG TOTAL) BY MOUTH IN THE  MORNING AND AT BEDTIME. 180 capsule 1  . oxyCODONE-acetaminophen (PERCOCET/ROXICET) 5-325 MG tablet Take 1 tablet by mouth 2 (two) times daily as needed for severe pain. 60 tablet 0  . potassium chloride (KLOR-CON) 10 MEQ tablet Take 1 tablet (10 mEq total) by mouth daily. 90 tablet 3  . sucralfate (CARAFATE) 1 g tablet TAKE 1 TABLET BY MOUTH 3 TIMES A DAY BETWEEN MEALS 270 tablet 3  . telmisartan-hydrochlorothiazide (MICARDIS HCT) 80-25 MG tablet Take 1 tablet by mouth daily. 90 tablet 3  . traZODone (DESYREL) 50 MG tablet TAKE 1 TABLET BY MOUTH EVERYDAY AT BEDTIME 90 tablet 3  . valACYclovir (VALTREX) 500 MG tablet TAKE 1 TABLET (500 MG TOTAL) BY MOUTH 2 (TWO) TIMES DAILY. FOR 5 DAYS WITH OUTBREAK 30 tablet 1  . Vitamin D, Cholecalciferol, 1000 units CAPS Take 2,000 Units by mouth.     No current facility-administered medications on file prior to visit.    Allergies  Allergen Reactions  . Penicillins Hives  . Tramadol Anaphylaxis    Memory lapses Memory lapses  . Gabapentin     Nausea   . Iodine Other (See Comments)    Due to hyperthyroidism  . Lyrica [Pregabalin] Dermatitis  . Nsaids     gastritis  . Voltaren [Diclofenac] Hives    Objective: Physical Exam General: The patient is alert and oriented x3 in no acute distress.  Dermatology: Skin is warm, dry and supple bilateral lower extremities. Nails 1-10 are normal except base of right hallux has heme that is dry and stable likely from previous nail injury for shoe. There is no erythema, edema, no eccymosis, no open lesions present. At plantar lateral right foot there is a hyperpigmented lesion that per patient has been gettig larger in size that appear symmetrical with no redness, warmth, or pain, ?nevus vs melanoma, Integument is otherwise unremarkable.   Vascular: Dorsalis Pedis pulse and Posterior Tibial pulse are 2/4 bilateral. Capillary fill time is immediate to all digits.  Neurological: Grossly intact to light touch  bilateral.   Musculoskeletal: Tenderness to palpation at the medial calcaneal tubercale and through the insertion of the plantar fascia on the left>right foot. No pain with compression of calcaneus bilateral. No pain with tuning fork to calcaneus bilateral. No pain with calf compression bilateral. There is decreased Ankle joint range of motion bilateral. ++Bunion Left>right. All other joints range of motion within normal limits bilateral. Strength 5/5 in all groups bilateral.   Xray, Right/Left foot:  Normal osseous mineralization. Joint spaces preserved. No fracture/dislocation/boney destruction. Calcaneal spur present with mild thickening of plantar fascia. No other soft tissue abnormalities or radiopaque foreign bodies.   Assessment and Plan: Problem List Items Addressed This Visit   None   Visit Diagnoses    Plantar fasciitis, bilateral    -  Primary   Relevant Orders  DG Foot Complete Left   DG Foot Complete Right   Lesion of skin of foot       Relevant Orders   Ambulatory referral to Dermatology   Melanoma of foot, right [facility])       Relevant Orders   Ambulatory referral to Dermatology   Plantar fasciitis, left       Acquired equinus deformity of both feet       Hemorrhage of nail          -Complete examination performed.  -Xrays reviewed -Discussed with patient in detail the condition of plantar fasciitis L>R, how this occurs and general treatment options. Explained both conservative and surgical treatments.  -Recommended good supportive shoes and advised use of OTC heel lifts as provided - Explained in detail the use of the night splint (1) which was dispensed at today's visit. -Explained and dispensed to patient daily stretching exercises. -Recommend patient to ice affected area 1-2x daily. -Discussed treatment for toe pain; dispensed to cap to use at instructed on right  -Discussed treatment options for nail hemorraghe, dry and stable continue to monitor -Discussed  treatment for hyperpigmented lesion; refer to dermatology for opinion  -Patient to return to office in 4 weeks for follow up or sooner if problems or questions arise.  Landis Martins, DPM

## 2020-09-25 NOTE — Patient Instructions (Signed)
Plantar Fasciitis Rehab Ask your health care provider which exercises are safe for you. Do exercises exactly as told by your health care provider and adjust them as directed. It is normal to feel mild stretching, pulling, tightness, or discomfort as you do these exercises. Stop right away if you feel sudden pain or your pain gets worse. Do not begin these exercises until told by your health care provider. Stretching and range-of-motion exercises These exercises warm up your muscles and joints and improve the movement and flexibility of your foot. These exercises also help to relieve pain. Plantar fascia stretch 1. Sit with your left / right leg crossed over your opposite knee. 2. Hold your heel with one hand with that thumb near your arch. With your other hand, hold your toes and gently pull them back toward the top of your foot. You should feel a stretch on the base (bottom) of your toes, or the bottom of your foot (plantar fascia), or both. 3. Hold this stretch for__________ seconds. 4. Slowly release your toes and return to the starting position. Repeat __________ times. Complete this exercise __________ times a day.   Gastrocnemius stretch, standing This exercise is also called a calf (gastroc) stretch. It stretches the muscles in the back of the upper calf. 1. Stand with your hands against a wall. 2. Extend your left / right leg behind you, and bend your front knee slightly. 3. Keeping your heels on the floor, your toes facing forward, and your back knee straight, shift your weight toward the wall. Do not arch your back. You should feel a gentle stretch in your upper calf. 4. Hold this position for __________ seconds. Repeat __________ times. Complete this exercise __________ times a day.   Soleus stretch, standing This exercise is also called a calf (soleus) stretch. It stretches the muscles in the back of the lower calf. 1. Stand with your hands against a wall. 2. Extend your left / right  leg behind you, and bend your front knee slightly. 3. Keeping your heels on the floor and your toes facing forward, bend your back knee and shift your weight slightly over your back leg. You should feel a gentle stretch deep in your lower calf. 4. Hold this position for __________ seconds. Repeat __________ times. Complete this exercise __________ times a day. Gastroc and soleus stretch, standing step This exercise stretches the muscles in the back of the lower leg. These muscles are in the upper calf (gastrocnemius) and the lower calf (soleus). 1. Stand with the ball of your left / right foot on the front of a step. The ball of your foot is on the walking surface, right under your toes. 2. Keep your other foot firmly on the same step. 3. Hold on to the wall or a railing for balance. 4. Slowly lift your other foot, allowing your body weight to press your heel down over the edge of the front of the step. Keep knee straight and unbent. You should feel a stretch in your calf. 5. Hold this position for __________ seconds. 6. Return both feet to the step. 7. Repeat this exercise with a slight bend in your left / right knee. Repeat __________ times with your left / right knee straight and __________ times with your left / right knee bent. Complete this exercise __________ times a day. Balance exercise This exercise builds your balance and strength control of your arch to help take pressure off your plantar fascia. Single leg stand If this exercise   is too easy, you can try it with your eyes closed or while standing on a pillow. 1. Without shoes, stand near a railing or in a doorway. You may hold on to the railing or door frame as needed. 2. Stand on your left / right foot. Keep your big toe down on the floor and lift the arch of your foot. You should feel a stretch across the bottom of your foot and your arch. Do not let your foot roll inward. 3. Hold this position for __________ seconds. Repeat  __________ times. Complete this exercise __________ times a day. This information is not intended to replace advice given to you by your health care provider. Make sure you discuss any questions you have with your health care provider. Document Revised: 05/15/2020 Document Reviewed: 05/15/2020 [employer] Patient Education  2021 [employer]  

## 2020-10-07 NOTE — Progress Notes (Signed)
Office Visit Note  Patient: Karina Newman             Date of Birth: 08-Jul-1956           MRN: [id number]             PCP: Cassandria Anger, MD Referring: Cassandria Anger, MD Visit Date: 10/21/2020 Occupation: @GUAROCC @  Subjective:  Left knee joint pain  History of Present Illness: Karina Newman is a 65 y.o. female with history of osteoarthritis and DDD.  She continues to have intermittent pain in the left knee joint.  She denies any warmth or swelling.  She denies any falls or injuries. She did not notice much improvement after the cortisone injection on 05/06/20.  She did not receive Visco gel injections due to change of insurance.  She has intermittent pain and stiffness in both hands.  She denies any joint swelling at this time. She was also recently diagnosed with plantar fasciitis in both feet.  She established care with Dr. Cannon Kettle.  She has started using a brace at night and a heel lift daily but she has not noticed much improvement in her symptoms.  She has ongoing pain in the right shoulder joint.  She is planning following up with Dr. Durward Fortes.  She continues to take gabapentin, Flexeril, and oxycodone as prescribed for pain management.  She uses Voltaren gel topically as needed for pain relief.  She also takes Bayer aspirin back and body as needed for pain.     Activities of Daily Living:  Patient reports morning stiffness for 10 minutes.   Patient Reports nocturnal pain.  Difficulty dressing/grooming: Denies Difficulty climbing stairs: Reports Difficulty getting out of chair: Denies Difficulty using hands for taps, buttons, cutlery, and/or writing: Reports  Review of Systems  Constitutional: Positive for fatigue.  HENT: Positive for mouth dryness.   Eyes: Positive for dryness.  Respiratory: Negative for shortness of breath.   Cardiovascular: Negative for swelling in legs/feet.  Gastrointestinal: Negative for constipation.  Endocrine: Positive for cold  intolerance, heat intolerance and excessive thirst.  Genitourinary: Negative for difficulty urinating.  Musculoskeletal: Positive for arthralgias, joint pain, joint swelling, muscle weakness, morning stiffness and muscle tenderness.  Skin: Negative for rash.  Allergic/Immunologic: Negative for susceptible to infections.  Neurological: Positive for weakness.  Hematological: Negative for bruising/bleeding tendency.  Psychiatric/Behavioral: Positive for sleep disturbance.    PMFS History:  Patient Active Problem List   Diagnosis Date Noted  . Toe pain, chronic, right 09/17/2020  . Grief at loss of child 07/24/2020  . Dysphagia 01/02/2020  . Globus sensation 01/02/2020  . Thoracic back pain 12/19/2019  . Sore throat 10/24/2019  . Goiter 10/24/2019  . Cough 10/24/2019  . Neoplasm of uncertain behavior of skin 09/27/2019  . Pain of right breast 06/03/2019  . Fatty liver 01/23/2019  . Hand pain 09/28/2018  . Knee pain 09/28/2018  . Inappropriate sinus tachycardia 05/17/2018  . Apnea 05/17/2018  . Upper respiratory infection 09/23/2017  . Fatigue 09/12/2017  . Well adult exam 01/19/2017  . Abdominal pain 01/19/2017  . Headache 10/27/2016  . Pain in right shoulder 09/16/2016  . Left hip pain 09/16/2016  . Low vitamin B12 level 03/16/2016  . Piriformis syndrome of right side 02/04/2016  . IT band syndrome 02/04/2016  . RUQ abdominal pain 02/04/2016  . Low back pain radiating to right lower extremity 09/19/2015  . Allergic rhinitis 09/19/2015  . Atypical chest pain 04/07/2015  . GERD (gastroesophageal reflux disease) 04/07/2015  .  Axillary adenitis 03/23/2015  . Migraine headache 03/23/2015  . Hives 01/29/2015  . Edema 01/29/2015  . Cervical disc disorder with radiculopathy of cervical region 12/11/2014  . Insomnia 07/31/2014  . Degenerative cervical disc 05/08/2014  . Pain in joint, shoulder region 03/05/2014  . Patellofemoral arthralgia of both knees 03/01/2014  . Bilateral  shoulder pain 10/18/2013  . Sinusitis, bacterial 08/20/2013  . URI, acute 08/14/2013  . Hemoptysis 08/14/2013  . Otitis media of left ear 08/14/2013  . Labral tear of shoulder 07/03/2013  . Hyperthyroidism 07/03/2013  . HTN (hypertension), benign 07/03/2013  . LVH (left ventricular hypertrophy) due to hypertensive disease 07/03/2013  . Vitamin D deficiency 07/03/2013    Past Medical History:  Diagnosis Date  . Abnormal cervical Pap smear with positive HPV DNA test 01/2014,01/2017   2015 Normal cytology with positive high-risk HPV 18/45. Colposcopy negative with negative ECC.  2018 normal cytology with positive high-risk HPV 18/45  . Anemia   . Apnea 05/17/2018  . Autoimmune gastritis   . Fatty liver   . Gastritis   . Heart murmur   . Hypertension   . Hyperthyroidism   . Inappropriate sinus tachycardia 05/17/2018  . Internal hemorrhoids   . Osteoarthritis   . Right shoulder pain 2001   chronic pain  . Thyroid disease    Hyperthyroid. Was on PTU (Dr Hampton Abbot in [place]) - stopped in 2013, stable  . Tubular adenoma of colon     Family History  Problem Relation Age of Onset  . Kidney disease Mother        ESRD  . Hypertension Mother   . Cancer Father        lung ca  . Hypertension Sister   . Kidney disease Sister   . Diabetes Brother   . Hypertension Brother   . Diabetes Paternal Grandmother   . Hypertension Sister   . Hypertension Brother   . Sleep apnea Son   . Hypertension Son   . Colon cancer Neg Hx   . Stomach cancer Neg Hx   . Esophageal cancer Neg Hx   . Rectal cancer Neg Hx   . Pancreatic cancer Neg Hx    Past Surgical History:  Procedure Laterality Date  . BREAST CYST ASPIRATION Left 2012  . CESAREAN SECTION     x 4   . HAND SURGERY Left   . SHOULDER SURGERY Right 2002  . SHOULDER SURGERY Left 2014  . TUBAL LIGATION     Social History   Social History Narrative  . Not on file   Immunization History  Administered Date(s) Administered  . Zoster Recombinat  (Shingrix) 03/25/2017, 06/27/2017     Objective: Vital Signs: BP 126/74 (BP Location: Left Arm, Patient Position: Sitting, Cuff Size: Normal)   Pulse 97   Resp 15   Ht 5\' 4"  (1.626 m)   Wt 172 lb (78 kg)   BMI 29.52 kg/m    Physical Exam Vitals and nursing note reviewed.  Constitutional:      Appearance: She is well-developed and well-nourished.  HENT:     Head: Normocephalic and atraumatic.  Eyes:     Extraocular Movements: EOM normal.     Conjunctiva/sclera: Conjunctivae normal.  Cardiovascular:     Pulses: Intact distal pulses.  Pulmonary:     Effort: Pulmonary effort is normal.  Abdominal:     Palpations: Abdomen is soft.  Musculoskeletal:     Cervical back: Normal range of motion.  Skin:    General: Skin  is warm and dry.     Capillary Refill: Capillary refill takes less than 2 seconds.  Neurological:     Mental Status: She is alert and oriented to person, place, and time.  Psychiatric:        Mood and Affect: Mood and affect normal.        Behavior: Behavior normal.      Musculoskeletal Exam: C-spine, thoracic spine, and lumbar spine have good range of motion with no discomfort.  Right shoulder has pain with internal rotation and abduction.  Left shoulder has full range of motion with no discomfort.  Elbow joints, wrist joints, MCPs, PIPs, DIPs have good range of motion with no synovitis.  DIP thickening consistent with osteoarthritis of both hands noted.  She is able to make a complete fist bilaterally.  Hip joints have good range of motion with some discomfort bilaterally.  Tenderness palpation over bilateral trochanteric bursa.  Left knee has painful and limited extension.  Bilateral knee crepitus noted.  No warmth or effusion of knee joints noted.  Ankle joints have good range of motion with no tenderness or inflammation.   CDAI Exam: CDAI Score: -- Patient Global: --; Provider Global: -- Swollen: --; Tender: -- Joint Exam 10/21/2020   No joint exam has been  documented for this visit   There is currently no information documented on the homunculus. Go to the Rheumatology activity and complete the homunculus joint exam.  Investigation: No additional findings.  Imaging: DG Foot Complete Left  Result Date: 09/29/2020 Please see detailed radiograph report in office note.  DG Foot Complete Right  Result Date: 09/29/2020 Please see detailed radiograph report in office note.   Recent Labs: Lab Results  Component Value Date   WBC 6.2 07/26/2019   HGB 13.8 07/26/2019   PLT 398.0 07/26/2019   NA 137 07/26/2019   K 4.0 07/26/2019   CL 98 07/26/2019   CO2 29 07/26/2019   GLUCOSE 105 (H) 07/26/2019   BUN 14 07/26/2019   CREATININE 0.84 07/26/2019   BILITOT 0.5 07/26/2019   ALKPHOS 77 07/26/2019   AST 18 07/26/2019   ALT 27 07/26/2019   PROT 8.4 (H) 07/26/2019   ALBUMIN 4.5 07/26/2019   CALCIUM 9.7 07/26/2019   GFRAA 78 06/20/2018    Speciality Comments: No specialty comments available.  Procedures:  No procedures performed Allergies: Penicillins, Tramadol, Gabapentin, Iodine, Lyrica [pregabalin], Nsaids, and Voltaren [diclofenac]   Assessment / Plan:     Visit Diagnoses: Primary osteoarthritis of both knees: She has been experiencing intermittent pain and stiffness in both knee joints, left greater than right.  She has not had any recent injuries or falls.  Going to the patient she has been staying home more often due to the severity of pain in her left knee joint at times.  She continues to take oxycodone as needed for pain relief and uses Voltaren gel topically as needed.  She had a cortisone injection in the left knee on 05/06/2020 which provided temporary pain relief.  She has tried home exercises to improve her knee joint pain.  She has painful and limited extension of the left knee joint on exam today.  No warmth or effusion was noted.  No Baker's cyst was palpable.  X-rays of both knees were obtained on 05/06/2020 which were  consistent with moderate osteoarthritis and moderate chondromalacia patella.  We will apply for Visco gel injections for both knee joints.  This patient has knee pain which interferes with functional and  activities of daily living.   This patient has experienced inadequate response, adverse effects and/or intolerance with conservative treatments such as acetaminophen, NSAIDS, topical creams, and home exercises. This patient has experienced inadequate response to intra articular steroid injections for at least 3 months.  This patient is not scheduled to have a total knee replacement within 6 months of starting treatment with viscosupplementation.  Trochanteric bursitis of both hips: She has tenderness ovation over bilateral trochanteric bursa.  She was encouraged to perform stretching exercises on a daily basis  Cervical disc disorder with radiculopathy of cervical region: She has chronic pain and stiffness in her neck.  No symptoms of radiculopathy currently.  She continues to take gabapentin, Flexeril, and oxycodone as prescribed.   Plantar fasciitis, bilateral: She was recently diagnosed with plantar fasciitis of both feet by Dr. Cannon Kettle.  She has been using plantar fascia night splints as well as heel lifts throughout the day but has not noticed any improvement in her symptoms.  She will continue to follow-up with Dr. Cannon Kettle as recommended.   Other insomnia: She takes trazodone 50 mg 1 tablet by mouth at bedtime for insomnia.  Vitamin D deficiency: She continues to take vitamin D 2000 units daily.  Other medical conditions are listed as follows:  Hypertensive left ventricular hypertrophy, without heart failure  HTN (hypertension), benign  History of gastroesophageal reflux (GERD)  Fatty liver  Hyperthyroidism  Tubular adenoma of colon  Orders: No orders of the defined types were placed in this encounter.  No orders of the defined types were placed in this  encounter.     Follow-Up Instructions: Return in about 6 months (around 04/23/2021) for Osteoarthritis, DDD.   Ofilia Neas, PA-C  Note - This record has been created using Dragon software.  Chart creation errors have been sought, but may not always  have been located. Such creation errors do not reflect on  the standard of medical care.

## 2020-10-09 ENCOUNTER — Other Ambulatory Visit: Payer: Self-pay | Admitting: Internal Medicine

## 2020-10-16 ENCOUNTER — Other Ambulatory Visit: Payer: Self-pay | Admitting: Internal Medicine

## 2020-10-21 ENCOUNTER — Encounter: Payer: Self-pay | Admitting: Physician Assistant

## 2020-10-21 ENCOUNTER — Ambulatory Visit: Payer: Medicare Other | Admitting: Physician Assistant

## 2020-10-21 ENCOUNTER — Other Ambulatory Visit: Payer: Self-pay

## 2020-10-21 ENCOUNTER — Telehealth: Payer: Self-pay

## 2020-10-21 VITALS — BP 126/74 | HR 97 | Resp 15 | Ht 64.0 in | Wt 172.0 lb

## 2020-10-21 DIAGNOSIS — M170 Bilateral primary osteoarthritis of knee: Secondary | ICD-10-CM | POA: Diagnosis not present

## 2020-10-21 DIAGNOSIS — G4709 Other insomnia: Secondary | ICD-10-CM | POA: Diagnosis not present

## 2020-10-21 DIAGNOSIS — I119 Hypertensive heart disease without heart failure: Secondary | ICD-10-CM

## 2020-10-21 DIAGNOSIS — I10 Essential (primary) hypertension: Secondary | ICD-10-CM

## 2020-10-21 DIAGNOSIS — K760 Fatty (change of) liver, not elsewhere classified: Secondary | ICD-10-CM | POA: Diagnosis not present

## 2020-10-21 DIAGNOSIS — M7061 Trochanteric bursitis, right hip: Secondary | ICD-10-CM | POA: Diagnosis not present

## 2020-10-21 DIAGNOSIS — E559 Vitamin D deficiency, unspecified: Secondary | ICD-10-CM | POA: Diagnosis not present

## 2020-10-21 DIAGNOSIS — D126 Benign neoplasm of colon, unspecified: Secondary | ICD-10-CM | POA: Diagnosis not present

## 2020-10-21 DIAGNOSIS — Z8719 Personal history of other diseases of the digestive system: Secondary | ICD-10-CM

## 2020-10-21 DIAGNOSIS — E0590 Thyrotoxicosis, unspecified without thyrotoxic crisis or storm: Secondary | ICD-10-CM

## 2020-10-21 DIAGNOSIS — M5010 Cervical disc disorder with radiculopathy, unspecified cervical region: Secondary | ICD-10-CM

## 2020-10-21 DIAGNOSIS — M722 Plantar fascial fibromatosis: Secondary | ICD-10-CM

## 2020-10-21 DIAGNOSIS — M7062 Trochanteric bursitis, left hip: Secondary | ICD-10-CM

## 2020-10-21 NOTE — Patient Instructions (Signed)
Hand Exercises Hand exercises can be helpful for almost anyone. These exercises can strengthen the hands, improve flexibility and movement, and increase blood flow to the hands. These results can make work and daily tasks easier. Hand exercises can be especially helpful for people who have joint pain from arthritis or have nerve damage from overuse (carpal tunnel syndrome). These exercises can also help people who have injured a hand. Exercises Most of these hand exercises are gentle stretching and motion exercises. It is usually safe to do them often throughout the day. Warming up your hands before exercise may help to reduce stiffness. You can do this with gentle massage or by placing your hands in warm water for 10-15 minutes. It is normal to feel some stretching, pulling, tightness, or mild discomfort as you begin new exercises. This will gradually improve. Stop an exercise right away if you feel sudden, severe pain or your pain gets worse. Ask your health care provider which exercises are best for you. Knuckle bend or "claw" fist 1. Stand or sit with your arm, hand, and all five fingers pointed straight up. Make sure to keep your wrist straight during the exercise. 2. Gently bend your fingers down toward your palm until the tips of your fingers are touching the top of your palm. Keep your big knuckle straight and just bend the small knuckles in your fingers. 3. Hold this position for __________ seconds. 4. Straighten (extend) your fingers back to the starting position. Repeat this exercise 5-10 times with each hand. Full finger fist 1. Stand or sit with your arm, hand, and all five fingers pointed straight up. Make sure to keep your wrist straight during the exercise. 2. Gently bend your fingers into your palm until the tips of your fingers are touching the middle of your palm. 3. Hold this position for __________ seconds. 4. Extend your fingers back to the starting position, stretching every  joint fully. Repeat this exercise 5-10 times with each hand. Straight fist 1. Stand or sit with your arm, hand, and all five fingers pointed straight up. Make sure to keep your wrist straight during the exercise. 2. Gently bend your fingers at the big knuckle, where your fingers meet your hand, and the middle knuckle. Keep the knuckle at the tips of your fingers straight and try to touch the bottom of your palm. 3. Hold this position for __________ seconds. 4. Extend your fingers back to the starting position, stretching every joint fully. Repeat this exercise 5-10 times with each hand. Tabletop 1. Stand or sit with your arm, hand, and all five fingers pointed straight up. Make sure to keep your wrist straight during the exercise. 2. Gently bend your fingers at the big knuckle, where your fingers meet your hand, as far down as you can while keeping the small knuckles in your fingers straight. Think of forming a tabletop with your fingers. 3. Hold this position for __________ seconds. 4. Extend your fingers back to the starting position, stretching every joint fully. Repeat this exercise 5-10 times with each hand. Finger spread 1. Place your hand flat on a table with your palm facing down. Make sure your wrist stays straight as you do this exercise. 2. Spread your fingers and thumb apart from each other as far as you can until you feel a gentle stretch. Hold this position for __________ seconds. 3. Bring your fingers and thumb tight together again. Hold this position for __________ seconds. Repeat this exercise 5-10 times with each hand.   Making circles 1. Stand or sit with your arm, hand, and all five fingers pointed straight up. Make sure to keep your wrist straight during the exercise. 2. Make a circle by touching the tip of your thumb to the tip of your index finger. 3. Hold for __________ seconds. Then open your hand wide. 4. Repeat this motion with your thumb and each finger on your  hand. Repeat this exercise 5-10 times with each hand. Thumb motion 1. Sit with your forearm resting on a table and your wrist straight. Your thumb should be facing up toward the ceiling. Keep your fingers relaxed as you move your thumb. 2. Lift your thumb up as high as you can toward the ceiling. Hold for __________ seconds. 3. Bend your thumb across your palm as far as you can, reaching the tip of your thumb for the small finger (pinkie) side of your palm. Hold for __________ seconds. Repeat this exercise 5-10 times with each hand. Grip strengthening 1. Hold a stress ball or other soft ball in the middle of your hand. 2. Slowly increase the pressure, squeezing the ball as much as you can without causing pain. Think of bringing the tips of your fingers into the middle of your palm. All of your finger joints should bend when doing this exercise. 3. Hold your squeeze for __________ seconds, then relax. Repeat this exercise 5-10 times with each hand.   Contact a health care provider if:  Your hand pain or discomfort gets much worse when you do an exercise.  Your hand pain or discomfort does not improve within 2 hours after you exercise. If you have any of these problems, stop doing these exercises right away. Do not do them again unless your health care provider says that you can. Get help right away if:  You develop sudden, severe hand pain or swelling. If this happens, stop doing these exercises right away. Do not do them again unless your health care provider says that you can. This information is not intended to replace advice given to you by your health care provider. Make sure you discuss any questions you have with your health care provider. Document Revised: 11/23/2018 Document Reviewed: 08/03/2018 Elsevier Patient Education  2021 [employer]  

## 2020-10-21 NOTE — Telephone Encounter (Signed)
Please apply for bilateral knee visco, per Taylor Dale, PA-C. Thanks!  

## 2020-10-22 ENCOUNTER — Ambulatory Visit: Payer: Medicare Other | Admitting: Internal Medicine

## 2020-10-24 ENCOUNTER — Telehealth: Payer: Self-pay | Admitting: Sports Medicine

## 2020-10-24 NOTE — Telephone Encounter (Signed)
I dont have any other recommendations. She can contact her insurance company to see if they have other dermatologists that are in network that she might can see sooner Thanks Dr. Cannon Kettle

## 2020-10-24 NOTE — Telephone Encounter (Signed)
Patient called stating the dermatologists that you referred to, will not be able to see patient until July. Has requested another referral, another dermatologists, Please Advise

## 2020-10-24 NOTE — Telephone Encounter (Signed)
Thanks, lvm for patient

## 2020-10-29 NOTE — Telephone Encounter (Signed)
Submitted for VOB  10/29/2020.

## 2020-10-31 ENCOUNTER — Other Ambulatory Visit: Payer: Self-pay | Admitting: Internal Medicine

## 2020-11-04 NOTE — Telephone Encounter (Signed)
Please call to schedule Visco knee injections.  Authorized for Synvisc Bilateral Knees. Buy and Bill. Deductible does not apply. PA is not required. Insurance to cover 100% of allowable cost for injection with a $30.00 copay, and 80% of allowable cost for the medication.

## 2020-11-10 ENCOUNTER — Ambulatory Visit: Payer: Medicare Other | Admitting: Physician Assistant

## 2020-11-10 ENCOUNTER — Other Ambulatory Visit: Payer: Self-pay | Admitting: Internal Medicine

## 2020-11-10 ENCOUNTER — Other Ambulatory Visit: Payer: Self-pay

## 2020-11-10 DIAGNOSIS — M170 Bilateral primary osteoarthritis of knee: Secondary | ICD-10-CM | POA: Diagnosis not present

## 2020-11-10 MED ORDER — LIDOCAINE HCL 1 % IJ SOLN
1.5000 mL | INTRAMUSCULAR | Status: AC | PRN
  Administered 2020-11-10: 1.5 mL

## 2020-11-10 MED ORDER — HYLAN G-F 20 16 MG/2ML IX SOSY
16.0000 mg | PREFILLED_SYRINGE | INTRA_ARTICULAR | Status: AC | PRN
  Administered 2020-11-10: 16 mg via INTRA_ARTICULAR

## 2020-11-10 NOTE — Progress Notes (Signed)
   Procedure Note  Patient: Karina Newman             Date of Birth: 1956-04-23           MRN: [id number]             Visit Date: 11/10/2020  Procedures: Visit Diagnoses:  1. Primary osteoarthritis of both knees    Synvisc #1 bilateral knees, B/B Large Joint Inj: bilateral knee on 11/10/2020 9:13 AM Indications: pain Details: 25 G 1.5 in needle, medial approach  Arthrogram: No  Medications (Right): 1.5 mL lidocaine 1 %; 16 mg Hylan 16 MG/2ML Aspirate (Right): 0 mL Medications (Left): 1.5 mL lidocaine 1 %; 16 mg Hylan 16 MG/2ML Aspirate (Left): 0 mL Outcome: tolerated well, no immediate complications Procedure, treatment alternatives, risks and benefits explained, specific risks discussed. Consent was given by the patient. Immediately prior to procedure a time out was called to verify the correct patient, procedure, equipment, support staff and site/side marked as required. Patient was prepped and draped in the usual sterile fashion.     Patient tolerated the procedure well.  Aftercare was discussed.   Hazel Sams, PA-C

## 2020-11-13 ENCOUNTER — Encounter: Payer: Self-pay | Admitting: Sports Medicine

## 2020-11-13 ENCOUNTER — Other Ambulatory Visit: Payer: Self-pay

## 2020-11-13 ENCOUNTER — Ambulatory Visit: Payer: Medicare Other | Admitting: Sports Medicine

## 2020-11-13 DIAGNOSIS — M2020 Hallux rigidus, unspecified foot: Secondary | ICD-10-CM

## 2020-11-13 DIAGNOSIS — M792 Neuralgia and neuritis, unspecified: Secondary | ICD-10-CM

## 2020-11-13 DIAGNOSIS — M79672 Pain in left foot: Secondary | ICD-10-CM

## 2020-11-13 DIAGNOSIS — M79671 Pain in right foot: Secondary | ICD-10-CM | POA: Diagnosis not present

## 2020-11-13 MED ORDER — METHYLPREDNISOLONE 4 MG PO TBPK: ORAL_TABLET | ORAL | 0 refills | Status: DC

## 2020-11-13 NOTE — Progress Notes (Signed)
Subjective: Karina Newman is a 65 y.o. female patient who presents to office for evaluation of Right> Left bunion pain. Patient complains of progressive pain especially that has not gotten better of sharp shooting pain over the toe joints.  Patient reports that the only shoes she can wear is her [employer] tennis shoes. No other issues noted.  Patient Active Problem List   Diagnosis Date Noted  . Toe pain, chronic, right 09/17/2020  . Grief at loss of child 07/24/2020  . Dysphagia 01/02/2020  . Globus sensation 01/02/2020  . Thoracic back pain 12/19/2019  . Sore throat 10/24/2019  . Goiter 10/24/2019  . Cough 10/24/2019  . Neoplasm of uncertain behavior of skin 09/27/2019  . Pain of right breast 06/03/2019  . Fatty liver 01/23/2019  . Hand pain 09/28/2018  . Knee pain 09/28/2018  . Inappropriate sinus tachycardia 05/17/2018  . Apnea 05/17/2018  . Upper respiratory infection 09/23/2017  . Fatigue 09/12/2017  . Well adult exam 01/19/2017  . Abdominal pain 01/19/2017  . Headache 10/27/2016  . Pain in right shoulder 09/16/2016  . Left hip pain 09/16/2016  . Low vitamin B12 level 03/16/2016  . Piriformis syndrome of right side 02/04/2016  . IT band syndrome 02/04/2016  . RUQ abdominal pain 02/04/2016  . Low back pain radiating to right lower extremity 09/19/2015  . Allergic rhinitis 09/19/2015  . Atypical chest pain 04/07/2015  . GERD (gastroesophageal reflux disease) 04/07/2015  . Axillary adenitis 03/23/2015  . Migraine headache 03/23/2015  . Hives 01/29/2015  . Edema 01/29/2015  . Cervical disc disorder with radiculopathy of cervical region 12/11/2014  . Insomnia 07/31/2014  . Degenerative cervical disc 05/08/2014  . Pain in joint, shoulder region 03/05/2014  . Patellofemoral arthralgia of both knees 03/01/2014  . Bilateral shoulder pain 10/18/2013  . Sinusitis, bacterial 08/20/2013  . URI, acute 08/14/2013  . Hemoptysis 08/14/2013  . Otitis media of left ear 08/14/2013  .  Labral tear of shoulder 07/03/2013  . Hyperthyroidism 07/03/2013  . HTN (hypertension), benign 07/03/2013  . LVH (left ventricular hypertrophy) due to hypertensive disease 07/03/2013  . Vitamin D deficiency 07/03/2013    Current Outpatient Medications on File Prior to Visit  Medication Sig Dispense Refill  . amLODipine (NORVASC) 5 MG tablet TAKE 1 TABLET (5 MG TOTAL) BY MOUTH DAILY. NEED OFFICE VISIT FOR FUTURE REFILL. 90 tablet 2  . Aspirin-Caffeine (BAYER BACK & BODY PO) Take by mouth as needed.    . Cyanocobalamin (VITAMIN B-12) 500 MCG SUBL 1 sl qd 100 tablet 5  . cyclobenzaprine (FLEXERIL) 5 MG tablet TAKE 1 TABLET BY MOUTH THREE TIMES A DAY AS NEEDED FOR MUSCLE SPASMS 60 tablet 2  . Diclofenac Sodium 1.5 % SOLN Use 15 gtt tid externally for pain 150 mL 5  . dicyclomine (BENTYL) 10 MG capsule Take 1 capsule (10 mg total) by mouth every 8 (eight) hours as needed for spasms. 270 capsule 3  . doxazosin (CARDURA) 4 MG tablet TAKE 1 TABLET BY MOUTH EVERY DAY 90 tablet 2  . escitalopram (LEXAPRO) 5 MG tablet TAKE 1 TABLET (5 MG TOTAL) BY MOUTH DAILY. 90 tablet 2  . fluticasone (FLONASE) 50 MCG/ACT nasal spray PLACE 1 SPRAY INTO BOTH NOSTRILS AS NEEDED. 48 mL 3  . gabapentin (NEURONTIN) 100 MG capsule TAKE 1 CAPSULE BY MOUTH THREE TIMES A DAY AS NEEDED 90 capsule 3  . LORazepam (ATIVAN) 0.5 MG tablet Take 1 tablet (0.5 mg total) by mouth 2 (two) times daily as needed for anxiety  or sleep. 60 tablet 1  . omeprazole (PRILOSEC) 40 MG capsule TAKE 1 CAPSULE (40 MG TOTAL) BY MOUTH IN THE MORNING AND AT BEDTIME. 180 capsule 1  . oxyCODONE-acetaminophen (PERCOCET/ROXICET) 5-325 MG tablet Take 1 tablet by mouth 2 (two) times daily as needed for severe pain. 60 tablet 0  . potassium chloride (KLOR-CON) 10 MEQ tablet Take 1 tablet (10 mEq total) by mouth daily. 90 tablet 3  . sucralfate (CARAFATE) 1 g tablet TAKE 1 TABLET BY MOUTH 3 TIMES A DAY BETWEEN MEALS 270 tablet 3  .  telmisartan-hydrochlorothiazide (MICARDIS HCT) 80-25 MG tablet Take 1 tablet by mouth daily. 90 tablet 3  . traZODone (DESYREL) 50 MG tablet TAKE 1 TABLET BY MOUTH EVERYDAY AT BEDTIME 90 tablet 3  . valACYclovir (VALTREX) 500 MG tablet TAKE 1 TABLET (500 MG TOTAL) BY MOUTH 2 (TWO) TIMES DAILY. FOR 5 DAYS WITH OUTBREAK 180 tablet 1  . Vitamin D, Cholecalciferol, 1000 units CAPS Take 2,000 Units by mouth.    . carvedilol (COREG) 12.5 MG tablet Take 1 tablet (12.5 mg total) by mouth 2 (two) times daily. 180 tablet 3   No current facility-administered medications on file prior to visit.    Allergies  Allergen Reactions  . Penicillins Hives  . Tramadol Anaphylaxis    Memory lapses Memory lapses  . Gabapentin     Nausea   . Iodine Other (See Comments)    Due to hyperthyroidism  . Lyrica [Pregabalin] Dermatitis  . Nsaids     gastritis  . Voltaren [Diclofenac] Hives    Objective:  General: Alert and oriented x3 in no acute distress  Dermatology: No open lesions bilateral lower extremities, no webspace macerations, no ecchymosis bilateral, all nails x 10 are well manicured.  Vascular: Dorsalis Pedis and Posterior Tibial pedal pulses 1/4, Capillary Fill Time 3 seconds, (+) pedal hair growth bilateral, no edema bilateral lower extremities, Temperature gradient within normal limits.  Neurology: Gross sensation intact via light touch bilateral, subjective sharp shooting pain on the first MPJs bilateral right greater than left.  Musculoskeletal: Mild tenderness with palpation right>left bunion deformity, there is severely limited first MPJ range of motion patient only gets about 20 degrees dorsiflexion and 0 degrees plantarflexion right is worse than left.  Patient has pes planus foot deformity.  Assessment and Plan: Problem List Items Addressed This Visit   None   Visit Diagnoses    Hallux rigidus, unspecified laterality    -  Primary   Foot pain, bilateral       Neuritis           -Complete examination performed -Discussed treatement options; discussed HAV deformity with arthritis -Dispensed bunion shield for patient to use as instructed -Rx Medrol Dosepak for patient to use as instructed -Recommend continue with good supportive shoes for foot type -Advised patient to monitor nerve pains likely from bunions -Patient to return to office if fails to improve after 5 weeks or sooner if condition worsens.  Landis Martins, DPM

## 2020-11-17 ENCOUNTER — Ambulatory Visit: Payer: Medicare Other | Admitting: Physician Assistant

## 2020-11-17 ENCOUNTER — Other Ambulatory Visit: Payer: Self-pay

## 2020-11-17 DIAGNOSIS — M1711 Unilateral primary osteoarthritis, right knee: Secondary | ICD-10-CM | POA: Diagnosis not present

## 2020-11-17 DIAGNOSIS — M170 Bilateral primary osteoarthritis of knee: Secondary | ICD-10-CM

## 2020-11-17 DIAGNOSIS — M1712 Unilateral primary osteoarthritis, left knee: Secondary | ICD-10-CM | POA: Diagnosis not present

## 2020-11-17 MED ORDER — HYLAN G-F 20 16 MG/2ML IX SOSY
16.0000 mg | PREFILLED_SYRINGE | INTRA_ARTICULAR | Status: AC | PRN
  Administered 2020-11-17: 16 mg via INTRA_ARTICULAR

## 2020-11-17 MED ORDER — LIDOCAINE HCL 1 % IJ SOLN
1.5000 mL | INTRAMUSCULAR | Status: AC | PRN
  Administered 2020-11-17: 1.5 mL

## 2020-11-17 NOTE — Progress Notes (Signed)
   Procedure Note  Patient: Karina Newman             Date of Birth: [birth year]           MRN: [id number]             Visit Date: 11/17/2020  Procedures: Visit Diagnoses:  1. Primary osteoarthritis of both knees      Synvisc #2 bilateral knees, B/B  Large Joint Inj: bilateral knee on 11/17/2020 9:22 AM Indications: pain Details: 25 G 1.5 in needle, medial approach  Arthrogram: No  Medications (Right): 1.5 mL lidocaine 1 %; 16 mg Hylan 16 MG/2ML Aspirate (Right): 0 mL Medications (Left): 1.5 mL lidocaine 1 %; 16 mg Hylan 16 MG/2ML Aspirate (Left): 0 mL Outcome: tolerated well, no immediate complications Procedure, treatment alternatives, risks and benefits explained, specific risks discussed. Consent was given by the patient. Immediately prior to procedure a time out was called to verify the correct patient, procedure, equipment, support staff and site/side marked as required. Patient was prepped and draped in the usual sterile fashion.     Patient tolerated the procedure well.  Aftercare was discussed.  Hazel Sams, PA_C

## 2020-11-19 DIAGNOSIS — H0288B Meibomian gland dysfunction left eye, upper and lower eyelids: Secondary | ICD-10-CM | POA: Diagnosis not present

## 2020-11-19 DIAGNOSIS — H1045 Other chronic allergic conjunctivitis: Secondary | ICD-10-CM | POA: Diagnosis not present

## 2020-11-19 DIAGNOSIS — H0288A Meibomian gland dysfunction right eye, upper and lower eyelids: Secondary | ICD-10-CM | POA: Diagnosis not present

## 2020-11-24 ENCOUNTER — Ambulatory Visit: Payer: Medicare Other | Admitting: Physician Assistant

## 2020-11-24 ENCOUNTER — Other Ambulatory Visit: Payer: Self-pay

## 2020-11-24 DIAGNOSIS — M1711 Unilateral primary osteoarthritis, right knee: Secondary | ICD-10-CM

## 2020-11-24 DIAGNOSIS — M1712 Unilateral primary osteoarthritis, left knee: Secondary | ICD-10-CM | POA: Diagnosis not present

## 2020-11-24 DIAGNOSIS — M170 Bilateral primary osteoarthritis of knee: Secondary | ICD-10-CM

## 2020-11-24 MED ORDER — LIDOCAINE HCL 1 % IJ SOLN
1.5000 mL | INTRAMUSCULAR | Status: AC | PRN
  Administered 2020-11-24: 1.5 mL

## 2020-11-24 MED ORDER — HYLAN G-F 20 16 MG/2ML IX SOSY
16.0000 mg | PREFILLED_SYRINGE | INTRA_ARTICULAR | Status: AC | PRN
  Administered 2020-11-24: 16 mg via INTRA_ARTICULAR

## 2020-11-24 NOTE — Progress Notes (Signed)
   Procedure Note  Patient: Andreah Goheen             Date of Birth: 1955/09/23           MRN: [id number]             Visit Date: 11/24/2020  Procedures: Visit Diagnoses:  1. Primary osteoarthritis of both knees     Synvisc #3 bilateral knees, B/B Large Joint Inj: bilateral knee on 11/24/2020 9:51 AM Indications: pain Details: 25 G 1.5 in needle, medial approach  Arthrogram: No  Medications (Right): 1.5 mL lidocaine 1 %; 16 mg Hylan 16 MG/2ML Aspirate (Right): 0 mL Medications (Left): 1.5 mL lidocaine 1 %; 16 mg Hylan 16 MG/2ML Aspirate (Left): 0 mL Outcome: tolerated well, no immediate complications Procedure, treatment alternatives, risks and benefits explained, specific risks discussed. Consent was given by the patient. Immediately prior to procedure a time out was called to verify the correct patient, procedure, equipment, support staff and site/side marked as required. Patient was prepped and draped in the usual sterile fashion.     Patient tolerated the procedure well.  Aftercare was discussed.  Hazel Sams, PA-C

## 2020-12-04 ENCOUNTER — Ambulatory Visit (INDEPENDENT_AMBULATORY_CARE_PROVIDER_SITE_OTHER): Payer: Medicare Other | Admitting: Internal Medicine

## 2020-12-04 ENCOUNTER — Encounter: Payer: Self-pay | Admitting: Internal Medicine

## 2020-12-04 ENCOUNTER — Other Ambulatory Visit: Payer: Self-pay

## 2020-12-04 ENCOUNTER — Ambulatory Visit (INDEPENDENT_AMBULATORY_CARE_PROVIDER_SITE_OTHER): Payer: Medicare Other

## 2020-12-04 VITALS — BP 122/80 | HR 93 | Temp 98.3°F | Ht 64.0 in | Wt 173.2 lb

## 2020-12-04 DIAGNOSIS — Z634 Disappearance and death of family member: Secondary | ICD-10-CM

## 2020-12-04 DIAGNOSIS — R0602 Shortness of breath: Secondary | ICD-10-CM | POA: Diagnosis not present

## 2020-12-04 DIAGNOSIS — E0590 Thyrotoxicosis, unspecified without thyrotoxic crisis or storm: Secondary | ICD-10-CM | POA: Diagnosis not present

## 2020-12-04 DIAGNOSIS — E049 Nontoxic goiter, unspecified: Secondary | ICD-10-CM

## 2020-12-04 DIAGNOSIS — G8929 Other chronic pain: Secondary | ICD-10-CM | POA: Diagnosis not present

## 2020-12-04 DIAGNOSIS — M25511 Pain in right shoulder: Secondary | ICD-10-CM

## 2020-12-04 DIAGNOSIS — E538 Deficiency of other specified B group vitamins: Secondary | ICD-10-CM

## 2020-12-04 DIAGNOSIS — K635 Polyp of colon: Secondary | ICD-10-CM

## 2020-12-04 DIAGNOSIS — E559 Vitamin D deficiency, unspecified: Secondary | ICD-10-CM

## 2020-12-04 DIAGNOSIS — R059 Cough, unspecified: Secondary | ICD-10-CM

## 2020-12-04 DIAGNOSIS — F4321 Adjustment disorder with depressed mood: Secondary | ICD-10-CM

## 2020-12-04 DIAGNOSIS — R079 Chest pain, unspecified: Secondary | ICD-10-CM | POA: Diagnosis not present

## 2020-12-04 LAB — COMPREHENSIVE METABOLIC PANEL
ALT: 24 U/L (ref 0–35)
AST: 19 U/L (ref 0–37)
Albumin: 4.1 g/dL (ref 3.5–5.2)
Alkaline Phosphatase: 71 U/L (ref 39–117)
BUN: 14 mg/dL (ref 6–23)
CO2: 31 mEq/L (ref 19–32)
Calcium: 9.7 mg/dL (ref 8.4–10.5)
Chloride: 98 mEq/L (ref 96–112)
Creatinine, Ser: 0.8 mg/dL (ref 0.40–1.20)
GFR: 78.2 mL/min (ref >60.00)
Glucose, Bld: 103 mg/dL — ABNORMAL HIGH (ref 70–99)
Potassium: 3.1 mEq/L — ABNORMAL LOW (ref 3.5–5.1)
Sodium: 136 mEq/L (ref 135–145)
Total Bilirubin: 0.6 mg/dL (ref 0.2–1.2)
Total Protein: 7.7 g/dL (ref 6.0–8.3)

## 2020-12-04 LAB — TSH: TSH: 0.44 u[IU]/mL (ref 0.35–4.50)

## 2020-12-04 LAB — LIPID PANEL
Cholesterol: 161 mg/dL (ref 0–200)
HDL: 40.2 mg/dL (ref >39.00)
LDL Cholesterol: 101 mg/dL — ABNORMAL HIGH (ref 0–99)
NonHDL: 120.92
Total CHOL/HDL Ratio: 4
Triglycerides: 101 mg/dL (ref 0.0–149.0)
VLDL: 20.2 mg/dL (ref 0.0–40.0)

## 2020-12-04 LAB — CBC WITH DIFFERENTIAL/PLATELET
Basophils Absolute: 0 10*3/uL (ref 0.0–0.1)
Basophils Relative: 0.7 % (ref 0.0–3.0)
Eosinophils Absolute: 0 10*3/uL (ref 0.0–0.7)
Eosinophils Relative: 0.7 % (ref 0.0–5.0)
HCT: 38.2 % (ref 36.0–46.0)
Hemoglobin: 13 g/dL (ref 12.0–15.0)
Lymphocytes Relative: 29.7 % (ref 12.0–46.0)
Lymphs Abs: 1.5 10*3/uL (ref 0.7–4.0)
MCHC: 33.9 g/dL (ref 30.0–36.0)
MCV: 95.2 fl (ref 78.0–100.0)
Monocytes Absolute: 0.3 10*3/uL (ref 0.1–1.0)
Monocytes Relative: 6.2 % (ref 3.0–12.0)
Neutro Abs: 3.1 10*3/uL (ref 1.4–7.7)
Neutrophils Relative %: 62.7 % (ref 43.0–77.0)
Platelets: 307 10*3/uL (ref 150.0–400.0)
RBC: 4.02 Mil/uL (ref 3.87–5.11)
RDW: 12.5 % (ref 11.5–15.5)
WBC: 5 10*3/uL (ref 4.0–10.5)

## 2020-12-04 LAB — URINALYSIS
Bilirubin Urine: NEGATIVE
Hgb urine dipstick: NEGATIVE
Ketones, ur: NEGATIVE
Leukocytes,Ua: NEGATIVE
Nitrite: NEGATIVE
Specific Gravity, Urine: 1.025 (ref 1.000–1.030)
Total Protein, Urine: NEGATIVE
Urine Glucose: NEGATIVE
Urobilinogen, UA: 0.2 (ref 0.0–1.0)
pH: 5.5 (ref 5.0–8.0)

## 2020-12-04 LAB — VITAMIN D 25 HYDROXY (VIT D DEFICIENCY, FRACTURES): VITD: 76.73 ng/mL (ref 30.00–100.00)

## 2020-12-04 LAB — VITAMIN B12: Vitamin B-12: >1506 pg/mL — ABNORMAL HIGH (ref 211–911)

## 2020-12-04 LAB — T4, FREE: Free T4: 0.72 ng/dL (ref 0.60–1.60)

## 2020-12-04 MED ORDER — VORTIOXETINE HBR 5 MG PO TABS: 5.0000 mg | ORAL_TABLET | Freq: Every day | ORAL | 5 refills | Status: DC

## 2020-12-04 MED ORDER — ASPIRIN EC 81 MG PO TBEC: 81.0000 mg | DELAYED_RELEASE_TABLET | Freq: Every day | ORAL | 3 refills | Status: AC

## 2020-12-04 MED ORDER — OXYCODONE-ACETAMINOPHEN 5-325 MG PO TABS: 1.0000 | ORAL_TABLET | Freq: Two times a day (BID) | ORAL | 0 refills | Status: DC | PRN

## 2020-12-04 NOTE — Assessment & Plan Note (Addendum)
Off Trazodone Lexapro - pt stopped Will try Trintellix

## 2020-12-04 NOTE — Assessment & Plan Note (Signed)
Cont w/Percocet prn - rare use  Potential benefits of a long term steroid  use as well as potential risks  and complications were explained to the patient and were aknowledged.

## 2020-12-04 NOTE — Assessment & Plan Note (Signed)
Check TSH, FT4 

## 2020-12-04 NOTE — Assessment & Plan Note (Signed)
On B12 

## 2020-12-04 NOTE — Assessment & Plan Note (Signed)
On Vit D 2000 iu a day

## 2020-12-04 NOTE — Addendum Note (Signed)
Addended by: Boris Lown B on: 12/04/2020 02:08 PM   Modules accepted: Orders

## 2020-12-04 NOTE — Progress Notes (Signed)
Subjective:  Patient ID: Karina Newman, female    DOB: 03-28-1956  Age: 65 y.o. MRN: [id number]  CC: Follow-up (3 month f/u)   HPI Karina Newman presents for grief, HTN, chronic pain, anxiety   Outpatient Medications Prior to Visit  Medication Sig Dispense Refill  . amLODipine (NORVASC) 5 MG tablet TAKE 1 TABLET (5 MG TOTAL) BY MOUTH DAILY. NEED OFFICE VISIT FOR FUTURE REFILL. 90 tablet 2  . Aspirin-Caffeine (BAYER BACK & BODY PO) Take by mouth as needed.    . Cyanocobalamin (VITAMIN B-12) 500 MCG SUBL 1 sl qd 100 tablet 5  . cyclobenzaprine (FLEXERIL) 5 MG tablet TAKE 1 TABLET BY MOUTH THREE TIMES A DAY AS NEEDED FOR MUSCLE SPASMS 60 tablet 2  . Diclofenac Sodium 1.5 % SOLN Use 15 gtt tid externally for pain 150 mL 5  . dicyclomine (BENTYL) 10 MG capsule Take 1 capsule (10 mg total) by mouth every 8 (eight) hours as needed for spasms. 270 capsule 3  . doxazosin (CARDURA) 4 MG tablet TAKE 1 TABLET BY MOUTH EVERY DAY 90 tablet 2  . fluticasone (FLONASE) 50 MCG/ACT nasal spray PLACE 1 SPRAY INTO BOTH NOSTRILS AS NEEDED. 48 mL 3  . gabapentin (NEURONTIN) 100 MG capsule TAKE 1 CAPSULE BY MOUTH THREE TIMES A DAY AS NEEDED 90 capsule 3  . LORazepam (ATIVAN) 0.5 MG tablet Take 1 tablet (0.5 mg total) by mouth 2 (two) times daily as needed for anxiety or sleep. 60 tablet 1  . omeprazole (PRILOSEC) 40 MG capsule TAKE 1 CAPSULE (40 MG TOTAL) BY MOUTH IN THE MORNING AND AT BEDTIME. 180 capsule 1  . oxyCODONE-acetaminophen (PERCOCET/ROXICET) 5-325 MG tablet Take 1 tablet by mouth 2 (two) times daily as needed for severe pain. 60 tablet 0  . potassium chloride (KLOR-CON) 10 MEQ tablet Take 1 tablet (10 mEq total) by mouth daily. 90 tablet 3  . sucralfate (CARAFATE) 1 g tablet TAKE 1 TABLET BY MOUTH 3 TIMES A DAY BETWEEN MEALS 270 tablet 3  . telmisartan-hydrochlorothiazide (MICARDIS HCT) 80-25 MG tablet Take 1 tablet by mouth daily. 90 tablet 3  . traZODone (DESYREL) 50 MG tablet TAKE 1 TABLET BY  MOUTH EVERYDAY AT BEDTIME 90 tablet 3  . valACYclovir (VALTREX) 500 MG tablet TAKE 1 TABLET (500 MG TOTAL) BY MOUTH 2 (TWO) TIMES DAILY. FOR 5 DAYS WITH OUTBREAK 180 tablet 1  . Vitamin D, Cholecalciferol, 1000 units CAPS Take 2,000 Units by mouth.    . carvedilol (COREG) 12.5 MG tablet Take 1 tablet (12.5 mg total) by mouth 2 (two) times daily. 180 tablet 3  . escitalopram (LEXAPRO) 5 MG tablet TAKE 1 TABLET (5 MG TOTAL) BY MOUTH DAILY. (Patient not taking: Reported on 12/04/2020) 90 tablet 2  . methylPREDNISolone (MEDROL DOSEPAK) 4 MG TBPK tablet Take as directed (Patient not taking: Reported on 12/04/2020) 21 tablet 0   No facility-administered medications prior to visit.    ROS: Review of Systems  Constitutional: Positive for fatigue. Negative for activity change, appetite change, chills and unexpected weight change.  HENT: Negative for congestion, mouth sores and sinus pressure.   Eyes: Negative for visual disturbance.  Respiratory: Negative for cough and chest tightness.   Gastrointestinal: Negative for abdominal pain and nausea.  Genitourinary: Negative for difficulty urinating, frequency and vaginal pain.  Musculoskeletal: Positive for arthralgias, neck pain and neck stiffness. Negative for back pain and gait problem.  Skin: Negative for pallor and rash.  Neurological: Negative for dizziness, tremors, weakness, numbness and headaches.  Psychiatric/Behavioral: Negative for confusion and sleep disturbance. The patient is nervous/anxious.     Objective:  BP 122/80 (BP Location: Left Arm)   Pulse 93   Temp 98.3 F (36.8 C) (Oral)   Ht 5\' 4"  (1.626 m)   Wt 173 lb 3.2 oz (78.6 kg)   SpO2 98%   BMI 29.73 kg/m   BP Readings from Last 3 Encounters:  12/04/20 122/80  10/21/20 126/74  09/17/20 (!) 142/82    Wt Readings from Last 3 Encounters:  12/04/20 173 lb 3.2 oz (78.6 kg)  10/21/20 172 lb (78 kg)  09/17/20 171 lb (77.6 kg)    Physical Exam Constitutional:      General:  She is not in acute distress.    Appearance: She is well-developed.  HENT:     Head: Normocephalic.     Right Ear: External ear normal.     Left Ear: External ear normal.     Nose: Nose normal.  Eyes:     General:        Right eye: No discharge.        Left eye: No discharge.     Conjunctiva/sclera: Conjunctivae normal.     Pupils: Pupils are equal, round, and reactive to light.  Neck:     Thyroid: No thyromegaly.     Vascular: No JVD.     Trachea: No tracheal deviation.  Cardiovascular:     Rate and Rhythm: Normal rate and regular rhythm.     Heart sounds: Normal heart sounds.  Pulmonary:     Effort: No respiratory distress.     Breath sounds: No stridor. No wheezing.  Abdominal:     General: Bowel sounds are normal. There is no distension.     Palpations: Abdomen is soft. There is no mass.     Tenderness: There is no abdominal tenderness. There is no guarding or rebound.  Musculoskeletal:        General: Tenderness present.     Cervical back: Normal range of motion and neck supple.  Lymphadenopathy:     Cervical: No cervical adenopathy.  Skin:    Findings: No erythema or rash.  Neurological:     Mental Status: She is oriented to person, place, and time.     Cranial Nerves: No cranial nerve deficit.     Motor: No abnormal muscle tone.     Coordination: Coordination normal.     Deep Tendon Reflexes: Reflexes normal.  Psychiatric:        Behavior: Behavior normal.        Thought Content: Thought content normal.        Judgment: Judgment normal.   neck, shoulder w/pain  Lab Results  Component Value Date   WBC 6.2 07/26/2019   HGB 13.8 07/26/2019   HCT 40.6 07/26/2019   PLT 398.0 07/26/2019   GLUCOSE 105 (H) 07/26/2019   CHOL 188 07/26/2019   TRIG 120.0 07/26/2019   HDL 40.90 07/26/2019   LDLCALC 123 (H) 07/26/2019   ALT 27 07/26/2019   AST 18 07/26/2019   NA 137 07/26/2019   K 4.0 07/26/2019   CL 98 07/26/2019   CREATININE 0.84 07/26/2019   BUN 14  07/26/2019   CO2 29 07/26/2019   TSH 0.39 07/26/2019    US THYROID  Result Date: 10/31/2019 CLINICAL DATA:  Goiter EXAM: THYROID ULTRASOUND TECHNIQUE: Ultrasound examination of the thyroid gland and adjacent soft tissues was performed. COMPARISON:  None. FINDINGS: Parenchymal Echotexture: Mildly heterogenous Isthmus: 0.5 cm  Right lobe: 5.3 x 1.5 x 2.2 cm Left lobe: 5.2 x 1.5 x 2.4 cm _________________________________________________________ Estimated total number of nodules >/= 1 cm: 2 Number of spongiform nodules >/=  2 cm not described below (TR1): 0 Number of mixed cystic and solid nodules >/= 1.5 cm not described below ([place]): 0 _________________________________________________________ Nodule # 1: Location: Right; Inferior Maximum size: 1.1 cm; Other 2 dimensions: 1.1 x 0.7 cm Composition: solid/almost completely solid (2) Echogenicity: isoechoic (1) Shape: not taller-than-wide (0) Margins: ill-defined (0) Echogenic foci: none (0) ACR TI-RADS total points: 3. ACR TI-RADS risk category: TR3 (3 points). ACR TI-RADS recommendations: Given size (<1.4 cm) and appearance, this nodule does NOT meet TI-RADS criteria for biopsy or dedicated follow-up. _________________________________________________________ Nodule # 2: Location: Left; Inferior Maximum size: 1.2 cm; Other 2 dimensions: 0.9 x 0.6 cm Composition: solid/almost completely solid (2) 6 Echogenicity: hypoechoic (2) Shape: not taller-than-wide (0) Margins: ill-defined (0) Echogenic foci: none (0) ACR TI-RADS total points: 4. ACR TI-RADS risk category: TR4 (4-6 points). ACR TI-RADS recommendations: *Given size (>/= 1 - 1.4 cm) and appearance, a follow-up ultrasound in 1 year should be considered based on TI-RADS criteria. _________________________________________________________ There are few prominent but subcentimeter cervical lymph nodes. IMPRESSION: 1. Mildly enlarged, mildly heterogeneous thyroid gland as detailed above. 2. There is a 1.2 cm TR 4  thyroid nodule in the left inferior thyroid gland. A 1 year follow-up ultrasound is recommended for this thyroid nodule. 3. There is a 1.1 cm TR 3 thyroid nodule in the right inferior thyroid gland. No further follow-up is required for this thyroid nodule. The above is in keeping with the ACR TI-RADS recommendations - J Am Coll Radiol 2017;14:587-595. Electronically Signed   By: Constance Holster M.D.   On: 10/31/2019 19:26    Assessment & Plan:    Follow-up: No follow-ups on file.  Walker Kehr, MD

## 2020-12-17 ENCOUNTER — Ambulatory Visit: Payer: Medicare Other | Admitting: Internal Medicine

## 2021-01-05 DIAGNOSIS — H2513 Age-related nuclear cataract, bilateral: Secondary | ICD-10-CM | POA: Diagnosis not present

## 2021-01-05 DIAGNOSIS — H16223 Keratoconjunctivitis sicca, not specified as Sjogren's, bilateral: Secondary | ICD-10-CM | POA: Diagnosis not present

## 2021-01-05 DIAGNOSIS — H1045 Other chronic allergic conjunctivitis: Secondary | ICD-10-CM | POA: Diagnosis not present

## 2021-01-08 ENCOUNTER — Other Ambulatory Visit: Payer: Self-pay | Admitting: Internal Medicine

## 2021-01-19 ENCOUNTER — Telehealth: Payer: Self-pay | Admitting: Internal Medicine

## 2021-01-19 NOTE — Telephone Encounter (Signed)
   Patient requesting prior auth and refill  For oxyCODONE-acetaminophen (PERCOCET/ROXICET) 5-325 MG tablet   Pharmacy [id number] - [place], [place]

## 2021-01-22 ENCOUNTER — Ambulatory Visit (INDEPENDENT_AMBULATORY_CARE_PROVIDER_SITE_OTHER): Admitting: Orthopaedic Surgery

## 2021-01-22 ENCOUNTER — Other Ambulatory Visit: Payer: Self-pay

## 2021-01-22 ENCOUNTER — Ambulatory Visit: Payer: Self-pay

## 2021-01-22 ENCOUNTER — Encounter: Payer: Self-pay | Admitting: Orthopaedic Surgery

## 2021-01-22 ENCOUNTER — Telehealth: Payer: Self-pay | Admitting: Radiology

## 2021-01-22 DIAGNOSIS — G8929 Other chronic pain: Secondary | ICD-10-CM

## 2021-01-22 DIAGNOSIS — M25511 Pain in right shoulder: Secondary | ICD-10-CM | POA: Diagnosis not present

## 2021-01-22 NOTE — Telephone Encounter (Signed)
PA Key: [id number]  Prior Auth for oxyCODONE-acetaminophen (PERCOCET/ROXICET) 5-325 MG tablet   Pending decision.

## 2021-01-22 NOTE — Progress Notes (Signed)
Office Visit Note   Patient: Karina Newman           Date of Birth: 1955-12-26           MRN: [id number] Visit Date: 01/22/2021              Requested by: Cassandria Anger, MD [place],  [place] [id number] PCP: Plotnikov, Evie Lacks, MD   Assessment & Plan: Visit Diagnoses:  1. Chronic right shoulder pain     Plan Mrs. 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  Her past history is outlined in some detail from our office note 2020.  I had scheduled an MRI scan but apparently it was never performed.  She did have an MRI scan in 2015 revealing some old possible trauma along the inferior aspect of the glenoid with ectopic calcification but no other obvious abnormalities.  She did have a prior arthroscopic SCD and distal clavicle resection from her Workmen's Comp. injury in [place] several years after the injury.  She presently is on gabapentin and oxycodone with Tylenol and uses Voltaren gel.  She had an exacerbation of her pain about a year ago with even "worsening" starting in December 2021 without recurrent injury or trauma.  She is having difficulty raising her arm over her head and difficulty using the arm for any activities.  She is right-hand dominant.  We will repeat the order for the MRI scan and plan to see her back shortly thereafter.  She still has an open Workmen's Comp. claim  Follow-Up Instructions: Return After MRI scan right shoulder.   Orders:  Orders Placed This Encounter  Procedures   XR Shoulder Right   MR SHOULDER RIGHT WO CONTRAST   No orders of the defined types were placed in this encounter.     Procedures: No procedures performed   Clinical Data: No additional findings.   Subjective: Chief Complaint  Patient presents with   Right Shoulder - Follow-up    Pain is worsening, she is having pain now down under her arm.   See above for  detail.  Also seen in 2020 with a detailed office note regarding her Workmen's Comp. injury to the right shoulder in 2001 with persistent pain.  She now lives in [place] and has having considerable pain to the point of compromise.  No numbness or tingling.  Pain is localized to the shoulder  HPI  Review of Systems   Objective: Vital Signs: There were no vitals taken for this visit.  Physical Exam Constitutional:      Appearance: She is well-developed.  Eyes:     Pupils: Pupils are equal, round, and reactive to light.  Pulmonary:     Effort: Pulmonary effort is normal.  Skin:    General: Skin is warm and dry.  Neurological:     Mental Status: She is alert and oriented to person, place, and time.  Psychiatric:        Behavior: Behavior normal.    Ortho Exam awake alert and oriented x3.  Comfortable sitting.  Had several areas of tenderness along the posterior and anterior aspect of her shoulder.  Skin intact.  Neurologically intact.  Good strength.  Biceps intact.  Positive impingement and positive empty can testing with positive Speed sign.  Has a little loss of overhead flexion and slight loss of internal rotation.  No  grinding or crepitation  Specialty Comments:  No specialty comments available.  Imaging: XR Shoulder Right  Result Date: 01/22/2021 Was of the right shoulder obtained in 3 projections.  There is a little ectopic calcification at the very inferior aspect of the glenoid but no evidence of glenohumeral arthritis.  The joint spaces well-preserved and the humeral head is centered about the glenoid.  Normal space between the humeral head and the acromion.  There is some degenerative change at the [facility] joint with narrowing.  No acute changes    PMFS History: Patient Active Problem List   Diagnosis Date Noted   Toe pain, chronic, right 09/17/2020   Grief at loss of child 07/24/2020   Dysphagia 01/02/2020   Globus sensation 01/02/2020   Thoracic back pain 12/19/2019    Sore throat 10/24/2019   Goiter 10/24/2019   Cough 10/24/2019   Neoplasm of uncertain behavior of skin 09/27/2019   Pain of right breast 06/03/2019   Fatty liver 01/23/2019   Hand pain 09/28/2018   Knee pain 09/28/2018   Inappropriate sinus tachycardia 05/17/2018   Apnea 05/17/2018   Upper respiratory infection 09/23/2017   Fatigue 09/12/2017   Well adult exam 01/19/2017   Abdominal pain 01/19/2017   Headache 10/27/2016   Pain in right shoulder 09/16/2016   Left hip pain 09/16/2016   Low vitamin B12 level 03/16/2016   Piriformis syndrome of right side 02/04/2016   IT band syndrome 02/04/2016   RUQ abdominal pain 02/04/2016   Low back pain radiating to right lower extremity 09/19/2015   Allergic rhinitis 09/19/2015   Atypical chest pain 04/07/2015   GERD (gastroesophageal reflux disease) 04/07/2015   Axillary adenitis 03/23/2015   Migraine headache 03/23/2015   Hives 01/29/2015   Edema 01/29/2015   Cervical disc disorder with radiculopathy of cervical region 12/11/2014   Insomnia 07/31/2014   Degenerative cervical disc 05/08/2014   Pain in joint, shoulder region 03/05/2014   Patellofemoral arthralgia of both knees 03/01/2014   Bilateral shoulder pain 10/18/2013   Sinusitis, bacterial 08/20/2013   URI, acute 08/14/2013   Hemoptysis 08/14/2013   Otitis media of left ear 08/14/2013   Labral tear of shoulder 07/03/2013   Hyperthyroidism 07/03/2013   HTN (hypertension), benign 07/03/2013   LVH (left ventricular hypertrophy) due to hypertensive disease 07/03/2013   Vitamin D deficiency 07/03/2013   Past Medical History:  Diagnosis Date   Abnormal cervical Pap smear with positive HPV DNA test 01/2014,01/2017   2015 Normal cytology with positive high-risk HPV 18/45. Colposcopy negative with negative ECC.  2018 normal cytology with positive high-risk HPV 18/45   Anemia    Apnea 05/17/2018   Autoimmune gastritis    Fatty liver    Gastritis    Heart murmur    Hypertension     Hyperthyroidism    Inappropriate sinus tachycardia 05/17/2018   Internal hemorrhoids    Osteoarthritis    Right shoulder pain 2001   chronic pain   Thyroid disease    Hyperthyroid. Was on PTU (Dr Hampton Abbot in [place]) - stopped in 2013, stable   Tubular adenoma of colon     Family History  Problem Relation Age of Onset   Kidney disease Mother        ESRD   Hypertension Mother    Cancer Father        lung ca   Hypertension Sister    Kidney disease Sister    Diabetes Brother    Hypertension Brother    Diabetes Paternal Grandmother  Hypertension Sister    Hypertension Brother    Sleep apnea Son    Hypertension Son    Colon cancer Neg Hx    Stomach cancer Neg Hx    Esophageal cancer Neg Hx    Rectal cancer Neg Hx    Pancreatic cancer Neg Hx     Past Surgical History:  Procedure Laterality Date   BREAST CYST ASPIRATION Left 2012   CESAREAN SECTION     x 4    HAND SURGERY Left    SHOULDER SURGERY Right 2002   SHOULDER SURGERY Left 2014   TUBAL LIGATION     Social History   Occupational History   Occupation: RETIRED  Tobacco Use   Smoking status: Never   Smokeless tobacco: Never  Vaping Use   Vaping Use: Never used  Substance and Sexual Activity   Alcohol use: Yes    Comment: RARELY   Drug use: No   Sexual activity: Yes    Birth control/protection: Post-menopausal, Surgical    Comment: BTL-1st intercourse 16 yo-5 partners     Garald Balding, MD   Note - This record has been created using [employer].  Chart creation errors have been sought, but may not always  have been located. Such creation errors do not reflect on  the standard of medical care.

## 2021-01-22 NOTE — Telephone Encounter (Signed)
WC Right shoulder, can you please get approval for MRI w/o contrast?

## 2021-01-23 NOTE — Telephone Encounter (Signed)
PA Key: [id number]   Prior Auth for oxyCODONE-acetaminophen (PERCOCET/ROXICET) 5-325 MG tablet  Proir Auth was approved through 02/21/21

## 2021-02-10 ENCOUNTER — Other Ambulatory Visit: Payer: Self-pay | Admitting: Internal Medicine

## 2021-02-10 DIAGNOSIS — Z1231 Encounter for screening mammogram for malignant neoplasm of breast: Secondary | ICD-10-CM

## 2021-03-05 ENCOUNTER — Ambulatory Visit: Payer: Medicare Other | Admitting: Internal Medicine

## 2021-03-18 ENCOUNTER — Other Ambulatory Visit: Payer: Self-pay | Admitting: Internal Medicine

## 2021-03-18 ENCOUNTER — Other Ambulatory Visit: Payer: Self-pay

## 2021-03-18 ENCOUNTER — Encounter: Payer: Self-pay | Admitting: Internal Medicine

## 2021-03-18 ENCOUNTER — Ambulatory Visit (INDEPENDENT_AMBULATORY_CARE_PROVIDER_SITE_OTHER): Payer: Medicare Other | Admitting: Internal Medicine

## 2021-03-18 DIAGNOSIS — M5030 Other cervical disc degeneration, unspecified cervical region: Secondary | ICD-10-CM | POA: Diagnosis not present

## 2021-03-18 DIAGNOSIS — E559 Vitamin D deficiency, unspecified: Secondary | ICD-10-CM

## 2021-03-18 DIAGNOSIS — Z634 Disappearance and death of family member: Secondary | ICD-10-CM | POA: Diagnosis not present

## 2021-03-18 DIAGNOSIS — M5010 Cervical disc disorder with radiculopathy, unspecified cervical region: Secondary | ICD-10-CM

## 2021-03-18 DIAGNOSIS — F4321 Adjustment disorder with depressed mood: Secondary | ICD-10-CM | POA: Diagnosis not present

## 2021-03-18 DIAGNOSIS — S43431S Superior glenoid labrum lesion of right shoulder, sequela: Secondary | ICD-10-CM | POA: Diagnosis not present

## 2021-03-18 DIAGNOSIS — E538 Deficiency of other specified B group vitamins: Secondary | ICD-10-CM

## 2021-03-18 DIAGNOSIS — I10 Essential (primary) hypertension: Secondary | ICD-10-CM

## 2021-03-18 MED ORDER — DICLOFENAC SODIUM 1.5 % EX SOLN: CUTANEOUS | 5 refills | Status: DC

## 2021-03-18 MED ORDER — FLUTICASONE PROPIONATE 50 MCG/ACT NA SUSP: 1.0000 | NASAL | 3 refills | Status: DC | PRN

## 2021-03-18 MED ORDER — OXYCODONE-ACETAMINOPHEN 5-325 MG PO TABS: 1.0000 | ORAL_TABLET | Freq: Two times a day (BID) | ORAL | 0 refills | Status: DC | PRN

## 2021-03-18 NOTE — Assessment & Plan Note (Addendum)
On Vit D.  Continue with current therapy

## 2021-03-18 NOTE — Assessment & Plan Note (Addendum)
Karina Newman 64 yo son died last year in [place]  - acute MI.  She has been grieving.  His son was born on 02/17/21 which is definitely a positive event. Lorazepam as needed.  Potential benefits of a long term benzodiazepines  use as well as potential risks  and complications were explained to the patient and were aknowledged.

## 2021-03-18 NOTE — Assessment & Plan Note (Addendum)
Chronic problem.  Reasonably well-controlled on current therapy.  Percocet - prn -- rare  Potential benefits of a long term opioids use as well as potential risks (i.e. addiction risk, apnea etc) and complications (i.e. Somnolence, constipation and others) were explained to the patient and were aknowledged.

## 2021-03-18 NOTE — Progress Notes (Addendum)
Subjective:  Patient ID: Karina Newman, female    DOB: 06-30-1956  Age: 65 y.o. MRN: [id number]  CC: Follow-up (3 month f/u)   HPI Karina Newman presents for floaters, chronic shoulder pain pain, neck pain, chronic anxiety aggravated by grief. C/o stress with the family. Follow-up on mild hypertension  Outpatient Medications Prior to Visit  Medication Sig Dispense Refill   amLODipine (NORVASC) 5 MG tablet TAKE 1 TABLET (5 MG TOTAL) BY MOUTH DAILY. NEED OFFICE VISIT FOR FUTURE REFILL. 90 tablet 2   aspirin EC 81 MG tablet Take 1 tablet (81 mg total) by mouth daily. 100 tablet 3   Cyanocobalamin (VITAMIN B-12) 500 MCG SUBL 1 sl qd 100 tablet 5   cyclobenzaprine (FLEXERIL) 5 MG tablet TAKE 1 TABLET BY MOUTH THREE TIMES A DAY AS NEEDED FOR MUSCLE SPASMS 60 tablet 2   Diclofenac Sodium 1.5 % SOLN USE 15 DROPS THREE TIMES DAILY EXTERNALLY FOR PAIN 150 mL 5   dicyclomine (BENTYL) 10 MG capsule Take 1 capsule (10 mg total) by mouth every 8 (eight) hours as needed for spasms. 270 capsule 3   doxazosin (CARDURA) 4 MG tablet TAKE 1 TABLET BY MOUTH EVERY DAY 90 tablet 2   fluticasone (FLONASE) 50 MCG/ACT nasal spray PLACE 1 SPRAY INTO BOTH NOSTRILS AS NEEDED. 48 mL 3   gabapentin (NEURONTIN) 100 MG capsule TAKE 1 CAPSULE BY MOUTH THREE TIMES A DAY AS NEEDED 90 capsule 3   LORazepam (ATIVAN) 0.5 MG tablet Take 1 tablet (0.5 mg total) by mouth 2 (two) times daily as needed for anxiety or sleep. 60 tablet 1   omeprazole (PRILOSEC) 40 MG capsule TAKE 1 CAPSULE (40 MG TOTAL) BY MOUTH IN THE MORNING AND AT BEDTIME. 180 capsule 1   oxyCODONE-acetaminophen (PERCOCET/ROXICET) 5-325 MG tablet Take 1 tablet by mouth 2 (two) times daily as needed for severe pain. 60 tablet 0   potassium chloride (KLOR-CON) 10 MEQ tablet Take 1 tablet (10 mEq total) by mouth daily. 90 tablet 3   sucralfate (CARAFATE) 1 g tablet TAKE 1 TABLET BY MOUTH 3 TIMES A DAY BETWEEN MEALS 270 tablet 3   telmisartan-hydrochlorothiazide  (MICARDIS HCT) 80-25 MG tablet Take 1 tablet by mouth daily. 90 tablet 3   traZODone (DESYREL) 50 MG tablet TAKE 1 TABLET BY MOUTH EVERYDAY AT BEDTIME 90 tablet 3   valACYclovir (VALTREX) 500 MG tablet TAKE 1 TABLET (500 MG TOTAL) BY MOUTH 2 (TWO) TIMES DAILY. FOR 5 DAYS WITH OUTBREAK 180 tablet 1   Vitamin D, Cholecalciferol, 1000 units CAPS Take 2,000 Units by mouth.     vortioxetine HBr (TRINTELLIX) 5 MG TABS tablet Take 1 tablet (5 mg total) by mouth daily. 30 tablet 5   carvedilol (COREG) 12.5 MG tablet Take 1 tablet (12.5 mg total) by mouth 2 (two) times daily. 180 tablet 3   No facility-administered medications prior to visit.    ROS: Review of Systems  Constitutional:  Negative for activity change, appetite change, chills, fatigue and unexpected weight change.  HENT:  Negative for congestion, mouth sores and sinus pressure.   Eyes:  Negative for visual disturbance.  Respiratory:  Negative for cough and chest tightness.   Gastrointestinal:  Negative for abdominal pain and nausea.  Genitourinary:  Negative for difficulty urinating, frequency and vaginal pain.  Musculoskeletal:  Positive for arthralgias, neck pain and neck stiffness. Negative for back pain and gait problem.  Skin:  Negative for pallor and rash.  Neurological:  Negative for dizziness, tremors, weakness, numbness  and headaches.  Psychiatric/Behavioral:  Negative for confusion and sleep disturbance.    Objective:  BP 122/78 (BP Location: Left Arm)   Pulse 81   Temp 98.4 F (36.9 C) (Oral)   Ht '5\' 4"'$  (1.626 m)   Wt 166 lb 6.4 oz (75.5 kg)   SpO2 96%   BMI 28.56 kg/m   BP Readings from Last 3 Encounters:  03/18/21 122/78  12/04/20 122/80  10/21/20 126/74    Wt Readings from Last 3 Encounters:  03/18/21 166 lb 6.4 oz (75.5 kg)  12/04/20 173 lb 3.2 oz (78.6 kg)  10/21/20 172 lb (78 kg)    Physical Exam Constitutional:      General: She is not in acute distress.    Appearance: Normal appearance. She is  well-developed.  HENT:     Head: Normocephalic.     Right Ear: External ear normal.     Left Ear: External ear normal.     Nose: Nose normal.  Eyes:     General:        Right eye: No discharge.        Left eye: No discharge.     Conjunctiva/sclera: Conjunctivae normal.     Pupils: Pupils are equal, round, and reactive to light.  Neck:     Thyroid: No thyromegaly.     Vascular: No JVD.     Trachea: No tracheal deviation.  Cardiovascular:     Rate and Rhythm: Normal rate and regular rhythm.     Heart sounds: Normal heart sounds.  Pulmonary:     Effort: No respiratory distress.     Breath sounds: No stridor. No wheezing.  Abdominal:     General: Bowel sounds are normal. There is no distension.     Palpations: Abdomen is soft. There is no mass.     Tenderness: There is no abdominal tenderness. There is no guarding or rebound.  Musculoskeletal:        General: Tenderness present.     Cervical back: Normal range of motion and neck supple. No rigidity.  Lymphadenopathy:     Cervical: No cervical adenopathy.  Skin:    Findings: No erythema or rash.  Neurological:     Cranial Nerves: No cranial nerve deficit.     Motor: Weakness present. No abnormal muscle tone.     Coordination: Coordination normal.     Gait: Gait normal.     Deep Tendon Reflexes: Reflexes normal.  Psychiatric:        Behavior: Behavior normal.        Thought Content: Thought content normal.        Judgment: Judgment normal.   Neck and shoulders - painful, stiff traps  Lab Results  Component Value Date   WBC 5.0 12/04/2020   HGB 13.0 12/04/2020   HCT 38.2 12/04/2020   PLT 307.0 12/04/2020   GLUCOSE 103 (H) 12/04/2020   CHOL 161 12/04/2020   TRIG 101.0 12/04/2020   HDL 40.20 12/04/2020   LDLCALC 101 (H) 12/04/2020   ALT 24 12/04/2020   AST 19 12/04/2020   NA 136 12/04/2020   K 3.1 (L) 12/04/2020   CL 98 12/04/2020   CREATININE 0.80 12/04/2020   BUN 14 12/04/2020   CO2 31 12/04/2020   TSH 0.44  12/04/2020    US THYROID  Result Date: 10/31/2019 CLINICAL DATA:  Goiter EXAM: THYROID ULTRASOUND TECHNIQUE: Ultrasound examination of the thyroid gland and adjacent soft tissues was performed. COMPARISON:  None. FINDINGS: Parenchymal Echotexture: Mildly  heterogenous Isthmus: 0.5 cm Right lobe: 5.3 x 1.5 x 2.2 cm Left lobe: 5.2 x 1.5 x 2.4 cm _________________________________________________________ Estimated total number of nodules >/= 1 cm: 2 Number of spongiform nodules >/=  2 cm not described below (TR1): 0 Number of mixed cystic and solid nodules >/= 1.5 cm not described below ([place]): 0 _________________________________________________________ Nodule # 1: Location: Right; Inferior Maximum size: 1.1 cm; Other 2 dimensions: 1.1 x 0.7 cm Composition: solid/almost completely solid (2) Echogenicity: isoechoic (1) Shape: not taller-than-wide (0) Margins: ill-defined (0) Echogenic foci: none (0) ACR TI-RADS total points: 3. ACR TI-RADS risk category: TR3 (3 points). ACR TI-RADS recommendations: Given size (<1.4 cm) and appearance, this nodule does NOT meet TI-RADS criteria for biopsy or dedicated follow-up. _________________________________________________________ Nodule # 2: Location: Left; Inferior Maximum size: 1.2 cm; Other 2 dimensions: 0.9 x 0.6 cm Composition: solid/almost completely solid (2) 6 Echogenicity: hypoechoic (2) Shape: not taller-than-wide (0) Margins: ill-defined (0) Echogenic foci: none (0) ACR TI-RADS total points: 4. ACR TI-RADS risk category: TR4 (4-6 points). ACR TI-RADS recommendations: *Given size (>/= 1 - 1.4 cm) and appearance, a follow-up ultrasound in 1 year should be considered based on TI-RADS criteria. _________________________________________________________ There are few prominent but subcentimeter cervical lymph nodes. IMPRESSION: 1. Mildly enlarged, mildly heterogeneous thyroid gland as detailed above. 2. There is a 1.2 cm TR 4 thyroid nodule in the left inferior thyroid  gland. A 1 year follow-up ultrasound is recommended for this thyroid nodule. 3. There is a 1.1 cm TR 3 thyroid nodule in the right inferior thyroid gland. No further follow-up is required for this thyroid nodule. The above is in keeping with the ACR TI-RADS recommendations - J Am Coll Radiol 2017;14:587-595. Electronically Signed   By: Constance Holster M.D.   On: 10/31/2019 19:26    Assessment & Plan:     Follow-up: No follow-ups on file.  Walker Kehr, MD

## 2021-03-18 NOTE — Assessment & Plan Note (Addendum)
It is a chronic problem.  It is reasonably well controlled on current therapy with Percocet - prn -- rare  Potential benefits of a long term opioids use as well as potential risks (i.e. addiction risk, apnea etc) and complications (i.e. Somnolence, constipation and others) were explained to the patient and were aknowledged.

## 2021-03-18 NOTE — Assessment & Plan Note (Signed)
Cont w/Vit B12 

## 2021-03-19 DIAGNOSIS — H3561 Retinal hemorrhage, right eye: Secondary | ICD-10-CM | POA: Diagnosis not present

## 2021-03-19 DIAGNOSIS — H2513 Age-related nuclear cataract, bilateral: Secondary | ICD-10-CM | POA: Diagnosis not present

## 2021-03-19 DIAGNOSIS — H16223 Keratoconjunctivitis sicca, not specified as Sjogren's, bilateral: Secondary | ICD-10-CM | POA: Diagnosis not present

## 2021-03-19 DIAGNOSIS — H1045 Other chronic allergic conjunctivitis: Secondary | ICD-10-CM | POA: Diagnosis not present

## 2021-03-24 ENCOUNTER — Telehealth: Payer: Self-pay | Admitting: Orthopaedic Surgery

## 2021-03-24 ENCOUNTER — Ambulatory Visit
Admission: RE | Admit: 2021-03-24 | Discharge: 2021-03-24 | Disposition: A | Discharge status: Home / Self Care | Source: Ambulatory Visit | Attending: Orthopaedic Surgery | Admitting: Orthopaedic Surgery

## 2021-03-24 ENCOUNTER — Other Ambulatory Visit: Payer: Self-pay

## 2021-03-24 DIAGNOSIS — G8929 Other chronic pain: Secondary | ICD-10-CM

## 2021-03-24 DIAGNOSIS — M25511 Pain in right shoulder: Secondary | ICD-10-CM

## 2021-03-24 NOTE — Telephone Encounter (Signed)
Called pt 1X and left vm to call and set MRI review appt with dr. Durward Fortes after 8/9.

## 2021-03-26 ENCOUNTER — Other Ambulatory Visit: Payer: Self-pay

## 2021-03-26 ENCOUNTER — Encounter: Payer: Self-pay | Admitting: Orthopaedic Surgery

## 2021-03-26 ENCOUNTER — Ambulatory Visit (INDEPENDENT_AMBULATORY_CARE_PROVIDER_SITE_OTHER): Admitting: Orthopaedic Surgery

## 2021-03-26 VITALS — Ht 64.0 in | Wt 166.0 lb

## 2021-03-26 DIAGNOSIS — M5010 Cervical disc disorder with radiculopathy, unspecified cervical region: Secondary | ICD-10-CM | POA: Diagnosis not present

## 2021-03-26 DIAGNOSIS — M25511 Pain in right shoulder: Secondary | ICD-10-CM

## 2021-03-26 DIAGNOSIS — G8929 Other chronic pain: Secondary | ICD-10-CM | POA: Diagnosis not present

## 2021-03-26 MED ORDER — METHYLPREDNISOLONE ACETATE 40 MG/ML IJ SUSP
80.0000 mg | INTRAMUSCULAR | Status: AC | PRN
  Administered 2021-03-26: 80 mg via INTRA_ARTICULAR

## 2021-03-26 MED ORDER — LIDOCAINE HCL 2 % IJ SOLN
2.0000 mL | INTRAMUSCULAR | Status: AC | PRN
  Administered 2021-03-26: 2 mL

## 2021-03-26 MED ORDER — BUPIVACAINE HCL 0.25 % IJ SOLN
2.0000 mL | INTRAMUSCULAR | Status: AC | PRN
  Administered 2021-03-26: 2 mL via INTRA_ARTICULAR

## 2021-03-26 NOTE — Progress Notes (Signed)
Office Visit Note   Patient: Karina Newman           Date of Birth: 29-Dec-1955           MRN: [id number] Visit Date: 03/26/2021              Requested by: Cassandria Anger, MD [place],  [place] [id number] PCP: Plotnikov, Evie Lacks, MD   Assessment & Plan: Visit Diagnoses:  1. Cervical disc disorder with radiculopathy of cervical region   2. Chronic right shoulder pain     Plan: Karina Newman had an MRI scan of her right shoulder demonstrating a small low-grade partial width bursal sided tear of the posterior inferior infraspinatus tendon and mild distal supra and infraspinatus tendinosis.  No retracted tear or muscle atrophy.  Mild subacromial and subdeltoid bursitis and mild glenohumeral osteoarthritis.  There was mild AC joint arthropathy as well.  There is a positive impingement syndrome.  Karina Newman relates that Karina Newman has had an ongoing Workmen's Comp. issue with both her cervical spine and shoulder since an injury years ago working for the [employer] was while residing in [place].  He has had a prior arthroscopic procedure to that shoulder and does have a home exercise program with tubing.  I discussed the MRI scan with her and thought that it might be worth repeating the subacromial cortisone injection and monitoring her response.  Karina Newman agreed.  Karina Newman does have a separate issue with her cervical spine.  Karina Newman does have's some discomfort with flexion extension referred to the posterior aspect of her neck and even to the scapular region.  Karina Newman does have some pain in her right arm even at rest it appears to be chronic and related to her neck.  Karina Newman has increasing pain when Karina Newman tries to do overhead activity with her right shoulder. Will inject the subacromial space with Depo-Medrol, Marcaine and Xylocaine and reevaluate in 1 month.  Follow-Up Instructions: Return in about 1 month (around 04/26/2021).   Orders:  No orders of the defined types were placed in this  encounter.  No orders of the defined types were placed in this encounter.     Procedures: Large Joint Inj: R subacromial bursa on 03/26/2021 4:09 PM Indications: pain and diagnostic evaluation Details: 25 G 1.5 in needle, anterolateral approach  Arthrogram: No  Medications: 2 mL lidocaine 2 %; 80 mg methylPREDNISolone acetate 40 MG/ML; 2 mL bupivacaine 0.25 % Consent was given by the patient. Immediately prior to procedure a time out was called to verify the correct patient, procedure, equipment, support staff and site/side marked as required. Patient was prepped and draped in the usual sterile fashion.      Clinical Data: No additional findings.   Subjective: Chief Complaint  Patient presents with   Right Shoulder - Pain, Follow-up    MRI review  Patient presents today for follow up on her right shoulder. Karina Newman had an MRI and is here today for those results.  No change in symptoms  HPI  Review of Systems   Objective: Vital Signs: Ht '5\' 4"'$  (1.626 m)   Wt 166 lb (75.3 kg)   BMI 28.49 kg/m   Physical Exam Constitutional:      Appearance: Karina Newman is well-developed.  Eyes:     Pupils: Pupils are equal, round, and reactive to light.  Pulmonary:     Effort: Pulmonary effort is normal.  Skin:    General: Skin is warm and dry.  Neurological:  Mental Status: Karina Newman is alert and oriented to person, place, and time.  Psychiatric:        Behavior: Behavior normal.    Ortho Exam awake alert and oriented x3.  Comfortable sitting.  I examined the cervical spine and Karina Newman was able to just about touch her chin to her chest but had pain in her neck and even into the right scapula.  Karina Newman did have some limitation of neck extension of only about 60 degrees with similar pain in the posterior aspect of her neck and scapula.  Karina Newman had a good grip and release.  Karina Newman had some pain with overhead motion of her right shoulder but I could fully place it overhead.  Positive impingement and empty can  testing.  No crepitation.  Biceps appears to be intact.  Specialty Comments:  No specialty comments available.  Imaging: No results found.   PMFS History: Patient Active Problem List   Diagnosis Date Noted   Toe pain, chronic, right 09/17/2020   Grief at loss of child 07/24/2020   Dysphagia 01/02/2020   Globus sensation 01/02/2020   Thoracic back pain 12/19/2019   Sore throat 10/24/2019   Goiter 10/24/2019   Cough 10/24/2019   Neoplasm of uncertain behavior of skin 09/27/2019   Pain of right breast 06/03/2019   Fatty liver 01/23/2019   Hand pain 09/28/2018   Knee pain 09/28/2018   Inappropriate sinus tachycardia 05/17/2018   Apnea 05/17/2018   Upper respiratory infection 09/23/2017   Fatigue 09/12/2017   Well adult exam 01/19/2017   Abdominal pain 01/19/2017   Headache 10/27/2016   Pain in right shoulder 09/16/2016   Left hip pain 09/16/2016   Low vitamin B12 level 03/16/2016   Piriformis syndrome of right side 02/04/2016   IT band syndrome 02/04/2016   RUQ abdominal pain 02/04/2016   Low back pain radiating to right lower extremity 09/19/2015   Allergic rhinitis 09/19/2015   Atypical chest pain 04/07/2015   GERD (gastroesophageal reflux disease) 04/07/2015   Axillary adenitis 03/23/2015   Migraine headache 03/23/2015   Hives 01/29/2015   Edema 01/29/2015   Cervical disc disorder with radiculopathy of cervical region 12/11/2014   Insomnia 07/31/2014   Degenerative cervical disc 05/08/2014   Pain in joint, shoulder region 03/05/2014   Patellofemoral arthralgia of both knees 03/01/2014   Bilateral shoulder pain 10/18/2013   Sinusitis, bacterial 08/20/2013   URI, acute 08/14/2013   Hemoptysis 08/14/2013   Otitis media of left ear 08/14/2013   Labral tear of shoulder 07/03/2013   Hyperthyroidism 07/03/2013   HTN (hypertension), benign 07/03/2013   LVH (left ventricular hypertrophy) due to hypertensive disease 07/03/2013   Vitamin D deficiency 07/03/2013    Past Medical History:  Diagnosis Date   Abnormal cervical Pap smear with positive HPV DNA test 01/2014,01/2017   2015 Normal cytology with positive high-risk HPV 18/45. Colposcopy negative with negative ECC.  2018 normal cytology with positive high-risk HPV 18/45   Anemia    Apnea 05/17/2018   Autoimmune gastritis    Fatty liver    Gastritis    Heart murmur    Hypertension    Hyperthyroidism    Inappropriate sinus tachycardia 05/17/2018   Internal hemorrhoids    Osteoarthritis    Right shoulder pain 2001   chronic pain   Thyroid disease    Hyperthyroid. Was on PTU (Dr Hampton Abbot in [place]) - stopped in 2013, stable   Tubular adenoma of colon     Family History  Problem Relation Age  of Onset   Kidney disease Mother        ESRD   Hypertension Mother    Cancer Father        lung ca   Hypertension Sister    Kidney disease Sister    Diabetes Brother    Hypertension Brother    Diabetes Paternal Grandmother    Hypertension Sister    Hypertension Brother    Sleep apnea Son    Hypertension Son    Colon cancer Neg Hx    Stomach cancer Neg Hx    Esophageal cancer Neg Hx    Rectal cancer Neg Hx    Pancreatic cancer Neg Hx     Past Surgical History:  Procedure Laterality Date   BREAST CYST ASPIRATION Left 2012   CESAREAN SECTION     x 4    HAND SURGERY Left    SHOULDER SURGERY Right 2002   SHOULDER SURGERY Left 2014   TUBAL LIGATION     Social History   Occupational History   Occupation: RETIRED  Tobacco Use   Smoking status: Never   Smokeless tobacco: Never  Vaping Use   Vaping Use: Never used  Substance and Sexual Activity   Alcohol use: Yes    Comment: RARELY   Drug use: No   Sexual activity: Yes    Birth control/protection: Post-menopausal, Surgical    Comment: BTL-1st intercourse 16 yo-5 partners

## 2021-03-29 NOTE — Assessment & Plan Note (Signed)
Controlled.  Continue with amlodipine and Micardis HCT

## 2021-03-29 NOTE — Assessment & Plan Note (Signed)
Use Voltaren gel Try TENS unit.  It is a chronic problem.  It is reasonably well controlled on current therapy with Percocet - prn -- rare  Potential benefits of a long term opioids use as well as potential risks (i.e. addiction risk, apnea etc) and complications (i.e. Somnolence, constipation and others) were explained to the patient and were aknowledged.

## 2021-04-03 ENCOUNTER — Other Ambulatory Visit: Payer: Self-pay

## 2021-04-03 ENCOUNTER — Ambulatory Visit
Admission: RE | Admit: 2021-04-03 | Discharge: 2021-04-03 | Disposition: A | Discharge status: Home / Self Care | Payer: Medicare Other | Source: Ambulatory Visit | Attending: Internal Medicine | Admitting: Internal Medicine

## 2021-04-03 DIAGNOSIS — Z1231 Encounter for screening mammogram for malignant neoplasm of breast: Secondary | ICD-10-CM

## 2021-04-04 ENCOUNTER — Other Ambulatory Visit: Payer: Self-pay | Admitting: Cardiovascular Disease

## 2021-04-04 ENCOUNTER — Other Ambulatory Visit: Payer: Self-pay | Admitting: Internal Medicine

## 2021-04-06 NOTE — Telephone Encounter (Signed)
Please call pt to schedule appointment with Dr. Oval Linsey or APP for refills. Last seen in 2020. Thank you!

## 2021-04-06 NOTE — Telephone Encounter (Signed)
Rx(s) sent to pharmacy electronically.  

## 2021-04-08 NOTE — Progress Notes (Signed)
Office Visit Note  Patient: Karina Newman             Date of Birth: 1956/05/28           MRN: [id number]             PCP: Cassandria Anger, MD Referring: Cassandria Anger, MD Visit Date: 04/22/2021 Occupation: '@GUAROCC'$ @  Subjective:  Pain in multiple joints.   History of Present Illness: Karina Newman is a 65 y.o. female with a history of osteoarthritis and degenerative disc disease.  She states in July she started seeing some floaters.  She was seen by an ophthalmologist she was given some samples of the eyedrops.  She does not have the diagnosis.  She states she was also told that she has cataracts.  She continues to have some neck discomfort.  She was having right shoulder joint discomfort for which she had MRI of her right shoulder joint by Dr. Durward Fortes.  She had partial tendon rupture.  Dr. Durward Fortes injected right subacromial bursa.  She has not noticed any improvement in her shoulder.  She continues to have some discomfort in her trochanteric area, bilateral knee joints and left plantar fascia.  She had Visco supplement injections to bilateral knee joints in April 2022.  She states it lasted until recently and now the pain has come back.  She would like to have repeat Visco supplement injections.  Activities of Daily Living:  Patient reports morning stiffness for a few minutes.   Patient Denies nocturnal pain.  Difficulty dressing/grooming: Denies Difficulty climbing stairs: Reports Difficulty getting out of chair: Denies Difficulty using hands for taps, buttons, cutlery, and/or writing: Denies  Review of Systems  Constitutional:  Positive for fatigue.  HENT:  Positive for mouth dryness and nose dryness. Negative for mouth sores.   Eyes:  Positive for pain, visual disturbance and dryness. Negative for itching.  Respiratory:  Negative for difficulty breathing.   Cardiovascular:  Negative for chest pain and palpitations.  Gastrointestinal:  Positive for constipation.  Negative for blood in stool and diarrhea.  Endocrine: Negative for increased urination.  Genitourinary:  Negative for difficulty urinating.  Musculoskeletal:  Positive for joint pain, joint pain, myalgias, morning stiffness and myalgias. Negative for joint swelling and muscle tenderness.  Skin:  Negative for color change, rash and redness.  Allergic/Immunologic: Negative for susceptible to infections.  Neurological:  Positive for light-headedness and headaches. Negative for dizziness, numbness, memory loss and weakness.  Hematological:  Negative for bruising/bleeding tendency.  Psychiatric/Behavioral:  Negative for confusion.    PMFS History:  Patient Active Problem List   Diagnosis Date Noted   Toe pain, chronic, right 09/17/2020   Grief at loss of child 07/24/2020   Dysphagia 01/02/2020   Globus sensation 01/02/2020   Thoracic back pain 12/19/2019   Sore throat 10/24/2019   Goiter 10/24/2019   Cough 10/24/2019   Neoplasm of uncertain behavior of skin 09/27/2019   Pain of right breast 06/03/2019   Fatty liver 01/23/2019   Hand pain 09/28/2018   Knee pain 09/28/2018   Inappropriate sinus tachycardia 05/17/2018   Apnea 05/17/2018   Upper respiratory infection 09/23/2017   Fatigue 09/12/2017   Well adult exam 01/19/2017   Abdominal pain 01/19/2017   Headache 10/27/2016   Pain in right shoulder 09/16/2016   Left hip pain 09/16/2016   Low vitamin B12 level 03/16/2016   Piriformis syndrome of right side 02/04/2016   IT band syndrome 02/04/2016   RUQ abdominal pain 02/04/2016  Low back pain radiating to right lower extremity 09/19/2015   Allergic rhinitis 09/19/2015   Atypical chest pain 04/07/2015   GERD (gastroesophageal reflux disease) 04/07/2015   Axillary adenitis 03/23/2015   Migraine headache 03/23/2015   Hives 01/29/2015   Edema 01/29/2015   Cervical disc disorder with radiculopathy of cervical region 12/11/2014   Insomnia 07/31/2014   Degenerative cervical disc  05/08/2014   Pain in joint, shoulder region 03/05/2014   Patellofemoral arthralgia of both knees 03/01/2014   Bilateral shoulder pain 10/18/2013   Sinusitis, bacterial 08/20/2013   URI, acute 08/14/2013   Hemoptysis 08/14/2013   Otitis media of left ear 08/14/2013   Labral tear of shoulder 07/03/2013   Hyperthyroidism 07/03/2013   HTN (hypertension), benign 07/03/2013   LVH (left ventricular hypertrophy) due to hypertensive disease 07/03/2013   Vitamin D deficiency 07/03/2013    Past Medical History:  Diagnosis Date   Abnormal cervical Pap smear with positive HPV DNA test 01/2014,01/2017   2015 Normal cytology with positive high-risk HPV 18/45. Colposcopy negative with negative ECC.  2018 normal cytology with positive high-risk HPV 18/45   Anemia    Apnea 05/17/2018   Autoimmune gastritis    Fatty liver    Gastritis    Heart murmur    Hypertension    Hyperthyroidism    Inappropriate sinus tachycardia 05/17/2018   Internal hemorrhoids    Osteoarthritis    Right shoulder pain 2001   chronic pain   Thyroid disease    Hyperthyroid. Was on PTU (Dr Hampton Abbot in [place]) - stopped in 2013, stable   Tubular adenoma of colon     Family History  Problem Relation Age of Onset   Kidney disease Mother        ESRD   Hypertension Mother    Lung cancer Father    Hypertension Sister    Kidney disease Sister    Hypertension Sister    Diabetes Brother    Hypertension Brother    Hypertension Brother    Diabetes Paternal Grandmother    Sleep apnea Son    Hypertension Son    Colon cancer Neg Hx    Stomach cancer Neg Hx    Esophageal cancer Neg Hx    Rectal cancer Neg Hx    Pancreatic cancer Neg Hx    Past Surgical History:  Procedure Laterality Date   BREAST CYST ASPIRATION Left 2012   CESAREAN SECTION     x 4    HAND SURGERY Left    SHOULDER SURGERY Right 2002   SHOULDER SURGERY Left 2014   TUBAL LIGATION     Social History   Social History Narrative   Not on file   Immunization  History  Administered Date(s) Administered   Zoster Recombinat (Shingrix) 03/25/2017, 06/27/2017     Objective: Vital Signs: BP 115/75 (BP Location: Left Arm, Patient Position: Sitting, Cuff Size: Normal)   Pulse 85   Ht '5\' 3"'$  (1.6 m)   Wt 167 lb 9.6 oz (76 kg)   BMI 29.69 kg/m    Physical Exam Vitals and nursing note reviewed.  Constitutional:      Appearance: She is well-developed.  HENT:     Head: Normocephalic and atraumatic.  Eyes:     Conjunctiva/sclera: Conjunctivae normal.  Cardiovascular:     Rate and Rhythm: Normal rate and regular rhythm.     Heart sounds: Normal heart sounds.  Pulmonary:     Effort: Pulmonary effort is normal.     Breath sounds:  Normal breath sounds.  Abdominal:     General: Bowel sounds are normal.     Palpations: Abdomen is soft.  Musculoskeletal:     Cervical back: Normal range of motion.  Lymphadenopathy:     Cervical: No cervical adenopathy.  Skin:    General: Skin is warm and dry.     Capillary Refill: Capillary refill takes less than 2 seconds.  Neurological:     Mental Status: She is alert and oriented to person, place, and time.  Psychiatric:        Behavior: Behavior normal.     Musculoskeletal Exam: She had discomfort range of motion of the cervical spine.  Right shoulder joint abduction was limited to 90 degrees.  Left shoulder joint was in full range of motion.  Elbow joints and wrist joints with good range of motion.  She had bilateral DIP and PIP thickening consistent with osteoarthritis.  Hip joints and knee joints in good range of motion.  No warmth swelling or effusion was noted.  There was no tenderness over ankles or MTPs.  There was no tenderness or plantar fascial.  CDAI Exam: CDAI Score: -- Patient Global: --; Provider Global: -- Swollen: --; Tender: -- Joint Exam 04/22/2021   No joint exam has been documented for this visit   There is currently no information documented on the homunculus. Go to the Rheumatology  activity and complete the homunculus joint exam.  Investigation: No additional findings.  Imaging: MR SHOULDER RIGHT WO CONTRAST  Result Date: 03/25/2021 CLINICAL DATA:  Shoulder pain, chronic, osteoarthritis suspected EXAM: MRI OF THE RIGHT SHOULDER WITHOUT CONTRAST TECHNIQUE: Multiplanar, multisequence MR imaging of the shoulder was performed. No intravenous contrast was administered. COMPARISON:  Shoulder radiograph 01/22/2021 FINDINGS: Rotator cuff: There is very mild distal supraspinatus infraspinatus tendinosis. Small, low-grade partial width bursal sided tear of the posterior/inferior infraspinatus tendon at the footprint (sagittal T2 image 19). Teres minor tendon is intact. Subscapularis tendon is intact. Muscles: No muscle atrophy or edema. No intramuscular fluid collection or hematoma. Biceps Long Head: Intraarticular and extraarticular portions of the biceps tendon are intact. Acromioclavicular Joint: Mild arthropathy of the acromioclavicular joint. Minimal subacromial/subdeltoid bursal fluid. Glenohumeral Joint: No joint effusion. No chondral defect. Labrum: Grossly intact, but evaluation is limited by lack of intraarticular fluid/contrast. Bones: No fracture or dislocation. No aggressive osseous lesion. Other: No fluid collection or hematoma. IMPRESSION: Small, low-grade partial width bursal sided tear of the posterior/inferior spinous tendon. Mild distal supraspinatus and infraspinatus tendinosis. No retracted cuff tear. No muscle atrophy. Mild subacromial-subdeltoid bursitis. Mild glenohumeral osteoarthritis.  Mild AC joint arthropathy. Electronically Signed   By: Maurine Simmering   On: 03/25/2021 08:38   MM 3D SCREEN BREAST BILATERAL  Result Date: 04/07/2021 CLINICAL DATA:  Screening. EXAM: DIGITAL SCREENING BILATERAL MAMMOGRAM WITH TOMOSYNTHESIS AND CAD TECHNIQUE: Bilateral screening digital craniocaudal and mediolateral oblique mammograms were obtained. Bilateral screening digital breast  tomosynthesis was performed. The images were evaluated with computer-aided detection. COMPARISON:  Previous exam(s). ACR Breast Density Category c: The breast tissue is heterogeneously dense, which may obscure small masses. FINDINGS: There are no findings suspicious for malignancy. IMPRESSION: No mammographic evidence of malignancy. A result letter of this screening mammogram will be mailed directly to the patient. RECOMMENDATION: Screening mammogram in one year. (Code:SM-B-01Y) BI-RADS CATEGORY  1: Negative. Electronically Signed   By: Audie Pinto M.D.   On: 04/07/2021 13:04    Recent Labs: Lab Results  Component Value Date   WBC 5.0 12/04/2020  HGB 13.0 12/04/2020   PLT 307.0 12/04/2020   NA 136 12/04/2020   K 3.1 (L) 12/04/2020   CL 98 12/04/2020   CO2 31 12/04/2020   GLUCOSE 103 (H) 12/04/2020   BUN 14 12/04/2020   CREATININE 0.80 12/04/2020   BILITOT 0.6 12/04/2020   ALKPHOS 71 12/04/2020   AST 19 12/04/2020   ALT 24 12/04/2020   PROT 7.7 12/04/2020   ALBUMIN 4.1 12/04/2020   CALCIUM 9.7 12/04/2020   GFRAA 78 06/20/2018    Speciality Comments: No specialty comments available.  Procedures:  No procedures performed Allergies: Penicillins, Tramadol, Gabapentin, Iodine, Lexapro [escitalopram], Lyrica [pregabalin], Nsaids, and Voltaren [diclofenac]   Assessment / Plan:     Visit Diagnoses: Primary osteoarthritis of both knees-she had good response to Visco supplement injections which she received in April 2022.  She states the pain has come back and she would like to have repeat Visco injections.  We will schedule them in October.  Lower extremity muscle strengthening exercises were given.  Right shoulder pain-she has limited abduction of the right shoulder joint.  I reviewed her MRI results and also records from Dr. Rudene Anda visit.  Patient was evaluated by Dr. Durward Fortes and had a subacromial cortisone injection.  She continues to have discomfort and limited range of  motion.  She will follow-up with Dr. Durward Fortes.  Trochanteric bursitis of both hips-she continues to have some tenderness in the trochanteric bursa.  IT band stretches were demonstrated.  Plantar fasciitis, bilateral - diagnosed with plantar fasciitis of both feet by Dr. Cannon Kettle.  DDD (degenerative disc disease), cervical-C-spine exercises were given.  She has a stiffness and discomfort in her neck related to work-related injury in the past.  Vitreous floaters of right eye-patient states that she was diagnosed with floaters in her eyes and July.  I requested patient to forward her ophthalmology records to [place].  Vitamin D deficiency-she is on vitamin D 1000 units daily.  With medical problems are listed as follows:  HTN (hypertension), benign  Hypertensive left ventricular hypertrophy, without heart failure  Fatty liver  Other insomnia - trazodone 50 mg 1 tablet by mouth at bedtime for insomnia.  Tubular adenoma of colon  Hyperthyroidism  History of gastroesophageal reflux (GERD)  Orders: No orders of the defined types were placed in this encounter.  No orders of the defined types were placed in this encounter.    Follow-Up Instructions: Return in about 6 months (around 10/20/2021) for Osteoarthritis.   Bo Merino, MD  Note - This record has been created using Editor, commissioning.  Chart creation errors have been sought, but may not always  have been located. Such creation errors do not reflect on  the standard of medical care.

## 2021-04-10 ENCOUNTER — Encounter: Payer: Self-pay | Admitting: Obstetrics and Gynecology

## 2021-04-10 ENCOUNTER — Ambulatory Visit (INDEPENDENT_AMBULATORY_CARE_PROVIDER_SITE_OTHER): Payer: Medicare Other | Admitting: Obstetrics and Gynecology

## 2021-04-10 ENCOUNTER — Other Ambulatory Visit: Payer: Self-pay

## 2021-04-10 ENCOUNTER — Other Ambulatory Visit (HOSPITAL_COMMUNITY)
Admission: RE | Admit: 2021-04-10 | Discharge: 2021-04-10 | Disposition: A | Discharge status: Home / Self Care | Payer: Medicare Other | Source: Ambulatory Visit | Attending: Obstetrics and Gynecology | Admitting: Obstetrics and Gynecology

## 2021-04-10 VITALS — BP 126/78 | HR 98 | Ht 64.5 in | Wt 166.0 lb

## 2021-04-10 DIAGNOSIS — Z9189 Other specified personal risk factors, not elsewhere classified: Secondary | ICD-10-CM

## 2021-04-10 DIAGNOSIS — N950 Postmenopausal bleeding: Secondary | ICD-10-CM

## 2021-04-10 DIAGNOSIS — N393 Stress incontinence (female) (male): Secondary | ICD-10-CM

## 2021-04-10 DIAGNOSIS — N941 Unspecified dyspareunia: Secondary | ICD-10-CM

## 2021-04-10 DIAGNOSIS — Z01419 Encounter for gynecological examination (general) (routine) without abnormal findings: Secondary | ICD-10-CM | POA: Insufficient documentation

## 2021-04-10 DIAGNOSIS — Z124 Encounter for screening for malignant neoplasm of cervix: Secondary | ICD-10-CM

## 2021-04-10 DIAGNOSIS — A6000 Herpesviral infection of urogenital system, unspecified: Secondary | ICD-10-CM | POA: Diagnosis not present

## 2021-04-10 DIAGNOSIS — R3911 Hesitancy of micturition: Secondary | ICD-10-CM

## 2021-04-10 DIAGNOSIS — N930 Postcoital and contact bleeding: Secondary | ICD-10-CM

## 2021-04-10 DIAGNOSIS — Z1151 Encounter for screening for human papillomavirus (HPV): Secondary | ICD-10-CM | POA: Insufficient documentation

## 2021-04-10 LAB — URINALYSIS, COMPLETE W/RFL CULTURE
Bacteria, UA: NONE SEEN /HPF
Bilirubin Urine: NEGATIVE
Casts: NONE SEEN /LPF
Crystals: NONE SEEN /HPF
Glucose, UA: NEGATIVE
Hgb urine dipstick: NEGATIVE
Hyaline Cast: NONE SEEN /LPF
Ketones, ur: NEGATIVE
Leukocyte Esterase: NEGATIVE
Nitrites, Initial: NEGATIVE
Protein, ur: NEGATIVE
RBC / HPF: NONE SEEN /HPF (ref 0–2)
Specific Gravity, Urine: 1.02 (ref 1.001–1.035)
WBC, UA: NONE SEEN /HPF (ref 0–5)
Yeast: NONE SEEN /HPF
pH: 6 (ref 5.0–8.0)

## 2021-04-10 LAB — NO CULTURE INDICATED

## 2021-04-10 MED ORDER — VALACYCLOVIR HCL 500 MG PO TABS: ORAL_TABLET | ORAL | 1 refills | Status: DC

## 2021-04-10 NOTE — Progress Notes (Signed)
65 y.o. [id number] Married Black or [place] American Not Hispanic or Latino female here for annual exam.  Patient has bleeding with sex. Patient has pain on the left side sometimes when she urinates. She is also having some incontinence .   She has had burning and bleeding with intercourse for years. She may be tearing. She will spot for several days after intercourse. She will have some spotting if she strains to have a BM.   She has some hesitancy during her urine stream, will start and stop. She can be uncomfortable in her SP region. Ultimately feels she empties. Flow goes from normal to slow. No vaginal bulge.  Some mild GSI, 2-3 x a week. Tolerable. No leaking on the way to the bathroom.   She has issues with constipation. BM's go from pellets to narrow. She has an appointment with a GI.   H/O HSV, outbreak a couple of times a year.     No LMP recorded. Patient is postmenopausal.          Sexually active: Yes.    The current method of family planning is post menopausal status.    Exercising: No.  The patient does not participate in regular exercise at present. Smoker:  no  Health Maintenance: Pap:  09/26/19 neg Hr HPV +;  01/21/17  Neg + HPV 18/45 History of abnormal Pap:  yes MMG:  04/07/21 Bi-rads 1 neg  BMD:   none  Colonoscopy: 05/06/16 f/u 5 years, has appointment to see GI  TDaP:  unsure  Gardasil: n/a   reports that she has never smoked. She has never used smokeless tobacco. She reports current alcohol use. She reports that she does not use drugs. Rare ETOH. She is retired from the post Therapist, art. She had 4 children, 6 grandchildren. 3 daughters, son died.  She moved here from [place] 8 years ago. Her 88 year old son died last year of a massive MI, his fiance had a baby after he died.   Past Medical History:  Diagnosis Date   Abnormal cervical Pap smear with positive HPV DNA test 01/2014,01/2017   2015 Normal cytology with positive high-risk HPV 18/45. Colposcopy  negative with negative ECC.  2018 normal cytology with positive high-risk HPV 18/45   Anemia    Apnea 05/17/2018   Autoimmune gastritis    Fatty liver    Gastritis    Heart murmur    Hypertension    Hyperthyroidism    Inappropriate sinus tachycardia 05/17/2018   Internal hemorrhoids    Osteoarthritis    Right shoulder pain 2001   chronic pain   Thyroid disease    Hyperthyroid. Was on PTU (Dr Hampton Abbot in [place]) - stopped in 2013, stable   Tubular adenoma of colon     Past Surgical History:  Procedure Laterality Date   BREAST CYST ASPIRATION Left 2012   CESAREAN SECTION     x 4    HAND SURGERY Left    SHOULDER SURGERY Right 2002   SHOULDER SURGERY Left 2014   TUBAL LIGATION      Current Outpatient Medications  Medication Sig Dispense Refill   amLODipine (NORVASC) 5 MG tablet Take 1 tablet (5 mg total) by mouth daily. 30 tablet 0   aspirin EC 81 MG tablet Take 1 tablet (81 mg total) by mouth daily. 100 tablet 3   Cyanocobalamin (VITAMIN B-12) 500 MCG SUBL 1 sl qd 100 tablet 5   cyclobenzaprine (FLEXERIL) 5 MG tablet TAKE 1  TABLET BY MOUTH THREE TIMES A DAY AS NEEDED FOR MUSCLE SPASMS 60 tablet 2   Diclofenac Sodium 1.5 % SOLN USE 15 DROPS THREE TIMES DAILY EXTERNALLY FOR PAIN 150 mL 5   dicyclomine (BENTYL) 10 MG capsule Take 1 capsule (10 mg total) by mouth every 8 (eight) hours as needed for spasms. 270 capsule 3   doxazosin (CARDURA) 4 MG tablet TAKE 1 TABLET BY MOUTH EVERY DAY 90 tablet 2   fluticasone (FLONASE) 50 MCG/ACT nasal spray Place 1 spray into both nostrils as needed. 48 mL 3   gabapentin (NEURONTIN) 100 MG capsule TAKE 1 CAPSULE BY MOUTH THREE TIMES A DAY AS NEEDED 90 capsule 3   LORazepam (ATIVAN) 0.5 MG tablet Take 1 tablet (0.5 mg total) by mouth 2 (two) times daily as needed for anxiety or sleep. 60 tablet 1   omeprazole (PRILOSEC) 40 MG capsule TAKE 1 CAPSULE (40 MG TOTAL) BY MOUTH IN THE MORNING AND AT BEDTIME. 180 capsule 1   oxyCODONE-acetaminophen  (PERCOCET/ROXICET) 5-325 MG tablet Take 1 tablet by mouth 2 (two) times daily as needed for severe pain. 60 tablet 0   potassium chloride (KLOR-CON) 10 MEQ tablet Take 1 tablet (10 mEq total) by mouth daily. 90 tablet 3   sucralfate (CARAFATE) 1 g tablet TAKE 1 TABLET BY MOUTH 3 TIMES A DAY BETWEEN MEALS 270 tablet 3   telmisartan-hydrochlorothiazide (MICARDIS HCT) 80-25 MG tablet TAKE 1 TABLET BY MOUTH EVERY DAY 90 tablet 1   traZODone (DESYREL) 50 MG tablet TAKE 1 TABLET BY MOUTH EVERYDAY AT BEDTIME 90 tablet 3   valACYclovir (VALTREX) 500 MG tablet TAKE 1 TABLET (500 MG TOTAL) BY MOUTH 2 (TWO) TIMES DAILY. FOR 5 DAYS WITH OUTBREAK 180 tablet 1   Vitamin D, Cholecalciferol, 1000 units CAPS Take 2,000 Units by mouth.     vortioxetine HBr (TRINTELLIX) 5 MG TABS tablet Take 1 tablet (5 mg total) by mouth daily. 30 tablet 5   carvedilol (COREG) 12.5 MG tablet Take 1 tablet (12.5 mg total) by mouth 2 (two) times daily. 180 tablet 3   No current facility-administered medications for this visit.    Family History  Problem Relation Age of Onset   Kidney disease Mother        ESRD   Hypertension Mother    Cancer Father        lung ca   Hypertension Sister    Kidney disease Sister    Diabetes Brother    Hypertension Brother    Diabetes Paternal Grandmother    Hypertension Sister    Hypertension Brother    Sleep apnea Son    Hypertension Son    Colon cancer Neg Hx    Stomach cancer Neg Hx    Esophageal cancer Neg Hx    Rectal cancer Neg Hx    Pancreatic cancer Neg Hx     Review of Systems  All other systems reviewed and are negative.  Exam:   BP 126/78   Pulse 98   Ht 5' 4.5" (1.638 m)   Wt 166 lb (75.3 kg)   SpO2 99%   BMI 28.05 kg/m   Weight change: '@WEIGHTCHANGE'$ @ Height:   Height: 5' 4.5" (163.8 cm)  Ht Readings from Last 3 Encounters:  04/10/21 5' 4.5" (1.638 m)  03/26/21 '5\' 4"'$  (1.626 m)  03/18/21 '5\' 4"'$  (1.626 m)    General appearance: alert, cooperative and appears  stated age Head: Normocephalic, without obvious abnormality, atraumatic Neck: no adenopathy, supple, symmetrical, trachea midline  and thyroid normal to inspection and palpation Breasts: normal appearance, no masses or tenderness Abdomen: soft, non-tender; non distended,  no masses,  no organomegaly Extremities: extremities normal, atraumatic, no cyanosis or edema Skin: Skin color, texture, turgor normal. No rashes or lesions Lymph nodes: Cervical, supraclavicular, and axillary nodes normal. No abnormal inguinal nodes palpated Neurologic: Grossly normal   Pelvic: External genitalia:  no lesions              Urethra:  normal appearing urethra with no masses, tenderness or lesions              Bartholins and Skenes: normal                 Vagina: normal appearing vagina with normal color and discharge, no lesions              Cervix: no lesions               Bimanual Exam:  Uterus:   no masses or tenderness              Adnexa: no mass, fullness, tenderness               Rectovaginal: Confirms               Anus:  normal sphincter tone, no lesions  1. GYN exam for high-risk Medicare patient Discussed breast self exam Discussed calcium and vit D intake Mammogram UTD She will schedule her colonoscopy Labs with primary  2. Postcoital bleeding Normal exam other than mild vaginal atrophy, cervix not friable - US PELVIS TRANSVAGINAL NON-OB (TV ONLY); Future  3. Postmenopausal bleeding With straining she spots  - US PELVIS TRANSVAGINAL NON-OB (TV ONLY); Future  4. Dyspareunia, female Recommended she use a lubricant  5. Screening for cervical cancer - Cytology - PAP with hpv  6. Herpes simplex infection of genitourinary system - valACYclovir (VALTREX) 500 MG tablet; Take one tablet po BID x 3 days prn outbreak  Dispense: 30 tablet; Refill: 1  7. Urinary hesitancy - Urinalysis,Complete w/RFL Culture - US PELVIS TRANSVAGINAL NON-OB (TV ONLY); Future  8. GSI (genuine stress  incontinence), female -Kegel information given - Urinalysis,Complete w/RFL Culture

## 2021-04-10 NOTE — Patient Instructions (Addendum)
Try Uberlube for vaginal lubrication with intercourse.   EXERCISE   We recommended that you start or continue a regular exercise program for good health. Physical activity is anything that gets your body moving, some is better than none. The CDC recommends 150 minutes per week of Moderate-Intensity Aerobic Activity and 2 or more days of Muscle Strengthening Activity.  Benefits of exercise are limitless: helps weight loss/weight maintenance, improves mood and energy, helps with depression and anxiety, improves sleep, tones and strengthens muscles, improves balance, improves bone density, protects from chronic conditions such as heart disease, high blood pressure and diabetes and so much more. To learn more visit: WhyNotPoker.uy  DIET: Good nutrition starts with a healthy diet of fruits, vegetables, whole grains, and lean protein sources. Drink plenty of water for hydration. Minimize empty calories, sodium, sweets. For more information about dietary recommendations visit: GeekRegister.com.ee and [email or web address]://schaefer-mitchell.com/  ALCOHOL:  Women should limit their alcohol intake to no more than 7 drinks/beers/glasses of wine (combined, not each!) per week. Moderation of alcohol intake to this level decreases your risk of breast cancer and liver damage.  If you are concerned that you may have a problem, or your friends have told you they are concerned about your drinking, there are many resources to help. A well-known program that is free, effective, and available to all people all over the nation is Alcoholics Anonymous.  Check out this site to learn more: BlockTaxes.se   CALCIUM AND VITAMIN D:  Adequate intake of calcium and Vitamin D are recommended for bone health.  You should be getting between 1000-1200 mg of calcium and 800 units of Vitamin D daily between diet and supplements  PAP SMEARS:  Pap smears, to check for  cervical cancer or precancers,  have traditionally been done yearly, scientific advances have shown that most women can have pap smears less often.  However, every woman still should have a physical exam from her gynecologist every year. It will include a breast check, inspection of the vulva and vagina to check for abnormal growths or skin changes, a visual exam of the cervix, and then an exam to evaluate the size and shape of the uterus and ovaries. We will also provide age appropriate advice regarding health maintenance, like when you should have certain vaccines, screening for sexually transmitted diseases, bone density testing, colonoscopy, mammograms, etc.   MAMMOGRAMS:  All women over 45 years old should have a routine mammogram.   COLON CANCER SCREENING: Now recommend starting at age 56. At this time colonoscopy is not covered for routine screening until 50. There are take home tests that can be done between 45-49.   COLONOSCOPY:  Colonoscopy to screen for colon cancer is recommended for all women at age 66.  We know, you hate the idea of the prep.  We agree, BUT, having colon cancer and not knowing it is worse!!  Colon cancer so often starts as a polyp that can be seen and removed at colonscopy, which can quite literally save your life!  And if your first colonoscopy is normal and you have no family history of colon cancer, most women don't have to have it again for 10 years.  Once every ten years, you can do something that may end up saving your life, right?  We will be happy to help you get it scheduled when you are ready.  Be sure to check your insurance coverage so you understand how much it will cost.  It may be covered as a  preventative service at no cost, but you should check your particular policy.      Breast Self-Awareness Breast self-awareness means being familiar with how your breasts look and feel. It involves checking your breasts regularly and reporting any changes to your health  care provider. Practicing breast self-awareness is important. A change in your breasts can be a sign of a serious medical problem. Being familiar with how your breasts look and feel allows you to find any problems early, when treatment is more likely to be successful. All women should practice breast self-awareness, including women who have had breast implants. How to do a breast self-exam One way to learn what is normal for your breasts and whether your breasts are changing is to do a breast self-exam. To do a breast self-exam: Look for Changes  Remove all the clothing above your waist. Stand in front of a mirror in a room with good lighting. Put your hands on your hips. Push your hands firmly downward. Compare your breasts in the mirror. Look for differences between them (asymmetry), such as: Differences in shape. Differences in size. Puckers, dips, and bumps in one breast and not the other. Look at each breast for changes in your skin, such as: Redness. Scaly areas. Look for changes in your nipples, such as: Discharge. Bleeding. Dimpling. Redness. A change in position. Feel for Changes Carefully feel your breasts for lumps and changes. It is best to do this while lying on your back on the floor and again while sitting or standing in the shower or tub with soapy water on your skin. Feel each breast in the following way: Place the arm on the side of the breast you are examining above your head. Feel your breast with the other hand. Start in the nipple area and make  inch (2 cm) overlapping circles to feel your breast. Use the pads of your three middle fingers to do this. Apply light pressure, then medium pressure, then firm pressure. The light pressure will allow you to feel the tissue closest to the skin. The medium pressure will allow you to feel the tissue that is a little deeper. The firm pressure will allow you to feel the tissue close to the ribs. Continue the overlapping circles,  moving downward over the breast until you feel your ribs below your breast. Move one finger-width toward the center of the body. Continue to use the  inch (2 cm) overlapping circles to feel your breast as you move slowly up toward your collarbone. Continue the up and down exam using all three pressures until you reach your armpit.  Write Down What You Find  Write down what is normal for each breast and any changes that you find. Keep a written record with breast changes or normal findings for each breast. By writing this information down, you do not need to depend only on memory for size, tenderness, or location. Write down where you are in your menstrual cycle, if you are still menstruating. If you are having trouble noticing differences in your breasts, do not get discouraged. With time you will become more familiar with the variations in your breasts and more comfortable with the exam. How often should I examine my breasts? Examine your breasts every month. If you are breastfeeding, the best time to examine your breasts is after a feeding or after using a breast pump. If you menstruate, the best time to examine your breasts is 5-7 days after your period is over. During your period,  your breasts are lumpier, and it may be more difficult to notice changes. When should I see my health care provider? See your health care provider if you notice: A change in shape or size of your breasts or nipples. A change in the skin of your breast or nipples, such as a reddened or scaly area. Unusual discharge from your nipples. A lump or thick area that was not there before. Pain in your breasts. Anything that concerns you.  Kegel Exercises  Kegel exercises can help strengthen your pelvic floor muscles. The pelvic floor is a group of muscles that support your rectum, small intestine, and bladder. In females, pelvic floor muscles also help support the womb (uterus). These muscles help you control the flow of  urine and stool. Kegel exercises are painless and simple, and they do not require any equipment. Your provider may suggest Kegel exercises to: Improve bladder and bowel control. Improve sexual response. Improve weak pelvic floor muscles after surgery to remove the uterus (hysterectomy) or pregnancy (females). Improve weak pelvic floor muscles after prostate gland removal or surgery (males). Kegel exercises involve squeezing your pelvic floor muscles, which are the same muscles you squeeze when you try to stop the flow of urine or keep from passing gas. The exercises can be done while sitting, standing, or lying down, but itis best to vary your position. Exercises How to do Kegel exercises: Squeeze your pelvic floor muscles tight. You should feel a tight lift in your rectal area. If you are a female, you should also feel a tightness in your vaginal area. Keep your stomach, buttocks, and legs relaxed. Hold the muscles tight for up to 10 seconds. Breathe normally. Relax your muscles. Repeat as told by your health care provider. Repeat this exercise daily as told by your health care provider. Continue to do this exercise for at least 4-6 weeks, or for as long as told by your healthcare provider. You may be referred to a physical therapist who can help you learn more abouthow to do Kegel exercises. Depending on your condition, your health care provider may recommend: Varying how long you squeeze your muscles. Doing several sets of exercises every day. Doing exercises for several weeks. Making Kegel exercises a part of your regular exercise routine. This information is not intended to replace advice given to you by your health care provider. Make sure you discuss any questions you have with your healthcare provider. Document Revised: 07/23/2020 Document Reviewed: 03/22/2018 Elsevier Patient Education  [place].

## 2021-04-13 LAB — CYTOLOGY - PAP
Comment: NEGATIVE
Diagnosis: NEGATIVE
High risk HPV: POSITIVE — AB

## 2021-04-15 ENCOUNTER — Ambulatory Visit: Payer: Medicare Other | Admitting: Gastroenterology

## 2021-04-15 ENCOUNTER — Encounter: Payer: Self-pay | Admitting: Gastroenterology

## 2021-04-15 VITALS — BP 100/70 | HR 104 | Ht 63.0 in | Wt 165.2 lb

## 2021-04-15 DIAGNOSIS — K219 Gastro-esophageal reflux disease without esophagitis: Secondary | ICD-10-CM | POA: Diagnosis not present

## 2021-04-15 DIAGNOSIS — R1013 Epigastric pain: Secondary | ICD-10-CM

## 2021-04-15 DIAGNOSIS — Z86010 Personal history of colonic polyps: Secondary | ICD-10-CM

## 2021-04-15 DIAGNOSIS — K5900 Constipation, unspecified: Secondary | ICD-10-CM | POA: Diagnosis not present

## 2021-04-15 DIAGNOSIS — K2940 Chronic atrophic gastritis without bleeding: Secondary | ICD-10-CM

## 2021-04-15 MED ORDER — POLYETHYLENE GLYCOL 3350 17 G PO PACK: PACK | ORAL | 0 refills | Status: DC

## 2021-04-15 NOTE — Progress Notes (Signed)
HPI :  65 year old female here for a follow-up visit for upper tract symptoms and constipation.  Recall she has a history of autoimmune gastritis and biopsies as well as a positive parietal cell antibody, history of B12 deficiency.  Her last endoscopy was performed in October 2019.  She was last seen in our office in May 2021.  She complains of multiple symptoms today.  She has upper to mid abdominal discomfort that seems to be present most of the time but can be sometimes worse with eating, sometimes better with eating.  She takes her omeprazole typically once daily and Carafate as needed.  She states the Carafate definitely helps relieve her symptoms.  She has had some early satiety over the past year or so and has lost about 7 to 10 pounds over the past few months, not trying to do so.  She states she otherwise seems to be eating okay without any vomiting.  She has no family history of gastric cancer.  She takes oxycodone a few days a week for pain.  No routine NSAID use.  She is not complaining of any reflux or dysphagia at this time.  She is also had some constipation that has been bothering her.  She has a bowel movement once every 3 to 4 days at baseline.  She has some lower abdominal cramping with this which is not new.  MiraLAX will work when she takes it to help her with her bowels but she does not take it routinely, only as needed.  No blood in her stools.  Not taking any new medications.  No family history of colon cancer.  She is not having any cardiopulmonary symptoms.  Her last colonoscopy was about 5 years ago and she had 1 small polyp removed.  She has had an extensive work-up in the past to include the following:   Workup as follows: CT scan abdomen for RLQ pain - 09/09/16, oral contrast only - hepatic steatosis, small inguinal hernias, small umbilical hernia EGD [id number] - normal EGD, biopsies taken for H pylori - showing "marked chronic gastritis" , negative for HP Colonoscopy  05/06/16 - normal colon and ileum, small adenoma, hemorrhoids. Recall colonoscopy in 5 years. [place] 03/04/16 - no gallstones, fatty liver EGD 05/2018 showed 1 cm hiatal hernia and mildly atrophic gastritis; gastric biopsies c/w autoimmune gastritis.  Repeat EGD recommended in 3-5 years.  Labs show negative IF AB, positive parietal cell AB, and B12 deficiency. Diagnosed with autoimmune gastritis.    She previously saw saw ENT with report as follows:   Flexible laryngoscopy performed today shows no evidence of mass or lesion, patient has significant reflux changes in the posterior glottis consistent with her history.    sensation of globus and fullness in the throat. She underwent ultrasound on 10/31/2019 which showed small bilateral nodules consistent with multinodular goiter largest was 1.2 cm on the left, no biopsy recommended.       Past Medical History:  Diagnosis Date   Abnormal cervical Pap smear with positive HPV DNA test 01/2014,01/2017   2015 Normal cytology with positive high-risk HPV 18/45. Colposcopy negative with negative ECC.  2018 normal cytology with positive high-risk HPV 18/45   Anemia    Apnea 05/17/2018   Autoimmune gastritis    Fatty liver    Gastritis    Heart murmur    Hypertension    Hyperthyroidism    Inappropriate sinus tachycardia 05/17/2018   Internal hemorrhoids    Osteoarthritis    Right  shoulder pain 2001   chronic pain   Thyroid disease    Hyperthyroid. Was on PTU (Dr Hampton Abbot in [place]) - stopped in 2013, stable   Tubular adenoma of colon      Past Surgical History:  Procedure Laterality Date   BREAST CYST ASPIRATION Left 2012   CESAREAN SECTION     x 4    HAND SURGERY Left    SHOULDER SURGERY Right 2002   SHOULDER SURGERY Left 2014   TUBAL LIGATION     Family History  Problem Relation Age of Onset   Kidney disease Mother        ESRD   Hypertension Mother    Lung cancer Father    Hypertension Sister    Kidney disease Sister    Hypertension Sister     Diabetes Brother    Hypertension Brother    Hypertension Brother    Diabetes Paternal Grandmother    Sleep apnea Son    Hypertension Son    Colon cancer Neg Hx    Stomach cancer Neg Hx    Esophageal cancer Neg Hx    Rectal cancer Neg Hx    Pancreatic cancer Neg Hx    Social History   Tobacco Use   Smoking status: Never   Smokeless tobacco: Never  Vaping Use   Vaping Use: Never used  Substance Use Topics   Alcohol use: Yes    Comment: RARELY   Drug use: No   Current Outpatient Medications  Medication Sig Dispense Refill   amLODipine (NORVASC) 5 MG tablet Take 1 tablet (5 mg total) by mouth daily. 30 tablet 0   aspirin EC 81 MG tablet Take 1 tablet (81 mg total) by mouth daily. 100 tablet 3   carvedilol (COREG) 12.5 MG tablet Take 1 tablet (12.5 mg total) by mouth 2 (two) times daily. 180 tablet 3   Cyanocobalamin (VITAMIN B-12) 500 MCG SUBL 1 sl qd 100 tablet 5   cyclobenzaprine (FLEXERIL) 5 MG tablet TAKE 1 TABLET BY MOUTH THREE TIMES A DAY AS NEEDED FOR MUSCLE SPASMS 60 tablet 2   Diclofenac Sodium 1.5 % SOLN USE 15 DROPS THREE TIMES DAILY EXTERNALLY FOR PAIN 150 mL 5   dicyclomine (BENTYL) 10 MG capsule Take 1 capsule (10 mg total) by mouth every 8 (eight) hours as needed for spasms. 270 capsule 3   doxazosin (CARDURA) 4 MG tablet TAKE 1 TABLET BY MOUTH EVERY DAY 90 tablet 2   fluticasone (FLONASE) 50 MCG/ACT nasal spray Place 1 spray into both nostrils as needed. 48 mL 3   gabapentin (NEURONTIN) 100 MG capsule TAKE 1 CAPSULE BY MOUTH THREE TIMES A DAY AS NEEDED 90 capsule 3   LORazepam (ATIVAN) 0.5 MG tablet Take 1 tablet (0.5 mg total) by mouth 2 (two) times daily as needed for anxiety or sleep. 60 tablet 1   omeprazole (PRILOSEC) 40 MG capsule TAKE 1 CAPSULE (40 MG TOTAL) BY MOUTH IN THE MORNING AND AT BEDTIME. 180 capsule 1   oxyCODONE-acetaminophen (PERCOCET/ROXICET) 5-325 MG tablet Take 1 tablet by mouth 2 (two) times daily as needed for severe pain. 60 tablet 0    potassium chloride (KLOR-CON) 10 MEQ tablet Take 1 tablet (10 mEq total) by mouth daily. 90 tablet 3   sucralfate (CARAFATE) 1 g tablet TAKE 1 TABLET BY MOUTH 3 TIMES A DAY BETWEEN MEALS 270 tablet 3   telmisartan-hydrochlorothiazide (MICARDIS HCT) 80-25 MG tablet TAKE 1 TABLET BY MOUTH EVERY DAY 90 tablet 1   traZODone (  DESYREL) 50 MG tablet TAKE 1 TABLET BY MOUTH EVERYDAY AT BEDTIME 90 tablet 3   valACYclovir (VALTREX) 500 MG tablet Take one tablet po BID x 3 days prn outbreak 30 tablet 1   Vitamin D, Cholecalciferol, 1000 units CAPS Take 2,000 Units by mouth.     vortioxetine HBr (TRINTELLIX) 5 MG TABS tablet Take 1 tablet (5 mg total) by mouth daily. 30 tablet 5   No current facility-administered medications for this visit.   Allergies  Allergen Reactions   Penicillins Hives   Tramadol Anaphylaxis    Memory lapses Memory lapses   Gabapentin     Nausea    Iodine Other (See Comments)    Due to hyperthyroidism   Lexapro [Escitalopram]     fatigue   Lyrica [Pregabalin] Dermatitis   Nsaids     gastritis   Voltaren [Diclofenac] Hives     Review of Systems: All systems reviewed and negative except where noted in HPI.   Lab Results  Component Value Date   WBC 5.0 12/04/2020   HGB 13.0 12/04/2020   HCT 38.2 12/04/2020   MCV 95.2 12/04/2020   PLT 307.0 12/04/2020    Lab Results  Component Value Date   CREATININE 0.80 12/04/2020   BUN 14 12/04/2020   NA 136 12/04/2020   K 3.1 (L) 12/04/2020   CL 98 12/04/2020   CO2 31 12/04/2020    Lab Results  Component Value Date   ALT 24 12/04/2020   AST 19 12/04/2020   ALKPHOS 71 12/04/2020   BILITOT 0.6 12/04/2020     Physical Exam: BP 100/70 (BP Location: Left Arm, Patient Position: Sitting, Cuff Size: Normal)   Pulse (!) 104   Ht '5\' 3"'$  (1.6 m) Comment: height measured without shoes  Wt 165 lb 4 oz (75 kg)   BMI 29.27 kg/m  Constitutional: Pleasant,well-developed, female in no acute distress. Abdominal: Soft,  nondistended, nontender.  There are no masses palpable. No hepatomegaly. Extremities: no edema Lymphadenopathy: No cervical adenopathy noted. Neurological: Alert and oriented to person place and time. Skin: Skin is warm and dry. No rashes noted. Psychiatric: Normal mood and affect. Behavior is normal.   ASSESSMENT AND PLAN: 65 year old female here for reassessment of the following:  Atrophic / autoimmune gastritis   Dyspepsia GERD Constipation History of colon polyps  As above the patient with history of autoimmune gastritis based on prior work-up.  She is having ongoing dyspepsia/epigastric pain, early satiety, some recent weight loss.  Had been doing better on twice daily PPI in the past, currently on once daily dosing but using Carafate which helps.  We discussed options.  She has at higher risk for gastric cancer and neuroendocrine tumors of her stomach with a history of autoimmune gastritis, although guidelines are unclear if in regards to how frequently to survey patients with this.  I offered her an interval endoscopy given its been few years since we have looked in light of her early satiety and recent weight loss in light of these ongoing symptoms.  I discussed risk benefits of the exam and anesthesia and she wants to proceed.  In the interim I asked her to increase her omeprazole back to twice daily dosing as she feels generally better on that regimen.  Her reflux is otherwise fairly well controlled, she endorses today.  She is not taking much for her bowels and seems to respond to MiraLAX in the past.  Recommend daily MiraLAX for constipation and titrate up or down as needed.  She had 1 small adenoma 5 years ago, I discussed interval changes in the guideline recommendations since her last exam.  I think okay to extend her surveillance to 7 years.  She is in agreement with this.  If endoscopy is unrevealing and her symptoms persist despite changes in her medicines we may consider right  upper quadrant ultrasound to assess for gallstones and/or CT scan imaging.  Last CT scan about 4 years ago did not show a clear cause for her symptoms at that time.  Plan: - EGD in the [place] - increased omeprazole to BID dosing for now, did discuss long term potential risks, do not want to use lowest dose needed to control symptoms - continue carafate - change colonoscopy recall to 04/2023 - miralax daily and titrate up as needed - consider RUQ or CT if no improvement  Jolly Mango, MD [facility]

## 2021-04-15 NOTE — Patient Instructions (Addendum)
If you are age 64 or older, your body mass index should be between 23-30. Your Body mass index is 29.27 kg/m. If this is out of the aforementioned range listed, please consider follow up with your Primary Care Provider.  If you are age 66 or younger, your body mass index should be between 19-25. Your Body mass index is 29.27 kg/m. If this is out of the aformentioned range listed, please consider follow up with your Primary Care Provider.   __________________________________________________________  The [facility] providers would like to encourage you to use [facility] to communicate with providers for non-urgent requests or questions.  Due to long hold times on the telephone, sending your provider a message by [facility] may be a faster and more efficient way to get a response.  Please allow 48 business hours for a response.  Please remember that this is for non-urgent requests.    You have been scheduled for an endoscopy. Please follow written instructions given to you at your visit today. If you use inhalers (even only as needed), please bring them with you on the day of your procedure.   Increase your omeprazole to twice daily.  Continue Carafate.  You will be due for a recall colonoscopy in 04-2023. We will send you a reminder in the mail when it gets closer to that time.  Please purchase the following medications over the counter and take as directed: Miralax: Take once daily, titrate as needed  Thank you for entrusting me with your care and for choosing [facility], Dr. [place] Cellar

## 2021-04-17 ENCOUNTER — Encounter (HOSPITAL_BASED_OUTPATIENT_CLINIC_OR_DEPARTMENT_OTHER): Payer: Self-pay | Admitting: Cardiovascular Disease

## 2021-04-17 ENCOUNTER — Other Ambulatory Visit: Payer: Self-pay

## 2021-04-17 ENCOUNTER — Ambulatory Visit (HOSPITAL_BASED_OUTPATIENT_CLINIC_OR_DEPARTMENT_OTHER): Payer: Medicare Other | Admitting: Cardiovascular Disease

## 2021-04-17 VITALS — BP 138/84 | HR 73 | Ht 63.0 in | Wt 168.0 lb

## 2021-04-17 DIAGNOSIS — Z8489 Family history of other specified conditions: Secondary | ICD-10-CM | POA: Diagnosis not present

## 2021-04-17 DIAGNOSIS — I119 Hypertensive heart disease without heart failure: Secondary | ICD-10-CM | POA: Diagnosis not present

## 2021-04-17 DIAGNOSIS — I10 Essential (primary) hypertension: Secondary | ICD-10-CM | POA: Diagnosis not present

## 2021-04-17 NOTE — Assessment & Plan Note (Addendum)
Karina Newman had moderate LVH with concentric hypertrophy measuring 1.4 cm on her echo in 2014.  She has not had any heart failure symptoms.  Her son passed away of sudden cardiac death last year.  She brings his death certificate showing that he died of hypertrophic cardiomyopathy.  He was at work talking and had cardiac arrest without warning.  This is concerning for hypertrophic cardiomyopathy.  She states that one of her daughters was tested and told that she had a mild case.  We will get an echocardiogram.  She is reporting some shortness of breath.  I do not hear a murmur on exam.  We will likely need to get a cardiac MRI and ambulatory monitor based on these findings.  We did also discussed genetics testing and will think about this in the future.

## 2021-04-17 NOTE — Progress Notes (Signed)
Cardiology Office Note   Date:  04/17/2021   ID:  Karina Newman, DOB 1955-12-14, MRN [id number]  PCP:  Cassandria Anger, MD  Cardiologist:   Skeet Latch, MD   No chief complaint on file.    History of Present Illness: Karina Newman is a 65 y.o. female with hypertension, autoimmune gastritis, and hyperthyroidism here for follow up.  She was initially seen for the evaluation of chest pain and abnormal EKG.  [name] saw Dr. Alain Marion 04/2018 and reported L sided chest pain.  EKG at that time revealed sinus rhythm with LVH and nonspecific T wave abnormality's.  [name] reported intermittent L sided chest pain that felt like spasm.  She was referred for an exercise Myoview 05/2018 that revealed LVEF 54% with 1 mm ST depression with exertion.  Perfusion imaging showed a small, mild defect in the basal to mid anteroseptal and apical septal regions.  It was unclear whether this represented breast attenuation artifact or ischemia.  TID was 1.2.  She was referred for coronary CT-A 06/2018 that showed no CAD.  Coronary calcium score was 0.    [name]'s blood pressure remained elevated.  Doxazosin was added to her regimen. At her last appointment she was doing well, and reported her PCP had increased her gabapentin. Today, she is not feeling well. Last year her son suddenly passed away from hypertrophic cardiomyopathy. She is grieving and notes that she is able to talk with her family for support. Since her last visit, she continues to feel some sharp chest pains inferior to her breast. This occurs randomly while sitting down. While walking up stairs, she becomes lightheaded and fatigued, but denies any exertional chest pain. At home her blood pressure has been well controlled around 120-130s/70-80s. In the last week of September she is leaving for [place] for about a month. She denies any palpitations, or shortness of breath. No headaches, syncope, orthopnea, or PND. Also has no lower  extremity edema. Tearful at times.   Past Medical History:  Diagnosis Date   Abnormal cervical Pap smear with positive HPV DNA test 01/2014,01/2017   2015 Normal cytology with positive high-risk HPV 18/45. Colposcopy negative with negative ECC.  2018 normal cytology with positive high-risk HPV 18/45   Anemia    Apnea 05/17/2018   Autoimmune gastritis    Fatty liver    Gastritis    Heart murmur    Hypertension    Hyperthyroidism    Inappropriate sinus tachycardia 05/17/2018   Internal hemorrhoids    Osteoarthritis    Right shoulder pain 2001   chronic pain   Thyroid disease    Hyperthyroid. Was on PTU (Dr Hampton Abbot in [place]) - stopped in 2013, stable   Tubular adenoma of colon     Past Surgical History:  Procedure Laterality Date   BREAST CYST ASPIRATION Left 2012   CESAREAN SECTION     x 4    HAND SURGERY Left    SHOULDER SURGERY Right 2002   SHOULDER SURGERY Left 2014   TUBAL LIGATION       Current Outpatient Medications  Medication Sig Dispense Refill   amLODipine (NORVASC) 5 MG tablet Take 1 tablet (5 mg total) by mouth daily. 30 tablet 0   aspirin EC 81 MG tablet Take 1 tablet (81 mg total) by mouth daily. 100 tablet 3   carvedilol (COREG) 12.5 MG tablet Take 1 tablet (12.5 mg total) by mouth 2 (two) times daily. 180 tablet 3  Cyanocobalamin (VITAMIN B-12) 500 MCG SUBL 1 sl qd 100 tablet 5   cyclobenzaprine (FLEXERIL) 5 MG tablet TAKE 1 TABLET BY MOUTH THREE TIMES A DAY AS NEEDED FOR MUSCLE SPASMS 60 tablet 2   Diclofenac Sodium 1.5 % SOLN USE 15 DROPS THREE TIMES DAILY EXTERNALLY FOR PAIN 150 mL 5   dicyclomine (BENTYL) 10 MG capsule Take 1 capsule (10 mg total) by mouth every 8 (eight) hours as needed for spasms. 270 capsule 3   doxazosin (CARDURA) 4 MG tablet TAKE 1 TABLET BY MOUTH EVERY DAY 90 tablet 2   fluticasone (FLONASE) 50 MCG/ACT nasal spray Place 1 spray into both nostrils as needed. 48 mL 3   gabapentin (NEURONTIN) 100 MG capsule TAKE 1 CAPSULE BY MOUTH THREE  TIMES A DAY AS NEEDED 90 capsule 3   LORazepam (ATIVAN) 0.5 MG tablet Take 1 tablet (0.5 mg total) by mouth 2 (two) times daily as needed for anxiety or sleep. 60 tablet 1   omeprazole (PRILOSEC) 40 MG capsule TAKE 1 CAPSULE (40 MG TOTAL) BY MOUTH IN THE MORNING AND AT BEDTIME. 180 capsule 1   oxyCODONE-acetaminophen (PERCOCET/ROXICET) 5-325 MG tablet Take 1 tablet by mouth 2 (two) times daily as needed for severe pain. 60 tablet 0   polyethylene glycol (MIRALAX) 17 g packet Take daily as directed. Increase or decrease as needed 14 each 0   potassium chloride (KLOR-CON) 10 MEQ tablet Take 1 tablet (10 mEq total) by mouth daily. 90 tablet 3   sucralfate (CARAFATE) 1 g tablet TAKE 1 TABLET BY MOUTH 3 TIMES A DAY BETWEEN MEALS 270 tablet 3   telmisartan-hydrochlorothiazide (MICARDIS HCT) 80-25 MG tablet TAKE 1 TABLET BY MOUTH EVERY DAY 90 tablet 1   traZODone (DESYREL) 50 MG tablet TAKE 1 TABLET BY MOUTH EVERYDAY AT BEDTIME 90 tablet 3   valACYclovir (VALTREX) 500 MG tablet Take one tablet po BID x 3 days prn outbreak 30 tablet 1   Vitamin D, Cholecalciferol, 1000 units CAPS Take 2,000 Units by mouth.     vortioxetine HBr (TRINTELLIX) 5 MG TABS tablet Take 1 tablet (5 mg total) by mouth daily. 30 tablet 5   No current facility-administered medications for this visit.    Allergies:   Penicillins, Tramadol, Gabapentin, Iodine, Lexapro [escitalopram], Lyrica [pregabalin], Nsaids, and Voltaren [diclofenac]    Social History:  The patient  reports that she has never smoked. She has never used smokeless tobacco. She reports current alcohol use. She reports that she does not use drugs.   Family History:  The patient's family history includes Diabetes in her brother and paternal grandmother; Hypertension in her brother, brother, mother, sister, sister, and son; Kidney disease in her mother and sister; Lung cancer in her father; Sleep apnea in her son.    ROS:   Please see the history of present  illness. (+) Chest pain (+) Lightheadedness (+) Fatigue (+) Stress All other systems are reviewed and negative.    PHYSICAL EXAM: VS:  BP 138/84   Pulse 73   Ht '5\' 3"'$  (1.6 m)   Wt 168 lb (76.2 kg)   BMI 29.76 kg/m  , BMI Body mass index is 29.76 kg/m. GENERAL:  Well appearing HEENT: Pupils equal round and reactive, fundi not visualized, oral mucosa unremarkable NECK:  No jugular venous distention, waveform within normal limits, carotid upstroke brisk and symmetric, no bruits, no thyromegaly LYMPHATICS:  No cervical adenopathy LUNGS:  Clear to auscultation bilaterally HEART:  RRR.  PMI not displaced or sustained,S1 and  S2 within normal limits, no S3, no S4, no clicks, no rubs,no  murmurs ABD:  Flat, positive bowel sounds normal in frequency in pitch, no bruits, no rebound, no guarding, no midline pulsatile mass, no hepatomegaly, no splenomegaly EXT:  2 plus pulses throughout, no edema, no cyanosis no clubbing SKIN:  No rashes no nodules NEURO:  Cranial nerves II through XII grossly intact, motor grossly intact throughout PSYCH:  Cognitively intact, oriented to person place and time   EKG:  04/17/2021: Sinus rhythm. Rate 73 bpm. LVH. 04/18/18: sinus rhythm.  Rate 81 bpm. Non-specific ST changes.   US Thyroid 10/31/2019: IMPRESSION: 1. Mildly enlarged, mildly heterogeneous thyroid gland as detailed above. 2. There is a 1.2 cm TR 4 thyroid nodule in the left inferior thyroid gland. A 1 year follow-up ultrasound is recommended for this thyroid nodule. 3. There is a 1.1 cm TR 3 thyroid nodule in the right inferior thyroid gland. No further follow-up is required for this thyroid nodule.  Coronary CT-A 06/26/18: No CAD.  Coronary calcium score 0.  Exercise Myoview 05/30/18: The left ventricular ejection fraction is mildly decreased (45-54%). Nuclear stress EF: 54%. Blood pressure demonstrated a normal response to exercise. There was 19m of horizontal ST depression in the  inferolateral leads at peak exercise with T wave inversions in recovery There is a small defect of mild severity present in the basal anteroseptal, mid anteroseptal and apical septal location. The defect is reversible. Cannot rule out small area of ischemia vs. Variations in breast attenuation artifact. There is transient ischemic dilatation with TID of 1.2 which could indicate multivessel CAD. Consider coronary CTA for further evaulation. This is an intermediate risk study due to increased TID.  Echo 03/22/13: LVEF 55%.  Moderate LVH.  Grade 1 diastolic dysfunction. Trace MR.  Moderate TR.   Recent Labs: 12/04/2020: ALT 24; BUN 14; Creatinine, Ser 0.80; Hemoglobin 13.0; Platelets 307.0; Potassium 3.1; Sodium 136; TSH 0.44    Lipid Panel    Component Value Date/Time   CHOL 161 12/04/2020 1408   TRIG 101.0 12/04/2020 1408   HDL 40.20 12/04/2020 1408   CHOLHDL 4 12/04/2020 1408   VLDL 20.2 12/04/2020 1408   LDLCALC 101 (H) 12/04/2020 1408     Wt Readings from Last 3 Encounters:  04/17/21 168 lb (76.2 kg)  04/15/21 165 lb 4 oz (75 kg)  04/10/21 166 lb (75.3 kg)      ASSESSMENT AND PLAN: HTN (hypertension), benign Blood pressure is elevated today but has been well-controlled at home.  She is upset today about having to talk about the sudden cardiac death of her son.  Continue current regimen of amlodipine, carvedilol, doxazosin, telmisartan, and HCTZ.  LVH (left ventricular hypertrophy) due to hypertensive disease Ms. Bontrager had moderate LVH with concentric hypertrophy measuring 1.4 cm on her echo in 2014.  She has not had any heart failure symptoms.  Her son passed away of sudden cardiac death last year.  She brings his death certificate showing that he died of hypertrophic cardiomyopathy.  He was at work talking and had cardiac arrest without warning.  This is concerning for hypertrophic cardiomyopathy.  She states that one of her daughters was tested and told that she had a mild case.   We will get an echocardiogram.  She is reporting some shortness of breath.  I do not hear a murmur on exam.  We will likely need to get a cardiac MRI and ambulatory monitor based on these findings.  We did also  discussed genetics testing and will think about this in the future.  # Inappropriate sinus tachycardia:  Improved on carvedilol.  Current medicines are reviewed at length with the patient today.  The patient does not have concerns regarding medicines.  The following changes have been made: None  Labs/ tests ordered today include:   Orders Placed This Encounter  Procedures   EKG 12-Lead   ECHOCARDIOGRAM COMPLETE      Disposition:   FU with Claudio Mondry C. Oval Linsey, MD, [facility] in 2 months.   I,Mathew Stumpf,acting as a Education administrator for Skeet Latch, MD.,have documented all relevant documentation on the behalf of Skeet Latch, MD,as directed by  Skeet Latch, MD while in the presence of Skeet Latch, MD.  I, [place] Oval Linsey, MD have reviewed all documentation for this visit.  The documentation of the exam, diagnosis, procedures, and orders on 04/17/2021 are all accurate and complete.  Time spent: 45 minutes-Greater than 50% of this time was spent in counseling, explanation of diagnosis, planning of further management, and coordination of care.    Signed, Aayushi Solorzano C. Oval Linsey, MD, [facility]  04/17/2021 12:46 PM    [place]

## 2021-04-17 NOTE — Patient Instructions (Signed)
Medication Instructions:  Your physician recommends that you continue on your current medications as directed. Please refer to the Current Medication list given to you today.   *If you need a refill on your cardiac medications before your next appointment, please call your pharmacy*  Lab Work: NONE  Testing/Procedures: Your physician has requested that you have an echocardiogram. Echocardiography is a painless test that uses sound waves to create images of your heart. It provides your doctor with information about the size and shape of your heart and how well your heart's chambers and valves are working. This procedure takes approximately one hour. There are no restrictions for this procedure. [address]   Follow-Up: At [facility], you and your health needs are our priority.  As part of our continuing mission to provide you with exceptional heart care, we have created designated Provider Care Teams.  These Care Teams include your primary Cardiologist (physician) and Advanced Practice Providers (APPs -  Physician Assistants and Nurse Practitioners) who all work together to provide you with the care you need, when you need it.  We recommend signing up for the patient portal called "MyChart".  Sign up information is provided on this After Visit Summary.  MyChart is used to connect with patients for Virtual Visits (Telemedicine).  Patients are able to view lab/test results, encounter notes, upcoming appointments, etc.  Non-urgent messages can be sent to your provider as well.   To learn more about what you can do with MyChart, go to [email or web address].    Your next appointment:   3 month(s)  The format for your next appointment:   In Person  Provider:   Skeet Latch, MD or Laurann Montana, NP

## 2021-04-17 NOTE — Assessment & Plan Note (Signed)
Blood pressure is elevated today but has been well-controlled at home.  She is upset today about having to talk about the sudden cardiac death of her son.  Continue current regimen of amlodipine, carvedilol, doxazosin, telmisartan, and HCTZ.

## 2021-04-21 ENCOUNTER — Other Ambulatory Visit: Payer: Medicare Other

## 2021-04-22 ENCOUNTER — Ambulatory Visit: Payer: Medicare Other | Admitting: Rheumatology

## 2021-04-22 ENCOUNTER — Encounter: Payer: Self-pay | Admitting: Rheumatology

## 2021-04-22 ENCOUNTER — Other Ambulatory Visit: Payer: Self-pay

## 2021-04-22 ENCOUNTER — Telehealth: Payer: Self-pay

## 2021-04-22 VITALS — BP 115/75 | HR 85 | Ht 63.0 in | Wt 167.6 lb

## 2021-04-22 DIAGNOSIS — M170 Bilateral primary osteoarthritis of knee: Secondary | ICD-10-CM

## 2021-04-22 DIAGNOSIS — M7061 Trochanteric bursitis, right hip: Secondary | ICD-10-CM | POA: Diagnosis not present

## 2021-04-22 DIAGNOSIS — E559 Vitamin D deficiency, unspecified: Secondary | ICD-10-CM

## 2021-04-22 DIAGNOSIS — E0590 Thyrotoxicosis, unspecified without thyrotoxic crisis or storm: Secondary | ICD-10-CM

## 2021-04-22 DIAGNOSIS — Z8719 Personal history of other diseases of the digestive system: Secondary | ICD-10-CM

## 2021-04-22 DIAGNOSIS — G8929 Other chronic pain: Secondary | ICD-10-CM

## 2021-04-22 DIAGNOSIS — M25511 Pain in right shoulder: Secondary | ICD-10-CM

## 2021-04-22 DIAGNOSIS — I119 Hypertensive heart disease without heart failure: Secondary | ICD-10-CM

## 2021-04-22 DIAGNOSIS — D126 Benign neoplasm of colon, unspecified: Secondary | ICD-10-CM | POA: Diagnosis not present

## 2021-04-22 DIAGNOSIS — I10 Essential (primary) hypertension: Secondary | ICD-10-CM | POA: Diagnosis not present

## 2021-04-22 DIAGNOSIS — M5030 Other cervical disc degeneration, unspecified cervical region: Secondary | ICD-10-CM

## 2021-04-22 DIAGNOSIS — G4709 Other insomnia: Secondary | ICD-10-CM

## 2021-04-22 DIAGNOSIS — K760 Fatty (change of) liver, not elsewhere classified: Secondary | ICD-10-CM

## 2021-04-22 DIAGNOSIS — H43391 Other vitreous opacities, right eye: Secondary | ICD-10-CM | POA: Diagnosis not present

## 2021-04-22 DIAGNOSIS — M722 Plantar fascial fibromatosis: Secondary | ICD-10-CM

## 2021-04-22 DIAGNOSIS — M5010 Cervical disc disorder with radiculopathy, unspecified cervical region: Secondary | ICD-10-CM

## 2021-04-22 DIAGNOSIS — M7062 Trochanteric bursitis, left hip: Secondary | ICD-10-CM

## 2021-04-22 NOTE — Telephone Encounter (Signed)
Submitted for VOB 04/22/2021.

## 2021-04-22 NOTE — Patient Instructions (Signed)
Knee Exercises Ask your health care provider which exercises are safe for you. Do exercises exactly as told by your health care provider and adjust them as directed. It is normal to feel mild stretching, pulling, tightness, or discomfort as you do these exercises. Stop right away if you feel sudden pain or your pain gets worse. Do not begin these exercises until told by your health care provider. Stretching and range-of-motion exercises These exercises warm up your muscles and joints and improve the movement and flexibility of your knee. These exercises also help to relieve pain and swelling. Knee extension, prone Lie on your abdomen (prone position) on a bed. Place your left / right knee just beyond the edge of the surface so your knee is not on the bed. You can put a towel under your left / right thigh just above your kneecap for comfort. Relax your leg muscles and allow gravity to straighten your knee (extension). You should feel a stretch behind your left / right knee. Hold this position for __________ seconds. Scoot up so your knee is supported between repetitions. Repeat __________ times. Complete this exercise __________ times a day. Knee flexion, active  Lie on your back with both legs straight. If this causes back discomfort, bend your left / right knee so your foot is flat on the floor. Slowly slide your left / right heel back toward your buttocks. Stop when you feel a gentle stretch in the front of your knee or thigh (flexion). Hold this position for __________ seconds. Slowly slide your left / right heel back to the starting position. Repeat __________ times. Complete this exercise __________ times a day. Quadriceps stretch, prone  Lie on your abdomen on a firm surface, such as a bed or padded floor. Bend your left / right knee and hold your ankle. If you cannot reach your ankle or pant leg, loop a belt around your foot and grab the belt instead. Gently pull your heel toward your  buttocks. Your knee should not slide out to the side. You should feel a stretch in the front of your thigh and knee (quadriceps). Hold this position for __________ seconds. Repeat __________ times. Complete this exercise __________ times a day. Hamstring, supine Lie on your back (supine position). Loop a belt or towel over the ball of your left / right foot. The ball of your foot is on the walking surface, right under your toes. Straighten your left / right knee and slowly pull on the belt to raise your leg until you feel a gentle stretch behind your knee (hamstring). Do not let your knee bend while you do this. Keep your other leg flat on the floor. Hold this position for __________ seconds. Repeat __________ times. Complete this exercise __________ times a day. Strengthening exercises These exercises build strength and endurance in your knee. Endurance is the ability to use your muscles for a long time, even after they get tired. Quadriceps, isometric This exercise stretches the muscles in front of your thigh (quadriceps) without moving your knee joint (isometric). Lie on your back with your left / right leg extended and your other knee bent. Put a rolled towel or small pillow under your knee if told by your health care provider. Slowly tense the muscles in the front of your left / right thigh. You should see your kneecap slide up toward your hip or see increased dimpling just above the knee. This motion will push the back of the knee toward the floor. For __________   seconds, hold the muscle as tight as you can without increasing your pain. Relax the muscles slowly and completely. Repeat __________ times. Complete this exercise __________ times a day. Straight leg raises This exercise stretches the muscles in front of your thigh (quadriceps) and the muscles that move your hips (hip flexors). Lie on your back with your left / right leg extended and your other knee bent. Tense the muscles in  the front of your left / right thigh. You should see your kneecap slide up or see increased dimpling just above the knee. Your thigh may even shake a bit. Keep these muscles tight as you raise your leg 4-6 inches (10-15 cm) off the floor. Do not let your knee bend. Hold this position for __________ seconds. Keep these muscles tense as you lower your leg. Relax your muscles slowly and completely after each repetition. Repeat __________ times. Complete this exercise __________ times a day. Hamstring, isometric Lie on your back on a firm surface. Bend your left / right knee about __________ degrees. Dig your left / right heel into the surface as if you are trying to pull it toward your buttocks. Tighten the muscles in the back of your thighs (hamstring) to "dig" as hard as you can without increasing any pain. Hold this position for __________ seconds. Release the tension gradually and allow your muscles to relax completely for __________ seconds after each repetition. Repeat __________ times. Complete this exercise __________ times a day. Hamstring curls If told by your health care provider, do this exercise while wearing ankle weights. Begin with __________ lb weights. Then increase the weight by 1 lb (0.5 kg) increments. Do not wear ankle weights that are more than __________ lb. Lie on your abdomen with your legs straight. Bend your left / right knee as far as you can without feeling pain. Keep your hips flat against the floor. Hold this position for __________ seconds. Slowly lower your leg to the starting position. Repeat __________ times. Complete this exercise __________ times a day. Squats This exercise strengthens the muscles in front of your thigh and knee (quadriceps). Stand in front of a table, with your feet and knees pointing straight ahead. You may rest your hands on the table for balance but not for support. Slowly bend your knees and lower your hips like you are going to sit in a  chair. Keep your weight over your heels, not over your toes. Keep your lower legs upright so they are parallel with the table legs. Do not let your hips go lower than your knees. Do not bend lower than told by your health care provider. If your knee pain increases, do not bend as low. Hold the squat position for __________ seconds. Slowly push with your legs to return to standing. Do not use your hands to pull yourself to standing. Repeat __________ times. Complete this exercise __________ times a day. Wall slides This exercise strengthens the muscles in front of your thigh and knee (quadriceps). Lean your back against a smooth wall or door, and walk your feet out 18-24 inches (46-61 cm) from it. Place your feet hip-width apart. Slowly slide down the wall or door until your knees bend __________ degrees. Keep your knees over your heels, not over your toes. Keep your knees in line with your hips. Hold this position for __________ seconds. Repeat __________ times. Complete this exercise __________ times a day. Straight leg raises This exercise strengthens the muscles that rotate the leg at the hip and   move it away from your body (hip abductors). Lie on your side with your left / right leg in the top position. Lie so your head, shoulder, knee, and hip line up. You may bend your bottom knee to help you keep your balance. Roll your hips slightly forward so your hips are stacked directly over each other and your left / right knee is facing forward. Leading with your heel, lift your top leg 4-6 inches (10-15 cm). You should feel the muscles in your outer hip lifting. Do not let your foot drift forward. Do not let your knee roll toward the ceiling. Hold this position for __________ seconds. Slowly return your leg to the starting position. Let your muscles relax completely after each repetition. Repeat __________ times. Complete this exercise __________ times a day. Straight leg raises This  exercise stretches the muscles that move your hips away from the front of the pelvis (hip extensors). Lie on your abdomen on a firm surface. You can put a pillow under your hips if that is more comfortable. Tense the muscles in your buttocks and lift your left / right leg about 4-6 inches (10-15 cm). Keep your knee straight as you lift your leg. Hold this position for __________ seconds. Slowly lower your leg to the starting position. Let your leg relax completely after each repetition. Repeat __________ times. Complete this exercise __________ times a day. This information is not intended to replace advice given to you by your health care provider. Make sure you discuss any questions you have with your health care provider. Document Revised: 05/23/2018 Document Reviewed: 05/23/2018 Elsevier Patient Education  2022 [place]. Cervical Strain and Sprain Rehab Ask your health care provider which exercises are safe for you. Do exercises exactly as told by your health care provider and adjust them as directed. It is normal to feel mild stretching, pulling, tightness, or discomfort as you do these exercises. Stop right away if you feel sudden pain or your pain gets worse. Do not begin these exercises until told by your health care provider. Stretching and range-of-motion exercises Cervical side bending  Using good posture, sit on a stable chair or stand up. Without moving your shoulders, slowly tilt your left / right ear to your shoulder until you feel a stretch in the opposite side neck muscles. You should be looking straight ahead. Hold for __________ seconds. Repeat with the other side of your neck. Repeat __________ times. Complete this exercise __________ times a day. Cervical rotation  Using good posture, sit on a stable chair or stand up. Slowly turn your head to the side as if you are looking over your left / right shoulder. Keep your eyes level with the ground. Stop when you feel a  stretch along the side and the back of your neck. Hold for __________ seconds. Repeat this by turning to your other side. Repeat __________ times. Complete this exercise __________ times a day. Thoracic extension and pectoral stretch Roll a towel or a small blanket so it is about 4 inches (10 cm) in diameter. Lie down on your back on a firm surface. Put the towel lengthwise, under your spine in the middle of your back. It should not be under your shoulder blades. The towel should line up with your spine from your middle back to your lower back. Put your hands behind your head and let your elbows fall out to your sides. Hold for __________ seconds. Repeat __________ times. Complete this exercise __________ times a day. Strengthening  exercises Isometric upper cervical flexion Lie on your back with a thin pillow behind your head and a small rolled-up towel under your neck. Gently tuck your chin toward your chest and nod your head down to look toward your feet. Do not lift your head off the pillow. Hold for __________ seconds. Release the tension slowly. Relax your neck muscles completely before you repeat this exercise. Repeat __________ times. Complete this exercise __________ times a day. Isometric cervical extension  Stand about 6 inches (15 cm) away from a wall, with your back facing the wall. Place a soft object, about 6-8 inches (15-20 cm) in diameter, between the back of your head and the wall. A soft object could be a small pillow, a ball, or a folded towel. Gently tilt your head back and press into the soft object. Keep your jaw and forehead relaxed. Hold for __________ seconds. Release the tension slowly. Relax your neck muscles completely before you repeat this exercise. Repeat __________ times. Complete this exercise __________ times a day. Posture and body mechanics Body mechanics refers to the movements and positions of your body while you do your daily activities. Posture is  part of body mechanics. Good posture and healthy body mechanics can help to relieve stress in your body's tissues and joints. Good posture means that your spine is in its natural S-curve position (your spine is neutral), your shoulders are pulled back slightly, and your head is not tipped forward. The following are general guidelines for applying improved posture and body mechanics to your everyday activities. Sitting  When sitting, keep your spine neutral and keep your feet flat on the floor. Use a footrest, if necessary, and keep your thighs parallel to the floor. Avoid rounding your shoulders, and avoid tilting your head forward. When working at a desk or a computer, keep your desk at a height where your hands are slightly lower than your elbows. Slide your chair under your desk so you are close enough to maintain good posture. When working at a computer, place your monitor at a height where you are looking straight ahead and you do not have to tilt your head forward or downward to look at the screen. Standing  When standing, keep your spine neutral and keep your feet about hip-width apart. Keep a slight bend in your knees. Your ears, shoulders, and hips should line up. When you do a task in which you stand in one place for a long time, place one foot up on a stable object that is 2-4 inches (5-10 cm) high, such as a footstool. This helps keep your spine neutral. Resting When lying down and resting, avoid positions that are most painful for you. Try to support your neck in a neutral position. You can use a contour pillow or a small rolled-up towel. Your pillow should support your neck but not push on it. This information is not intended to replace advice given to you by your health care provider. Make sure you discuss any questions you have with your health care provider. Document Revised: 11/22/2018 Document Reviewed: 05/03/2018 Elsevier Patient Education  [place].

## 2021-04-22 NOTE — Telephone Encounter (Signed)
Please apply for bilateral knee visco, per Dr. Estanislado Pandy. Thanks!  Can be scheduled after 05/30/2021 per Dr. Estanislado Pandy.

## 2021-04-28 ENCOUNTER — Encounter: Payer: Self-pay | Admitting: Gastroenterology

## 2021-04-28 ENCOUNTER — Ambulatory Visit: Payer: Medicare Other | Admitting: Orthopaedic Surgery

## 2021-04-28 ENCOUNTER — Ambulatory Visit (AMBULATORY_SURGERY_CENTER): Payer: Medicare Other | Admitting: Gastroenterology

## 2021-04-28 ENCOUNTER — Other Ambulatory Visit: Payer: Self-pay

## 2021-04-28 VITALS — BP 121/74 | HR 80 | Temp 96.9°F | Resp 19 | Ht 63.0 in | Wt 165.0 lb

## 2021-04-28 DIAGNOSIS — K2940 Chronic atrophic gastritis without bleeding: Secondary | ICD-10-CM | POA: Diagnosis not present

## 2021-04-28 DIAGNOSIS — R1310 Dysphagia, unspecified: Secondary | ICD-10-CM | POA: Diagnosis not present

## 2021-04-28 DIAGNOSIS — K449 Diaphragmatic hernia without obstruction or gangrene: Secondary | ICD-10-CM

## 2021-04-28 DIAGNOSIS — R1013 Epigastric pain: Secondary | ICD-10-CM

## 2021-04-28 DIAGNOSIS — K2950 Unspecified chronic gastritis without bleeding: Secondary | ICD-10-CM | POA: Diagnosis not present

## 2021-04-28 DIAGNOSIS — R634 Abnormal weight loss: Secondary | ICD-10-CM

## 2021-04-28 MED ORDER — SODIUM CHLORIDE 0.9 % IV SOLN: 500.0000 mL | Freq: Once | INTRAVENOUS | Status: DC

## 2021-04-28 NOTE — Patient Instructions (Signed)
YOU HAD AN ENDOSCOPIC PROCEDURE TODAY AT THE [facility]:   Refer to the procedure report that was given to you for any specific questions about what was found during the examination.  If the procedure report does not answer your questions, please call your gastroenterologist to clarify.  If you requested that your care partner not be given the details of your procedure findings, then the procedure report has been included in a sealed envelope for you to review at your convenience later.  YOU SHOULD EXPECT: Some feelings of bloating in the abdomen. Passage of more gas than usual.  Walking can help get rid of the air that was put into your GI tract during the procedure and reduce the bloating. If you had a lower endoscopy (such as a colonoscopy or flexible sigmoidoscopy) you may notice spotting of blood in your stool or on the toilet paper. If you underwent a bowel prep for your procedure, you may not have a normal bowel movement for a few days.  Please Note:  You might notice some irritation and congestion in your nose or some drainage.  This is from the oxygen used during your procedure.  There is no need for concern and it should clear up in a day or so.  SYMPTOMS TO REPORT IMMEDIATELY:    Following upper endoscopy (EGD)  Vomiting of blood or coffee ground material  New chest pain or pain under the shoulder blades  Painful or persistently difficult swallowing  New shortness of breath  Fever of 100F or higher  Black, tarry-looking stools  For urgent or emergent issues, a gastroenterologist can be reached at any hour by calling [phone]. Do not use MyChart messaging for urgent concerns.    DIET:  We do recommend a small meal at first, but then you may proceed to your regular diet.  Drink plenty of fluids but you should avoid alcoholic beverages for 24 hours.  ACTIVITY:  You should plan to take it easy for the rest of today and you should NOT DRIVE or use heavy machinery  until tomorrow (because of the sedation medicines used during the test).    FOLLOW UP: Our staff will call the number listed on your records 48-72 hours following your procedure to check on you and address any questions or concerns that you may have regarding the information given to you following your procedure. If we do not reach you, we will leave a message.  We will attempt to reach you two times.  During this call, we will ask if you have developed any symptoms of COVID 19. If you develop any symptoms (ie: fever, flu-like symptoms, shortness of breath, cough etc.) before then, please call [phone].  If you test positive for Covid 19 in the 2 weeks post procedure, please call and report this information to us.    If any biopsies were taken you will be contacted by phone or by letter within the next 1-3 weeks.  Please call us at [phone] if you have not heard about the biopsies in 3 weeks.    SIGNATURES/CONFIDENTIALITY: You and/or your care partner have signed paperwork which will be entered into your electronic medical record.  These signatures attest to the fact that that the information above on your After Visit Summary has been reviewed and is understood.  Full responsibility of the confidentiality of this discharge information lies with you and/or your care-partner. 

## 2021-04-28 NOTE — Progress Notes (Signed)
VS completed by NS.  Medical history reviewed and updated.

## 2021-04-28 NOTE — Progress Notes (Signed)
Called to room to assist during endoscopic procedure.  Patient ID and intended procedure confirmed with present staff. Received instructions for my participation in the procedure from the performing physician.  

## 2021-04-28 NOTE — Progress Notes (Signed)
A and O x3. Report to RN. Tolerated MAC anesthesia well.Teeth unchanged after procedure. 

## 2021-04-28 NOTE — Op Note (Signed)
[facility] Patient Name: Karina Newman Procedure Date: 04/28/2021 8:28 AM MRN: [id number] Endoscopist: Remo Lipps P. Havery Moros , MD Age: 65 Referring MD:  Date of Birth: May 06, 1956 Gender: Female Account #: [id number] Procedure:                Upper GI endoscopy Indications:              Dyspepsia, Follow-up of atrophic / autoimmune                            gastritis, Weight loss - on twice daily omeprazole                            and carafate with persistent symptoms Medicines:                Monitored Anesthesia Care Procedure:                Pre-Anesthesia Assessment:                           - Prior to the procedure, a History and Physical                            was performed, and patient medications and                            allergies were reviewed. The patient's tolerance of                            previous anesthesia was also reviewed. The risks                            and benefits of the procedure and the sedation                            options and risks were discussed with the patient.                            All questions were answered, and informed consent                            was obtained. Prior Anticoagulants: The patient has                            taken no previous anticoagulant or antiplatelet                            agents. ASA Grade Assessment: II - A patient with                            mild systemic disease. After reviewing the risks                            and benefits, the patient was deemed in  satisfactory condition to undergo the procedure.                           After obtaining informed consent, the endoscope was                            passed under direct vision. Throughout the                            procedure, the patient's blood pressure, pulse, and                            oxygen saturations were monitored continuously. The                            GIF [id number]  [id number] was introduced through the                            mouth, and advanced to the second part of duodenum.                            The upper GI endoscopy was accomplished without                            difficulty. The patient tolerated the procedure                            well. Scope In: Scope Out: Findings:                 Esophagogastric landmarks were identified: the                            Z-line was found at 37 cm, the gastroesophageal                            junction was found at 37 cm and the upper extent of                            the gastric folds was found at 38 cm from the                            incisors.                           A sliding 1 cm hiatal hernia was present.                           The exam of the esophagus was otherwise normal.                           A single 6 mm subepithelial nodule was found in the  gastric antrum. Seemed hard to probing with                            forceps, not overtly a lipoma. Bite on bite                            biopsies were taken with a cold forceps for                            histology.                           Diffuse atrophic mucosa was found in the entire                            examined stomach. Mild focal erythema noted in the                            antrum.                           The exam of the stomach was otherwise normal.                           Biopsies were taken with a cold forceps in the                            gastric fundus, in the gastric body, at the                            incisura and in the gastric antrum for histology -                            rule out H pylori, gastric intestinal metplasia                            given history of atrophic gastritis.                           The duodenal bulb and second portion of the                            duodenum were normal. Complications:            No immediate complications.  Estimated blood loss:                            Minimal. Estimated Blood Loss:     Estimated blood loss was minimal. Impression:               - Esophagogastric landmarks identified.                           - 1 cm hiatal hernia.                           -  Normal esophagus otherwise                           - A single subepithelial nodule found in the                            antrum. Biopsied.                           - Gastric mucosal atrophy with focal erythema.                           - Normal stomach otherwise - biopses taken as above                           - Normal duodenal bulb and second portion of the                            duodenum. Recommendation:           - Patient has a contact number available for                            emergencies. The signs and symptoms of potential                            delayed complications were discussed with the                            patient. Return to normal activities tomorrow.                            Written discharge instructions were provided to the                            patient.                           - Resume previous diet.                           - Continue present medications.                           - Await pathology results with further                            recommendations. Consideration for RUQ [place] and or CT                            imaging pending course Carlota Raspberry. Ahlaya Ende, MD 04/28/2021 8:50:24 AM This report has been signed electronically.

## 2021-04-28 NOTE — Progress Notes (Signed)
History and Physical Interval Note: Patient seen on 8/31. She denies any changes since that visit, she denies any cardiopulmonary symptoms today, denies shortness of breath / chest pains. Ongoing dyspepsia, trial of BID PPI has not helped much and continues to have symptoms. I have discussed risks / benefits of endoscopy and anesthesia with her and she wishes to proceed. Further recommendations pending that result    04/28/2021 8:27 AM  Karina Newman  has presented today for endoscopic procedure(s), with the diagnosis of  Encounter Diagnoses  Name Primary?   Atrophic gastritis without hemorrhage Yes   Dyspepsia    Loss of weight   .  The various methods of evaluation and treatment have been discussed with the patient and/or family. After consideration of risks, benefits and other options for treatment, the patient has consented to  the endoscopic procedure(s).   The patient's history has been reviewed, patient examined, no change in status, stable for surgery.  I have reviewed the patient's chart and labs.  Questions were answered to the patient's satisfaction.    Jolly Mango, MD [facility]

## 2021-04-30 ENCOUNTER — Telehealth: Payer: Self-pay

## 2021-04-30 DIAGNOSIS — H3561 Retinal hemorrhage, right eye: Secondary | ICD-10-CM | POA: Diagnosis not present

## 2021-04-30 DIAGNOSIS — H2513 Age-related nuclear cataract, bilateral: Secondary | ICD-10-CM | POA: Diagnosis not present

## 2021-04-30 DIAGNOSIS — H16223 Keratoconjunctivitis sicca, not specified as Sjogren's, bilateral: Secondary | ICD-10-CM | POA: Diagnosis not present

## 2021-04-30 DIAGNOSIS — H43811 Vitreous degeneration, right eye: Secondary | ICD-10-CM | POA: Diagnosis not present

## 2021-04-30 NOTE — Telephone Encounter (Signed)
  Follow up Call-  Call back number 04/28/2021  Post procedure Call Back phone  # [phone]  Permission to leave phone message Yes  Some recent data might be hidden     Patient questions:  Do you have a fever, pain , or abdominal swelling? No. Pain Score  0 *  Have you tolerated food without any problems? Yes.    Have you been able to return to your normal activities? Yes.    Do you have any questions about your discharge instructions: Diet   No. Medications  No. Follow up visit  No.  Do you have questions or concerns about your Care? No.  Actions: * If pain score is 4 or above: No action needed, pain <4.   Have you developed a fever since your procedure? no  2.   Have you had an respiratory symptoms (SOB or cough) since your procedure? no  3.   Have you tested positive for COVID 19 since your procedure no  4.   Have you had any family members/close contacts diagnosed with the COVID 19 since your procedure?  no   If yes to any of these questions please route to Joylene John, RN and Joella Prince, RN

## 2021-05-01 NOTE — Telephone Encounter (Signed)
Please call patient to schedule for Visco knee injections.  Authorized for Synvisc series Bilateral knees. Buy and Bill. Deductible does not apply.  PA not required. Insurance to cover 100% of allowable administration cost with a $30.00 copay each visit, and 80% of the medication cost.

## 2021-05-02 ENCOUNTER — Other Ambulatory Visit: Payer: Self-pay | Admitting: Cardiovascular Disease

## 2021-05-04 ENCOUNTER — Other Ambulatory Visit: Payer: Self-pay

## 2021-05-04 DIAGNOSIS — R634 Abnormal weight loss: Secondary | ICD-10-CM

## 2021-05-04 DIAGNOSIS — K219 Gastro-esophageal reflux disease without esophagitis: Secondary | ICD-10-CM

## 2021-05-04 DIAGNOSIS — R1013 Epigastric pain: Secondary | ICD-10-CM

## 2021-05-04 DIAGNOSIS — K2940 Chronic atrophic gastritis without bleeding: Secondary | ICD-10-CM

## 2021-05-04 NOTE — Telephone Encounter (Signed)
Rx(s) sent to pharmacy electronically.  

## 2021-05-05 ENCOUNTER — Encounter: Payer: Self-pay | Admitting: Orthopaedic Surgery

## 2021-05-05 ENCOUNTER — Other Ambulatory Visit: Payer: Self-pay

## 2021-05-05 ENCOUNTER — Ambulatory Visit (INDEPENDENT_AMBULATORY_CARE_PROVIDER_SITE_OTHER): Payer: Medicare Other | Admitting: Orthopaedic Surgery

## 2021-05-05 ENCOUNTER — Other Ambulatory Visit: Payer: Self-pay | Admitting: Internal Medicine

## 2021-05-05 DIAGNOSIS — G8929 Other chronic pain: Secondary | ICD-10-CM | POA: Diagnosis not present

## 2021-05-05 DIAGNOSIS — M25511 Pain in right shoulder: Secondary | ICD-10-CM | POA: Diagnosis not present

## 2021-05-05 DIAGNOSIS — M5030 Other cervical disc degeneration, unspecified cervical region: Secondary | ICD-10-CM | POA: Diagnosis not present

## 2021-05-05 DIAGNOSIS — M25512 Pain in left shoulder: Secondary | ICD-10-CM

## 2021-05-05 NOTE — Progress Notes (Signed)
Office Visit Note   Patient: Karina Newman           Date of Birth: 04/09/1956           MRN: [id number] Visit Date: 05/05/2021              Requested by: Cassandria Anger, MD [place],  [place] [id number] PCP: Plotnikov, Evie Lacks, MD   Assessment & Plan: Visit Diagnoses:  1. Chronic pain of both shoulders   2. Chronic right shoulder pain   3. Degenerative cervical disc     Plan: Ms. Isaacs has been followed for the chronic issue with her cervical spine and right shoulder as previously outlined.  This all stemmed from an on-the-job injury while she was living and working in [place].  She relates having had prior evaluation for her cervical spine and even the thought of operating but she opted out because of the potential complications.  She has had a course of medicines and therapy and even epidural steroid injections.  Presently she is being followed by Dr. Coralie Keens primary care physician-with [facility].  She also has been followed for a problem chronic problem with her right shoulder and has had therapy in the past but its been, perhaps, over 10 years.  I performed an MRI scan that demonstrated some mild supra and infraspinatus tendinitis, mild subacromial bursitis and mild arthritis of the glenohumeral and acromioclavicular joints.  When she was last year I injected the subacromial space with cortisone and she notes that it helped "some" for a period of time.  She still having some issues with her right shoulder and neck at rest and even difficulty with her right shoulder when she raises her right arm over her head.  Her exam is relatively benign.  She believes that the issue with her neck and shoulder will be chronic.  She will continue take the medicines.  I think it is worth trying another course of physical therapy because some of it may be strain.  Return in 1 month  Follow-Up Instructions: Return in about 1 month (around 06/04/2021).   Orders:  Orders  Placed This Encounter  Procedures   Ambulatory referral to Physical Therapy   No orders of the defined types were placed in this encounter.     Procedures: No procedures performed   Clinical Data: No additional findings.   Subjective: Chief Complaint  Patient presents with   Right Shoulder - Pain, Follow-up  Patient presents today for a one month follow up on her neck and right shoulder. She received a right shoulder injection on 03/26/2021. She said that her neck is the same. She said that the injection helped for a few days, but activity makes her arm hurt more.  HPI  Review of Systems   Objective: Vital Signs: There were no vitals taken for this visit.  Physical Exam Constitutional:      Appearance: She is well-developed.  Eyes:     Pupils: Pupils are equal, round, and reactive to light.  Pulmonary:     Effort: Pulmonary effort is normal.  Skin:    General: Skin is warm and dry.  Neurological:     Mental Status: She is alert and oriented to person, place, and time.  Psychiatric:        Behavior: Behavior normal.    Ortho Exam some limitation of motion of the cervical spine.  Lacked about a fingerbreadth from touching her chin to her chest.  Had an about 70% of normal neck extension.  There was some pain in the posterior aspect of the cervical spine with motion.  Also some pain referred to the levator scapula muscle as well.  Almost normal rotation of the right to the left with no referred pain.  Painless range of motion of right shoulder with internal and external rotation.  Normal full overhead flexion.  No crepitation.  No significant tenderness of the acromioclavicular joint or even along the anterior or lateral subacromial space.  Biceps appears to be intact.  Good rhythm and release.  Specialty Comments:  No specialty comments available.  Imaging: No results found.   PMFS History: Patient Active Problem List   Diagnosis Date Noted   Toe pain, chronic,  right 09/17/2020   Grief at loss of child 07/24/2020   Dysphagia 01/02/2020   Globus sensation 01/02/2020   Thoracic back pain 12/19/2019   Sore throat 10/24/2019   Goiter 10/24/2019   Cough 10/24/2019   Neoplasm of uncertain behavior of skin 09/27/2019   Pain of right breast 06/03/2019   Fatty liver 01/23/2019   Hand pain 09/28/2018   Knee pain 09/28/2018   Inappropriate sinus tachycardia 05/17/2018   Apnea 05/17/2018   Upper respiratory infection 09/23/2017   Fatigue 09/12/2017   Well adult exam 01/19/2017   Abdominal pain 01/19/2017   Headache 10/27/2016   Pain in right shoulder 09/16/2016   Left hip pain 09/16/2016   Low vitamin B12 level 03/16/2016   Piriformis syndrome of right side 02/04/2016   IT band syndrome 02/04/2016   RUQ abdominal pain 02/04/2016   Low back pain radiating to right lower extremity 09/19/2015   Allergic rhinitis 09/19/2015   Atypical chest pain 04/07/2015   GERD (gastroesophageal reflux disease) 04/07/2015   Axillary adenitis 03/23/2015   Migraine headache 03/23/2015   Hives 01/29/2015   Edema 01/29/2015   Cervical disc disorder with radiculopathy of cervical region 12/11/2014   Insomnia 07/31/2014   Degenerative cervical disc 05/08/2014   Pain in joint, shoulder region 03/05/2014   Patellofemoral arthralgia of both knees 03/01/2014   Bilateral shoulder pain 10/18/2013   Sinusitis, bacterial 08/20/2013   URI, acute 08/14/2013   Hemoptysis 08/14/2013   Otitis media of left ear 08/14/2013   Labral tear of shoulder 07/03/2013   Hyperthyroidism 07/03/2013   HTN (hypertension), benign 07/03/2013   LVH (left ventricular hypertrophy) due to hypertensive disease 07/03/2013   Vitamin D deficiency 07/03/2013   Past Medical History:  Diagnosis Date   Abnormal cervical Pap smear with positive HPV DNA test 01/2014,01/2017   2015 Normal cytology with positive high-risk HPV 18/45. Colposcopy negative with negative ECC.  2018 normal cytology with  positive high-risk HPV 18/45   Anemia    Apnea 05/17/2018   Autoimmune gastritis    Fatty liver    Gastritis    Heart murmur    Hypertension    Hyperthyroidism    Inappropriate sinus tachycardia 05/17/2018   Internal hemorrhoids    Osteoarthritis    Right shoulder pain 2001   chronic pain   Thyroid disease    Hyperthyroid. Was on PTU (Dr Hampton Abbot in [place]) - stopped in 2013, stable   Tubular adenoma of colon     Family History  Problem Relation Age of Onset   Kidney disease Mother        ESRD   Hypertension Mother    Lung cancer Father    Hypertension Sister    Kidney disease Sister  Hypertension Sister    Diabetes Brother    Hypertension Brother    Hypertension Brother    Diabetes Paternal Grandmother    Sleep apnea Son    Hypertension Son    Colon cancer Neg Hx    Stomach cancer Neg Hx    Esophageal cancer Neg Hx    Rectal cancer Neg Hx    Pancreatic cancer Neg Hx     Past Surgical History:  Procedure Laterality Date   BREAST CYST ASPIRATION Left 2012   CESAREAN SECTION     x 4    COLONOSCOPY     HAND SURGERY Left    SHOULDER SURGERY Right 2002   SHOULDER SURGERY Left 2014   TUBAL LIGATION     UPPER GASTROINTESTINAL ENDOSCOPY     Social History   Occupational History   Occupation: RETIRED  Tobacco Use   Smoking status: Never   Smokeless tobacco: Never  Vaping Use   Vaping Use: Never used  Substance and Sexual Activity   Alcohol use: Yes    Comment: RARELY   Drug use: No   Sexual activity: Yes    Birth control/protection: Post-menopausal, Surgical    Comment: BTL-1st intercourse 16 yo-5 partners

## 2021-05-05 NOTE — Telephone Encounter (Signed)
I LMOM for patient to call to receive benefits, and schedule appointments.

## 2021-05-07 ENCOUNTER — Ambulatory Visit (HOSPITAL_COMMUNITY): Payer: Medicare Other | Attending: Cardiology

## 2021-05-07 ENCOUNTER — Telehealth: Payer: Self-pay | Admitting: Internal Medicine

## 2021-05-07 ENCOUNTER — Other Ambulatory Visit: Payer: Self-pay

## 2021-05-07 DIAGNOSIS — I119 Hypertensive heart disease without heart failure: Secondary | ICD-10-CM | POA: Diagnosis not present

## 2021-05-07 DIAGNOSIS — I10 Essential (primary) hypertension: Secondary | ICD-10-CM | POA: Diagnosis not present

## 2021-05-07 DIAGNOSIS — Z8489 Family history of other specified conditions: Secondary | ICD-10-CM

## 2021-05-07 MED ORDER — POTASSIUM CHLORIDE ER 10 MEQ PO TBCR: 10.0000 meq | EXTENDED_RELEASE_TABLET | Freq: Every day | ORAL | 3 refills | Status: DC

## 2021-05-07 NOTE — Addendum Note (Signed)
Addended by: Earnstine Regal on: 05/07/2021 04:53 PM   Modules accepted: Orders

## 2021-05-08 NOTE — Telephone Encounter (Signed)
Please re-send Potassium order to pharmacy, they have not received request. Patient requesting refill today

## 2021-05-09 LAB — ECHOCARDIOGRAM COMPLETE
Area-P 1/2: 3.99 cm2
S' Lateral: 3.3 cm

## 2021-05-10 ENCOUNTER — Other Ambulatory Visit: Payer: Self-pay | Admitting: Internal Medicine

## 2021-05-11 ENCOUNTER — Other Ambulatory Visit: Payer: Self-pay

## 2021-05-11 ENCOUNTER — Ambulatory Visit (HOSPITAL_COMMUNITY)
Admission: RE | Admit: 2021-05-11 | Discharge: 2021-05-11 | Disposition: A | Discharge status: Home / Self Care | Payer: Medicare Other | Source: Ambulatory Visit | Attending: Gastroenterology | Admitting: Gastroenterology

## 2021-05-11 DIAGNOSIS — E876 Hypokalemia: Secondary | ICD-10-CM

## 2021-05-11 DIAGNOSIS — K30 Functional dyspepsia: Secondary | ICD-10-CM | POA: Diagnosis not present

## 2021-05-11 DIAGNOSIS — K219 Gastro-esophageal reflux disease without esophagitis: Secondary | ICD-10-CM | POA: Insufficient documentation

## 2021-05-11 DIAGNOSIS — K2940 Chronic atrophic gastritis without bleeding: Secondary | ICD-10-CM | POA: Insufficient documentation

## 2021-05-11 DIAGNOSIS — R634 Abnormal weight loss: Secondary | ICD-10-CM | POA: Insufficient documentation

## 2021-05-11 DIAGNOSIS — K7689 Other specified diseases of liver: Secondary | ICD-10-CM | POA: Diagnosis not present

## 2021-05-11 DIAGNOSIS — R1013 Epigastric pain: Secondary | ICD-10-CM | POA: Diagnosis not present

## 2021-05-11 MED ORDER — POTASSIUM CHLORIDE ER 10 MEQ PO TBCR
10.0000 meq | EXTENDED_RELEASE_TABLET | Freq: Every day | ORAL | 3 refills | Status: DC
Start: 2021-05-11 — End: 2021-05-11

## 2021-05-11 MED ORDER — POTASSIUM CHLORIDE ER 10 MEQ PO TBCR: 10.0000 meq | EXTENDED_RELEASE_TABLET | Freq: Every day | ORAL | 3 refills | Status: DC

## 2021-05-11 MED ORDER — POTASSIUM CHLORIDE ER 10 MEQ PO TBCR
10.0000 meq | EXTENDED_RELEASE_TABLET | Freq: Every day | ORAL | 3 refills | Status: DC
Start: 2021-05-11 — End: 2021-05-12

## 2021-05-11 NOTE — Progress Notes (Signed)
Called and spoke with pt she states that the pharmacy did not receive the prescription for potassium chloride ( klor-con) 10. Prescription sent several times. Please Advise, pt needs new rx from potassium chloride.  Patient would like to have her dog as an emotional support animal since the passing of her son. She would like to know the process to adding her dog as a emotional support pet.  ----------------------------------------------- Prescription renewed. Okay w/me to have her dog for motional support. Thx

## 2021-05-11 NOTE — Telephone Encounter (Signed)
Called and spoke with pt. Tried to reorder medication,but it did not go through to pharmacy. Routed to provider.

## 2021-05-11 NOTE — Addendum Note (Signed)
Addended by: Earnstine Regal on: 05/11/2021 10:30 AM   Modules accepted: Orders

## 2021-05-12 MED ORDER — POTASSIUM CHLORIDE ER 10 MEQ PO TBCR: 10.0000 meq | EXTENDED_RELEASE_TABLET | Freq: Every day | ORAL | 3 refills | Status: DC

## 2021-05-12 NOTE — Progress Notes (Signed)
Generated letter for emotional suuport dog. Notified pt letter is ready for pick up. She will need to contact [employer] @  [phone] or Email [place] [email or web address] them to get registered.Marland KitchenJohny Chess

## 2021-06-18 ENCOUNTER — Other Ambulatory Visit: Payer: Self-pay

## 2021-06-18 ENCOUNTER — Encounter: Payer: Self-pay | Admitting: Internal Medicine

## 2021-06-18 ENCOUNTER — Ambulatory Visit (INDEPENDENT_AMBULATORY_CARE_PROVIDER_SITE_OTHER): Payer: Medicare Other | Admitting: Internal Medicine

## 2021-06-18 DIAGNOSIS — F4321 Adjustment disorder with depressed mood: Secondary | ICD-10-CM | POA: Diagnosis not present

## 2021-06-18 DIAGNOSIS — E538 Deficiency of other specified B group vitamins: Secondary | ICD-10-CM | POA: Diagnosis not present

## 2021-06-18 DIAGNOSIS — Z634 Disappearance and death of family member: Secondary | ICD-10-CM

## 2021-06-18 DIAGNOSIS — I119 Hypertensive heart disease without heart failure: Secondary | ICD-10-CM | POA: Diagnosis not present

## 2021-06-18 DIAGNOSIS — I10 Essential (primary) hypertension: Secondary | ICD-10-CM | POA: Diagnosis not present

## 2021-06-18 DIAGNOSIS — S43431S Superior glenoid labrum lesion of right shoulder, sequela: Secondary | ICD-10-CM | POA: Diagnosis not present

## 2021-06-18 DIAGNOSIS — E559 Vitamin D deficiency, unspecified: Secondary | ICD-10-CM | POA: Diagnosis not present

## 2021-06-18 MED ORDER — DICLOFENAC SODIUM 1.5 % EX SOLN: CUTANEOUS | 5 refills | Status: DC

## 2021-06-18 MED ORDER — OXYCODONE-ACETAMINOPHEN 5-325 MG PO TABS: 1.0000 | ORAL_TABLET | Freq: Two times a day (BID) | ORAL | 0 refills | Status: DC | PRN

## 2021-06-18 NOTE — Assessment & Plan Note (Signed)
1 year. Very sad.Karina KitchenMarland Newman

## 2021-06-18 NOTE — Assessment & Plan Note (Signed)
On Vit D 

## 2021-06-18 NOTE — Assessment & Plan Note (Signed)
Cont on current therapy with Percocet - prn -- rare  Potential benefits of a long term opioids use as well as potential risks (i.e. addiction risk, apnea etc) and complications (i.e. Somnolence, constipation and others) were explained to the patient and were aknowledged. [place] prn

## 2021-06-18 NOTE — Assessment & Plan Note (Signed)
Cont w/Amlodipine, Micardis HCT 

## 2021-06-18 NOTE — Assessment & Plan Note (Signed)
On B12 

## 2021-06-18 NOTE — Assessment & Plan Note (Signed)
2022 ECHO: Left ventricular ejection fraction, by estimation, is 50 to 55%. The  left ventricle has low normal function. No med change

## 2021-06-18 NOTE — Progress Notes (Signed)
Subjective:  Patient ID: Karina Newman, female    DOB: 01-26-1956  Age: 65 y.o. MRN: [id number]  CC: Follow-up (3 month f/u)   HPI Karmella Bouvier presents for chronic pain, HTN, grief, depression f/u. R shoullder w/pain  Outpatient Medications Prior to Visit  Medication Sig Dispense Refill   amLODipine (NORVASC) 5 MG tablet TAKE 1 TABLET (5 MG TOTAL) BY MOUTH DAILY. 90 tablet 3   aspirin EC 81 MG tablet Take 1 tablet (81 mg total) by mouth daily. 100 tablet 3   Cyanocobalamin (VITAMIN B-12) 500 MCG SUBL 1 sl qd 100 tablet 5   cyclobenzaprine (FLEXERIL) 5 MG tablet TAKE 1 TABLET BY MOUTH THREE TIMES A DAY AS NEEDED FOR MUSCLE SPASMS 60 tablet 2   dicyclomine (BENTYL) 10 MG capsule Take 1 capsule (10 mg total) by mouth every 8 (eight) hours as needed for spasms. 270 capsule 3   doxazosin (CARDURA) 4 MG tablet TAKE 1 TABLET BY MOUTH EVERY DAY 90 tablet 2   fluticasone (FLONASE) 50 MCG/ACT nasal spray Place 1 spray into both nostrils as needed. 48 mL 3   gabapentin (NEURONTIN) 100 MG capsule TAKE 1 CAPSULE BY MOUTH THREE TIMES A DAY AS NEEDED 90 capsule 3   LORazepam (ATIVAN) 0.5 MG tablet Take 1 tablet (0.5 mg total) by mouth 2 (two) times daily as needed for anxiety or sleep. 60 tablet 1   omeprazole (PRILOSEC) 40 MG capsule TAKE 1 CAPSULE (40 MG TOTAL) BY MOUTH IN THE MORNING AND AT BEDTIME. 180 capsule 1   polyethylene glycol (MIRALAX) 17 g packet Take daily as directed. Increase or decrease as needed 14 each 0   potassium chloride (KLOR-CON) 10 MEQ tablet Take 1 tablet (10 mEq total) by mouth daily. Take 1 tablet (10 mEq total) by mouth daily. 90 tablet 3   sucralfate (CARAFATE) 1 g tablet TAKE 1 TABLET BY MOUTH 3 TIMES A DAY BETWEEN MEALS 270 tablet 3   telmisartan-hydrochlorothiazide (MICARDIS HCT) 80-25 MG tablet TAKE 1 TABLET BY MOUTH EVERY DAY 90 tablet 1   traZODone (DESYREL) 50 MG tablet TAKE 1 TABLET BY MOUTH EVERYDAY AT BEDTIME 90 tablet 3   valACYclovir (VALTREX) 500 MG tablet  Take one tablet po BID x 3 days prn outbreak 30 tablet 1   Vitamin D, Cholecalciferol, 1000 units CAPS Take 2,000 Units by mouth.     vortioxetine HBr (TRINTELLIX) 5 MG TABS tablet Take 1 tablet (5 mg total) by mouth daily. 30 tablet 5   Diclofenac Sodium 1.5 % SOLN USE 15 DROPS THREE TIMES DAILY EXTERNALLY FOR PAIN 150 mL 5   oxyCODONE-acetaminophen (PERCOCET/ROXICET) 5-325 MG tablet Take 1 tablet by mouth 2 (two) times daily as needed for severe pain. 60 tablet 0   carvedilol (COREG) 12.5 MG tablet Take 1 tablet (12.5 mg total) by mouth 2 (two) times daily. 180 tablet 3   No facility-administered medications prior to visit.    ROS: Review of Systems  Constitutional:  Positive for fatigue. Negative for activity change, appetite change, chills and unexpected weight change.  HENT:  Negative for congestion, mouth sores and sinus pressure.   Eyes:  Negative for visual disturbance.  Respiratory:  Negative for cough and chest tightness.   Gastrointestinal:  Negative for abdominal pain and nausea.  Genitourinary:  Negative for difficulty urinating, frequency and vaginal pain.  Musculoskeletal:  Positive for arthralgias and neck pain. Negative for back pain and gait problem.  Skin:  Negative for pallor and rash.  Neurological:  Negative  for dizziness, tremors, weakness, numbness and headaches.  Psychiatric/Behavioral:  Negative for confusion, sleep disturbance and suicidal ideas. The patient is nervous/anxious.    Objective:  BP 132/82 (BP Location: Left Arm)   Pulse 81   Temp 98.9 F (37.2 C) (Oral)   Ht 5\' 3"  (1.6 m)   Wt 162 lb (73.5 kg)   SpO2 97%   BMI 28.70 kg/m   BP Readings from Last 3 Encounters:  06/18/21 132/82  04/28/21 121/74  04/22/21 115/75    Wt Readings from Last 3 Encounters:  06/18/21 162 lb (73.5 kg)  04/28/21 165 lb (74.8 kg)  04/22/21 167 lb 9.6 oz (76 kg)    Physical Exam Constitutional:      General: She is not in acute distress.    Appearance: She is  well-developed.  HENT:     Head: Normocephalic.     Right Ear: External ear normal.     Left Ear: External ear normal.     Nose: Nose normal.  Eyes:     General:        Right eye: No discharge.        Left eye: No discharge.     Conjunctiva/sclera: Conjunctivae normal.     Pupils: Pupils are equal, round, and reactive to light.  Neck:     Thyroid: No thyromegaly.     Vascular: No JVD.     Trachea: No tracheal deviation.  Cardiovascular:     Rate and Rhythm: Normal rate and regular rhythm.     Heart sounds: Normal heart sounds.  Pulmonary:     Effort: No respiratory distress.     Breath sounds: No stridor. No wheezing.  Abdominal:     General: Bowel sounds are normal. There is no distension.     Palpations: Abdomen is soft. There is no mass.     Tenderness: There is no abdominal tenderness. There is no guarding or rebound.  Musculoskeletal:        General: Tenderness present.     Cervical back: Normal range of motion and neck supple. No rigidity.  Lymphadenopathy:     Cervical: No cervical adenopathy.  Skin:    Findings: No erythema or rash.  Neurological:     Cranial Nerves: No cranial nerve deficit.     Motor: No abnormal muscle tone.     Coordination: Coordination normal.     Deep Tendon Reflexes: Reflexes normal.  Psychiatric:        Behavior: Behavior normal.        Thought Content: Thought content normal.        Judgment: Judgment normal.   R shoullder w/pain Appears sad  Lab Results  Component Value Date   WBC 5.0 12/04/2020   HGB 13.0 12/04/2020   HCT 38.2 12/04/2020   PLT 307.0 12/04/2020   GLUCOSE 103 (H) 12/04/2020   CHOL 161 12/04/2020   TRIG 101.0 12/04/2020   HDL 40.20 12/04/2020   LDLCALC 101 (H) 12/04/2020   ALT 24 12/04/2020   AST 19 12/04/2020   NA 136 12/04/2020   K 3.1 (L) 12/04/2020   CL 98 12/04/2020   CREATININE 0.80 12/04/2020   BUN 14 12/04/2020   CO2 31 12/04/2020   TSH 0.44 12/04/2020    US Abdomen Limited RUQ  (LIVER/GB)  Result Date: 05/11/2021 CLINICAL DATA:  Dyspepsia EXAM: ULTRASOUND ABDOMEN LIMITED RIGHT UPPER QUADRANT COMPARISON:  CT 09/09/2016 FINDINGS: Gallbladder: No gallstones or wall thickening visualized. No sonographic Murphy sign noted by sonographer.  Common bile duct: Diameter: 5 mm Liver: Diffusely echogenic liver. Focal hypoechoic region near gallbladder fossa likely reflects focal fat sparing. Portal vein is patent on color Doppler imaging with normal direction of blood flow towards the liver. Other: None. IMPRESSION: 1. Negative for gallstones. 2. Echogenic liver consistent with hepatic steatosis. Probable focal fat sparing near gallbladder fossa Electronically Signed   By: Donavan Foil M.D.   On: 05/11/2021 21:42    Assessment & Plan:   Problem List Items Addressed This Visit     Grief at loss of child     1 year. Very sad...      HTN (hypertension), benign    Cont w/Amlodipine, Micardis HCT      Labral tear of shoulder     Cont on current therapy with Percocet - prn -- rare  Potential benefits of a long term opioids use as well as potential risks (i.e. addiction risk, apnea etc) and complications (i.e. Somnolence, constipation and others) were explained to the patient and were aknowledged. [place] prn      Low vitamin B12 level    On B12      LVH (left ventricular hypertrophy) due to hypertensive disease    2022 ECHO: Left ventricular ejection fraction, by estimation, is 50 to 55%. The  left ventricle has low normal function. No med change      Vitamin D deficiency    On Vit D         Meds ordered this encounter  Medications   Diclofenac Sodium 1.5 % SOLN    Sig: USE 15 DROPS THREE TIMES DAILY EXTERNALLY FOR PAIN    Dispense:  150 mL    Refill:  5   oxyCODONE-acetaminophen (PERCOCET/ROXICET) 5-325 MG tablet    Sig: Take 1 tablet by mouth 2 (two) times daily as needed for severe pain.    Dispense:  60 tablet    Refill:  0    Please fill on or after  03/18/21       Follow-up: Return in about 3 months (around 09/18/2021) for Wellness Exam.  Walker Kehr, MD

## 2021-06-19 ENCOUNTER — Ambulatory Visit: Payer: Medicare Other | Admitting: Physical Therapy

## 2021-06-21 ENCOUNTER — Other Ambulatory Visit: Payer: Self-pay | Admitting: Gastroenterology

## 2021-06-22 ENCOUNTER — Ambulatory Visit: Payer: Medicare Other | Admitting: Gastroenterology

## 2021-06-22 ENCOUNTER — Encounter: Payer: Self-pay | Admitting: Gastroenterology

## 2021-06-22 VITALS — BP 140/80 | HR 88 | Ht 64.0 in | Wt 167.0 lb

## 2021-06-22 DIAGNOSIS — R1013 Epigastric pain: Secondary | ICD-10-CM | POA: Diagnosis not present

## 2021-06-22 DIAGNOSIS — K3189 Other diseases of stomach and duodenum: Secondary | ICD-10-CM | POA: Diagnosis not present

## 2021-06-22 DIAGNOSIS — K760 Fatty (change of) liver, not elsewhere classified: Secondary | ICD-10-CM | POA: Diagnosis not present

## 2021-06-22 DIAGNOSIS — K2940 Chronic atrophic gastritis without bleeding: Secondary | ICD-10-CM

## 2021-06-22 DIAGNOSIS — K219 Gastro-esophageal reflux disease without esophagitis: Secondary | ICD-10-CM | POA: Diagnosis not present

## 2021-06-22 DIAGNOSIS — G8929 Other chronic pain: Secondary | ICD-10-CM

## 2021-06-22 NOTE — Progress Notes (Signed)
HPI :  65 year old female here for a follow-up visit for autoimmune gastritis, abdominal pain.  Recall she has a history of autoimmune gastritis on EGD and biopsies as well as a positive parietal cell antibody, history of B12 deficiency.  Since have last seen her she had an EGD with me in September which showed diffuse atrophic gastritis.  Biopsies were consistent with autoimmune gastritis but no gastric intestinal metaplasia or dysplasia.  H. pylori negative.  She did have a 6 mm subepithelial nodule noted in the gastric antrum, but not but biopsies were nondiagnostic unfortunately.  She continues to have multiple upper tract symptoms that bother her.  Her main symptom is upper/epigastric discomfort that is bothersome to her most of the time.  At prior visit she said symptoms can be worse with eating, and sometimes better with eating.  She states she does not typically feel worse after she eats anymore, and if anything if she does not eat things can be worse.  She states most the time however she has a discomfort/cramping in her upper abdomen area that occasionally can go away.  She denies reliable postprandial symptoms.  She does have some early satiety but no nausea, no vomiting.  No heartburn or dysphagia.  Weight seems to be stable.  She has been on omeprazole 40 mg twice daily, Carafate few times daily and Bentyl as needed.  She does think the Carafate and Bentyl definitely help her when she takes it.  I had given her some FD guard to help treat component of dyspepsia.  She states she thought that may have helped settle her stomach a little bit but it makes her nauseated and overall does not like taking it. We obtained a right upper quadrant ultrasound which showed no evidence of gallstones.  Incidentally noted she had some fat in her liver.  Her liver enzymes have been normal.  She does not really drink much of any alcohol.  Denies any weight gain.  She is not using any NSAIDs.  Generally her  symptoms are about the same, main complaint is ongoing discomfort in her epigastric area more than anything.  Her last cross-sectional imaging was almost 5 years ago.  She has had an extensive work-up in the past to include the following:  Workup as follows: CT scan abdomen for RLQ pain - 09/09/16, oral contrast only - hepatic steatosis, small inguinal hernias, small umbilical hernia EGD 3/84/66 - normal EGD, biopsies taken for H pylori - showing "marked chronic gastritis" , negative for HP Colonoscopy 05/06/16 - normal colon and ileum, small adenoma, hemorrhoids. Recall colonoscopy in 2024. [place] 03/04/16 - no gallstones, fatty liver EGD 05/2018 showed 1 cm hiatal hernia and mildly atrophic gastritis; gastric biopsies c/w autoimmune gastritis.  Repeat EGD recommended in 3-5 years.   Labs show negative IF AB, positive parietal cell AB, and B12 deficiency. Diagnosed with autoimmune gastritis.   EGD 04/28/21: - A sliding 1 cm hiatal hernia was present. - The exam of the esophagus was otherwise normal. - A single 6 mm subepithelial nodule was found in the gastric antrum. Seemed hard to probing with forceps, not overtly a lipoma. Bite on bite biopsies were taken with a cold forceps for histology. - Diffuse atrophic mucosa was found in the entire examined stomach. Mild focal erythema noted in the antrum. - The exam of the stomach was otherwise normal. - Biopsies were taken with a cold forceps in the gastric fundus, in the gastric body, at the incisura and in  the gastric antrum for histology - rule out H pylori, gastric intestinal metplasia given history of atrophic gastritis. - The duodenal bulb and second portion of the duodenum were normal.  1. Surgical [P], gastric antrum - GASTRIC ANTRAL MUCOSA WITH MILD CHRONIC GASTRITIS - NEGATIVE FOR INTESTINAL METAPLASIA OR DYSPLASIA - WARTHIN STARRY STAIN IS NEGATIVE FOR HELICOBACTER PYLORI 2. Surgical [P], gastric body - GASTRIC ANTRAL TYPE MUCOSA  SHOWING PATCHY CHRONIC GASTRITIS WITH ATROPHY AND ECL CELL HYPERPLASIA, CONSISTENT WITH PATIENT'S HISTORY OF AUTOIMMUNE GASTRITIS - NEGATIVE FOR INTESTINAL METAPLASIA OR DYSPLASIA - WARTHIN-STARRY STAIN IS NEGATIVE FOR HELICOBACTER PYLORI 3. Surgical [P], fundus - GASTRIC ANTRAL TYPE MUCOSA SHOWING PATCHY CHRONIC GASTRITIS WITH ATROPHY AND ECL CELL HYPERPLASIA, CONSISTENT WITH PATIENT'S HISTORY OF AUTOIMMUNE GASTRITIS - NEGATIVE FOR INTESTINAL METAPLASIA OR DYSPLASIA - WARTHIN-STARRY STAIN IS NEGATIVE FOR HELICOBACTER PYLORI 4. Surgical [P], gastric antrum - GASTRIC ANTRAL MUCOSA WITH MILD CHRONIC GASTRITIS - NEGATIVE FOR INTESTINAL METAPLASIA OR DYSPLASIA - WARTHIN STARRY STAIN IS NEGATIVE FOR HELICOBACTER PYLORI   [place] RUQ 05/11/21 - IMPRESSION: 1. Negative for gallstones. 2. Echogenic liver consistent with hepatic steatosis. Probable focal fat sparing near gallbladder fossa    Past Medical History:  Diagnosis Date   Abnormal cervical Pap smear with positive HPV DNA test 01/2014,01/2017   2015 Normal cytology with positive high-risk HPV 18/45. Colposcopy negative with negative ECC.  2018 normal cytology with positive high-risk HPV 18/45   Anemia    Apnea 05/17/2018   Autoimmune gastritis    Fatty liver    Gastritis    Heart murmur    Hypertension    Hyperthyroidism    Inappropriate sinus tachycardia 05/17/2018   Internal hemorrhoids    Osteoarthritis    Right shoulder pain 2001   chronic pain   Thyroid disease    Hyperthyroid. Was on PTU (Dr Hampton Abbot in [place]) - stopped in 2013, stable   Tubular adenoma of colon      Past Surgical History:  Procedure Laterality Date   BREAST CYST ASPIRATION Left 2012   CESAREAN SECTION     x 4    COLONOSCOPY     HAND SURGERY Left    SHOULDER SURGERY Right 2002   SHOULDER SURGERY Left 2014   TUBAL LIGATION     UPPER GASTROINTESTINAL ENDOSCOPY     Family History  Problem Relation Age of Onset   Kidney disease Mother        ESRD    Hypertension Mother    Lung cancer Father    Hypertension Sister    Kidney disease Sister    Hypertension Sister    Diabetes Brother    Hypertension Brother    Hypertension Brother    Diabetes Paternal Grandmother    Sleep apnea Son    Hypertension Son    Colon cancer Neg Hx    Stomach cancer Neg Hx    Esophageal cancer Neg Hx    Rectal cancer Neg Hx    Pancreatic cancer Neg Hx    Social History   Tobacco Use   Smoking status: Never   Smokeless tobacco: Never  Vaping Use   Vaping Use: Never used  Substance Use Topics   Alcohol use: Yes    Comment: RARELY   Drug use: No   Current Outpatient Medications  Medication Sig Dispense Refill   amLODipine (NORVASC) 5 MG tablet TAKE 1 TABLET (5 MG TOTAL) BY MOUTH DAILY. 90 tablet 3   aspirin EC 81 MG tablet Take 1 tablet (81 mg  total) by mouth daily. 100 tablet 3   Cyanocobalamin (VITAMIN B-12) 500 MCG SUBL 1 sl qd 100 tablet 5   cyclobenzaprine (FLEXERIL) 5 MG tablet TAKE 1 TABLET BY MOUTH THREE TIMES A DAY AS NEEDED FOR MUSCLE SPASMS 60 tablet 2   Diclofenac Sodium 1.5 % SOLN USE 15 DROPS THREE TIMES DAILY EXTERNALLY FOR PAIN 150 mL 5   dicyclomine (BENTYL) 10 MG capsule Take 1 capsule (10 mg total) by mouth every 8 (eight) hours as needed for spasms. 270 capsule 3   doxazosin (CARDURA) 4 MG tablet TAKE 1 TABLET BY MOUTH EVERY DAY 90 tablet 2   fluticasone (FLONASE) 50 MCG/ACT nasal spray Place 1 spray into both nostrils as needed. 48 mL 3   gabapentin (NEURONTIN) 100 MG capsule TAKE 1 CAPSULE BY MOUTH THREE TIMES A DAY AS NEEDED 90 capsule 3   LORazepam (ATIVAN) 0.5 MG tablet Take 1 tablet (0.5 mg total) by mouth 2 (two) times daily as needed for anxiety or sleep. 60 tablet 1   omeprazole (PRILOSEC) 40 MG capsule TAKE 1 CAPSULE (40 MG TOTAL) BY MOUTH IN THE MORNING AND AT BEDTIME. 180 capsule 1   oxyCODONE-acetaminophen (PERCOCET/ROXICET) 5-325 MG tablet Take 1 tablet by mouth 2 (two) times daily as needed for severe pain. 60 tablet  0   polyethylene glycol (MIRALAX) 17 g packet Take daily as directed. Increase or decrease as needed 14 each 0   potassium chloride (KLOR-CON) 10 MEQ tablet Take 1 tablet (10 mEq total) by mouth daily. Take 1 tablet (10 mEq total) by mouth daily. 90 tablet 3   sucralfate (CARAFATE) 1 g tablet TAKE 1 TABLET BY MOUTH 3 TIMES A DAY BETWEEN MEALS 270 tablet 3   telmisartan-hydrochlorothiazide (MICARDIS HCT) 80-25 MG tablet TAKE 1 TABLET BY MOUTH EVERY DAY 90 tablet 1   traZODone (DESYREL) 50 MG tablet TAKE 1 TABLET BY MOUTH EVERYDAY AT BEDTIME 90 tablet 3   valACYclovir (VALTREX) 500 MG tablet Take one tablet po BID x 3 days prn outbreak 30 tablet 1   Vitamin D, Cholecalciferol, 1000 units CAPS Take 2,000 Units by mouth.     vortioxetine HBr (TRINTELLIX) 5 MG TABS tablet Take 1 tablet (5 mg total) by mouth daily. 30 tablet 5   carvedilol (COREG) 12.5 MG tablet Take 1 tablet (12.5 mg total) by mouth 2 (two) times daily. 180 tablet 3   No current facility-administered medications for this visit.   Allergies  Allergen Reactions   Penicillins Hives   Tramadol Anaphylaxis    Memory lapses Memory lapses   Gabapentin     Nausea    Iodine Other (See Comments)    Due to hyperthyroidism   Lexapro [Escitalopram]     fatigue   Lyrica [Pregabalin] Dermatitis   Nsaids     gastritis   Voltaren [Diclofenac] Hives     Review of Systems: All systems reviewed and negative except where noted in HPI.    Lab Results  Component Value Date   WBC 5.0 12/04/2020   HGB 13.0 12/04/2020   HCT 38.2 12/04/2020   MCV 95.2 12/04/2020   PLT 307.0 12/04/2020    Lab Results  Component Value Date   CREATININE 0.80 12/04/2020   BUN 14 12/04/2020   NA 136 12/04/2020   K 3.1 (L) 12/04/2020   CL 98 12/04/2020   CO2 31 12/04/2020    Lab Results  Component Value Date   ALT 24 12/04/2020   AST 19 12/04/2020   ALKPHOS 71  12/04/2020   BILITOT 0.6 12/04/2020     Physical Exam: BP 140/80   Pulse 88    Ht 5\' 4"  (1.626 m)   Wt 167 lb (75.8 kg)   BMI 28.67 kg/m  Constitutional: Pleasant,well-developed, female in no acute distress. Abdominal: Soft, nondistended, epigastric TTP.  There are no masses palpable.  Extremities: no edema Neurological: Alert and oriented to person place and time. Skin: Skin is warm and dry. No rashes noted. Psychiatric: Normal mood and affect. Behavior is normal.   ASSESSMENT AND PLAN: 65 year old female here for reassessment of following:  Autoimmune / atrophic gastritis Gastric subepithelial nodule Abdominal pain GERD Fatty liver  As above, autoimmune gastritis noted without any intestinal metaplasia/dysplasia/H. pylori on the last exam.  She is on PPI and Carafate, and states these help her upper abdominal symptoms but clearly have not resolved them and she continues to have persistent discomfort.  Does not seem to be postprandial as it did the last time I saw her, appears to fluctuate in regards to severity frequency.  Her ultrasound shows no gallstones, biliary colic seems unlikely.  Given her persistent symptoms despite a variety of regimens recommending a CT scan abdomen pelvis with oral contrast only to further evaluate.  She reports intolerance to iodine/IV contrast due to thyroid disease in the past and was told to never have IV contrast.  She should clarify this with her primary care or endocrinologist that she has not spoken with physician about this in a long time.  Otherwise unclear how much of a component of musculoskeletal pain can be with this as well, will await CT scan first.  She incidentally had a small gastric subepithelial nodule.  In light of her autoimmune gastritis she is at higher risk for carcinoid syndrome, but bite on bite biopsies were nondiagnostic unfortunately.  This is a small, less than 1 cm lesion, could survey this in 1 year with EGD versus consideration for EUS.  I may discuss this case with my advanced endoscopy colleagues  regarding possible EUS given her autoimmune gastritis and higher risk for carcinoid.  Otherwise the FD guard seems to be causing nausea so we will stop that.  If the CT scan is negative I may consider adding buspirone versus TCA, discussed these briefly with her today.  Otherwise we discussed her fatty liver which is not new, LFTs normal, weight has been stable.  We will check LFTs yearly, counseled her on this.  Plan: - CT scan abdomen / pelvis with oral contrast only due to iodine allergy - counseled on fatty liver - stop FD gard - causing nausea - continue omeprazole / carafate / bentyl PRN - consider buspar or TCA but await CT first - will discuss her case with my advanced endoscopy colleagues in light of autoimmune gastritis and gastric nodule - surveillance EGD vs. EUS  Jolly Mango, MD [facility]

## 2021-06-22 NOTE — Patient Instructions (Signed)
If you are age 64 or older, your body mass index should be between 23-30. Your Body mass index is 28.67 kg/m. If this is out of the aforementioned range listed, please consider follow up with your Primary Care Provider.  If you are age 23 or younger, your body mass index should be between 19-25. Your Body mass index is 28.67 kg/m. If this is out of the aformentioned range listed, please consider follow up with your Primary Care Provider.   ________________________________________________________  The [facility] providers would like to encourage you to use [facility] to communicate with providers for non-urgent requests or questions.  Due to long hold times on the telephone, sending your provider a message by [facility] may be a faster and more efficient way to get a response.  Please allow 48 business hours for a response.  Please remember that this is for non-urgent requests.  _______________________________________________________  Stop the FD-guard!!   You have been scheduled for a CT scan of the abdomen and pelvis at [facility] ([address]---this is in the same building as [facility]).   You are scheduled on 07-01-2021 at [facility] should arrive 15 minutes prior to your appointment time for registration. Please follow the written instructions below on the day of your exam:  WARNING: IF YOU ARE ALLERGIC TO IODINE/X-RAY DYE, PLEASE NOTIFY RADIOLOGY IMMEDIATELY AT [phone]! YOU WILL BE GIVEN A 13 HOUR PREMEDICATION PREP.  1) Do not eat or drink anything after 5am (4 hours prior to your test) 2) You have been given 2 bottles of oral contrast to drink. The solution may taste better if refrigerated, but do NOT add ice or any other liquid to this solution. Shake well before drinking.    Drink 1 bottle of contrast @ 7am (2 hours prior to your exam)  Drink 1 bottle of contrast @ 8am (1 hour prior to your exam)  You may take any medications as prescribed  with a small amount of water, if necessary. If you take any of the following medications: METFORMIN, GLUCOPHAGE, GLUCOVANCE, AVANDAMET, RIOMET, FORTAMET, [place] MET, JANUMET, GLUMETZA or METAGLIP, you MAY be asked to HOLD this medication 48 hours AFTER the exam.  The purpose of you drinking the oral contrast is to aid in the visualization of your intestinal tract. The contrast solution may cause some diarrhea. Depending on your individual set of symptoms, you may also receive an intravenous injection of x-ray contrast/dye. Plan on being at [facility] for 30 minutes or longer, depending on the type of exam you are having performed.  This test typically takes 30-45 minutes to complete.  If you have any questions regarding your exam or if you need to reschedule, you may call the CT department at [phone] between the hours of 8:00 am and 5:00 pm, Monday-Friday.  ________________________________________________________________________   It was a pleasure to see you today!  Thank you for trusting me with your gastrointestinal care!

## 2021-06-23 ENCOUNTER — Ambulatory Visit (INDEPENDENT_AMBULATORY_CARE_PROVIDER_SITE_OTHER): Admitting: Physician Assistant

## 2021-06-23 ENCOUNTER — Other Ambulatory Visit: Payer: Self-pay

## 2021-06-23 ENCOUNTER — Encounter: Payer: Self-pay | Admitting: Physician Assistant

## 2021-06-23 ENCOUNTER — Telehealth: Payer: Self-pay | Admitting: Gastroenterology

## 2021-06-23 DIAGNOSIS — M25511 Pain in right shoulder: Secondary | ICD-10-CM

## 2021-06-23 DIAGNOSIS — M25512 Pain in left shoulder: Secondary | ICD-10-CM

## 2021-06-23 DIAGNOSIS — G8929 Other chronic pain: Secondary | ICD-10-CM

## 2021-06-23 NOTE — Progress Notes (Signed)
Office Visit Note   Patient: Karina Newman           Date of Birth: [birth year]           MRN: [id number] Visit Date: 06/23/2021              Requested by: Cassandria Anger, MD [place],  [place] [address] PCP: Plotnikov, Evie Lacks, MD  Chief Complaint  Patient presents with   Right Shoulder - Follow-up   Left Shoulder - Follow-up      HPI: Patient is a pleasant 65 year old woman who is 7 weeks follow-up for bilateral right greater than left shoulder pain and neck pain.  This is a longstanding work-related injury to when she was living in [place] several years ago.  She has had surgery in her shoulders which did not help much.  She is also had injections.  She is on chronic pain management.  At her last visit it was recommended that she try to work with physical therapy and she would like to do this.  She states that she has not been called to begin therapy yet.  She has about the same  Assessment & Plan: Visit Diagnoses: No diagnosis found.  Plan: Chronic right greater than left shoulder pain and neck pain.  The neck pain does not seem to be as much of an issue as her shoulder pain and limitations.  I do think she needs to go to physical therapy she understands that I do not expect a complete recovery however hopefully we can get her a bit more functional.  She is very willing to do this.  We will follow-up with the referral through her work comp and see what this gets done.  Would like to see her back in another month after completing physical therapy or at least a good amount  Follow-Up Instructions: No follow-ups on file.   Ortho Exam  Patient is alert, oriented, no adenopathy, well-dressed, normal affect, normal respiratory effort. Bilateral shoulders she is right-hand dominant though there is just using her right arm a lot.  She has bilateral forward elevation to about 160 degrees.  She has very limited internal rotation behind the back bilaterally.  She does  have impingement findings on the right greater than left distal grip strength is intact distal pulses are intact.  She has pain and weakness with resisted abduction more on the right  Imaging: No results found. No images are attached to the encounter.  Labs: Lab Results  Component Value Date   ESRSEDRATE 22 09/12/2017   LABURIC 5.3 10/24/2018     Lab Results  Component Value Date   ALBUMIN 4.1 12/04/2020   ALBUMIN 4.5 07/26/2019   ALBUMIN 4.3 04/18/2018    No results found for: MG Lab Results  Component Value Date   VD25OH 76.73 12/04/2020   VD25OH 49.39 09/12/2017   VD25OH 21.13 (L) 10/16/2015    No results found for: PREALBUMIN CBC EXTENDED Latest Ref Rng & Units 12/04/2020 07/26/2019 04/18/2018  WBC 4.0 - 10.5 K/uL 5.0 6.2 4.9  RBC 3.87 - 5.11 Mil/uL 4.02 4.30 4.17  HGB 12.0 - 15.0 g/dL 13.0 13.8 13.3  HCT 36.0 - 46.0 % 38.2 40.6 39.0  PLT 150.0 - 400.0 K/uL 307.0 398.0 336.0  NEUTROABS 1.4 - 7.7 K/uL 3.1 3.8 2.6  LYMPHSABS 0.7 - 4.0 K/uL 1.5 1.9 1.9     There is no height or weight on file to calculate BMI.  Orders:  No orders of the defined types were placed in this encounter.  No orders of the defined types were placed in this encounter.    Procedures: No procedures performed  Clinical Data: No additional findings.  ROS:  All other systems negative, except as noted in the HPI. Review of Systems  Objective: Vital Signs: There were no vitals taken for this visit.  Specialty Comments:  No specialty comments available.  PMFS History: Patient Active Problem List   Diagnosis Date Noted   Toe pain, chronic, right 09/17/2020   Grief at loss of child 07/24/2020   Dysphagia 01/02/2020   Globus sensation 01/02/2020   Thoracic back pain 12/19/2019   Sore throat 10/24/2019   Goiter 10/24/2019   Cough 10/24/2019   Neoplasm of uncertain behavior of skin 09/27/2019   Pain of right breast 06/03/2019   Fatty liver 01/23/2019   Hand pain 09/28/2018    Knee pain 09/28/2018   Inappropriate sinus tachycardia 05/17/2018   Apnea 05/17/2018   Upper respiratory infection 09/23/2017   Fatigue 09/12/2017   Well adult exam 01/19/2017   Abdominal pain 01/19/2017   Headache 10/27/2016   Pain in right shoulder 09/16/2016   Left hip pain 09/16/2016   Low vitamin B12 level 03/16/2016   Piriformis syndrome of right side 02/04/2016   IT band syndrome 02/04/2016   RUQ abdominal pain 02/04/2016   Low back pain radiating to right lower extremity 09/19/2015   Allergic rhinitis 09/19/2015   Atypical chest pain 04/07/2015   GERD (gastroesophageal reflux disease) 04/07/2015   Axillary adenitis 03/23/2015   Migraine headache 03/23/2015   Hives 01/29/2015   Edema 01/29/2015   Cervical disc disorder with radiculopathy of cervical region 12/11/2014   Insomnia 07/31/2014   Degenerative cervical disc 05/08/2014   Pain in joint, shoulder region 03/05/2014   Patellofemoral arthralgia of both knees 03/01/2014   Bilateral shoulder pain 10/18/2013   Sinusitis, bacterial 08/20/2013   URI, acute 08/14/2013   Hemoptysis 08/14/2013   Otitis media of left ear 08/14/2013   Labral tear of shoulder 07/03/2013   Hyperthyroidism 07/03/2013   HTN (hypertension), benign 07/03/2013   LVH (left ventricular hypertrophy) due to hypertensive disease 07/03/2013   Vitamin D deficiency 07/03/2013   Past Medical History:  Diagnosis Date   Abnormal cervical Pap smear with positive HPV DNA test 01/2014,01/2017   2015 Normal cytology with positive high-risk HPV 18/45. Colposcopy negative with negative ECC.  2018 normal cytology with positive high-risk HPV 18/45   Anemia    Apnea 05/17/2018   Autoimmune gastritis    Fatty liver    Gastritis    Heart murmur    Hypertension    Hyperthyroidism    Inappropriate sinus tachycardia 05/17/2018   Internal hemorrhoids    Osteoarthritis    Right shoulder pain 2001   chronic pain   Thyroid disease    Hyperthyroid. Was on PTU (Dr  Hampton Abbot in [place]) - stopped in 2013, stable   Tubular adenoma of colon     Family History  Problem Relation Age of Onset   Kidney disease Mother        ESRD   Hypertension Mother    Lung cancer Father    Hypertension Sister    Kidney disease Sister    Hypertension Sister    Diabetes Brother    Hypertension Brother    Hypertension Brother    Diabetes Paternal Grandmother    Sleep apnea Son    Hypertension Son    Colon cancer Neg  Hx    Stomach cancer Neg Hx    Esophageal cancer Neg Hx    Rectal cancer Neg Hx    Pancreatic cancer Neg Hx     Past Surgical History:  Procedure Laterality Date   BREAST CYST ASPIRATION Left 2012   CESAREAN SECTION     x 4    COLONOSCOPY     HAND SURGERY Left    SHOULDER SURGERY Right 2002   SHOULDER SURGERY Left 2014   TUBAL LIGATION     UPPER GASTROINTESTINAL ENDOSCOPY     Social History   Occupational History   Occupation: RETIRED  Tobacco Use   Smoking status: Never   Smokeless tobacco: Never  Vaping Use   Vaping Use: Never used  Substance and Sexual Activity   Alcohol use: Yes    Comment: RARELY   Drug use: No   Sexual activity: Yes    Birth control/protection: Post-menopausal, Surgical    Comment: BTL-1st intercourse 16 yo-5 partners

## 2021-06-23 NOTE — Telephone Encounter (Signed)
Spoke with my colleagues of advanced endoscopy.  We are going to await the results of her CT scan, if negative will recommend EUS in 6 months to further evaluate antral lesion, ensure no evidence of carcinoid.  We will discuss plans with patient with results of her CT scan back.

## 2021-06-29 ENCOUNTER — Other Ambulatory Visit: Payer: Self-pay

## 2021-06-29 ENCOUNTER — Ambulatory Visit: Payer: Medicare Other | Admitting: Physician Assistant

## 2021-06-29 DIAGNOSIS — M170 Bilateral primary osteoarthritis of knee: Secondary | ICD-10-CM

## 2021-06-29 MED ORDER — HYLAN G-F 20 16 MG/2ML IX SOSY
16.0000 mg | PREFILLED_SYRINGE | INTRA_ARTICULAR | Status: AC | PRN
  Administered 2021-06-29: 16 mg via INTRA_ARTICULAR

## 2021-06-29 MED ORDER — LIDOCAINE HCL 1 % IJ SOLN
1.5000 mL | INTRAMUSCULAR | Status: AC | PRN
  Administered 2021-06-29: 1.5 mL via INTRA_ARTICULAR

## 2021-06-29 NOTE — Progress Notes (Signed)
   Procedure Note  Patient: Karina Newman             Date of Birth: Apr 12, 1956           MRN: [id number]             Visit Date: 06/29/2021  Procedures: Visit Diagnoses:  1. Primary osteoarthritis of both knees     Synvisc #1 bilateral knees, B/B Large Joint Inj: bilateral knee on 06/29/2021 11:35 AM Indications: pain Details: 25 G 1.5 in needle, medial approach  Arthrogram: No  Medications (Right): 1.5 mL lidocaine 1 %; 16 mg Hylan 16 MG/2ML Aspirate (Right): 0 mL Medications (Left): 1.5 mL lidocaine 1 %; 16 mg Hylan 16 MG/2ML Aspirate (Left): 0 mL Outcome: tolerated well, no immediate complications Procedure, treatment alternatives, risks and benefits explained, specific risks discussed. Consent was given by the patient. Immediately prior to procedure a time out was called to verify the correct patient, procedure, equipment, support staff and site/side marked as required. Patient was prepped and draped in the usual sterile fashion.    Patient tolerated the procedure well.  Aftercare was discussed.  Hazel Sams, PA-C

## 2021-07-01 ENCOUNTER — Other Ambulatory Visit: Payer: Self-pay

## 2021-07-01 ENCOUNTER — Ambulatory Visit (INDEPENDENT_AMBULATORY_CARE_PROVIDER_SITE_OTHER)
Admission: RE | Admit: 2021-07-01 | Discharge: 2021-07-01 | Disposition: A | Discharge status: Home / Self Care | Payer: Medicare Other | Source: Ambulatory Visit | Attending: Gastroenterology | Admitting: Gastroenterology

## 2021-07-01 DIAGNOSIS — K2940 Chronic atrophic gastritis without bleeding: Secondary | ICD-10-CM | POA: Diagnosis not present

## 2021-07-01 DIAGNOSIS — R110 Nausea: Secondary | ICD-10-CM | POA: Diagnosis not present

## 2021-07-01 DIAGNOSIS — R1013 Epigastric pain: Secondary | ICD-10-CM | POA: Diagnosis not present

## 2021-07-01 DIAGNOSIS — K4020 Bilateral inguinal hernia, without obstruction or gangrene, not specified as recurrent: Secondary | ICD-10-CM | POA: Diagnosis not present

## 2021-07-01 DIAGNOSIS — K219 Gastro-esophageal reflux disease without esophagitis: Secondary | ICD-10-CM

## 2021-07-01 DIAGNOSIS — K3189 Other diseases of stomach and duodenum: Secondary | ICD-10-CM

## 2021-07-01 DIAGNOSIS — K760 Fatty (change of) liver, not elsewhere classified: Secondary | ICD-10-CM | POA: Diagnosis not present

## 2021-07-01 DIAGNOSIS — G8929 Other chronic pain: Secondary | ICD-10-CM

## 2021-07-06 ENCOUNTER — Ambulatory Visit: Payer: Medicare Other | Admitting: Physician Assistant

## 2021-07-06 ENCOUNTER — Other Ambulatory Visit: Payer: Self-pay

## 2021-07-06 DIAGNOSIS — M170 Bilateral primary osteoarthritis of knee: Secondary | ICD-10-CM

## 2021-07-06 MED ORDER — HYLAN G-F 20 16 MG/2ML IX SOSY
16.0000 mg | PREFILLED_SYRINGE | INTRA_ARTICULAR | Status: AC | PRN
Start: 2021-07-06 — End: 2021-07-06
  Administered 2021-07-06: 16 mg via INTRA_ARTICULAR

## 2021-07-06 MED ORDER — LIDOCAINE HCL 1 % IJ SOLN
1.5000 mL | INTRAMUSCULAR | Status: AC | PRN
Start: 2021-07-06 — End: 2021-07-06
  Administered 2021-07-06: 1.5 mL

## 2021-07-06 NOTE — Progress Notes (Signed)
    Procedure Note  Patient: Karina Newman             Date of Birth: [birth year]           MRN: [id number]             Visit Date: 07/06/2021  Procedures: Visit Diagnoses:  1. Primary osteoarthritis of both knees    Synvisc #2 Bilateral knee joint injections B/B Large Joint Inj: bilateral knee on 07/06/2021 12:00 PM Indications: pain Details: 25 G 1.5 in needle, medial approach  Arthrogram: No  Medications (Right): 1.5 mL lidocaine 1 %; 16 mg Hylan 16 MG/2ML Aspirate (Right): 0 mL Medications (Left): 1.5 mL lidocaine 1 %; 16 mg Hylan 16 MG/2ML Aspirate (Left): 0 mL Outcome: tolerated well, no immediate complications Procedure, treatment alternatives, risks and benefits explained, specific risks discussed. Consent was given by the patient. Immediately prior to procedure a time out was called to verify the correct patient, procedure, equipment, support staff and site/side marked as required. Patient was prepped and draped in the usual sterile fashion.    Patient tolerated the procedure well.  Aftercare was discussed.  Hazel Sams, PA-C

## 2021-07-10 ENCOUNTER — Other Ambulatory Visit: Payer: Self-pay | Admitting: Internal Medicine

## 2021-07-13 ENCOUNTER — Ambulatory Visit: Payer: Medicare Other | Admitting: Physician Assistant

## 2021-07-13 ENCOUNTER — Other Ambulatory Visit: Payer: Self-pay

## 2021-07-13 DIAGNOSIS — M170 Bilateral primary osteoarthritis of knee: Secondary | ICD-10-CM

## 2021-07-13 MED ORDER — LIDOCAINE HCL 1 % IJ SOLN
1.5000 mL | INTRAMUSCULAR | Status: AC | PRN
  Administered 2021-07-13: 1.5 mL via INTRA_ARTICULAR

## 2021-07-13 MED ORDER — HYLAN G-F 20 16 MG/2ML IX SOSY
16.0000 mg | PREFILLED_SYRINGE | INTRA_ARTICULAR | Status: AC | PRN
Start: 2021-07-13 — End: 2021-07-13
  Administered 2021-07-13: 16 mg via INTRA_ARTICULAR

## 2021-07-13 MED ORDER — LIDOCAINE HCL 1 % IJ SOLN
1.5000 mL | INTRAMUSCULAR | Status: AC | PRN
Start: 2021-07-13 — End: 2021-07-13
  Administered 2021-07-13: 1.5 mL via INTRA_ARTICULAR

## 2021-07-13 NOTE — Progress Notes (Signed)
   Procedure Note  Patient: Karina Newman             Date of Birth: August 20, 1955           MRN: [id number]             Visit Date: 07/13/2021  Procedures: Visit Diagnoses:  1. Primary osteoarthritis of both knees    Synvisc #3 bilateral knees, B/B Large Joint Inj: bilateral knee on 07/13/2021 11:52 AM Indications: pain Details: 25 G 1.5 in needle, medial approach  Arthrogram: No  Medications (Right): 1.5 mL lidocaine 1 %; 16 mg Hylan 16 MG/2ML Aspirate (Right): 0 mL Medications (Left): 1.5 mL lidocaine 1 %; 16 mg Hylan 16 MG/2ML Aspirate (Left): 0 mL Outcome: tolerated well, no immediate complications Procedure, treatment alternatives, risks and benefits explained, specific risks discussed. Consent was given by the patient. Immediately prior to procedure a time out was called to verify the correct patient, procedure, equipment, support staff and site/side marked as required. Patient was prepped and draped in the usual sterile fashion.     Patient tolerated the procedure well.  Aftercare was discussed.  Hazel Sams, PA-C

## 2021-07-21 ENCOUNTER — Ambulatory Visit: Payer: Medicare Other | Admitting: Orthopaedic Surgery

## 2021-07-23 NOTE — Progress Notes (Signed)
Office Visit    Patient Name: Karina Newman Date of Encounter: 07/24/2021  PCP:  Cassandria Anger, MD   [place]  Cardiologist:  Skeet Latch, MD  Advanced Practice Provider:  No care team member to display Electrophysiologist:  None      Chief Complaint    Karina Newman is a 65 y.o. female with a hx of HTN, autoimmune gastritis, hyperthyroidism, chest pain, abnormal EKG presents today for follow up after echocardiogram   Past Medical History    Past Medical History:  Diagnosis Date   Abnormal cervical Pap smear with positive HPV DNA test 01/2014,01/2017   2015 Normal cytology with positive high-risk HPV 18/45. Colposcopy negative with negative ECC.  2018 normal cytology with positive high-risk HPV 18/45   Anemia    Apnea 05/17/2018   Autoimmune gastritis    Fatty liver    Gastritis    Heart murmur    Hypertension    Hyperthyroidism    Inappropriate sinus tachycardia 05/17/2018   Internal hemorrhoids    Osteoarthritis    Right shoulder pain 2001   chronic pain   Thyroid disease    Hyperthyroid. Was on PTU (Dr Hampton Abbot in [place]) - stopped in 2013, stable   Tubular adenoma of colon    Past Surgical History:  Procedure Laterality Date   BREAST CYST ASPIRATION Left 2012   CESAREAN SECTION     x 4    COLONOSCOPY     HAND SURGERY Left    SHOULDER SURGERY Right 2002   SHOULDER SURGERY Left 2014   TUBAL LIGATION     UPPER GASTROINTESTINAL ENDOSCOPY      Allergies  Allergies  Allergen Reactions   Penicillins Hives   Tramadol Anaphylaxis    Memory lapses Memory lapses   Gabapentin     Nausea    Iodine Other (See Comments)    Due to hyperthyroidism   Lexapro [Escitalopram]     fatigue   Lyrica [Pregabalin] Dermatitis   Nsaids     gastritis   Voltaren [Diclofenac] Hives    History of Present Illness    Karina Newman is a 65 y.o. female with a hx of HTN, autoimmune gastritis, hyperthyroidism, chest pain, abnormal EKG last  seen 04/17/21 by Dr. Oval Linsey.  Initially seen for evaluation of chest pain and abnormal EKG in 2019. EKG revealed LVh and nonspecific T wave abnormality. Myoview 05/2018 LVEF 544 with 1 mm ST depression with exertion. She had small, mild defect in the basal to mid anteroseptal and apical septal region. Unclear whether breast attenuation or ischemia. She was referred for coronary CTA 06/2018 with calcium score of 0.   BP has required addition of Doxazosin previously. She was seen 04/17/21 by Dr. Oval Linsey. Her son passed away suddenly last year due to hypertrophic cardiomyopathy. She reported occasional sharp pains inferior to her breast which occurred at rest. Noted lightheadedness and fatigue with stairs. BP was well controlled at home. Her current antihypertensive regimen was continued. Updated echo ordered and performed 05/07/21 showing LVEF 50-55%, no RWMA, normal diastolic parameters, RV normal size and function, normal PASP, trivial MR, small circumferential pericardial effusion.   She presents today for follow up. Notes headache, lightheadedness with position changes, and fatigue for one week. Notes persistent palpitations which she associates with stress and anxiety. Follows with primary care related to her anxiety. No chest pain, dyspnea, near syncope, nor syncope. Reports mild stuffy nose consistent with her allergies but no cough, wheeze, fever.  BP at home has been as low as 100/69 though most often 120s/80s. No hypertensive readings. Reports still has difficulty sleeping since the loss of her son. She does not eat regular meals and does not have much of an appetite. She eats a piece of toast in the morning then forces herself to eat another meal around 3 or 4pm. She drinks between 4 and 5 bottles of water throughout the day.  Also notes floaters in her vision and encouraged to discuss with her ophthalmologist.  EKGs/Labs/Other Studies Reviewed:   The following studies were reviewed today:  Echo  04/2021  1. Left ventricular ejection fraction, by estimation, is 50 to 55%. The  left ventricle has low normal function. The left ventricle has no regional  wall motion abnormalities. Left ventricular diastolic parameters are  indeterminate.   2. Right ventricular systolic function is normal. The right ventricular  size is normal. There is normal pulmonary artery systolic pressure.   3. A small pericardial effusion is present. The pericardial effusion is  circumferential. There is no evidence of cardiac tamponade.   4. The mitral valve is normal in structure. Trivial mitral valve  regurgitation. No evidence of mitral stenosis.   5. The aortic valve is tricuspid. Aortic valve regurgitation is not  visualized. No aortic stenosis is present.   6. The inferior vena cava is normal in size with greater than 50%  respiratory variability, suggesting right atrial pressure of 3 mmHg.   US Thyroid 10/31/2019: IMPRESSION: 1. Mildly enlarged, mildly heterogeneous thyroid gland as detailed above. 2. There is a 1.2 cm TR 4 thyroid nodule in the left inferior thyroid gland. A 1 year follow-up ultrasound is recommended for this thyroid nodule. 3. There is a 1.1 cm TR 3 thyroid nodule in the right inferior thyroid gland. No further follow-up is required for this thyroid nodule.   Coronary CT-A 06/26/18: No CAD.  Coronary calcium score 0.   Exercise Myoview 05/30/18: The left ventricular ejection fraction is mildly decreased (45-54%). Nuclear stress EF: 54%. Blood pressure demonstrated a normal response to exercise. There was 34mm of horizontal ST depression in the inferolateral leads at peak exercise with T wave inversions in recovery There is a small defect of mild severity present in the basal anteroseptal, mid anteroseptal and apical septal location. The defect is reversible. Cannot rule out small area of ischemia vs. Variations in breast attenuation artifact. There is transient ischemic  dilatation with TID of 1.2 which could indicate multivessel CAD. Consider coronary CTA for further evaulation. This is an intermediate risk study due to increased TID.   Echo 03/22/13: LVEF 55%.  Moderate LVH.  Grade 1 diastolic dysfunction. Trace MR.  Moderate TR.  EKG:  No EKG today  Recent Labs: 12/04/2020: ALT 24; BUN 14; Creatinine, Ser 0.80; Hemoglobin 13.0; Platelets 307.0; Potassium 3.1; Sodium 136; TSH 0.44  Recent Lipid Panel    Component Value Date/Time   CHOL 161 12/04/2020 1408   TRIG 101.0 12/04/2020 1408   HDL 40.20 12/04/2020 1408   CHOLHDL 4 12/04/2020 1408   VLDL 20.2 12/04/2020 1408   LDLCALC 101 (H) 12/04/2020 1408    Home Medications   Current Meds  Medication Sig   amLODipine (NORVASC) 5 MG tablet TAKE 1 TABLET (5 MG TOTAL) BY MOUTH DAILY.   aspirin EC 81 MG tablet Take 1 tablet (81 mg total) by mouth daily.   Cyanocobalamin (VITAMIN B-12) 500 MCG SUBL 1 sl qd   cyclobenzaprine (FLEXERIL) 5 MG tablet TAKE 1  TABLET BY MOUTH THREE TIMES A DAY AS NEEDED FOR MUSCLE SPASMS   Diclofenac Sodium 1.5 % SOLN USE 15 DROPS THREE TIMES DAILY EXTERNALLY FOR PAIN   dicyclomine (BENTYL) 10 MG capsule Take 1 capsule (10 mg total) by mouth every 8 (eight) hours as needed for spasms.   doxazosin (CARDURA) 4 MG tablet TAKE 1 TABLET BY MOUTH EVERY DAY   fluticasone (FLONASE) 50 MCG/ACT nasal spray Place 1 spray into both nostrils as needed.   gabapentin (NEURONTIN) 100 MG capsule TAKE 1 CAPSULE BY MOUTH THREE TIMES A DAY AS NEEDED   LORazepam (ATIVAN) 0.5 MG tablet Take 1 tablet (0.5 mg total) by mouth 2 (two) times daily as needed for anxiety or sleep.   omeprazole (PRILOSEC) 40 MG capsule TAKE 1 CAPSULE (40 MG TOTAL) BY MOUTH IN THE MORNING AND AT BEDTIME.   oxyCODONE-acetaminophen (PERCOCET/ROXICET) 5-325 MG tablet Take 1 tablet by mouth 2 (two) times daily as needed for severe pain.   polyethylene glycol (MIRALAX) 17 g packet Take daily as directed. Increase or decrease as  needed   potassium chloride (KLOR-CON) 10 MEQ tablet Take 1 tablet (10 mEq total) by mouth daily. Take 1 tablet (10 mEq total) by mouth daily.   sucralfate (CARAFATE) 1 g tablet TAKE 1 TABLET BY MOUTH 3 TIMES A DAY BETWEEN MEALS   telmisartan-hydrochlorothiazide (MICARDIS HCT) 80-25 MG tablet TAKE 1 TABLET BY MOUTH EVERY DAY   traZODone (DESYREL) 50 MG tablet TAKE 1 TABLET BY MOUTH EVERYDAY AT BEDTIME   valACYclovir (VALTREX) 500 MG tablet Take one tablet po BID x 3 days prn outbreak   Vitamin D, Cholecalciferol, 1000 units CAPS Take 2,000 Units by mouth.   vortioxetine HBr (TRINTELLIX) 5 MG TABS tablet Take 1 tablet (5 mg total) by mouth daily.     Review of Systems    All other systems reviewed and are otherwise negative except as noted above.  Physical Exam    VS:  BP 122/82   Pulse 73   Ht 5\' 4"  (1.626 m)   Wt 169 lb (76.7 kg)   BMI 29.01 kg/m  , BMI Body mass index is 29.01 kg/m.  Wt Readings from Last 3 Encounters:  07/24/21 169 lb (76.7 kg)  06/22/21 167 lb (75.8 kg)  06/18/21 162 lb (73.5 kg)    GEN: Well nourished, well developed, in no acute distress. HEENT: normal. Neck: Supple, no JVD, carotid bruits, or masses. Cardiac: RRR, no murmurs, rubs, or gallops. No clubbing, cyanosis, edema.  Radials/PT 2+ and equal bilaterally.  Respiratory:  Respirations regular and unlabored, clear to auscultation bilaterally. GI: Soft, nontender, nondistended. MS: No deformity or atrophy. Skin: Warm and dry, no rash. Neuro:  Strength and sensation are intact. Psych: Normal affect.  Assessment & Plan    HTN / Orthostatic hypotension - BP drop of 10 points from sitting to standing with heart rate escalating 10 points. Associated with lightheadedness. Will stop Telmisartan-HCTZ and transition got Telmisartan 80mg  QD. Anticipate diuretic effect of HCTZ contributory to symptoms. We will continue the remainder of her antihypertensive regimen.  She does have overall poor PO intake as she  tells me she "does not feel like eating" since her son passed last year. Discussed eating regular meals, staying hydrated, making position changes slowly, compression stockings. CBC, Thyroid panel, CMP today to rule out alternate etiology for her lightheadedness, headache, fatigue.   Orthostatic VS for the past 24 hrs (Last 3 readings):  BP- Lying Pulse- Lying BP- Sitting Pulse- Sitting BP-  Standing at 0 minutes Pulse- Standing at 0 minutes BP- Standing at 3 minutes Pulse- Standing at 3 minutes  07/24/21 1152 140/83 69 136/80 73 126/83 82 133/77 76    LVH due to hypertensive heart disease - Echo 04/2021 LVEF 50-55% with no LVH. Family history of sudden death due to hypertrophic cardiomyopathy in her son last year. Given reassuring echocardiogram will not plan for MRI at this time.  Anxiety / Depression - Follows with primary care.   Inappropriate sinus tachycardia / Palpitations -due to recurrent palpitations we will plan for 14-day ZIO monitor.  Heart rate is well controlled today and as such we will continue current dose Coreg 12.5 mg twice daily.  Some concern that episodes of SVT or inappropriate sinus tachycardia could be contributing to lightheadedness.  Disposition: Follow up in 2-3 month(s) with Skeet Latch, MD or APP.  Signed, Loel Dubonnet, NP 07/24/2021, 11:59 AM [place]

## 2021-07-24 ENCOUNTER — Encounter (HOSPITAL_BASED_OUTPATIENT_CLINIC_OR_DEPARTMENT_OTHER): Payer: Self-pay | Admitting: Family

## 2021-07-24 ENCOUNTER — Other Ambulatory Visit: Payer: Self-pay

## 2021-07-24 ENCOUNTER — Ambulatory Visit (HOSPITAL_BASED_OUTPATIENT_CLINIC_OR_DEPARTMENT_OTHER): Payer: Medicare Other | Admitting: Family

## 2021-07-24 ENCOUNTER — Ambulatory Visit (INDEPENDENT_AMBULATORY_CARE_PROVIDER_SITE_OTHER): Payer: Medicare Other

## 2021-07-24 VITALS — BP 122/82 | HR 73 | Ht 64.0 in | Wt 169.0 lb

## 2021-07-24 DIAGNOSIS — R000 Tachycardia, unspecified: Secondary | ICD-10-CM

## 2021-07-24 DIAGNOSIS — R002 Palpitations: Secondary | ICD-10-CM | POA: Diagnosis not present

## 2021-07-24 DIAGNOSIS — I10 Essential (primary) hypertension: Secondary | ICD-10-CM | POA: Diagnosis not present

## 2021-07-24 DIAGNOSIS — I951 Orthostatic hypotension: Secondary | ICD-10-CM

## 2021-07-24 DIAGNOSIS — Z8489 Family history of other specified conditions: Secondary | ICD-10-CM | POA: Diagnosis not present

## 2021-07-24 MED ORDER — TELMISARTAN 80 MG PO TABS: 80.0000 mg | ORAL_TABLET | Freq: Every day | ORAL | 3 refills | Status: DC

## 2021-07-24 NOTE — Patient Instructions (Signed)
Medication Instructions:  Your physician has recommended you make the following change in your medication:   Stop: Telmisartan- Hydrochlorothiazide  Start: Telmisartan (Micardis) 80 mg tablet daily    *If you need a refill on your cardiac medications before your next appointment, please call your pharmacy*   Lab Work: Your physician recommends that you return for lab work today or Monday at [place], CBC, thyroid Panel   If you have labs (blood work) drawn today and your tests are completely normal, you will receive your results only by: [employer] (if you have MyChart) OR A paper copy in the mail If you have any lab test that is abnormal or we need to change your treatment, we will call you to review the results.   Testing/Procedures: Your physician has recommended that you wear a Zio monitor.   This monitor is a medical device that records the heart's electrical activity. Doctors most often use these monitors to diagnose arrhythmias. Arrhythmias are problems with the speed or rhythm of the heartbeat. The monitor is a small device applied to your chest. You can wear one while you do your normal daily activities. While wearing this monitor if you have any symptoms to push the button and record what you felt. Once you have worn this monitor for the period of time provider prescribed (Usually 14 days), you will return the monitor device in the postage paid box. Once it is returned they will download the data collected and provide [place] with a report which the provider will then review and we will call you with those results. Important tips:  Avoid showering during the first 24 hours of wearing the monitor. Avoid excessive sweating to help maximize wear time. Do not submerge the device, no hot tubs, and no swimming pools. Keep any lotions or oils away from the patch. After 24 hours you may shower with the patch on. Take brief showers with your back facing the shower head.  Do not  remove patch once it has been placed because that will interrupt data and decrease adhesive wear time. Push the button when you have any symptoms and write down what you were feeling. Once you have completed wearing your monitor, remove and place into box which has postage paid and place in your outgoing mailbox.  If for some reason you have misplaced your box then call our office and we can provide another box and/or mail it off for you.      Follow-Up: At [facility], you and your health needs are our priority.  As part of our continuing mission to provide you with exceptional heart care, we have created designated Provider Care Teams.  These Care Teams include your primary Cardiologist (physician) and Advanced Practice Providers (APPs -  Physician Assistants and Nurse Practitioners) who all work together to provide you with the care you need, when you need it.  We recommend signing up for the patient portal called "MyChart".  Sign up information is provided on this After Visit Summary.  MyChart is used to connect with patients for Virtual Visits (Telemedicine).  Patients are able to view lab/test results, encounter notes, upcoming appointments, etc.  Non-urgent messages can be sent to your provider as well.   To learn more about what you can do with MyChart, go to [email or web address].    Your next appointment:   2-3 month(s)  The format for your next appointment:   In Person  Provider:   Skeet Latch, MD, Laurann Montana, NP,  or Coletta Memos, NP    Other Instructions Keep up the good work with water intake! Try to add something with electrolytes if you are feeling very lightheaded that day. Try to eat small regular meals (even a snack like trail mix or peanut butter crackers) to help prevent lightheadedness.   Orthostatic Hypotension Blood pressure is a measurement of how strongly, or weakly, your circulating blood is pressing against the walls of your arteries. Orthostatic  hypotension is a drop in blood pressure that can happen when you change positions, such as when you go from lying down to standing. Arteries are blood vessels that carry blood from your heart throughout your body. When blood pressure is too low, you may not get enough blood to your brain or to the rest of your organs. Orthostatic hypotension can cause light-headedness, sweating, rapid heartbeat, blurred vision, and fainting. These symptoms require further investigation into the cause. What are the causes? Orthostatic hypotension can be caused by many things, including: Sudden changes in posture, such as standing up quickly after you have been sitting or lying down. Loss of blood (anemia) or loss of body fluids (dehydration). Heart problems, neurologic problems, or hormone problems. Pregnancy. Aging. The risk for this condition increases as you get older. Severe infection (sepsis). Certain medicines, such as medicines for high blood pressure or medicines that make the body lose excess fluids (diuretics). What are the signs or symptoms? Symptoms of this condition may include: Weakness, light-headedness, or dizziness. Sweating. Blurred vision. Tiredness (fatigue). Rapid heartbeat. Fainting, in severe cases. How is this diagnosed? This condition is diagnosed based on: Your symptoms and medical history. Your blood pressure measurements. Your health care provider will check your blood pressure when you are: Lying down. Sitting. Standing. A blood pressure reading is recorded as two numbers, such as "120 over 80" (or 120/80). The first ("top") number is called the systolic pressure. It is a measure of the pressure in your arteries as your heart beats. The second ("bottom") number is called the diastolic pressure. It is a measure of the pressure in your arteries when your heart relaxes between beats. Blood pressure is measured in a unit called mmHg. Healthy blood pressure for most adults is 120/80  mmHg. Orthostatic hypotension is defined as a 20 mmHg drop in systolic pressure or a 10 mmHg drop in diastolic pressure within 3 minutes of standing. Other information or tests that may be used to diagnose orthostatic hypotension include: Your other vital signs, such as your heart rate and temperature. Blood tests. An electrocardiogram (ECG) or echocardiogram. A Holter monitor. This is a device you wear that records your heart rhythm continuously, usually for 24-48 hours. Tilt table test. For this test, you will be safely secured to a table that moves you from a lying position to an upright position. Your heart rhythm and blood pressure will be monitored during the test. How is this treated? This condition may be treated by: Changing your diet. This may involve eating more salt (sodium) or drinking more water. Changing the dosage of certain medicines you are taking that might be lowering your blood pressure. Correcting the underlying reason for the orthostatic hypotension. Wearing compression stockings. Taking medicines to raise your blood pressure. Avoiding actions that trigger symptoms. Follow these instructions at home: Medicines Take over-the-counter and prescription medicines only as told by your health care provider. Follow instructions from your health care provider about changing the dosage of your current medicines, if this applies. Do not stop or adjust  any of your medicines on your own. Eating and drinking  Drink enough fluid to keep your urine pale yellow. Eat extra salt only as directed. Do not add extra salt to your diet unless advised by your health care provider. Eat frequent, small meals. Avoid standing up suddenly after eating. General instructions  Get up slowly from lying down or sitting positions. This gives your blood pressure a chance to adjust. Avoid hot showers and excessive heat as directed by your health care provider. Engage in regular physical activity as  directed by your health care provider. If you have compression stockings, wear them as told. Keep all follow-up visits. This is important. Contact a health care provider if: You have a fever for more than 2-3 days. You feel more thirsty than usual. You feel dizzy or weak. Get help right away if: You have chest pain. You have a fast or irregular heartbeat. You become sweaty or feel light-headed. You feel short of breath. You faint. You have any symptoms of a stroke. "BE FAST" is an easy way to remember the main warning signs of a stroke: B - Balance. Signs are dizziness, sudden trouble walking, or loss of balance. E - Eyes. Signs are trouble seeing or a sudden change in vision. F - Face. Signs are sudden weakness or numbness of the face, or the face or eyelid drooping on one side. A - Arms. Signs are weakness or numbness in an arm. This happens suddenly and usually on one side of the body. S - Speech. Signs are sudden trouble speaking, slurred speech, or trouble understanding what people say. T - Time. Time to call emergency services. Write down what time symptoms started. You have other signs of a stroke, such as: A sudden, severe headache with no known cause. Nausea or vomiting. Seizure. These symptoms may represent a serious problem that is an emergency. Do not wait to see if the symptoms will go away. Get medical help right away. Call your local emergency services (911 in the [place] Do not drive yourself to the hospital. Summary Orthostatic hypotension is a sudden drop in blood pressure. It can cause light-headedness, sweating, rapid heartbeat, blurred vision, and fainting. Orthostatic hypotension can be diagnosed by having your blood pressure taken while lying down, sitting, and then standing. Treatment may involve changing your diet, wearing compression stockings, sitting up slowly, adjusting your medicines, or correcting the underlying reason for the orthostatic hypotension. Get  help right away if you have chest pain, a fast or irregular heartbeat, or symptoms of a stroke. This information is not intended to replace advice given to you by your health care provider. Make sure you discuss any questions you have with your health care provider. Document Revised: 10/16/2020 Document Reviewed: 10/16/2020 Elsevier Patient Education  [place].

## 2021-07-25 LAB — THYROID PANEL WITH TSH
Free Thyroxine Index: 1.9 (ref 1.2–4.9)
T3 Uptake Ratio: 25 % (ref 24–39)
T4, Total: 7.7 ug/dL (ref 4.5–12.0)
TSH: 0.59 u[IU]/mL (ref 0.450–4.500)

## 2021-07-25 LAB — COMPREHENSIVE METABOLIC PANEL
ALT: 22 IU/L (ref 0–32)
AST: 18 IU/L (ref 0–40)
Albumin/Globulin Ratio: 1.4 (ref 1.2–2.2)
Albumin: 4.4 g/dL (ref 3.8–4.8)
Alkaline Phosphatase: 95 IU/L (ref 44–121)
BUN/Creatinine Ratio: 12 (ref 12–28)
BUN: 10 mg/dL (ref 8–27)
Bilirubin Total: 0.3 mg/dL (ref 0.0–1.2)
CO2: 26 mmol/L (ref 20–29)
Calcium: 9.5 mg/dL (ref 8.7–10.3)
Chloride: 100 mmol/L (ref 96–106)
Creatinine, Ser: 0.84 mg/dL (ref 0.57–1.00)
Globulin, Total: 3.2 g/dL (ref 1.5–4.5)
Glucose: 84 mg/dL (ref 70–99)
Potassium: 3.8 mmol/L (ref 3.5–5.2)
Sodium: 139 mmol/L (ref 134–144)
Total Protein: 7.6 g/dL (ref 6.0–8.5)
eGFR: 78 mL/min/{1.73_m2} (ref >59)

## 2021-07-25 LAB — CBC
Hematocrit: 38.9 % (ref 34.0–46.6)
Hemoglobin: 13.2 g/dL (ref 11.1–15.9)
MCH: 31.7 pg (ref 26.6–33.0)
MCHC: 33.9 g/dL (ref 31.5–35.7)
MCV: 94 fL (ref 79–97)
Platelets: 326 10*3/uL (ref 150–450)
RBC: 4.16 x10E6/uL (ref 3.77–5.28)
RDW: 11.4 % — ABNORMAL LOW (ref 11.7–15.4)
WBC: 6 10*3/uL (ref 3.4–10.8)

## 2021-08-04 ENCOUNTER — Telehealth: Payer: Self-pay | Admitting: Cardiovascular Disease

## 2021-08-04 NOTE — Telephone Encounter (Signed)
Please inform patient to go ahead and mail the monitor in.   Thanks! Loel Dubonnet, NP

## 2021-08-04 NOTE — Telephone Encounter (Signed)
Pt started wearing her 14 day heart monitor on 07/24/21, she was washing her hair today and it fell off, it was not supposed to come off til 08/07/21. Pt wants to know if she should tape the monitor back on or can she go ahead and mail it back in

## 2021-08-05 NOTE — Telephone Encounter (Signed)
Called pt. And advised her to mail the monitor back in since it fell off. Reassured patient that we would call her once we got the monitor report!

## 2021-08-06 ENCOUNTER — Other Ambulatory Visit: Payer: Self-pay | Admitting: Internal Medicine

## 2021-08-06 ENCOUNTER — Telehealth: Payer: Self-pay

## 2021-08-06 MED ORDER — OMEPRAZOLE 40 MG PO CPDR: 40.0000 mg | DELAYED_RELEASE_CAPSULE | Freq: Two times a day (BID) | ORAL | 0 refills | Status: DC

## 2021-08-06 MED ORDER — CARVEDILOL 12.5 MG PO TABS: 12.5000 mg | ORAL_TABLET | Freq: Two times a day (BID) | ORAL | 0 refills | Status: DC

## 2021-08-06 MED ORDER — DOXAZOSIN MESYLATE 4 MG PO TABS: 4.0000 mg | ORAL_TABLET | Freq: Every day | ORAL | 0 refills | Status: DC

## 2021-08-06 NOTE — Telephone Encounter (Signed)
Pt called stating that she is in [place] and have left 3 of her meds at home. She will be there 30 days and asking for a month supply of: Carvedilol 12.5 mg doxazosin 4mg  omeprozole 40mg  To be sent to: [facility]  [address] [place] [address] [phone]  Pt would like a call after meds have been sent.

## 2021-08-06 NOTE — Telephone Encounter (Signed)
Notified pt 30 day [facility] [place].Marland KitchenJohny Newman

## 2021-08-11 ENCOUNTER — Telehealth: Payer: Self-pay

## 2021-08-11 NOTE — Telephone Encounter (Signed)
Patient advising provider her home Covid test is negative as of 08-11-2021  Patient requesting a call back [phone]  *see below*

## 2021-08-11 NOTE — Telephone Encounter (Signed)
Patient informing provider her temperature at 3:09 pm was 99.7

## 2021-08-11 NOTE — Telephone Encounter (Signed)
Pt calling in requesting Televisit due to Sinus infection, sore throat, Karina Newman mucus, hoarseness. Pt is currently in [place]. Pt didn't take a COVID as of yet. Pt will call back with results of COVID test.  Please advise pt

## 2021-08-12 NOTE — Telephone Encounter (Signed)
Pls sch OV w/any provider  You can use over-the-counter  "cold" medicines  such as  "Afrin" nasal spray for nasal congestion as directed. Use " Delsym" or" Robitussin" cough syrup varietis for cough.  You can use plain "Tylenol" or "Advil" for fever, chills and achyness. Use Halls or Ricola cough drops.   "Common cold" symptoms are usually triggered by a virus.  The antibiotics are usually not necessary. On average, a" viral cold" illness would take 4-7 days to resolve.   Please, make an appointment if you are not better or if worse.  thx

## 2021-08-13 DIAGNOSIS — R059 Cough, unspecified: Secondary | ICD-10-CM | POA: Diagnosis not present

## 2021-08-13 DIAGNOSIS — Z20822 Contact with and (suspected) exposure to covid-19: Secondary | ICD-10-CM | POA: Diagnosis not present

## 2021-08-13 DIAGNOSIS — Z28310 Unvaccinated for covid-19: Secondary | ICD-10-CM | POA: Diagnosis not present

## 2021-08-18 ENCOUNTER — Telehealth: Payer: Medicare Other | Admitting: Family Medicine

## 2021-08-20 DIAGNOSIS — R059 Cough, unspecified: Secondary | ICD-10-CM | POA: Diagnosis not present

## 2021-08-20 NOTE — Progress Notes (Signed)
Canceled appt.

## 2021-08-24 DIAGNOSIS — R002 Palpitations: Secondary | ICD-10-CM | POA: Diagnosis not present

## 2021-08-27 ENCOUNTER — Telehealth: Payer: Self-pay | Admitting: Internal Medicine

## 2021-08-27 NOTE — Telephone Encounter (Signed)
Left message for patient to call back to schedule Medicare Annual Wellness Visit   No hx of AWV eligible as of 04/16/21  Please schedule at anytime with [facility] Advisor if patient calls the office back.    40 Minutes appointment   Any questions, please call me at [phone]

## 2021-09-01 ENCOUNTER — Other Ambulatory Visit: Payer: Self-pay | Admitting: Internal Medicine

## 2021-09-13 ENCOUNTER — Other Ambulatory Visit: Payer: Self-pay | Admitting: Internal Medicine

## 2021-09-22 ENCOUNTER — Other Ambulatory Visit: Payer: Self-pay

## 2021-09-22 ENCOUNTER — Ambulatory Visit (INDEPENDENT_AMBULATORY_CARE_PROVIDER_SITE_OTHER): Payer: Medicare Other | Admitting: Internal Medicine

## 2021-09-22 ENCOUNTER — Encounter: Payer: Self-pay | Admitting: Internal Medicine

## 2021-09-22 DIAGNOSIS — B9689 Other specified bacterial agents as the cause of diseases classified elsewhere: Secondary | ICD-10-CM

## 2021-09-22 DIAGNOSIS — S43431S Superior glenoid labrum lesion of right shoulder, sequela: Secondary | ICD-10-CM

## 2021-09-22 DIAGNOSIS — I10 Essential (primary) hypertension: Secondary | ICD-10-CM

## 2021-09-22 DIAGNOSIS — M25511 Pain in right shoulder: Secondary | ICD-10-CM

## 2021-09-22 DIAGNOSIS — E538 Deficiency of other specified B group vitamins: Secondary | ICD-10-CM | POA: Diagnosis not present

## 2021-09-22 DIAGNOSIS — J329 Chronic sinusitis, unspecified: Secondary | ICD-10-CM | POA: Diagnosis not present

## 2021-09-22 MED ORDER — OXYCODONE-ACETAMINOPHEN 5-325 MG PO TABS: 1.0000 | ORAL_TABLET | Freq: Three times a day (TID) | ORAL | 0 refills | Status: DC | PRN

## 2021-09-22 MED ORDER — CEFDINIR 300 MG PO CAPS: 300.0000 mg | ORAL_CAPSULE | Freq: Two times a day (BID) | ORAL | 0 refills | Status: DC

## 2021-09-22 NOTE — Assessment & Plan Note (Signed)
New Omnicef x 10 d  

## 2021-09-22 NOTE — Assessment & Plan Note (Signed)
Cont on B12 ?

## 2021-09-22 NOTE — Assessment & Plan Note (Signed)
Cont w/Amlodipine, Micardis HCT 

## 2021-09-22 NOTE — Assessment & Plan Note (Signed)
Worse Increase Percocet to tid  Potential benefits of a long term opioids use as well as potential risks (i.e. addiction risk, apnea etc) and complications (i.e. Somnolence, constipation and others) were explained to the patient and were aknowledged.

## 2021-09-22 NOTE — Progress Notes (Signed)
Subjective:  Patient ID: Karina Newman, female    DOB: Jun 15, 1956  Age: 66 y.o. MRN: [id number]  CC: Follow-up   HPI Karina Newman presents for chronic R shoulder pain - worse, HTN, low B12 f/u C/o URI sx's x 1 month.Karina Newman was in [place]: she had a CXR, gave her abx - not better  Outpatient Medications Prior to Visit  Medication Sig Dispense Refill   amLODipine (NORVASC) 5 MG tablet TAKE 1 TABLET (5 MG TOTAL) BY MOUTH DAILY. 90 tablet 3   aspirin EC 81 MG tablet Take 1 tablet (81 mg total) by mouth daily. 100 tablet 3   carvedilol (COREG) 12.5 MG tablet TAKE 1 TABLET BY MOUTH 2 TIMES DAILY. 60 tablet 5   Cyanocobalamin (VITAMIN B-12) 500 MCG SUBL 1 sl qd 100 tablet 5   cyclobenzaprine (FLEXERIL) 5 MG tablet TAKE 1 TABLET BY MOUTH THREE TIMES A DAY AS NEEDED FOR MUSCLE SPASMS 60 tablet 2   Diclofenac Sodium 1.5 % SOLN USE 15 DROPS THREE TIMES DAILY EXTERNALLY FOR PAIN 150 mL 5   dicyclomine (BENTYL) 10 MG capsule Take 1 capsule (10 mg total) by mouth every 8 (eight) hours as needed for spasms. 270 capsule 3   doxazosin (CARDURA) 4 MG tablet TAKE 1 TABLET BY MOUTH EVERY DAY 30 tablet 0   fluticasone (FLONASE) 50 MCG/ACT nasal spray Place 1 spray into both nostrils as needed. 48 mL 3   gabapentin (NEURONTIN) 100 MG capsule TAKE 1 CAPSULE BY MOUTH THREE TIMES A DAY AS NEEDED 90 capsule 3   LORazepam (ATIVAN) 0.5 MG tablet Take 1 tablet (0.5 mg total) by mouth 2 (two) times daily as needed for anxiety or sleep. 60 tablet 1   omeprazole (PRILOSEC) 40 MG capsule TAKE 1 CAPSULE BY MOUTH IN THE MORNING AND AT BEDTIME. 60 capsule 5   polyethylene glycol (MIRALAX) 17 g packet Take daily as directed. Increase or decrease as needed 14 each 0   potassium chloride (KLOR-CON) 10 MEQ tablet Take 1 tablet (10 mEq total) by mouth daily. Take 1 tablet (10 mEq total) by mouth daily. 90 tablet 3   sucralfate (CARAFATE) 1 g tablet TAKE 1 TABLET BY MOUTH 3 TIMES A DAY BETWEEN MEALS 270 tablet 3   telmisartan  (MICARDIS) 80 MG tablet Take 1 tablet (80 mg total) by mouth daily. 90 tablet 3   traZODone (DESYREL) 50 MG tablet TAKE 1 TABLET BY MOUTH EVERYDAY AT BEDTIME 90 tablet 3   valACYclovir (VALTREX) 500 MG tablet Take one tablet po BID x 3 days prn outbreak 30 tablet 1   Vitamin D, Cholecalciferol, 1000 units CAPS Take 2,000 Units by mouth.     vortioxetine HBr (TRINTELLIX) 5 MG TABS tablet Take 1 tablet (5 mg total) by mouth daily. 30 tablet 5   oxyCODONE-acetaminophen (PERCOCET/ROXICET) 5-325 MG tablet Take 1 tablet by mouth 2 (two) times daily as needed for severe pain. 60 tablet 0   No facility-administered medications prior to visit.    ROS: Review of Systems  Constitutional:  Negative for activity change, appetite change, chills, fatigue and unexpected weight change.  HENT:  Positive for congestion, postnasal drip, rhinorrhea and sinus pressure. Negative for ear pain (left) and mouth sores.   Eyes:  Negative for visual disturbance.  Respiratory:  Negative for cough and chest tightness.   Gastrointestinal:  Negative for abdominal pain and nausea.  Genitourinary:  Negative for difficulty urinating, frequency and vaginal pain.  Musculoskeletal:  Negative for back pain and gait problem.  Skin:  Negative for pallor and rash.  Neurological:  Negative for dizziness, tremors, weakness, numbness and headaches.  Psychiatric/Behavioral:  Negative for confusion and sleep disturbance.    Objective:  BP 128/70 (BP Location: Left Arm, Patient Position: Sitting, Cuff Size: Normal)    Pulse 84    Temp 98.1 F (36.7 C) (Oral)    Ht 5\' 4"  (1.626 m)    Wt 167 lb 9.6 oz (76 kg)    SpO2 99%    BMI 28.77 kg/m   BP Readings from Last 3 Encounters:  09/22/21 128/70  07/24/21 122/82  06/22/21 140/80    Wt Readings from Last 3 Encounters:  09/22/21 167 lb 9.6 oz (76 kg)  07/24/21 169 lb (76.7 kg)  06/22/21 167 lb (75.8 kg)    Physical Exam Constitutional:      General: She is not in acute  distress.    Appearance: She is well-developed.  HENT:     Head: Normocephalic.     Right Ear: External ear normal.     Left Ear: External ear normal.     Nose: Nose normal.  Eyes:     General:        Right eye: No discharge.        Left eye: No discharge.     Conjunctiva/sclera: Conjunctivae normal.     Pupils: Pupils are equal, round, and reactive to light.  Neck:     Thyroid: No thyromegaly.     Vascular: No JVD.     Trachea: No tracheal deviation.  Cardiovascular:     Rate and Rhythm: Normal rate and regular rhythm.     Heart sounds: Normal heart sounds.  Pulmonary:     Effort: No respiratory distress.     Breath sounds: No stridor. No wheezing.  Abdominal:     General: Bowel sounds are normal. There is no distension.     Palpations: Abdomen is soft. There is no mass.     Tenderness: There is no abdominal tenderness. There is no guarding or rebound.  Musculoskeletal:        General: Tenderness present.     Cervical back: Normal range of motion and neck supple. No rigidity.  Lymphadenopathy:     Cervical: No cervical adenopathy.  Skin:    Findings: No erythema or rash.  Neurological:     Mental Status: She is oriented to person, place, and time.     Cranial Nerves: No cranial nerve deficit.     Motor: No abnormal muscle tone.     Coordination: Coordination normal.     Deep Tendon Reflexes: Reflexes normal.  Psychiatric:        Behavior: Behavior normal.        Thought Content: Thought content normal.        Judgment: Judgment normal.   R shoulder w/pain, cerv pain  Eryth TM -  L side  Lab Results  Component Value Date   WBC 6.0 07/24/2021   HGB 13.2 07/24/2021   HCT 38.9 07/24/2021   PLT 326 07/24/2021   GLUCOSE 84 07/24/2021   CHOL 161 12/04/2020   TRIG 101.0 12/04/2020   HDL 40.20 12/04/2020   LDLCALC 101 (H) 12/04/2020   ALT 22 07/24/2021   AST 18 07/24/2021   NA 139 07/24/2021   K 3.8 07/24/2021   CL 100 07/24/2021   CREATININE 0.84 07/24/2021    BUN 10 07/24/2021   CO2 26 07/24/2021   TSH 0.590 07/24/2021    CT  ABDOMEN PELVIS WO CONTRAST  Result Date: 07/01/2021 CLINICAL DATA:  Worsening chronic abdominal pain.  Nausea. EXAM: CT ABDOMEN AND PELVIS WITHOUT CONTRAST TECHNIQUE: Multidetector CT imaging of the abdomen and pelvis was performed following the standard protocol without IV contrast. COMPARISON:  09/09/2016 FINDINGS: Lower chest: No acute findings. Hepatobiliary: Stable mild diffuse hepatic steatosis. No mass visualized on this unenhanced exam. Gallbladder is unremarkable. No evidence of biliary ductal dilatation. Pancreas: No mass or inflammatory process visualized on this unenhanced exam. Spleen:  Within normal limits in size. Adrenals/Urinary tract: No evidence of urolithiasis or hydronephrosis. Unremarkable unopacified urinary bladder. Stomach/Bowel: No evidence of obstruction, inflammatory process, or abnormal fluid collections. Normal appendix visualized. Vascular/Lymphatic: No pathologically enlarged lymph nodes identified. No evidence of abdominal aortic aneurysm. Reproductive: At least 1 small approximately 2 cm fibroid is seen in the right posterior fundal region. Adnexal regions are unremarkable. Other: Stable small bilateral inguinal hernias, which contain only fat. Musculoskeletal:  No suspicious bone lesions identified. IMPRESSION: No acute findings. Stable mild hepatic steatosis. Small uterine fibroid. Stable small bilateral inguinal hernias, which contain only fat. Electronically Signed   By: Marlaine Hind M.D.   On: 07/01/2021 13:29    Assessment & Plan:   Problem List Items Addressed This Visit     HTN (hypertension), benign    Cont w/Amlodipine, Micardis HCT      Labral tear of shoulder    Worse Increase Percocet to tid  Potential benefits of a long term opioids use as well as potential risks (i.e. addiction risk, apnea etc) and complications (i.e. Somnolence, constipation and others) were explained to the  patient and were aknowledged.      Low vitamin B12 level    Cont on B12      Pain in joint, shoulder region    Worse Increase Percocet to tid  Potential benefits of a long term opioids use as well as potential risks (i.e. addiction risk, apnea etc) and complications (i.e. Somnolence, constipation and others) were explained to the patient and were aknowledged.        Sinusitis, bacterial    New Omnicef x 10 d      Relevant Medications   cefdinir (OMNICEF) 300 MG capsule      Meds ordered this encounter  Medications   oxyCODONE-acetaminophen (PERCOCET/ROXICET) 5-325 MG tablet    Sig: Take 1 tablet by mouth every 8 (eight) hours as needed for severe pain.    Dispense:  90 tablet    Refill:  0    Please fill on or after 09/22/21   cefdinir (OMNICEF) 300 MG capsule    Sig: Take 1 capsule (300 mg total) by mouth 2 (two) times daily.    Dispense:  20 capsule    Refill:  0      Follow-up: Return in about 3 months (around 12/20/2021) for a follow-up visit.  Walker Kehr, MD

## 2021-09-28 ENCOUNTER — Telehealth (HOSPITAL_BASED_OUTPATIENT_CLINIC_OR_DEPARTMENT_OTHER): Payer: Self-pay

## 2021-09-28 NOTE — Telephone Encounter (Addendum)
Results called to patient who verbalized understanding. Patient asked when her next appointment was. RN told her 4/5 at 10:20 with Dr. Oval Linsey at the [facility]! Patient states she may have to change that, but will call the office back once she knows for sure!   ----- Message from Loel Dubonnet, NP sent at 09/28/2021  9:42 AM EST ----- Monitor shows predominantly NSR. There were occasional symptomatic PVCs (early beats in the bottom chamber of the heart). One short run of VT. No significant SVT or inappropriate sinus tachycardia. Good result, continue current dose Coreg.  Follow up as scheduled.

## 2021-10-07 ENCOUNTER — Other Ambulatory Visit: Payer: Self-pay | Admitting: Internal Medicine

## 2021-10-07 NOTE — Progress Notes (Signed)
Office Visit Note  Patient: Karina Newman             Date of Birth: [birth year]           MRN: [id number]             PCP: Cassandria Anger, MD Referring: Cassandria Anger, MD Visit Date: 10/21/2021 Occupation: @GUAROCC @  Subjective:  Pain in multiple joints   History of Present Illness: Karina Newman is a 66 y.o. female with history of osteoarthritis and DDD. She underwent visco gel injections in both knees in November 2022.  She noticed about 75% improvement in her knee joint pain after undergoing Visco gel injections.  She states that she continues to discomfort in her knee joints when climbing steps.  She denies any joint swelling in her knees at this time.  She continues to use Voltaren gel topically as needed for pain relief.  She has been experiencing increased pain and stiffness in both hands.  She states worsening in the mornings her hands feel tight and her rings are tight.  She states that for the past 1 month she has been experiencing significant discomfort in her right hip.  She denies any recent injury or fall.  At times the pain has been so severe that it brings tears to her eyes.  She has been taking Percocet as needed for pain relief.   Activities of Daily Living:  Patient reports morning stiffness for all day. Patient Reports nocturnal pain.  Difficulty dressing/grooming: Reports Difficulty climbing stairs: Reports Difficulty getting out of chair: Denies Difficulty using hands for taps, buttons, cutlery, and/or writing: Reports  Review of Systems  Constitutional:  Negative for fatigue.  HENT:  Positive for nose dryness. Negative for mouth sores and mouth dryness.   Eyes:  Positive for pain, itching and dryness.  Respiratory:  Positive for shortness of breath. Negative for difficulty breathing.   Cardiovascular:  Positive for palpitations. Negative for chest pain.  Gastrointestinal:  Negative for blood in stool, constipation and diarrhea.  Endocrine:  Negative for increased urination.  Genitourinary:  Negative for difficulty urinating.  Musculoskeletal:  Positive for joint pain, joint pain, joint swelling, myalgias, morning stiffness, muscle tenderness and myalgias.  Skin:  Negative for color change, rash and redness.  Allergic/Immunologic: Negative for susceptible to infections.  Neurological:  Positive for headaches and memory loss. Negative for dizziness, numbness and weakness.  Hematological:  Positive for bruising/bleeding tendency.  Psychiatric/Behavioral:  Negative for confusion.    PMFS History:  Patient Active Problem List   Diagnosis Date Noted   Toe pain, chronic, right 09/17/2020   Grief at loss of child 07/24/2020   Dysphagia 01/02/2020   Globus sensation 01/02/2020   Thoracic back pain 12/19/2019   Sore throat 10/24/2019   Goiter 10/24/2019   Cough 10/24/2019   Neoplasm of uncertain behavior of skin 09/27/2019   Pain of right breast 06/03/2019   Fatty liver 01/23/2019   Hand pain 09/28/2018   Knee pain 09/28/2018   Inappropriate sinus tachycardia 05/17/2018   Apnea 05/17/2018   Upper respiratory infection 09/23/2017   Fatigue 09/12/2017   Well adult exam 01/19/2017   Abdominal pain 01/19/2017   Headache 10/27/2016   Pain in right shoulder 09/16/2016   Left hip pain 09/16/2016   Low vitamin B12 level 03/16/2016   Piriformis syndrome of right side 02/04/2016   IT band syndrome 02/04/2016   RUQ abdominal pain 02/04/2016   Low back pain radiating to right lower extremity 09/19/2015  Allergic rhinitis 09/19/2015   Atypical chest pain 04/07/2015   GERD (gastroesophageal reflux disease) 04/07/2015   Axillary adenitis 03/23/2015   Migraine headache 03/23/2015   Hives 01/29/2015   Edema 01/29/2015   Cervical disc disorder with radiculopathy of cervical region 12/11/2014   Insomnia 07/31/2014   Degenerative cervical disc 05/08/2014   Pain in joint, shoulder region 03/05/2014   Patellofemoral arthralgia of  both knees 03/01/2014   Bilateral shoulder pain 10/18/2013   Sinusitis, bacterial 08/20/2013   URI, acute 08/14/2013   Hemoptysis 08/14/2013   Otitis media of left ear 08/14/2013   Labral tear of shoulder 07/03/2013   Hyperthyroidism 07/03/2013   HTN (hypertension), benign 07/03/2013   LVH (left ventricular hypertrophy) due to hypertensive disease 07/03/2013   Vitamin D deficiency 07/03/2013    Past Medical History:  Diagnosis Date   Abnormal cervical Pap smear with positive HPV DNA test 01/2014,01/2017   2015 Normal cytology with positive high-risk HPV 18/45. Colposcopy negative with negative ECC.  2018 normal cytology with positive high-risk HPV 18/45   Anemia    Apnea 05/17/2018   Autoimmune gastritis    Fatty liver    Gastritis    Heart murmur    Hypertension    Hyperthyroidism    Inappropriate sinus tachycardia 05/17/2018   Internal hemorrhoids    Osteoarthritis    Right shoulder pain 2001   chronic pain   Thyroid disease    Hyperthyroid. Was on PTU (Dr Hampton Abbot in [place]) - stopped in 2013, stable   Tubular adenoma of colon     Family History  Problem Relation Age of Onset   Kidney disease Mother        ESRD   Hypertension Mother    Lung cancer Father    Hypertension Sister    Kidney disease Sister    Hypertension Sister    Diabetes Brother    Hypertension Brother    Hypertension Brother    Diabetes Paternal Grandmother    Sleep apnea Son    Hypertension Son    Colon cancer Neg Hx    Stomach cancer Neg Hx    Esophageal cancer Neg Hx    Rectal cancer Neg Hx    Pancreatic cancer Neg Hx    Past Surgical History:  Procedure Laterality Date   BREAST CYST ASPIRATION Left 2012   CESAREAN SECTION     x 4    COLONOSCOPY     HAND SURGERY Left    SHOULDER SURGERY Right 2002   SHOULDER SURGERY Left 2014   TUBAL LIGATION     UPPER GASTROINTESTINAL ENDOSCOPY     Social History   Social History Narrative   Not on file   Immunization History  Administered Date(s)  Administered   Zoster Recombinat (Shingrix) 03/25/2017, 06/27/2017     Objective: Vital Signs: BP (!) 167/93 (BP Location: Left Arm, Patient Position: Sitting, Cuff Size: Normal)    Pulse 83    Ht 5\' 4"  (1.626 m)    Wt 171 lb 6.4 oz (77.7 kg)    BMI 29.42 kg/m    Physical Exam Vitals and nursing note reviewed.  Constitutional:      Appearance: She is well-developed.  HENT:     Head: Normocephalic and atraumatic.  Eyes:     Conjunctiva/sclera: Conjunctivae normal.  Cardiovascular:     Rate and Rhythm: Normal rate and regular rhythm.     Heart sounds: Normal heart sounds.  Pulmonary:     Effort: Pulmonary effort is normal.  Breath sounds: Normal breath sounds.  Abdominal:     General: Bowel sounds are normal.     Palpations: Abdomen is soft.  Musculoskeletal:     Cervical back: Normal range of motion.  Lymphadenopathy:     Cervical: No cervical adenopathy.  Skin:    General: Skin is warm and dry.     Capillary Refill: Capillary refill takes less than 2 seconds.  Neurological:     Mental Status: She is alert and oriented to person, place, and time.  Psychiatric:        Behavior: Behavior normal.     Musculoskeletal Exam: C-spine, thoracic spine, and lumbar spine good ROM.  No midline spinal tenderness.  No SI joint tenderness.  Painful ROM of both shoulder joints.  Limited internal rotation of the right shoulder.  Elbow joints, wrist joints, MCPS, PIPs, and DIPs good ROM with no synovitis.  Tenderness over both CMC joints, right 1st MCP and PIP and DIP of the right index finger.  Complete fist formation bilaterally.  Right hip has painful and limited ROM.  No tenderness over trochanteric bursa.  Knee joints have good ROM with no warmth or effusion.  Ankle joints have good ROM with no tenderness or joint swelling.   CDAI Exam: CDAI Score: -- Patient Global: --; Provider Global: -- Swollen: 0 ; Tender: 6  Joint Exam 10/21/2021      Right  Left  CMC   Tender   Tender  MCP  1   Tender     IP   Tender     DIP 2   Tender     Hip   Tender        Investigation: No additional findings.  Imaging: No results found.  Recent Labs: Lab Results  Component Value Date   WBC 6.0 07/24/2021   HGB 13.2 07/24/2021   PLT 326 07/24/2021   NA 139 07/24/2021   K 3.8 07/24/2021   CL 100 07/24/2021   CO2 26 07/24/2021   GLUCOSE 84 07/24/2021   BUN 10 07/24/2021   CREATININE 0.84 07/24/2021   BILITOT 0.3 07/24/2021   ALKPHOS 95 07/24/2021   AST 18 07/24/2021   ALT 22 07/24/2021   PROT 7.6 07/24/2021   ALBUMIN 4.4 07/24/2021   CALCIUM 9.5 07/24/2021   GFRAA 78 06/20/2018    Speciality Comments: No specialty comments available.  Procedures:  No procedures performed Allergies: Penicillins, Tramadol, Gabapentin, Iodine, Lexapro [escitalopram], Lyrica [pregabalin], Nsaids, and Voltaren [diclofenac]   Assessment / Plan:     Visit Diagnoses: Primary osteoarthritis of both knees - S/p synvisc bilateral knee injections 06/2021.  She has good range of motion of both knee joints on examination today.  No warmth or effusion was noted.  She has noticed about a 75% improvement in her knee joint pain since undergoing Visco gel injections.  She uses voltaren gel topically as needed for pain relief.  Discussed the importance of joint protection and muscle strengthening.   Pain in both hands - She presents today with increased pain and stiffness in both hands. She has tenderness to palpation over both CMC joints and the right 1st MCP and PIP joint.  No synovitis was noted. X-rays of both hands were updated today along with the following lab work for further evaluation.  Plan: XR Hand 2 View Right, XR Hand 2 View Left, 14-3-3 eta Protein, Cyclic citrul peptide antibody, IgG, Rheumatoid factor, Sedimentation rate, ANA, C-reactive protein  Chronic right shoulder pain - Patient was  evaluated by Dr. Durward Fortes and had a subacromial cortisone injection. Workers-comp case. Improved.  She  has limited range of motion especially with internal rotation on examination. She is taking percocet for pain relief.   Pain in right hip - She presents today with severe pain in the right hip pain which has progressively been worsening over the past 1 month.  She has not had any injury or fall prior to the onset of symptoms.  At times the pain has been so severe it brings her to tears.  She has been taking Percocet as needed for pain relief and has tried using Voltaren gel topically.  On examination she has painful limited range of motion of the right hip joint.  She was able to ambulate without difficulty into the office but was having difficulty when leaving the office.  X-rays of the right hip were obtained today for further evaluation.  Results were discussed with the patient today in the office.  An urgent evaluation to Dr. Marlou Sa was placed today per request of the patient.  Discussed that she may require an MRI of the right hip for further evaluation.  The following lab work will also be obtained today.  She plans on continuing to take Percocet as needed for pain relief until she sees Dr. Marlou Sa.  Plan: XR HIP UNILAT W OR W/O PELVIS 2-3 VIEWS RIGHT  Trochanteric bursitis of both hips: She has no tenderness to palpation over both trochanteric bursa on exam today.   Plantar fasciitis, bilateral - Previously diagnosed with plantar fasciitis of both feet by Dr. Cannon Kettle.  She is wearing proper fitting shoes.   DDD (degenerative disc disease), cervical: She has good ROM of the C-spine with no discomfort.   Other medical conditions are listed as follows:   Vitreous floaters of right eye - She was diagnosed with floaters in her eyes and July per patient.   Vitamin D deficiency  HTN (hypertension), benign  Hypertensive left ventricular hypertrophy, without heart failure  Fatty liver  Tubular adenoma of colon  Other insomnia -She takes trazodone 50 mg 1 tablet by mouth at bedtime for  insomnia.  History of gastroesophageal reflux (GERD)  Hyperthyroidism    Orders: Orders Placed This Encounter  Procedures   XR Hand 2 View Right   XR Hand 2 View Left   XR HIP UNILAT W OR W/O PELVIS 2-3 VIEWS RIGHT   14-3-3 eta Protein   Cyclic citrul peptide antibody, IgG   Rheumatoid factor   Sedimentation rate   ANA   C-reactive protein   Serum protein electrophoresis with reflex   No orders of the defined types were placed in this encounter.   Follow-Up Instructions: Return in 6 months (on 04/23/2022) for Osteoarthritis, DDD.   Ofilia Neas, PA-C  Note - This record has been created using Dragon software.  Chart creation errors have been sought, but may not always  have been located. Such creation errors do not reflect on  the standard of medical care.

## 2021-10-12 ENCOUNTER — Other Ambulatory Visit: Payer: Self-pay | Admitting: Internal Medicine

## 2021-10-21 ENCOUNTER — Ambulatory Visit: Payer: Self-pay

## 2021-10-21 ENCOUNTER — Other Ambulatory Visit: Payer: Self-pay

## 2021-10-21 ENCOUNTER — Encounter: Payer: Self-pay | Admitting: Physician Assistant

## 2021-10-21 ENCOUNTER — Ambulatory Visit: Payer: Medicare Other | Admitting: Physician Assistant

## 2021-10-21 VITALS — BP 167/93 | HR 83 | Ht 64.0 in | Wt 171.4 lb

## 2021-10-21 DIAGNOSIS — D126 Benign neoplasm of colon, unspecified: Secondary | ICD-10-CM | POA: Diagnosis not present

## 2021-10-21 DIAGNOSIS — K760 Fatty (change of) liver, not elsewhere classified: Secondary | ICD-10-CM

## 2021-10-21 DIAGNOSIS — M25511 Pain in right shoulder: Secondary | ICD-10-CM | POA: Diagnosis not present

## 2021-10-21 DIAGNOSIS — E0590 Thyrotoxicosis, unspecified without thyrotoxic crisis or storm: Secondary | ICD-10-CM

## 2021-10-21 DIAGNOSIS — I10 Essential (primary) hypertension: Secondary | ICD-10-CM

## 2021-10-21 DIAGNOSIS — G8929 Other chronic pain: Secondary | ICD-10-CM

## 2021-10-21 DIAGNOSIS — M170 Bilateral primary osteoarthritis of knee: Secondary | ICD-10-CM

## 2021-10-21 DIAGNOSIS — M79641 Pain in right hand: Secondary | ICD-10-CM

## 2021-10-21 DIAGNOSIS — M722 Plantar fascial fibromatosis: Secondary | ICD-10-CM | POA: Diagnosis not present

## 2021-10-21 DIAGNOSIS — M79642 Pain in left hand: Secondary | ICD-10-CM

## 2021-10-21 DIAGNOSIS — M7062 Trochanteric bursitis, left hip: Secondary | ICD-10-CM

## 2021-10-21 DIAGNOSIS — M5030 Other cervical disc degeneration, unspecified cervical region: Secondary | ICD-10-CM

## 2021-10-21 DIAGNOSIS — M25551 Pain in right hip: Secondary | ICD-10-CM | POA: Diagnosis not present

## 2021-10-21 DIAGNOSIS — Z8719 Personal history of other diseases of the digestive system: Secondary | ICD-10-CM

## 2021-10-21 DIAGNOSIS — E559 Vitamin D deficiency, unspecified: Secondary | ICD-10-CM

## 2021-10-21 DIAGNOSIS — M7061 Trochanteric bursitis, right hip: Secondary | ICD-10-CM | POA: Diagnosis not present

## 2021-10-21 DIAGNOSIS — M2550 Pain in unspecified joint: Secondary | ICD-10-CM | POA: Diagnosis not present

## 2021-10-21 DIAGNOSIS — G4709 Other insomnia: Secondary | ICD-10-CM

## 2021-10-21 DIAGNOSIS — H43391 Other vitreous opacities, right eye: Secondary | ICD-10-CM | POA: Diagnosis not present

## 2021-10-21 DIAGNOSIS — I119 Hypertensive heart disease without heart failure: Secondary | ICD-10-CM

## 2021-10-21 NOTE — Progress Notes (Signed)
X-rays of both hands are consistent with osteoarthritis.  No erosive changes noted. ?X-rays of the right hip revealed spurring of the acetabulum. Please notify the patient.  ? ?She will require an urgent evaluation of the right hip with Dr. Marlou Sa due to the severity of pain.  Please check availability.

## 2021-10-22 NOTE — Progress Notes (Signed)
RF negative.  ESR and CRP WNL

## 2021-10-23 NOTE — Progress Notes (Signed)
Anti-CCP is a weak positive-21.

## 2021-10-24 ENCOUNTER — Other Ambulatory Visit: Payer: Self-pay | Admitting: Internal Medicine

## 2021-10-26 DIAGNOSIS — R490 Dysphonia: Secondary | ICD-10-CM | POA: Diagnosis not present

## 2021-10-26 DIAGNOSIS — R0989 Other specified symptoms and signs involving the circulatory and respiratory systems: Secondary | ICD-10-CM | POA: Diagnosis not present

## 2021-10-26 DIAGNOSIS — J302 Other seasonal allergic rhinitis: Secondary | ICD-10-CM | POA: Diagnosis not present

## 2021-10-26 NOTE — Progress Notes (Signed)
SPEP did not reveal any abnormal proteins.   ?ANA is positive-low titer.  ?14-3-3 eta is pending.

## 2021-10-27 LAB — CYCLIC CITRUL PEPTIDE ANTIBODY, IGG: Cyclic Citrullin Peptide Ab: 21 UNITS — ABNORMAL HIGH

## 2021-10-27 LAB — PROTEIN ELECTROPHORESIS, SERUM, WITH REFLEX
Albumin ELP: 4.3 g/dL (ref 3.8–4.8)
Alpha 1: 0.3 g/dL (ref 0.2–0.3)
Alpha 2: 0.8 g/dL (ref 0.5–0.9)
Beta 2: 0.4 g/dL (ref 0.2–0.5)
Beta Globulin: 0.4 g/dL (ref 0.4–0.6)
Gamma Globulin: 1.5 g/dL (ref 0.8–1.7)
Total Protein: 7.7 g/dL (ref 6.1–8.1)

## 2021-10-27 LAB — ANTI-NUCLEAR AB-TITER (ANA TITER)
ANA TITER: 1:40 {titer} — ABNORMAL HIGH
ANA Titer 1: 1:40 {titer} — ABNORMAL HIGH

## 2021-10-27 LAB — 14-3-3 ETA PROTEIN: 14-3-3 eta Protein: <0.2 ng/mL (ref <0.2)

## 2021-10-27 LAB — C-REACTIVE PROTEIN: CRP: 3.1 mg/L (ref <8.0)

## 2021-10-27 LAB — SEDIMENTATION RATE: Sed Rate: 19 mm/h (ref 0–30)

## 2021-10-27 LAB — ANA: Anti Nuclear Antibody (ANA): POSITIVE — AB

## 2021-10-27 LAB — RHEUMATOID FACTOR: Rheumatoid fact SerPl-aCnc: <14 IU/mL (ref <14)

## 2021-10-27 NOTE — Progress Notes (Signed)
14-3-3 eta negative.  ?If the patient continues to have ongoing pain in both hands I would recommend scheduling an ultrasound of both hands to assess for synovitis.

## 2021-11-04 ENCOUNTER — Other Ambulatory Visit: Payer: Self-pay | Admitting: Internal Medicine

## 2021-11-18 ENCOUNTER — Ambulatory Visit (HOSPITAL_BASED_OUTPATIENT_CLINIC_OR_DEPARTMENT_OTHER): Payer: Medicare Other | Admitting: Cardiovascular Disease

## 2021-11-19 ENCOUNTER — Other Ambulatory Visit: Payer: Self-pay | Admitting: Internal Medicine

## 2021-12-07 ENCOUNTER — Encounter (HOSPITAL_BASED_OUTPATIENT_CLINIC_OR_DEPARTMENT_OTHER): Payer: Self-pay | Admitting: Cardiovascular Disease

## 2021-12-07 ENCOUNTER — Ambulatory Visit (HOSPITAL_BASED_OUTPATIENT_CLINIC_OR_DEPARTMENT_OTHER): Payer: Medicare Other | Admitting: Cardiovascular Disease

## 2021-12-07 DIAGNOSIS — R000 Tachycardia, unspecified: Secondary | ICD-10-CM

## 2021-12-07 DIAGNOSIS — I10 Essential (primary) hypertension: Secondary | ICD-10-CM | POA: Diagnosis not present

## 2021-12-07 DIAGNOSIS — I4711 Inappropriate sinus tachycardia, so stated: Secondary | ICD-10-CM

## 2021-12-07 NOTE — Assessment & Plan Note (Signed)
Maintaining sinus rhythm.  Continue carvedilol. ?

## 2021-12-07 NOTE — Progress Notes (Signed)
? ? ?Cardiology Office Note ? ? ?Date:  12/07/2021  ? ?ID:  Karina Newman, DOB Nov 09, 1955, MRN [id number] ? ?PCP:  Cassandria Anger, MD  ?Cardiologist:   Skeet Latch, MD  ? ?No chief complaint on file. ? ? ?  ?History of Present Illness: ?Karina Newman is a 66 y.o. female with hypertension, autoimmune gastritis, and hyperthyroidism here for follow up.  She was initially seen for the evaluation of chest pain and abnormal EKG.  Ms. Kalka saw Dr. Alain Marion 04/2018 and reported L sided chest pain.  EKG at that time revealed sinus rhythm with LVH and nonspecific T wave abnormality's.  Ms. Heavrin reported intermittent L sided chest pain that felt like spasm.  She was referred for an exercise Myoview 05/2018 that revealed LVEF 54% with 1 mm ST depression with exertion.  Perfusion imaging showed a small, mild defect in the basal to mid anteroseptal and apical septal regions.  It was unclear whether this represented breast attenuation artifact or ischemia.  TID was 1.2.  She was referred for coronary CT-A 06/2018 that showed no CAD.  Coronary calcium score was 0.   ? ?Ms. Strole's blood pressure remained elevated.  Doxazosin was added to her regimen. She was doing well and reported her PCP had increased her gabapentin. At her last visit, she was grieving the loss of her son who passed away from hypertrophic cardiomyopathy. One of her daughters was also found to have mild cardiomyopathy. She reported continued shortness of breath and chest pain. Echo revealed LVEF 50-55% with a small pericardial effusion and no LVH. 12-day monitor showed predominately sinus rhythm with occasional symptomatic PVCs and 6 beats NSVT. Her blood pressure was well-controlled at home.  ? ?Today, she is doing well but is currently stressed with family concerns. Occasionally, she feels a pinch-like pain in her chest. When exercising, she becomes tired quickly. She endorses shortness of breath that was believed to be allergy related and  improved with Flonase. She does not have the motivation to exercise regularly. She reports her depressive symptoms has been improving. However, she continues to feel constantly fatigued. At home, her blood pressure has been elevated because of her joint pain. Her R hip pain in particular can prevent her from walking. For supplements, she takes vitamins B and D. She is planning to go on a [place] cruise. She denies any palpitations, lightheadedness, headaches, syncope, orthopnea, PND, or lower extremity edema. ? ?Past Medical History:  ?Diagnosis Date  ? Abnormal cervical Pap smear with positive HPV DNA test 01/2014,01/2017  ? 2015 Normal cytology with positive high-risk HPV 18/45. Colposcopy negative with negative ECC.  2018 normal cytology with positive high-risk HPV 18/45  ? Anemia   ? Apnea 05/17/2018  ? Autoimmune gastritis   ? Fatty liver   ? Gastritis   ? Heart murmur   ? Hypertension   ? Hyperthyroidism   ? Inappropriate sinus tachycardia 05/17/2018  ? Internal hemorrhoids   ? Osteoarthritis   ? Right shoulder pain 2001  ? chronic pain  ? Thyroid disease   ? Hyperthyroid. Was on PTU (Dr Hampton Abbot in [place]) - stopped in 2013, stable  ? Tubular adenoma of colon   ? ? ?Past Surgical History:  ?Procedure Laterality Date  ? BREAST CYST ASPIRATION Left 2012  ? CESAREAN SECTION    ? x 4   ? COLONOSCOPY    ? HAND SURGERY Left   ? SHOULDER SURGERY Right 2002  ? SHOULDER SURGERY Left 2014  ? TUBAL  LIGATION    ? UPPER GASTROINTESTINAL ENDOSCOPY    ? ? ? ?Current Outpatient Medications  ?Medication Sig Dispense Refill  ? amLODipine (NORVASC) 5 MG tablet TAKE 1 TABLET (5 MG TOTAL) BY MOUTH DAILY. 90 tablet 3  ? carvedilol (COREG) 12.5 MG tablet TAKE 1 TABLET BY MOUTH 2 TIMES DAILY. 60 tablet 5  ? Cyanocobalamin (VITAMIN B-12) 500 MCG SUBL 1 sl qd 100 tablet 5  ? cyclobenzaprine (FLEXERIL) 5 MG tablet TAKE 1 TABLET BY MOUTH THREE TIMES A DAY AS NEEDED FOR MUSCLE SPASMS 60 tablet 2  ? Diclofenac Sodium 1.5 % SOLN USE 15 DROPS THREE  TIMES DAILY EXTERNALLY FOR PAIN 150 mL 5  ? dicyclomine (BENTYL) 10 MG capsule TAKE 1 CAPSULE (10 MG TOTAL) BY MOUTH EVERY 8 (EIGHT) HOURS AS NEEDED FOR SPASMS. 270 capsule 3  ? doxazosin (CARDURA) 4 MG tablet TAKE 1 TABLET BY MOUTH EVERY DAY 90 tablet 1  ? fluticasone (FLONASE) 50 MCG/ACT nasal spray Place 1 spray into both nostrils as needed. 48 mL 3  ? gabapentin (NEURONTIN) 100 MG capsule TAKE 1 CAPSULE BY MOUTH THREE TIMES A DAY AS NEEDED 90 capsule 3  ? LORazepam (ATIVAN) 0.5 MG tablet Take 1 tablet (0.5 mg total) by mouth 2 (two) times daily as needed for anxiety or sleep. 60 tablet 1  ? omeprazole (PRILOSEC) 40 MG capsule TAKE 1 CAPSULE BY MOUTH IN THE MORNING AND AT BEDTIME. 60 capsule 5  ? oxyCODONE-acetaminophen (PERCOCET/ROXICET) 5-325 MG tablet Take 1 tablet by mouth every 8 (eight) hours as needed for severe pain. 90 tablet 0  ? polyethylene glycol (MIRALAX) 17 g packet Take daily as directed. Increase or decrease as needed 14 each 0  ? potassium chloride (KLOR-CON) 10 MEQ tablet Take 1 tablet (10 mEq total) by mouth daily. Take 1 tablet (10 mEq total) by mouth daily. 90 tablet 3  ? sucralfate (CARAFATE) 1 g tablet TAKE 1 TABLET BY MOUTH 3 TIMES A DAY BETWEEN MEALS 270 tablet 3  ? telmisartan (MICARDIS) 80 MG tablet Take 1 tablet (80 mg total) by mouth daily. 90 tablet 3  ? traZODone (DESYREL) 50 MG tablet TAKE 1 TABLET BY MOUTH EVERYDAY AT BEDTIME 90 tablet 3  ? valACYclovir (VALTREX) 500 MG tablet Take one tablet po BID x 3 days prn outbreak 30 tablet 1  ? Vitamin D, Cholecalciferol, 1000 units CAPS Take 2,000 Units by mouth.    ? vortioxetine HBr (TRINTELLIX) 5 MG TABS tablet Take 1 tablet (5 mg total) by mouth daily. (Patient not taking: Reported on 10/21/2021) 30 tablet 5  ? ?No current facility-administered medications for this visit.  ? ? ?Allergies:   Penicillins, Tramadol, Gabapentin, Iodine, Lexapro [escitalopram], Lyrica [pregabalin], Nsaids, and Voltaren [diclofenac]  ? ? ?Social History:   The patient  reports that she has never smoked. She has never been exposed to tobacco smoke. She has never used smokeless tobacco. She reports current alcohol use. She reports that she does not use drugs.  ? ?Family History:  The patient's family history includes Diabetes in her brother and paternal grandmother; Hypertension in her brother, brother, mother, sister, sister, and son; Kidney disease in her mother and sister; Lung cancer in her father; Sleep apnea in her son.  ? ? ?ROS:   ?Please see the history of present illness. ?(+) Stress ?(+) Chest pain  ?(+) Low exercise stamina ?(+) Shortness of breath ?(+) Depression symptoms ?(+) Fatigue/Malaise ?(+) Arthralgia (especially R hip) ?All other systems are reviewed and negative.  ? ? ?  PHYSICAL EXAM: ?VS:  BP (!) 146/84 (BP Location: Left Arm, Patient Position: Sitting, Cuff Size: Normal)   Pulse 70   Ht '5\' 4"'$  (1.626 m)   Wt 176 lb 1.6 oz (79.9 kg)   SpO2 98%   BMI 30.23 kg/m?  , BMI Body mass index is 30.23 kg/m?. ?GENERAL:  Well appearing ?HEENT: Pupils equal round and reactive, fundi not visualized, oral mucosa unremarkable ?NECK:  No jugular venous distention, waveform within normal limits, carotid upstroke brisk and symmetric, no bruits, no thyromegaly ?LUNGS:  Clear to auscultation bilaterally ?HEART:  RRR.  PMI not displaced or sustained,S1 and S2 within normal limits, no S3, no S4, no clicks, no rubs, no murmurs ?ABD:  Flat, positive bowel sounds normal in frequency in pitch, no bruits, no rebound, no guarding, no midline pulsatile mass, no hepatomegaly, no splenomegaly ?EXT:  2 plus pulses throughout, no edema, no cyanosis no clubbing ?SKIN:  No rashes no nodules ?NEURO:  Cranial nerves II through XII grossly intact, motor grossly intact throughout ?PSYCH:  Cognitively intact, oriented to person place and time ? ? ?EKG: EKG was not ordered today ?04/17/2021: Sinus rhythm. Rate 73 bpm. LVH. ?04/18/18: sinus rhythm.  Rate 81 bpm. Non-specific ST changes.   ? ?Monitor 09/15/21 ?12 day Zio Monitor ?  ?Quality: Fair.  Baseline artifact. ?Predominant rhythm: Sinus rhythm ?Average heart rate: 74 bpm ?Max heart rate: 150 bpm ?Min heart rate: 49 bpm ?Pauses >2.5 seconds: non

## 2021-12-07 NOTE — Assessment & Plan Note (Signed)
Blood pressure was elevated today and even higher on repeat.  She thinks that this is due to pain.  She is going to work on increasing her exercise to at least 150 minutes weekly.  Continue amlodipine, carvedilol, telmisartan and doxazosin.   ?

## 2021-12-07 NOTE — Patient Instructions (Signed)
Medication Instructions:  ?Your physician recommends that you continue on your current medications as directed. Please refer to the Current Medication list given to you today. ?  ?*If you need a refill on your cardiac medications before your next appointment, please call your pharmacy* ? ?Lab Work: ?NONE ? ?Testing/Procedures: ?NONE ? ?Follow-Up: ?At [facility] you and your health needs are our priority.  As part of our continuing mission to provide you with exceptional heart care, we have created designated Provider Care Teams.  These Care Teams include your primary Cardiologist (physician) and Advanced Practice Providers (APPs -  Physician Assistants and Nurse Practitioners) who all work together to provide you with the care you need, when you need it. ? ?We recommend signing up for the patient portal called "MyChart".  Sign up information is provided on this After Visit Summary.  MyChart is used to connect with patients for Virtual Visits (Telemedicine).  Patients are able to view lab/test results, encounter notes, upcoming appointments, etc.  Non-urgent messages can be sent to your provider as well.   ?To learn more about what you can do with MyChart, go to [email or web address].   ? ?Your next appointment:   ?4 month(s) ? ?The format for your next appointment:   ?In Person ? ?Provider:   ?Skeet Latch, MD  ? ? ?Other Instructions ?Exercise recommendations: ?The American Heart Association recommends 150 minutes of moderate intensity exercise weekly. ?Try 30 minutes of moderate intensity exercise 4-5 times per week. ?This could include walking, jogging, or swimming. ? ? ?

## 2021-12-21 ENCOUNTER — Ambulatory Visit (INDEPENDENT_AMBULATORY_CARE_PROVIDER_SITE_OTHER): Payer: Medicare Other | Admitting: Internal Medicine

## 2021-12-21 ENCOUNTER — Encounter: Payer: Self-pay | Admitting: Internal Medicine

## 2021-12-21 DIAGNOSIS — G4709 Other insomnia: Secondary | ICD-10-CM

## 2021-12-21 DIAGNOSIS — I10 Essential (primary) hypertension: Secondary | ICD-10-CM

## 2021-12-21 DIAGNOSIS — M25511 Pain in right shoulder: Secondary | ICD-10-CM | POA: Diagnosis not present

## 2021-12-21 DIAGNOSIS — E538 Deficiency of other specified B group vitamins: Secondary | ICD-10-CM | POA: Diagnosis not present

## 2021-12-21 DIAGNOSIS — M79604 Pain in right leg: Secondary | ICD-10-CM

## 2021-12-21 DIAGNOSIS — M545 Low back pain, unspecified: Secondary | ICD-10-CM | POA: Diagnosis not present

## 2021-12-21 MED ORDER — DICLOFENAC SODIUM 1.5 % EX SOLN: CUTANEOUS | 5 refills | Status: DC

## 2021-12-21 MED ORDER — GABAPENTIN 100 MG PO CAPS: ORAL_CAPSULE | ORAL | 3 refills | Status: DC

## 2021-12-21 MED ORDER — TRAZODONE HCL 50 MG PO TABS: ORAL_TABLET | ORAL | 3 refills | Status: DC

## 2021-12-21 MED ORDER — OXYCODONE-ACETAMINOPHEN 5-325 MG PO TABS: 1.0000 | ORAL_TABLET | Freq: Three times a day (TID) | ORAL | 0 refills | Status: DC | PRN

## 2021-12-21 MED ORDER — CARVEDILOL 12.5 MG PO TABS: 12.5000 mg | ORAL_TABLET | Freq: Two times a day (BID) | ORAL | 3 refills | Status: DC

## 2021-12-21 NOTE — Progress Notes (Signed)
? ?Subjective:  ?Patient ID: Karina Newman, female    DOB: February 23, 1956  Age: 66 y.o. MRN: [id number] ? ?CC: No chief complaint on file. ? ? ?HPI ?Karina Newman presents for chronic pain. C/p pain in hands ?C/o pain all over. F/u on B12 def, HTN ?Planning to go to [place] in 9/23 ? ?Outpatient Medications Prior to Visit  ?Medication Sig Dispense Refill  ? amLODipine (NORVASC) 5 MG tablet TAKE 1 TABLET (5 MG TOTAL) BY MOUTH DAILY. 90 tablet 3  ? Cyanocobalamin (VITAMIN B-12) 500 MCG SUBL 1 sl qd 100 tablet 5  ? cyclobenzaprine (FLEXERIL) 5 MG tablet TAKE 1 TABLET BY MOUTH THREE TIMES A DAY AS NEEDED FOR MUSCLE SPASMS 60 tablet 2  ? dicyclomine (BENTYL) 10 MG capsule TAKE 1 CAPSULE (10 MG TOTAL) BY MOUTH EVERY 8 (EIGHT) HOURS AS NEEDED FOR SPASMS. 270 capsule 3  ? doxazosin (CARDURA) 4 MG tablet TAKE 1 TABLET BY MOUTH EVERY DAY 90 tablet 1  ? fluticasone (FLONASE) 50 MCG/ACT nasal spray Place 1 spray into both nostrils as needed. 48 mL 3  ? LORazepam (ATIVAN) 0.5 MG tablet Take 1 tablet (0.5 mg total) by mouth 2 (two) times daily as needed for anxiety or sleep. 60 tablet 1  ? omeprazole (PRILOSEC) 40 MG capsule TAKE 1 CAPSULE BY MOUTH IN THE MORNING AND AT BEDTIME. 60 capsule 5  ? polyethylene glycol (MIRALAX) 17 g packet Take daily as directed. Increase or decrease as needed 14 each 0  ? potassium chloride (KLOR-CON) 10 MEQ tablet Take 1 tablet (10 mEq total) by mouth daily. Take 1 tablet (10 mEq total) by mouth daily. 90 tablet 3  ? sucralfate (CARAFATE) 1 g tablet TAKE 1 TABLET BY MOUTH 3 TIMES A DAY BETWEEN MEALS 270 tablet 3  ? telmisartan (MICARDIS) 80 MG tablet Take 1 tablet (80 mg total) by mouth daily. 90 tablet 3  ? valACYclovir (VALTREX) 500 MG tablet Take one tablet po BID x 3 days prn outbreak 30 tablet 1  ? Vitamin D, Cholecalciferol, 1000 units CAPS Take 2,000 Units by mouth.    ? carvedilol (COREG) 12.5 MG tablet TAKE 1 TABLET BY MOUTH 2 TIMES DAILY. 60 tablet 5  ? Diclofenac Sodium 1.5 % SOLN USE 15  DROPS THREE TIMES DAILY EXTERNALLY FOR PAIN 150 mL 5  ? gabapentin (NEURONTIN) 100 MG capsule TAKE 1 CAPSULE BY MOUTH THREE TIMES A DAY AS NEEDED 90 capsule 3  ? oxyCODONE-acetaminophen (PERCOCET/ROXICET) 5-325 MG tablet Take 1 tablet by mouth every 8 (eight) hours as needed for severe pain. 90 tablet 0  ? traZODone (DESYREL) 50 MG tablet TAKE 1 TABLET BY MOUTH EVERYDAY AT BEDTIME 90 tablet 3  ? vortioxetine HBr (TRINTELLIX) 5 MG TABS tablet Take 1 tablet (5 mg total) by mouth daily. (Patient not taking: Reported on 10/21/2021) 30 tablet 5  ? ?No facility-administered medications prior to visit.  ? ? ?ROS: ?Review of Systems  ?Constitutional:  Negative for activity change, appetite change, chills, fatigue and unexpected weight change.  ?HENT:  Negative for congestion, mouth sores and sinus pressure.   ?Eyes:  Negative for visual disturbance.  ?Respiratory:  Negative for cough, chest tightness and shortness of breath.   ?Cardiovascular:  Negative for leg swelling.  ?Gastrointestinal:  Negative for abdominal pain and nausea.  ?Genitourinary:  Negative for difficulty urinating, flank pain, frequency and vaginal pain.  ?Musculoskeletal:  Positive for arthralgias, back pain and neck pain. Negative for gait problem.  ?Skin:  Negative for pallor and rash.  ?  Neurological:  Negative for dizziness, tremors, weakness, numbness and headaches.  ?Psychiatric/Behavioral:  Negative for confusion and sleep disturbance.   ? ?Objective:  ?BP 140/90 (BP Location: Right Arm, Patient Position: Sitting, Cuff Size: Normal)   Pulse 92   Temp 98.7 ?F (37.1 ?C) (Oral)   Ht '5\' 4"'$  (1.626 m)   Wt 174 lb (78.9 kg)   SpO2 93%   BMI 29.87 kg/m?  ? ?BP Readings from Last 3 Encounters:  ?12/21/21 140/90  ?12/07/21 (!) 146/84  ?10/21/21 (!) 167/93  ? ? ?Wt Readings from Last 3 Encounters:  ?12/21/21 174 lb (78.9 kg)  ?12/07/21 176 lb 1.6 oz (79.9 kg)  ?10/21/21 171 lb 6.4 oz (77.7 kg)  ? ? ?Physical Exam ?Constitutional:   ?   General: She is not  in acute distress. ?   Appearance: She is well-developed. She is obese.  ?HENT:  ?   Head: Normocephalic.  ?   Right Ear: External ear normal.  ?   Left Ear: External ear normal.  ?   Nose: Nose normal.  ?Eyes:  ?   General:     ?   Right eye: No discharge.     ?   Left eye: No discharge.  ?   Conjunctiva/sclera: Conjunctivae normal.  ?   Pupils: Pupils are equal, round, and reactive to light.  ?Neck:  ?   Thyroid: No thyromegaly.  ?   Vascular: No JVD.  ?   Trachea: No tracheal deviation.  ?Cardiovascular:  ?   Rate and Rhythm: Normal rate and regular rhythm.  ?   Heart sounds: Normal heart sounds.  ?Pulmonary:  ?   Effort: No respiratory distress.  ?   Breath sounds: No stridor. No wheezing.  ?Abdominal:  ?   General: Bowel sounds are normal. There is no distension.  ?   Palpations: Abdomen is soft. There is no mass.  ?   Tenderness: There is no abdominal tenderness. There is no guarding or rebound.  ?Musculoskeletal:     ?   General: Tenderness present.  ?   Cervical back: Normal range of motion and neck supple. No rigidity.  ?Lymphadenopathy:  ?   Cervical: No cervical adenopathy.  ?Skin: ?   Findings: No erythema or rash.  ?Neurological:  ?   Mental Status: She is oriented to person, place, and time.  ?   Cranial Nerves: No cranial nerve deficit.  ?   Motor: No abnormal muscle tone.  ?   Coordination: Coordination normal.  ?   Deep Tendon Reflexes: Reflexes normal.  ?Psychiatric:     ?   Behavior: Behavior normal.     ?   Thought Content: Thought content normal.     ?   Judgment: Judgment normal.  ? ?Joints w/pain on ROM ? ?Lab Results  ?Component Value Date  ? WBC 6.0 07/24/2021  ? HGB 13.2 07/24/2021  ? HCT 38.9 07/24/2021  ? PLT 326 07/24/2021  ? GLUCOSE 84 07/24/2021  ? CHOL 161 12/04/2020  ? TRIG 101.0 12/04/2020  ? HDL 40.20 12/04/2020  ? LDLCALC 101 (H) 12/04/2020  ? ALT 22 07/24/2021  ? AST 18 07/24/2021  ? NA 139 07/24/2021  ? K 3.8 07/24/2021  ? CL 100 07/24/2021  ? CREATININE 0.84 07/24/2021  ? BUN  10 07/24/2021  ? CO2 26 07/24/2021  ? TSH 0.590 07/24/2021  ? ? ?CT ABDOMEN PELVIS WO CONTRAST ? ?Result Date: 07/01/2021 ?CLINICAL DATA:  Worsening chronic abdominal pain.  Nausea.  EXAM: CT ABDOMEN AND PELVIS WITHOUT CONTRAST TECHNIQUE: Multidetector CT imaging of the abdomen and pelvis was performed following the standard protocol without IV contrast. COMPARISON:  09/09/2016 FINDINGS: Lower chest: No acute findings. Hepatobiliary: Stable mild diffuse hepatic steatosis. No mass visualized on this unenhanced exam. Gallbladder is unremarkable. No evidence of biliary ductal dilatation. Pancreas: No mass or inflammatory process visualized on this unenhanced exam. Spleen:  Within normal limits in size. Adrenals/Urinary tract: No evidence of urolithiasis or hydronephrosis. Unremarkable unopacified urinary bladder. Stomach/Bowel: No evidence of obstruction, inflammatory process, or abnormal fluid collections. Normal appendix visualized. Vascular/Lymphatic: No pathologically enlarged lymph nodes identified. No evidence of abdominal aortic aneurysm. Reproductive: At least 1 small approximately 2 cm fibroid is seen in the right posterior fundal region. Adnexal regions are unremarkable. Other: Stable small bilateral inguinal hernias, which contain only fat. Musculoskeletal:  No suspicious bone lesions identified. IMPRESSION: No acute findings. Stable mild hepatic steatosis. Small uterine fibroid. Stable small bilateral inguinal hernias, which contain only fat. Electronically Signed   By: Marlaine Hind M.D.   On: 07/01/2021 13:29  ? ? ?Assessment & Plan:  ? ?Problem List Items Addressed This Visit   ? ? HTN (hypertension), benign  ?  Cont w/Amlodipine, Micardis HCT ?  ?  ? Relevant Medications  ? carvedilol (COREG) 12.5 MG tablet  ? Other Relevant Orders  ? CBC with Differential/Platelet  ? Pain in joint, shoulder region  ?  Percocet tid prn ? Potential benefits of a long term opioids use as well as potential risks (i.e.  addiction risk, apnea etc) and complications (i.e. Somnolence, constipation and others) were explained to the patient and were aknowledged. ?S/p Rheumatology eval 2023 ? ?  ?  ? Relevant Orders  ? TSH  ? Insomnia  ?

## 2021-12-21 NOTE — Assessment & Plan Note (Addendum)
Percocet tid prn ? Potential benefits of a long term opioids use as well as potential risks (i.e. addiction risk, apnea etc) and complications (i.e. Somnolence, constipation and others) were explained to the patient and were aknowledged. ?S/p Rheumatology eval 2023 ?

## 2021-12-21 NOTE — Assessment & Plan Note (Signed)
Cont w/Amlodipine, Micardis HCT 

## 2021-12-21 NOTE — Assessment & Plan Note (Signed)
On Trazodone 

## 2021-12-21 NOTE — Assessment & Plan Note (Signed)
S/p eval by Dr Estanislado Pandy  ?Oxy prn ?

## 2021-12-21 NOTE — Assessment & Plan Note (Signed)
Cont on Vit B12 

## 2021-12-21 NOTE — Patient Instructions (Signed)
Blue-Emu cream -- use 2-3 times a day ? ?

## 2021-12-31 ENCOUNTER — Telehealth: Payer: Self-pay

## 2021-12-31 ENCOUNTER — Other Ambulatory Visit: Payer: Self-pay

## 2021-12-31 DIAGNOSIS — K3189 Other diseases of stomach and duodenum: Secondary | ICD-10-CM

## 2021-12-31 NOTE — Telephone Encounter (Signed)
03/11/22 [facility] EUS with GM at 9 am  Left message on machine to call back

## 2021-12-31 NOTE — Telephone Encounter (Signed)
Dr Jacobs please advise  

## 2021-12-31 NOTE — Telephone Encounter (Signed)
-----   Message from Yevette Edwards, RN sent at 12/30/2021 12:28 PM EDT ----- Regarding: FW: needs EUS with GM or DJ Please see note below from Dr. Havery Moros. There is previous documentation in patient's chart indicating that it is OK to schedule EUS.  Thanks! ----- Message ----- From: Yetta Flock, MD Sent: 12/30/2021  11:49 AM EDT To: Yevette Edwards, RN Subject: RE: needs EUS with GM or DJ                    Thanks, I think okay to direct book for EUS given I have spoken with DJ / GM in the past about this case. Thanks  ----- Message ----- From: Yevette Edwards, RN Sent: 12/30/2021   9:00 AM EDT To: Yetta Flock, MD Subject: FW: needs EUS with GM or DJ                    Good morning, This is a reminder that patient needs an EUS as below. Would you like to see her in the office to discuss further?  Please advise. Thanks, [employer] ----- Message ----- From: Yevette Edwards, RN Sent: 12/30/2021  12:00 AM EDT To: Yevette Edwards, RN Subject: needs EUS with GM or DJ                        Patient needs EUS scheduled to evaluate antral gastric lesion given her history of atrophic gastritis.

## 2022-01-01 NOTE — Telephone Encounter (Signed)
EUS scheduled, pt instructed and medications reviewed.  Patient instructions mailed to home and sent to My Chart .  Patient to call with any questions or concerns.  

## 2022-01-07 ENCOUNTER — Telehealth: Payer: Self-pay | Admitting: *Deleted

## 2022-01-07 NOTE — Telephone Encounter (Signed)
Rec'd response back PA was Denied."  It states Medicare allows [place] to cover a drug only when it is a Part D drug. A Part D drug is one that is used for a "medically accepted indication." A medically accepted indication means the use is approved by the Food and Drug Administration (FDA) OR the use is supported by one of the following accepted references: DICLOFENAC SOL 1.5% is not FDA approved for your medical condition.Marland KitchenJohny Chess

## 2022-01-07 NOTE — Telephone Encounter (Signed)
Pt was on cover-my-meds needing PA for het Diclofenac Sol.. completed on cover-my-meds w/ (Key: [id number]). Rec'd msg stating " [employer] is reviewing your PA request. Typically an electronic response will be received within 24-72 hours. To check for an update later, open this request from your dashboard.".Marland Kitchen/l,mb

## 2022-01-12 DIAGNOSIS — H52223 Regular astigmatism, bilateral: Secondary | ICD-10-CM | POA: Diagnosis not present

## 2022-01-12 DIAGNOSIS — H524 Presbyopia: Secondary | ICD-10-CM | POA: Diagnosis not present

## 2022-01-12 DIAGNOSIS — H1045 Other chronic allergic conjunctivitis: Secondary | ICD-10-CM | POA: Diagnosis not present

## 2022-01-12 DIAGNOSIS — H2513 Age-related nuclear cataract, bilateral: Secondary | ICD-10-CM | POA: Diagnosis not present

## 2022-01-12 DIAGNOSIS — H43811 Vitreous degeneration, right eye: Secondary | ICD-10-CM | POA: Diagnosis not present

## 2022-01-12 DIAGNOSIS — H5213 Myopia, bilateral: Secondary | ICD-10-CM | POA: Diagnosis not present

## 2022-01-12 DIAGNOSIS — H16223 Keratoconjunctivitis sicca, not specified as Sjogren's, bilateral: Secondary | ICD-10-CM | POA: Diagnosis not present

## 2022-01-14 ENCOUNTER — Telehealth: Payer: Self-pay | Admitting: Internal Medicine

## 2022-01-14 NOTE — Telephone Encounter (Signed)
LVM for pt to rtn my call to schedule AWV with [facility] call back # [phone] 

## 2022-03-01 ENCOUNTER — Encounter: Payer: Self-pay | Admitting: Internal Medicine

## 2022-03-01 ENCOUNTER — Ambulatory Visit (INDEPENDENT_AMBULATORY_CARE_PROVIDER_SITE_OTHER): Payer: Medicare Other | Admitting: Internal Medicine

## 2022-03-01 ENCOUNTER — Telehealth: Payer: Self-pay | Admitting: Gastroenterology

## 2022-03-01 DIAGNOSIS — I10 Essential (primary) hypertension: Secondary | ICD-10-CM

## 2022-03-01 DIAGNOSIS — Z634 Disappearance and death of family member: Secondary | ICD-10-CM | POA: Diagnosis not present

## 2022-03-01 DIAGNOSIS — S43431S Superior glenoid labrum lesion of right shoulder, sequela: Secondary | ICD-10-CM | POA: Diagnosis not present

## 2022-03-01 DIAGNOSIS — G4709 Other insomnia: Secondary | ICD-10-CM

## 2022-03-01 DIAGNOSIS — K2950 Unspecified chronic gastritis without bleeding: Secondary | ICD-10-CM | POA: Diagnosis not present

## 2022-03-01 DIAGNOSIS — M545 Low back pain, unspecified: Secondary | ICD-10-CM

## 2022-03-01 DIAGNOSIS — M25511 Pain in right shoulder: Secondary | ICD-10-CM

## 2022-03-01 DIAGNOSIS — F4321 Adjustment disorder with depressed mood: Secondary | ICD-10-CM

## 2022-03-01 DIAGNOSIS — E538 Deficiency of other specified B group vitamins: Secondary | ICD-10-CM | POA: Diagnosis not present

## 2022-03-01 DIAGNOSIS — M79604 Pain in right leg: Secondary | ICD-10-CM

## 2022-03-01 DIAGNOSIS — K2970 Gastritis, unspecified, without bleeding: Secondary | ICD-10-CM | POA: Insufficient documentation

## 2022-03-01 DIAGNOSIS — M5010 Cervical disc disorder with radiculopathy, unspecified cervical region: Secondary | ICD-10-CM | POA: Diagnosis not present

## 2022-03-01 LAB — CBC WITH DIFFERENTIAL/PLATELET
Basophils Absolute: 0 10*3/uL (ref 0.0–0.1)
Basophils Relative: 0.8 % (ref 0.0–3.0)
Eosinophils Absolute: 0.1 10*3/uL (ref 0.0–0.7)
Eosinophils Relative: 1.5 % (ref 0.0–5.0)
HCT: 39.9 % (ref 36.0–46.0)
Hemoglobin: 13.3 g/dL (ref 12.0–15.0)
Lymphocytes Relative: 35.4 % (ref 12.0–46.0)
Lymphs Abs: 1.8 10*3/uL (ref 0.7–4.0)
MCHC: 33.3 g/dL (ref 30.0–36.0)
MCV: 95.9 fl (ref 78.0–100.0)
Monocytes Absolute: 0.4 10*3/uL (ref 0.1–1.0)
Monocytes Relative: 7 % (ref 3.0–12.0)
Neutro Abs: 2.9 10*3/uL (ref 1.4–7.7)
Neutrophils Relative %: 55.3 % (ref 43.0–77.0)
Platelets: 312 10*3/uL (ref 150.0–400.0)
RBC: 4.15 Mil/uL (ref 3.87–5.11)
RDW: 12.4 % (ref 11.5–15.5)
WBC: 5.2 10*3/uL (ref 4.0–10.5)

## 2022-03-01 LAB — COMPREHENSIVE METABOLIC PANEL
ALT: 39 U/L — ABNORMAL HIGH (ref 0–35)
AST: 28 U/L (ref 0–37)
Albumin: 4.5 g/dL (ref 3.5–5.2)
Alkaline Phosphatase: 98 U/L (ref 39–117)
BUN: 11 mg/dL (ref 6–23)
CO2: 29 mEq/L (ref 19–32)
Calcium: 9.6 mg/dL (ref 8.4–10.5)
Chloride: 100 mEq/L (ref 96–112)
Creatinine, Ser: 0.86 mg/dL (ref 0.40–1.20)
GFR: 71.08 mL/min (ref >60.00)
Glucose, Bld: 86 mg/dL (ref 70–99)
Potassium: 3.4 mEq/L — ABNORMAL LOW (ref 3.5–5.1)
Sodium: 137 mEq/L (ref 135–145)
Total Bilirubin: 0.5 mg/dL (ref 0.2–1.2)
Total Protein: 8 g/dL (ref 6.0–8.3)

## 2022-03-01 LAB — TSH: TSH: 0.65 u[IU]/mL (ref 0.35–5.50)

## 2022-03-01 MED ORDER — OXYCODONE-ACETAMINOPHEN 5-325 MG PO TABS: 1.0000 | ORAL_TABLET | Freq: Three times a day (TID) | ORAL | 0 refills | Status: DC | PRN

## 2022-03-01 MED ORDER — TRAZODONE HCL 50 MG PO TABS
ORAL_TABLET | ORAL | 3 refills | Status: DC
Start: 2022-03-01 — End: 2024-01-03

## 2022-03-01 MED ORDER — DULOXETINE HCL 20 MG PO CPEP: 20.0000 mg | ORAL_CAPSULE | Freq: Every day | ORAL | 5 refills | Status: DC

## 2022-03-01 MED ORDER — OXYCODONE-ACETAMINOPHEN 5-325 MG PO TABS: 1.0000 | ORAL_TABLET | Freq: Four times a day (QID) | ORAL | 0 refills | Status: DC | PRN

## 2022-03-01 MED ORDER — LORAZEPAM 0.5 MG PO TABS: 0.5000 mg | ORAL_TABLET | Freq: Two times a day (BID) | ORAL | 1 refills | Status: DC | PRN

## 2022-03-01 NOTE — Assessment & Plan Note (Signed)
Cont on Vit B12 

## 2022-03-01 NOTE — Assessment & Plan Note (Signed)
Taking ASA 500 mg 6 a day for pain too. Asked to d/c ASA.   Percocet to tid  Potential benefits of a long term opioids use as well as potential risks (i.e. addiction risk, apnea etc) and complications (i.e. Somnolence, constipation and others) were explained to the patient and were aknowledged.

## 2022-03-01 NOTE — Telephone Encounter (Signed)
The pt has been instructed on the EUS.  Instructions have also been sent to My Chart.  All questions have been answered.

## 2022-03-01 NOTE — Assessment & Plan Note (Signed)
Taking ASA 500 mg 6 a day for pain too - asked to d/c  Dr Havery Moros - EUS pending on 7/27

## 2022-03-01 NOTE — Assessment & Plan Note (Signed)
Worse Start Cymbalta

## 2022-03-01 NOTE — Assessment & Plan Note (Signed)
Percocet po tid  Potential benefits of a long term opioids use as well as potential risks (i.e. addiction risk, apnea etc) and complications (i.e. Somnolence, constipation and others) were explained to the patient and were aknowledged. S/p Rheumatology eval 2023

## 2022-03-01 NOTE — Progress Notes (Signed)
Subjective:  Patient ID: Karina Newman, female    DOB: 03-18-1956  Age: 66 y.o. MRN: [id number]  CC: No chief complaint on file.   HPI Donnesha Karg presents for arthritis, more depressed F/u on HTN, B12 def, OA Taking ASA 500 mg 6 a day for pain too EUS pending  Outpatient Medications Prior to Visit  Medication Sig Dispense Refill   amLODipine (NORVASC) 5 MG tablet TAKE 1 TABLET (5 MG TOTAL) BY MOUTH DAILY. 90 tablet 3   carvedilol (COREG) 12.5 MG tablet Take 1 tablet (12.5 mg total) by mouth 2 (two) times daily. 180 tablet 3   Cyanocobalamin (VITAMIN B-12) 500 MCG SUBL 1 sl qd 100 tablet 5   cyclobenzaprine (FLEXERIL) 5 MG tablet TAKE 1 TABLET BY MOUTH THREE TIMES A DAY AS NEEDED FOR MUSCLE SPASMS 60 tablet 2   Diclofenac Sodium 1.5 % SOLN USE 15 DROPS THREE TIMES DAILY EXTERNALLY FOR PAIN 150 mL 5   dicyclomine (BENTYL) 10 MG capsule TAKE 1 CAPSULE (10 MG TOTAL) BY MOUTH EVERY 8 (EIGHT) HOURS AS NEEDED FOR SPASMS. 270 capsule 3   doxazosin (CARDURA) 4 MG tablet TAKE 1 TABLET BY MOUTH EVERY DAY 90 tablet 1   fluticasone (FLONASE) 50 MCG/ACT nasal spray Place 1 spray into both nostrils as needed. 48 mL 3   gabapentin (NEURONTIN) 100 MG capsule TAKE 1 CAPSULE BY MOUTH THREE TIMES A DAY AS NEEDED 90 capsule 3   omeprazole (PRILOSEC) 40 MG capsule TAKE 1 CAPSULE BY MOUTH IN THE MORNING AND AT BEDTIME. 60 capsule 5   polyethylene glycol (MIRALAX) 17 g packet Take daily as directed. Increase or decrease as needed 14 each 0   potassium chloride (KLOR-CON) 10 MEQ tablet Take 1 tablet (10 mEq total) by mouth daily. Take 1 tablet (10 mEq total) by mouth daily. 90 tablet 3   sucralfate (CARAFATE) 1 g tablet TAKE 1 TABLET BY MOUTH 3 TIMES A DAY BETWEEN MEALS 270 tablet 3   telmisartan (MICARDIS) 80 MG tablet Take 1 tablet (80 mg total) by mouth daily. 90 tablet 3   valACYclovir (VALTREX) 500 MG tablet Take one tablet po BID x 3 days prn outbreak 30 tablet 1   Vitamin D, Cholecalciferol, 1000  units CAPS Take 2,000 Units by mouth.     LORazepam (ATIVAN) 0.5 MG tablet Take 1 tablet (0.5 mg total) by mouth 2 (two) times daily as needed for anxiety or sleep. 60 tablet 1   oxyCODONE-acetaminophen (PERCOCET/ROXICET) 5-325 MG tablet Take 1 tablet by mouth every 8 (eight) hours as needed for severe pain. 90 tablet 0   traZODone (DESYREL) 50 MG tablet TAKE 1 TABLET BY MOUTH EVERYDAY AT BEDTIME 90 tablet 3   vortioxetine HBr (TRINTELLIX) 5 MG TABS tablet Take 1 tablet (5 mg total) by mouth daily. (Patient not taking: Reported on 10/21/2021) 30 tablet 5   No facility-administered medications prior to visit.    ROS: Review of Systems  Constitutional:  Negative for activity change, appetite change, chills, fatigue and unexpected weight change.  HENT:  Negative for congestion, mouth sores and sinus pressure.   Eyes:  Negative for visual disturbance.  Respiratory:  Negative for cough and chest tightness.   Gastrointestinal:  Negative for abdominal pain and nausea.  Genitourinary:  Negative for difficulty urinating, frequency and vaginal pain.  Musculoskeletal:  Positive for arthralgias, neck pain and neck stiffness. Negative for back pain and gait problem.  Skin:  Negative for pallor and rash.  Neurological:  Negative for dizziness,  tremors, weakness, numbness and headaches.  Psychiatric/Behavioral:  Positive for dysphoric mood. Negative for confusion, sleep disturbance and suicidal ideas. The patient is nervous/anxious.     Objective:  BP 140/70 (BP Location: Right Arm, Patient Position: Sitting, Cuff Size: Normal)   Pulse 86   Temp 98.4 F (36.9 C) (Oral)   Ht '5\' 4"'$  (1.626 m)   Wt 174 lb (78.9 kg)   SpO2 97%   BMI 29.87 kg/m   BP Readings from Last 3 Encounters:  03/01/22 140/70  12/21/21 140/90  12/07/21 (!) 146/84    Wt Readings from Last 3 Encounters:  03/01/22 174 lb (78.9 kg)  12/21/21 174 lb (78.9 kg)  12/07/21 176 lb 1.6 oz (79.9 kg)    Physical Exam Constitutional:       General: She is not in acute distress.    Appearance: She is well-developed. She is obese.  HENT:     Head: Normocephalic.     Right Ear: External ear normal.     Left Ear: External ear normal.     Nose: Nose normal.  Eyes:     General:        Right eye: No discharge.        Left eye: No discharge.     Conjunctiva/sclera: Conjunctivae normal.     Pupils: Pupils are equal, round, and reactive to light.  Neck:     Thyroid: No thyromegaly.     Vascular: No JVD.     Trachea: No tracheal deviation.  Cardiovascular:     Rate and Rhythm: Normal rate and regular rhythm.     Heart sounds: Normal heart sounds.  Pulmonary:     Effort: No respiratory distress.     Breath sounds: No stridor. No wheezing.  Abdominal:     General: Bowel sounds are normal. There is no distension.     Palpations: Abdomen is soft. There is no mass.     Tenderness: There is no abdominal tenderness. There is no guarding or rebound.  Musculoskeletal:        General: No tenderness.     Cervical back: Normal range of motion and neck supple. No rigidity.  Lymphadenopathy:     Cervical: No cervical adenopathy.  Skin:    Findings: No erythema or rash.  Neurological:     Cranial Nerves: No cranial nerve deficit.     Motor: No abnormal muscle tone.     Coordination: Coordination normal.     Gait: Gait abnormal.     Deep Tendon Reflexes: Reflexes normal.  Psychiatric:        Behavior: Behavior normal.        Thought Content: Thought content normal.        Judgment: Judgment normal.     Lab Results  Component Value Date   WBC 6.0 07/24/2021   HGB 13.2 07/24/2021   HCT 38.9 07/24/2021   PLT 326 07/24/2021   GLUCOSE 84 07/24/2021   CHOL 161 12/04/2020   TRIG 101.0 12/04/2020   HDL 40.20 12/04/2020   LDLCALC 101 (H) 12/04/2020   ALT 22 07/24/2021   AST 18 07/24/2021   NA 139 07/24/2021   K 3.8 07/24/2021   CL 100 07/24/2021   CREATININE 0.84 07/24/2021   BUN 10 07/24/2021   CO2 26 07/24/2021    TSH 0.590 07/24/2021    CT ABDOMEN PELVIS WO CONTRAST  Result Date: 07/01/2021 CLINICAL DATA:  Worsening chronic abdominal pain.  Nausea. EXAM: CT ABDOMEN AND PELVIS WITHOUT CONTRAST  TECHNIQUE: Multidetector CT imaging of the abdomen and pelvis was performed following the standard protocol without IV contrast. COMPARISON:  09/09/2016 FINDINGS: Lower chest: No acute findings. Hepatobiliary: Stable mild diffuse hepatic steatosis. No mass visualized on this unenhanced exam. Gallbladder is unremarkable. No evidence of biliary ductal dilatation. Pancreas: No mass or inflammatory process visualized on this unenhanced exam. Spleen:  Within normal limits in size. Adrenals/Urinary tract: No evidence of urolithiasis or hydronephrosis. Unremarkable unopacified urinary bladder. Stomach/Bowel: No evidence of obstruction, inflammatory process, or abnormal fluid collections. Normal appendix visualized. Vascular/Lymphatic: No pathologically enlarged lymph nodes identified. No evidence of abdominal aortic aneurysm. Reproductive: At least 1 small approximately 2 cm fibroid is seen in the right posterior fundal region. Adnexal regions are unremarkable. Other: Stable small bilateral inguinal hernias, which contain only fat. Musculoskeletal:  No suspicious bone lesions identified. IMPRESSION: No acute findings. Stable mild hepatic steatosis. Small uterine fibroid. Stable small bilateral inguinal hernias, which contain only fat. Electronically Signed   By: Marlaine Hind M.D.   On: 07/01/2021 13:29    Assessment & Plan:   Problem List Items Addressed This Visit     Cervical disc disorder with radiculopathy of cervical region    Taking ASA 500 mg 6 a day for pain too. Asked to d/c ASA.   Percocet to tid  Potential benefits of a long term opioids use as well as potential risks (i.e. addiction risk, apnea etc) and complications (i.e. Somnolence, constipation and others) were explained to the patient and were aknowledged.       Relevant Medications   LORazepam (ATIVAN) 0.5 MG tablet   traZODone (DESYREL) 50 MG tablet   DULoxetine (CYMBALTA) 20 MG capsule   Gastritis    Taking ASA 500 mg 6 a day for pain too - asked to d/c  Dr Havery Moros - EUS pending on 7/27      Grief at loss of child    Worse Start Cymbalta      Labral tear of shoulder    Taking ASA 500 mg 6 a day for pain too. Asked to d/c ASA.   Percocet to tid  Potential benefits of a long term opioids use as well as potential risks (i.e. addiction risk, apnea etc) and complications (i.e. Somnolence, constipation and others) were explained to the patient and were aknowledged.      Low vitamin B12 level    Cont on Vit B12      Pain in joint, shoulder region     Percocet po tid  Potential benefits of a long term opioids use as well as potential risks (i.e. addiction risk, apnea etc) and complications (i.e. Somnolence, constipation and others) were explained to the patient and were aknowledged. S/p Rheumatology eval 2023         Meds ordered this encounter  Medications   LORazepam (ATIVAN) 0.5 MG tablet    Sig: Take 1 tablet (0.5 mg total) by mouth 2 (two) times daily as needed for anxiety or sleep.    Dispense:  60 tablet    Refill:  1   oxyCODONE-acetaminophen (PERCOCET/ROXICET) 5-325 MG tablet    Sig: Take 1 tablet by mouth every 8 (eight) hours as needed for severe pain.    Dispense:  90 tablet    Refill:  0    Please fill on or after 03/01/22   traZODone (DESYREL) 50 MG tablet    Sig: TAKE 1 TABLET BY MOUTH EVERYDAY AT BEDTIME    Dispense:  90 tablet  Refill:  3   DULoxetine (CYMBALTA) 20 MG capsule    Sig: Take 1 capsule (20 mg total) by mouth daily.    Dispense:  90 capsule    Refill:  5   oxyCODONE-acetaminophen (PERCOCET/ROXICET) 5-325 MG tablet    Sig: Take 1 tablet by mouth every 6 (six) hours as needed for severe pain.    Dispense:  90 tablet    Refill:  0    Please fill on or after 03/31/22      Follow-up: Return  in about 3 months (around 06/01/2022) for a follow-up visit.  Walker Kehr, MD

## 2022-03-04 ENCOUNTER — Encounter (HOSPITAL_COMMUNITY): Payer: Self-pay | Admitting: Gastroenterology

## 2022-03-11 ENCOUNTER — Other Ambulatory Visit: Payer: Self-pay

## 2022-03-11 ENCOUNTER — Ambulatory Visit (HOSPITAL_COMMUNITY)
Admission: RE | Admit: 2022-03-11 | Discharge: 2022-03-11 | Disposition: A | Discharge status: Home / Self Care | Payer: Medicare Other | Attending: Gastroenterology | Admitting: Gastroenterology

## 2022-03-11 ENCOUNTER — Ambulatory Visit (HOSPITAL_COMMUNITY): Payer: Medicare Other | Admitting: Anesthesiology

## 2022-03-11 ENCOUNTER — Encounter (HOSPITAL_COMMUNITY): Payer: Self-pay | Admitting: Gastroenterology

## 2022-03-11 ENCOUNTER — Ambulatory Visit (HOSPITAL_BASED_OUTPATIENT_CLINIC_OR_DEPARTMENT_OTHER): Payer: Medicare Other | Admitting: Anesthesiology

## 2022-03-11 ENCOUNTER — Encounter (HOSPITAL_COMMUNITY)
Admission: RE | Disposition: A | Discharge status: Home / Self Care | Payer: Self-pay | Source: Home / Self Care | Attending: Gastroenterology

## 2022-03-11 DIAGNOSIS — K219 Gastro-esophageal reflux disease without esophagitis: Secondary | ICD-10-CM | POA: Insufficient documentation

## 2022-03-11 DIAGNOSIS — K2970 Gastritis, unspecified, without bleeding: Secondary | ICD-10-CM | POA: Diagnosis not present

## 2022-03-11 DIAGNOSIS — I10 Essential (primary) hypertension: Secondary | ICD-10-CM | POA: Insufficient documentation

## 2022-03-11 DIAGNOSIS — K2289 Other specified disease of esophagus: Secondary | ICD-10-CM

## 2022-03-11 DIAGNOSIS — K3189 Other diseases of stomach and duodenum: Secondary | ICD-10-CM

## 2022-03-11 DIAGNOSIS — I899 Noninfective disorder of lymphatic vessels and lymph nodes, unspecified: Secondary | ICD-10-CM | POA: Insufficient documentation

## 2022-03-11 HISTORY — PX: BIOPSY: SHX5522

## 2022-03-11 HISTORY — PX: ESOPHAGOGASTRODUODENOSCOPY (EGD) WITH PROPOFOL: SHX5813

## 2022-03-11 HISTORY — PX: EUS: SHX5427

## 2022-03-11 SURGERY — UPPER ENDOSCOPIC ULTRASOUND (EUS) RADIAL
Anesthesia: Monitor Anesthesia Care

## 2022-03-11 MED ORDER — GLYCOPYRROLATE 0.2 MG/ML IJ SOLN
INTRAMUSCULAR | Status: DC | PRN
  Administered 2022-03-11: .2 mg via INTRAVENOUS

## 2022-03-11 MED ORDER — LIDOCAINE 2% (20 MG/ML) 5 ML SYRINGE
INTRAMUSCULAR | Status: DC | PRN
  Administered 2022-03-11: 80 mg via INTRAVENOUS

## 2022-03-11 MED ORDER — LACTATED RINGERS IV SOLN
INTRAVENOUS | Status: AC | PRN
  Administered 2022-03-11: 10 mL/h via INTRAVENOUS

## 2022-03-11 MED ORDER — PROPOFOL 500 MG/50ML IV EMUL
INTRAVENOUS | Status: DC | PRN
  Administered 2022-03-11: 125 ug/kg/min via INTRAVENOUS

## 2022-03-11 MED ORDER — PHENYLEPHRINE HCL-NACL 20-0.9 MG/250ML-% IV SOLN: INTRAVENOUS | Status: DC | PRN

## 2022-03-11 MED ORDER — SODIUM CHLORIDE 0.9 % IV SOLN: INTRAVENOUS | Status: DC

## 2022-03-11 MED ORDER — PHENYLEPHRINE 80 MCG/ML (10ML) SYRINGE FOR IV PUSH (FOR BLOOD PRESSURE SUPPORT)
PREFILLED_SYRINGE | INTRAVENOUS | Status: DC | PRN
  Administered 2022-03-11 (×2): 80 ug via INTRAVENOUS

## 2022-03-11 MED ORDER — PROPOFOL 10 MG/ML IV BOLUS
INTRAVENOUS | Status: DC | PRN
  Administered 2022-03-11: 70 mg via INTRAVENOUS

## 2022-03-11 NOTE — H&P (Signed)
GASTROENTEROLOGY PROCEDURE H&P NOTE   Primary Care Physician: Cassandria Anger, MD  HPI: Karina Newman is a 66 y.o. female who presents for EGD/EUS to evaluate a SEL of the stomach noted in 2022.  Past Medical History:  Diagnosis Date   Abnormal cervical Pap smear with positive HPV DNA test 01/2014,01/2017   2015 Normal cytology with positive high-risk HPV 18/45. Colposcopy negative with negative ECC.  2018 normal cytology with positive high-risk HPV 18/45   Anemia    Apnea 05/17/2018   Autoimmune gastritis    Fatty liver    Gastritis    Heart murmur    Hypertension    Hyperthyroidism    Inappropriate sinus tachycardia 05/17/2018   Internal hemorrhoids    Osteoarthritis    Right shoulder pain 2001   chronic pain   Thyroid disease    Hyperthyroid. Was on PTU (Dr Hampton Abbot in [place]) - stopped in 2013, stable   Tubular adenoma of colon    Past Surgical History:  Procedure Laterality Date   BREAST CYST ASPIRATION Left 2012   CESAREAN SECTION     x 4    COLONOSCOPY     HAND SURGERY Left    SHOULDER SURGERY Right 2002   SHOULDER SURGERY Left 2014   TUBAL LIGATION     UPPER GASTROINTESTINAL ENDOSCOPY     Current Facility-Administered Medications  Medication Dose Route Frequency Provider Last Rate Last Admin   0.9 %  sodium chloride infusion   Intravenous Continuous Mansouraty, Telford Nab., MD        Current Facility-Administered Medications:    0.9 %  sodium chloride infusion, , Intravenous, Continuous, Mansouraty, Telford Nab., MD Allergies  Allergen Reactions   Penicillins Hives   Tramadol Anaphylaxis    Memory lapses    Iodine Other (See Comments)    Due to hyperthyroidism   Lexapro [Escitalopram]     fatigue   Lyrica [Pregabalin] Dermatitis   Nsaids     gastritis   Voltaren [Diclofenac] Hives   Family History  Problem Relation Age of Onset   Kidney disease Mother        ESRD   Hypertension Mother    Lung cancer Father    Hypertension Sister    Kidney  disease Sister    Hypertension Sister    Diabetes Brother    Hypertension Brother    Hypertension Brother    Diabetes Paternal Grandmother    Sleep apnea Son    Hypertension Son    Colon cancer Neg Hx    Stomach cancer Neg Hx    Esophageal cancer Neg Hx    Rectal cancer Neg Hx    Pancreatic cancer Neg Hx    Social History   Socioeconomic History   Marital status: Married    Spouse name: Not on file   Number of children: 4   Years of education: Not on file   Highest education level: Not on file  Occupational History   Occupation: RETIRED  Tobacco Use   Smoking status: Never    Passive exposure: Never   Smokeless tobacco: Never  Vaping Use   Vaping Use: Never used  Substance and Sexual Activity   Alcohol use: Yes    Comment: RARELY   Drug use: No   Sexual activity: Yes    Birth control/protection: Post-menopausal, Surgical    Comment: BTL-1st intercourse 16 yo-5 partners  Other Topics Concern   Not on file  Social History Narrative   Not on file  Social Determinants of Health   Financial Resource Strain: Not on file  Food Insecurity: Not on file  Transportation Needs: Not on file  Physical Activity: Not on file  Stress: Not on file  Social Connections: Not on file  Intimate Partner Violence: Not on file    Physical Exam: There were no vitals filed for this visit. There is no height or weight on file to calculate BMI. GEN: NAD EYE: Sclerae anicteric ENT: MMM CV: Non-tachycardic GI: Soft, NT/ND NEURO:  Alert & Oriented x 3  Lab Results: No results for input(s): "WBC", "HGB", "HCT", "PLT" in the last 72 hours. BMET No results for input(s): "NA", "K", "CL", "CO2", "GLUCOSE", "BUN", "CREATININE", "CALCIUM" in the last 72 hours. LFT No results for input(s): "PROT", "ALBUMIN", "AST", "ALT", "ALKPHOS", "BILITOT", "BILIDIR", "IBILI" in the last 72 hours. PT/INR No results for input(s): "LABPROT", "INR" in the last 72 hours.   Impression / Plan: This is  a 66 y.o.female who presents for EGD/EUS to evaluate a SEL of the stomach noted in 2022.  The risks of an EUS including intestinal perforation, bleeding, infection, aspiration, and medication effects were discussed as was the possibility it may not give a definitive diagnosis if a biopsy is performed.  When a biopsy of the pancreas is done as part of the EUS, there is an additional risk of pancreatitis at the rate of about 1-2%.  It was explained that procedure related pancreatitis is typically mild, although it can be severe and even life threatening, which is why we do not perform random pancreatic biopsies and only biopsy a lesion/area we feel is concerning enough to warrant the risk.   The risks and benefits of endoscopic evaluation/treatment were discussed with the patient and/or family; these include but are not limited to the risk of perforation, infection, bleeding, missed lesions, lack of diagnosis, severe illness requiring hospitalization, as well as anesthesia and sedation related illnesses.  The patient's history has been reviewed, patient examined, no change in status, and deemed stable for procedure.  The patient and/or family is agreeable to proceed.    Justice Britain, MD [facility] Office # [phone]

## 2022-03-11 NOTE — Transfer of Care (Signed)
Immediate Anesthesia Transfer of Care Note  Patient: Karina Newman  Procedure(s) Performed: UPPER ENDOSCOPIC ULTRASOUND (EUS) RADIAL BIOPSY  Patient Location: PACU  Anesthesia Type:MAC  Level of Consciousness: awake and patient cooperative  Airway & Oxygen Therapy: Patient Spontanous Breathing and Patient connected to nasal cannula oxygen  Post-op Assessment: Report given to RN and Post -op Vital signs reviewed and stable  Post vital signs: Reviewed and stable  Last Vitals:  Vitals Value Taken Time  BP 113/95 03/11/22 0932  Temp    Pulse 78 03/11/22 0941  Resp 17 03/11/22 0941  SpO2 96 % 03/11/22 0941  Vitals shown include unvalidated device data.  Last Pain:  Vitals:   03/11/22 0932  TempSrc:   PainSc: 0-No pain         Complications: No notable events documented.

## 2022-03-11 NOTE — Anesthesia Postprocedure Evaluation (Signed)
Anesthesia Post Note  Patient: Jaquasha Carnevale  Procedure(s) Performed: UPPER ENDOSCOPIC ULTRASOUND (EUS) RADIAL BIOPSY     Patient location during evaluation: Endoscopy Anesthesia Type: MAC Level of consciousness: awake and alert, patient cooperative and oriented Pain management: pain level controlled Vital Signs Assessment: post-procedure vital signs reviewed and stable Respiratory status: nonlabored ventilation, spontaneous breathing and respiratory function stable Cardiovascular status: blood pressure returned to baseline and stable Postop Assessment: no apparent nausea or vomiting Anesthetic complications: no   No notable events documented.  Last Vitals:  Vitals:   03/11/22 0932 03/11/22 0947  BP: (!) 113/95 119/70  Pulse: 83 64  Resp: 12 16  Temp: (!) 36.2 C (!) 36.2 C  SpO2: 97% 98%    Last Pain:  Vitals:   03/11/22 0947  TempSrc:   PainSc: 0-No pain                 Champion Corales,E. Aldora Perman

## 2022-03-11 NOTE — Anesthesia Preprocedure Evaluation (Addendum)
Anesthesia Evaluation  Patient identified by MRN, date of birth, ID band Patient awake    Reviewed: Allergy & Precautions, NPO status , Patient's Chart, lab work & pertinent test results, reviewed documented beta blocker date and time   History of Anesthesia Complications Negative for: history of anesthetic complications  Airway Mallampati: I  TM Distance: >3 FB Neck ROM: Full    Dental  (+) Dental Advisory Given   Pulmonary neg pulmonary ROS,    breath sounds clear to auscultation       Cardiovascular hypertension, Pt. on medications and Pt. on home beta blockers (-) angina Rhythm:Regular Rate:Normal  '22 ECHO: EF 50-55%, low normal LVF, normal RVF, no significant valvular abnormalities   Neuro/Psych  Headaches, negative psych ROS   GI/Hepatic Neg liver ROS, GERD  Medicated and Controlled,  Endo/Other  Morbid obesity  Renal/GU negative Renal ROS     Musculoskeletal  (+) Arthritis ,   Abdominal (+) + obese,   Peds  Hematology negative hematology ROS (+)   Anesthesia Other Findings   Reproductive/Obstetrics                            Anesthesia Physical Anesthesia Plan  ASA: 2  Anesthesia Plan: MAC   Post-op Pain Management: Minimal or no pain anticipated   Induction:   PONV Risk Score and Plan: 2 and Ondansetron and Treatment may vary due to age or medical condition  Airway Management Planned: Natural Airway and Nasal Cannula  Additional Equipment: None  Intra-op Plan:   Post-operative Plan:   Informed Consent: I have reviewed the patients History and Physical, chart, labs and discussed the procedure including the risks, benefits and alternatives for the proposed anesthesia with the patient or authorized representative who has indicated his/her understanding and acceptance.     Dental advisory given  Plan Discussed with: CRNA and Surgeon  Anesthesia Plan Comments:         Anesthesia Quick Evaluation

## 2022-03-11 NOTE — Op Note (Signed)
[facility] Patient Name: Karina Newman Procedure Date : 03/11/2022 MRN: [id number] Attending MD: Justice Britain , MD Date of Birth: 05-Jun-1956 CSN: [id number] Age: 66 Admit Type: Outpatient Procedure:                Upper EUS Indications:              Gastric deformity on endoscopy/Subepithelial tumor                            versus extrinsic compression Providers:                Justice Britain, MD, Ervin Knack, RN, [facility] Technician, Technician Referring MD:             Carlota Raspberry. Havery Moros, MD, Evie Lacks. Plotnikov MD,                            MD Medicines:                Monitored Anesthesia Care Complications:            No immediate complications. Estimated Blood Loss:     Estimated blood loss was minimal. Procedure:                Pre-Anesthesia Assessment:                           - Prior to the procedure, a History and Physical                            was performed, and patient medications and                            allergies were reviewed. The patient's tolerance of                            previous anesthesia was also reviewed. The risks                            and benefits of the procedure and the sedation                            options and risks were discussed with the patient.                            All questions were answered, and informed consent                            was obtained. Prior Anticoagulants: The patient has                            taken no previous anticoagulant or antiplatelet                            agents  except for NSAID medication. ASA Grade                            Assessment: II - A patient with mild systemic                            disease. After reviewing the risks and benefits,                            the patient was deemed in satisfactory condition to                            undergo the procedure.                           After obtaining  informed consent, the endoscope was                            passed under direct vision. Throughout the                            procedure, the patient's blood pressure, pulse, and                            oxygen saturations were monitored continuously. The                            [id number]-[id number] ([id number]) Olympus endoscope was introduced                            through the mouth, and advanced to the second part                            of duodenum. The [id number]-[id number] ([id number]) Olympus                            radial ultrasound scope was introduced through the                            mouth, and advanced to the stomach for ultrasound                            examination. The [id number] ([id number]) Olympus                            Forward View EUS was introduced through the mouth,                            and advanced to the stomach for ultrasound                            examination. The upper EUS was accomplished without  difficulty. The patient tolerated the procedure. Scope In: Scope Out: Findings:      ENDOSCOPIC FINDING: :      No gross lesions were noted in the entire esophagus.      The Z-line was irregular and was found 40 cm from the incisors.      Segmental mild inflammation characterized by erythema and friability and       atrophy was found in the entire examined stomach. Gastric mapping       biopsies had just been performed within last year, so these were not       repeated as she has known atrophic gastritis.      Extrinsic impression vs subepithelial lesion noted on the greater       curvature of the gastric antrum.      Patchy moderately erythematous mucosa without active bleeding and with       no stigmata of bleeding was found in the duodenal bulb, in the first       portion of the duodenum and in the second portion of the duodenum.       Biopsies were taken with a cold forceps for histology.      ENDOSONOGRAPHIC FINDING: :       An oval intramural (subepithelial) lesion was found in the antrum of the       stomach. The lesion was hyperechoic. Sonographically, the lesion       appeared to originate from the submucosa (Layer 3). The lesion measured       7 mm (in maximum thickness). The lesion also measured 6 mm in diameter.       The outer endosonographic borders were smooth.      Endosonographic images of the stomach were otherwise unremarkable. No       wall thickening was identified.      Endosonographic imaging in the visualized portion of the liver showed no       mass-lesions.      No malignant-appearing lymph nodes were visualized in the perigastric       region. Impression:               EGD Impression:                           - No gross lesions in esophagus. Z-line irregular,                            40 cm from the incisors.                           - Atrophic appearing gastritis (previously known                            and monitored).                           - Extrinsic impression vs SEL in the gastric antrum                            (greater curvature).                           - Erythematous duodenopathy. Biopsied.  EUS Impression:                           - An intramural (subepithelial) lesion was found in                            the antrum of the stomach. The lesion appeared to                            originate from within the submucosa (Layer 3).                            Tissue has not been obtained. However, the                            endosonographic appearance is consistent with a                            lipoma.                           - Endosonographic images of the stomach were                            unremarkable.                           - No malignant-appearing lymph nodes were                            visualized in the perigastric region. Recommendation:           - The patient will be observed post-procedure,                             until all discharge criteria are met.                           - Discharge patient to home.                           - Patient has a contact number available for                            emergencies. The signs and symptoms of potential                            delayed complications were discussed with the                            patient. Return to normal activities tomorrow.                            Written discharge instructions were provided to the  patient.                           - Resume previous diet.                           - Observe patient's clinical course.                           - Await path results.                           - Return to GI clinic as previously scheduled.                           - Unless there becomes a significant change in size                            or other issues develop, this lesion does not                            require further surveillance.                           - The findings and recommendations were discussed                            with the patient.                           - The findings and recommendations were discussed                            with the patient's family. Procedure Code(s):        --- Professional ---                           [phone], Esophagogastroduodenoscopy, flexible,                            transoral; with endoscopic ultrasound examination                            limited to the esophagus, stomach or duodenum, and                            adjacent structures                           43239, Esophagogastroduodenoscopy, flexible,                            transoral; with biopsy, single or multiple Diagnosis Code(s):        --- Professional ---                           K22.8, Other specified diseases of esophagus  K29.70, Gastritis, unspecified, without bleeding                           K31.89, Other diseases of stomach  and duodenum                           I89.9, Noninfective disorder of lymphatic vessels                            and lymph nodes, unspecified CPT copyright 2019 American Medical Association. All rights reserved. The codes documented in this report are preliminary and upon coder review may  be revised to meet current compliance requirements. Justice Britain, MD 03/11/2022 9:37:39 AM Number of Addenda: 0

## 2022-03-11 NOTE — Anesthesia Procedure Notes (Signed)
Procedure Name: MAC Date/Time: 03/11/2022 8:40 AM  Performed by: Lowella Dell, CRNAPre-anesthesia Checklist: Patient identified, Emergency Drugs available, Suction available, Patient being monitored and Timeout performed Patient Re-evaluated:Patient Re-evaluated prior to induction Oxygen Delivery Method: Nasal cannula Placement Confirmation: positive ETCO2 Dental Injury: Teeth and Oropharynx as per pre-operative assessment

## 2022-03-12 ENCOUNTER — Encounter (HOSPITAL_COMMUNITY): Payer: Self-pay | Admitting: Gastroenterology

## 2022-03-12 LAB — SURGICAL PATHOLOGY

## 2022-03-13 ENCOUNTER — Encounter: Payer: Self-pay | Admitting: Gastroenterology

## 2022-03-16 ENCOUNTER — Ambulatory Visit (HOSPITAL_BASED_OUTPATIENT_CLINIC_OR_DEPARTMENT_OTHER): Payer: Medicare Other | Admitting: Cardiovascular Disease

## 2022-03-30 DIAGNOSIS — H2513 Age-related nuclear cataract, bilateral: Secondary | ICD-10-CM | POA: Diagnosis not present

## 2022-03-30 DIAGNOSIS — H2511 Age-related nuclear cataract, right eye: Secondary | ICD-10-CM | POA: Diagnosis not present

## 2022-03-30 DIAGNOSIS — H25013 Cortical age-related cataract, bilateral: Secondary | ICD-10-CM | POA: Diagnosis not present

## 2022-03-30 DIAGNOSIS — H18413 Arcus senilis, bilateral: Secondary | ICD-10-CM | POA: Diagnosis not present

## 2022-03-30 DIAGNOSIS — H25043 Posterior subcapsular polar age-related cataract, bilateral: Secondary | ICD-10-CM | POA: Diagnosis not present

## 2022-04-07 ENCOUNTER — Other Ambulatory Visit: Payer: Self-pay | Admitting: Internal Medicine

## 2022-04-20 ENCOUNTER — Ambulatory Visit (HOSPITAL_BASED_OUTPATIENT_CLINIC_OR_DEPARTMENT_OTHER): Payer: Medicare Other | Admitting: Cardiovascular Disease

## 2022-04-20 ENCOUNTER — Encounter (HOSPITAL_BASED_OUTPATIENT_CLINIC_OR_DEPARTMENT_OTHER): Payer: Self-pay | Admitting: Cardiovascular Disease

## 2022-04-20 VITALS — BP 130/80 | HR 64 | Ht 64.0 in | Wt 169.7 lb

## 2022-04-20 DIAGNOSIS — I10 Essential (primary) hypertension: Secondary | ICD-10-CM

## 2022-04-20 DIAGNOSIS — R000 Tachycardia, unspecified: Secondary | ICD-10-CM | POA: Diagnosis not present

## 2022-04-20 DIAGNOSIS — K219 Gastro-esophageal reflux disease without esophagitis: Secondary | ICD-10-CM | POA: Diagnosis not present

## 2022-04-20 DIAGNOSIS — E876 Hypokalemia: Secondary | ICD-10-CM | POA: Diagnosis not present

## 2022-04-20 DIAGNOSIS — Z5181 Encounter for therapeutic drug level monitoring: Secondary | ICD-10-CM | POA: Diagnosis not present

## 2022-04-20 MED ORDER — POTASSIUM CHLORIDE ER 20 MEQ PO TBCR: 20.0000 meq | EXTENDED_RELEASE_TABLET | Freq: Every day | ORAL | 3 refills | Status: DC

## 2022-04-20 MED ORDER — TELMISARTAN-HCTZ 80-25 MG PO TABS: 1.0000 | ORAL_TABLET | Freq: Every day | ORAL | 3 refills | Status: DC

## 2022-04-20 NOTE — Patient Instructions (Addendum)
Medication Instructions:  INCREASE YOUR POTASSIUM TO 20 MEQ DAILY   NEW RX FOR TELMISARTAN HCT HAS BEEN SENT TO [facility]  *If you need a refill on your cardiac medications before your next appointment, please call your pharmacy*  Lab Work: BMET IN 1 WEEK   If you have labs (blood work) drawn today and your tests are completely normal, you will receive your results only by: [place] (if you have MyChart) OR A paper copy in the mail If you have any lab test that is abnormal or we need to change your treatment, we will call you to review the results.  Testing/Procedures: NONE  Follow-Up: At [facility], you and your health needs are our priority.  As part of our continuing mission to provide you with exceptional heart care, we have created designated Provider Care Teams.  These Care Teams include your primary Cardiologist (physician) and Advanced Practice Providers (APPs -  Physician Assistants and Nurse Practitioners) who all work together to provide you with the care you need, when you need it.  We recommend signing up for the patient portal called "MyChart".  Sign up information is provided on this After Visit Summary.  MyChart is used to connect with patients for Virtual Visits (Telemedicine).  Patients are able to view lab/test results, encounter notes, upcoming appointments, etc.  Non-urgent messages can be sent to your provider as well.   To learn more about what you can do with MyChart, go to [email or web address].    Your next appointment:   12 month(s)  The format for your next appointment:   In Person  Provider:   Skeet Latch, MD     IF BLOOD PRESSURE STARTS DROPPING LOW CALL THE OFFICE MAY HAVE TO DECREASE TO 1/2 TELMISARTAN HCT DAILY

## 2022-04-20 NOTE — Progress Notes (Signed)
Cardiology Office Note   Date:  05/05/2022   ID:  Karina Newman, DOB 1956/02/18, MRN [id number]  PCP:  Cassandria Anger, MD  Cardiologist:   Skeet Latch, MD   No chief complaint on file.   History of Present Illness: Karina Newman is a 66 y.o. female with hypertension, autoimmune gastritis, and hyperthyroidism here for follow up.  She was initially seen for the evaluation of chest pain and abnormal EKG.  Karina Newman saw Dr. Alain Marion 04/2018 and reported L sided chest pain.  EKG at that time revealed sinus rhythm with LVH and nonspecific T wave abnormality's.  Karina Newman reported intermittent L sided chest pain that felt like spasm.  She was referred for an exercise Myoview 05/2018 that revealed LVEF 54% with 1 mm ST depression with exertion.  Perfusion imaging showed a small, mild defect in the basal to mid anteroseptal and apical septal regions.  It was unclear whether this represented breast attenuation artifact or ischemia.  TID was 1.2.  She was referred for coronary CT-A 06/2018 that showed no CAD.  Coronary calcium score was 0.    Karina Newman blood pressure remained elevated.  Doxazosin was added to her regimen. She was doing well and reported her PCP had increased her gabapentin. At her last visit, she was grieving the loss of her son who passed away from hypertrophic cardiomyopathy. One of her daughters was also found to have mild cardiomyopathy. She reported continued shortness of breath and chest pain. Echo revealed LVEF 50-55% with a small pericardial effusion and no LVH. 12-day monitor showed predominately sinus rhythm with occasional symptomatic PVCs and 6 beats NSVT. Her blood pressure was well-controlled at home.  At her last visit she reported some atypical pinching chest pain that was thought to be nonischemic.  Her blood pressure was elevated but she thought this was due to her being in pain.  We recommended increasing her exercise and close follow-up.  Karina Newman  reports experiencing lightheadedness when moving too quickly, which requires her to stop or sit down until it subsides. She denies any episodes of syncope. She has been taking the combination of telmisartan and hydrochlorothiazide, as she was previously on this medication. The patient's blood pressure has been well-controlled at home, with occasional elevations to 150/80-90 mmHg, which she attributes to increased pain and pain medication use.  She has been experiencing increased allergy symptoms lately, requiring more frequent use of allergy medications. She is scheduled for cataract surgery in October and hopes that it may help alleviate her headaches, which are predominantly on the same side as her affected eye.  The patient also reports pain in her leg due to varicose veins. She has not tried compression socks for symptom relief but is open to trying them, especially with the colder weather approaching.  Past Medical History:  Diagnosis Date   Abnormal cervical Pap smear with positive HPV DNA test 01/2014,01/2017   2015 Normal cytology with positive high-risk HPV 18/45. Colposcopy negative with negative ECC.  2018 normal cytology with positive high-risk HPV 18/45   Anemia    Apnea 05/17/2018   Autoimmune gastritis    Fatty liver    Gastritis    Heart murmur    Hypertension    Hyperthyroidism    Inappropriate sinus tachycardia 05/17/2018   Internal hemorrhoids    Osteoarthritis    Right shoulder pain 2001   chronic pain   Thyroid disease    Hyperthyroid. Was on PTU (Dr Hampton Abbot in [place]) -  stopped in 2013, stable   Tubular adenoma of colon     Past Surgical History:  Procedure Laterality Date   BIOPSY  03/11/2022   Procedure: BIOPSY;  Surgeon: Rush Landmark Telford Nab., MD;  Location: [facility] ENDOSCOPY;  Service: Gastroenterology;;   BREAST CYST ASPIRATION Left 2012   CESAREAN SECTION     x 4    COLONOSCOPY     ESOPHAGOGASTRODUODENOSCOPY (EGD) WITH PROPOFOL N/A 03/11/2022   Procedure:  ESOPHAGOGASTRODUODENOSCOPY (EGD) WITH PROPOFOL;  Surgeon: Irving Copas., MD;  Location: [place];  Service: Gastroenterology;  Laterality: N/A;   EUS N/A 03/11/2022   Procedure: UPPER ENDOSCOPIC ULTRASOUND (EUS) RADIAL;  Surgeon: Irving Copas., MD;  Location: [place];  Service: Gastroenterology;  Laterality: N/A;   HAND SURGERY Left    SHOULDER SURGERY Right 2002   SHOULDER SURGERY Left 2014   TUBAL LIGATION     UPPER GASTROINTESTINAL ENDOSCOPY       Current Outpatient Medications  Medication Sig Dispense Refill   acetaminophen (TYLENOL) 500 MG tablet Take 1,000 mg by mouth every 6 (six) hours as needed for moderate pain.     amLODipine (NORVASC) 5 MG tablet TAKE 1 TABLET (5 MG TOTAL) BY MOUTH DAILY. 90 tablet 3   aspirin-acetaminophen-caffeine (EXCEDRIN MIGRAINE) 250-250-65 MG tablet Take 2 tablets by mouth every 6 (six) hours as needed for headache.     carvedilol (COREG) 12.5 MG tablet Take 1 tablet (12.5 mg total) by mouth 2 (two) times daily. 180 tablet 3   Cyanocobalamin (VITAMIN B-12) 500 MCG SUBL 1 sl qd 100 tablet 5   cyclobenzaprine (FLEXERIL) 5 MG tablet TAKE 1 TABLET BY MOUTH THREE TIMES A DAY AS NEEDED FOR MUSCLE SPASMS 60 tablet 2   cycloSPORINE (RESTASIS) 0.05 % ophthalmic emulsion Place 1 drop into both eyes 2 (two) times daily.     Diclofenac Sodium 1.5 % SOLN USE 15 DROPS THREE TIMES DAILY EXTERNALLY FOR PAIN 150 mL 5   dicyclomine (BENTYL) 10 MG capsule TAKE 1 CAPSULE (10 MG TOTAL) BY MOUTH EVERY 8 (EIGHT) HOURS AS NEEDED FOR SPASMS. 270 capsule 3   diphenhydrAMINE (BENADRYL) 25 MG tablet Take 25 mg by mouth every 6 (six) hours as needed for allergies.     doxazosin (CARDURA) 4 MG tablet TAKE 1 TABLET BY MOUTH EVERY DAY 90 tablet 1   DULoxetine (CYMBALTA) 20 MG capsule Take 1 capsule (20 mg total) by mouth daily. 90 capsule 5   fluticasone (FLONASE) 50 MCG/ACT nasal spray Place 1 spray into both nostrils as needed. (Patient taking differently:  Place 2 sprays into both nostrils as needed for allergies.) 48 mL 3   gabapentin (NEURONTIN) 100 MG capsule TAKE 1 CAPSULE BY MOUTH THREE TIMES A DAY AS NEEDED 90 capsule 3   LORazepam (ATIVAN) 0.5 MG tablet Take 1 tablet (0.5 mg total) by mouth 2 (two) times daily as needed for anxiety or sleep. (Patient taking differently: Take 0.5 mg by mouth at bedtime.) 60 tablet 1   omeprazole (PRILOSEC) 40 MG capsule TAKE 1 CAPSULE BY MOUTH EVERY DAY IN THE MORNING AND AT BEDTIME 180 capsule 1   oxyCODONE-acetaminophen (PERCOCET/ROXICET) 5-325 MG tablet Take 1 tablet by mouth every 8 (eight) hours as needed for severe pain. 90 tablet 0   oxyCODONE-acetaminophen (PERCOCET/ROXICET) 5-325 MG tablet Take 1 tablet by mouth every 6 (six) hours as needed for severe pain. 90 tablet 0   sucralfate (CARAFATE) 1 g tablet TAKE 1 TABLET BY MOUTH 3 TIMES A DAY BETWEEN MEALS 270  tablet 3   telmisartan-hydrochlorothiazide (MICARDIS HCT) 80-25 MG tablet Take 1 tablet by mouth daily. 90 tablet 3   traZODone (DESYREL) 50 MG tablet TAKE 1 TABLET BY MOUTH EVERYDAY AT BEDTIME 90 tablet 3   valACYclovir (VALTREX) 500 MG tablet Take one tablet po BID x 3 days prn outbreak 30 tablet 1   Vitamin D, Cholecalciferol, 1000 units CAPS Take 2,000 Units by mouth daily.     polyethylene glycol (MIRALAX) 17 g packet Take daily as directed. Increase or decrease as needed (Patient not taking: Reported on 03/04/2022) 14 each 0   potassium chloride 20 MEQ TBCR Take 20 mEq by mouth daily. 90 tablet 3   No current facility-administered medications for this visit.    Allergies:   Penicillins, Tramadol, Iodine, Lexapro [escitalopram], Lyrica [pregabalin], Nsaids, and Voltaren [diclofenac]    Social History:  The patient  reports that she has never smoked. She has never been exposed to tobacco smoke. She has never used smokeless tobacco. She reports current alcohol use. She reports that she does not use drugs.   Family History:  The patient's  family history includes Diabetes in her brother and paternal grandmother; Hypertension in her brother, brother, mother, sister, sister, and son; Kidney disease in her mother and sister; Lung cancer in her father; Sleep apnea in her son.    ROS:   Please see the history of present illness. (+) Stress (+) Chest pain  (+) Low exercise stamina (+) Shortness of breath (+) Depression symptoms (+) Fatigue/Malaise (+) Arthralgia (especially R hip) All other systems are reviewed and negative.    PHYSICAL EXAM: VS:  BP 130/80   Pulse 64   Ht '5\' 4"'$  (1.626 m)   Wt 169 lb 11.2 oz (77 kg)   BMI 29.13 kg/m  , BMI Body mass index is 29.13 kg/m. GENERAL:  Well appearing HEENT: Pupils equal round and reactive, fundi not visualized, oral mucosa unremarkable NECK:  No jugular venous distention, waveform within normal limits, carotid upstroke brisk and symmetric, no bruits, no thyromegaly LUNGS:  Clear to auscultation bilaterally HEART:  RRR.  PMI not displaced or sustained,S1 and S2 within normal limits, no S3, no S4, no clicks, no rubs, no murmurs ABD:  Flat, positive bowel sounds normal in frequency in pitch, no bruits, no rebound, no guarding, no midline pulsatile mass, no hepatomegaly, no splenomegaly EXT:  2 plus pulses throughout, no edema, no cyanosis no clubbing SKIN:  No rashes no nodules NEURO:  Cranial nerves II through XII grossly intact, motor grossly intact throughout PSYCH:  Cognitively intact, oriented to person place and time   EKG:  04/20/22: Sinus rhythm.  Rate 64 bpm.  Nonspecific T wave abnormalities 04/17/2021: Sinus rhythm. Rate 73 bpm. LVH. 04/18/18: sinus rhythm.  Rate 81 bpm. Non-specific ST changes.   Monitor 09/15/21 12 day Zio Monitor   Quality: Fair.  Baseline artifact. Predominant rhythm: Sinus rhythm Average heart rate: 74 bpm Max heart rate: 150 bpm Min heart rate: 49 bpm Pauses >2.5 seconds: none   Patient triggered her monitor several times at which time  sinus rhythm was noted.   Occasional symptomatic PVCs. 6 beats NSVT  Echo 05/07/21  1. Left ventricular ejection fraction, by estimation, is 50 to 55%. The  left ventricle has low normal function. The left ventricle has no regional  wall motion abnormalities. Left ventricular diastolic parameters are  indeterminate.   2. Right ventricular systolic function is normal. The right ventricular  size is normal. There is normal  pulmonary artery systolic pressure.   3. A small pericardial effusion is present. The pericardial effusion is  circumferential. There is no evidence of cardiac tamponade.   4. The mitral valve is normal in structure. Trivial mitral valve  regurgitation. No evidence of mitral stenosis.   5. The aortic valve is tricuspid. Aortic valve regurgitation is not  visualized. No aortic stenosis is present.   6. The inferior vena cava is normal in size with greater than 50%  respiratory variability, suggesting right atrial pressure of 3 mmHg.   Comparison(s): Prior images unable to be directly viewed, comparison made by report only.   US Thyroid 10/31/2019: IMPRESSION: 1. Mildly enlarged, mildly heterogeneous thyroid gland as detailed above. 2. There is a 1.2 cm TR 4 thyroid nodule in the left inferior thyroid gland. A 1 year follow-up ultrasound is recommended for this thyroid nodule. 3. There is a 1.1 cm TR 3 thyroid nodule in the right inferior thyroid gland. No further follow-up is required for this thyroid nodule.  Coronary CT-A 06/26/18: No CAD.  Coronary calcium score 0.  Exercise Myoview 05/30/18: The left ventricular ejection fraction is mildly decreased (45-54%). Nuclear stress EF: 54%. Blood pressure demonstrated a normal response to exercise. There was 25m of horizontal ST depression in the inferolateral leads at peak exercise with T wave inversions in recovery There is a small defect of mild severity present in the basal anteroseptal, mid anteroseptal and  apical septal location. The defect is reversible. Cannot rule out small area of ischemia vs. Variations in breast attenuation artifact. There is transient ischemic dilatation with TID of 1.2 which could indicate multivessel CAD. Consider coronary CTA for further evaulation. This is an intermediate risk study due to increased TID.  Echo 03/22/13: LVEF 55%.  Moderate LVH.  Grade 1 diastolic dysfunction. Trace MR.  Moderate TR.   Recent Labs: 03/01/2022: ALT 39; Hemoglobin 13.3; Platelets 312.0; TSH 0.65 04/28/2022: BUN 13; Creatinine, Ser 0.88; Potassium 4.5; Sodium 141    Lipid Panel    Component Value Date/Time   CHOL 161 12/04/2020 1408   TRIG 101.0 12/04/2020 1408   HDL 40.20 12/04/2020 1408   CHOLHDL 4 12/04/2020 1408   VLDL 20.2 12/04/2020 1408   LDLCALC 101 (H) 12/04/2020 1408     Wt Readings from Last 3 Encounters:  04/20/22 169 lb 11.2 oz (77 kg)  03/11/22 175 lb (79.4 kg)  03/01/22 174 lb (78.9 kg)      ASSESSMENT AND PLAN: HTN (hypertension), benign Blood pressure is generally controlled on amlodipine, carvedilol, doxazosin, telmisartan.   Occasionally in the 150s mmHg but generally controlled.  Higher when she is in pain. Resume telmisartan/HCTZ 80/25.  Increase KCl to 20 mEq.  Repeat BMP in a week.  # Hypokalemia    - Increase potassium supplementation to 20 milliequivalents daily    - Recheck potassium levels after the patient returns from their trip  # Varicose veins    - Recommend wearing compression socks to alleviate discomfort    - Consider referral to vein and vascular specialist if symptoms worsen or do not improve with compression therapy  # Inappropriate sinus tachycardia: Controlled on carvedilol.   Current medicines are reviewed at length with the patient today.  The patient does not have concerns regarding medicines.  The following changes have been made: None  Labs/ tests ordered today include:   Orders Placed This Encounter  Procedures   Basic  Metabolic Panel (BMET)   EKG 12-Lead    Disposition:  FU with Luceil Herrin C. Oval Linsey, MD, [facility] in 1 year.    Signed, Shundra Wirsing C. Oval Linsey, MD, [facility]  05/05/2022 8:58 PM    [place]

## 2022-04-20 NOTE — Assessment & Plan Note (Addendum)
Blood pressure is generally controlled on amlodipine, carvedilol, doxazosin, telmisartan.   Occasionally in the 150s mmHg but generally controlled.  Higher when she is in pain. Resume telmisartan/HCTZ 80/25.  Increase KCl to 20 mEq.  Repeat BMP in a week.

## 2022-04-28 DIAGNOSIS — Z5181 Encounter for therapeutic drug level monitoring: Secondary | ICD-10-CM | POA: Diagnosis not present

## 2022-04-28 DIAGNOSIS — I10 Essential (primary) hypertension: Secondary | ICD-10-CM | POA: Diagnosis not present

## 2022-04-29 LAB — BASIC METABOLIC PANEL
BUN/Creatinine Ratio: 15 (ref 12–28)
BUN: 13 mg/dL (ref 8–27)
CO2: 22 mmol/L (ref 20–29)
Calcium: 9.9 mg/dL (ref 8.7–10.3)
Chloride: 101 mmol/L (ref 96–106)
Creatinine, Ser: 0.88 mg/dL (ref 0.57–1.00)
Glucose: 90 mg/dL (ref 70–99)
Potassium: 4.5 mmol/L (ref 3.5–5.2)
Sodium: 141 mmol/L (ref 134–144)
eGFR: 73 mL/min/{1.73_m2} (ref >59)

## 2022-05-05 ENCOUNTER — Encounter (HOSPITAL_BASED_OUTPATIENT_CLINIC_OR_DEPARTMENT_OTHER): Payer: Self-pay | Admitting: Cardiovascular Disease

## 2022-05-10 ENCOUNTER — Telehealth: Payer: Self-pay | Admitting: Internal Medicine

## 2022-05-10 NOTE — Telephone Encounter (Signed)
LVM for pt tp rtn my call to schedule AWV with [facility] call back # [phone]

## 2022-05-27 ENCOUNTER — Other Ambulatory Visit: Payer: Self-pay | Admitting: Internal Medicine

## 2022-06-07 ENCOUNTER — Encounter: Payer: Self-pay | Admitting: Internal Medicine

## 2022-06-07 ENCOUNTER — Ambulatory Visit (INDEPENDENT_AMBULATORY_CARE_PROVIDER_SITE_OTHER): Payer: Medicare Other

## 2022-06-07 ENCOUNTER — Ambulatory Visit (INDEPENDENT_AMBULATORY_CARE_PROVIDER_SITE_OTHER): Payer: Medicare Other | Admitting: Internal Medicine

## 2022-06-07 VITALS — BP 128/80 | HR 76 | Temp 98.2°F | Ht 64.0 in | Wt 166.2 lb

## 2022-06-07 DIAGNOSIS — S43431S Superior glenoid labrum lesion of right shoulder, sequela: Secondary | ICD-10-CM

## 2022-06-07 DIAGNOSIS — M5010 Cervical disc disorder with radiculopathy, unspecified cervical region: Secondary | ICD-10-CM | POA: Diagnosis not present

## 2022-06-07 DIAGNOSIS — Z0000 Encounter for general adult medical examination without abnormal findings: Secondary | ICD-10-CM | POA: Diagnosis not present

## 2022-06-07 DIAGNOSIS — M5030 Other cervical disc degeneration, unspecified cervical region: Secondary | ICD-10-CM

## 2022-06-07 DIAGNOSIS — R7989 Other specified abnormal findings of blood chemistry: Secondary | ICD-10-CM

## 2022-06-07 DIAGNOSIS — M25511 Pain in right shoulder: Secondary | ICD-10-CM

## 2022-06-07 DIAGNOSIS — B9689 Other specified bacterial agents as the cause of diseases classified elsewhere: Secondary | ICD-10-CM | POA: Diagnosis not present

## 2022-06-07 DIAGNOSIS — J329 Chronic sinusitis, unspecified: Secondary | ICD-10-CM | POA: Diagnosis not present

## 2022-06-07 MED ORDER — OXYCODONE-ACETAMINOPHEN 5-325 MG PO TABS: 1.0000 | ORAL_TABLET | Freq: Three times a day (TID) | ORAL | 0 refills | Status: DC | PRN

## 2022-06-07 MED ORDER — GABAPENTIN 100 MG PO CAPS: ORAL_CAPSULE | ORAL | 3 refills | Status: DC

## 2022-06-07 MED ORDER — OXYCODONE-ACETAMINOPHEN 5-325 MG PO TABS: 1.0000 | ORAL_TABLET | Freq: Four times a day (QID) | ORAL | 0 refills | Status: DC | PRN

## 2022-06-07 MED ORDER — DICLOFENAC SODIUM 1.5 % EX SOLN: CUTANEOUS | 5 refills | Status: DC

## 2022-06-07 MED ORDER — CEFDINIR 300 MG PO CAPS: 300.0000 mg | ORAL_CAPSULE | Freq: Two times a day (BID) | ORAL | 0 refills | Status: DC

## 2022-06-07 NOTE — Progress Notes (Addendum)
Subjective:   Karina Newman is a 66 y.o. female who presents for Medicare Annual (Subsequent) preventive examination.  Review of Systems     Cardiac Risk Factors include: advanced age (>33mn, >>31women);hypertension;family history of premature cardiovascular disease;sedentary lifestyle     Objective:    Today's Vitals   06/07/22 1410  BP: 128/80  Pulse: 76  Temp: 98.2 F (36.8 C)  SpO2: 95%  Weight: 166 lb 3.6 oz (75.4 kg)  Height: '5\' 4"'$  (1.626 m)  PainSc: 10-Worst pain ever  PainLoc: Back   Body mass index is 28.53 kg/m.     06/07/2022    2:16 PM 03/11/2022    7:47 AM 03/31/2015    4:24 PM  Advanced Directives  Does Patient Have a Medical Advance Directive? No No No  Would patient like information on creating a medical advance directive? No - Patient declined No - Guardian declined     Current Medications (verified) Outpatient Encounter Medications as of 06/07/2022  Medication Sig   acetaminophen (TYLENOL) 500 MG tablet Take 1,000 mg by mouth every 6 (six) hours as needed for moderate pain.   amLODipine (NORVASC) 5 MG tablet TAKE 1 TABLET (5 MG TOTAL) BY MOUTH DAILY.   aspirin-acetaminophen-caffeine (EXCEDRIN MIGRAINE) 250-250-65 MG tablet Take 2 tablets by mouth every 6 (six) hours as needed for headache.   carvedilol (COREG) 12.5 MG tablet Take 1 tablet (12.5 mg total) by mouth 2 (two) times daily.   Cyanocobalamin (VITAMIN B-12) 500 MCG SUBL 1 sl qd   cyclobenzaprine (FLEXERIL) 5 MG tablet TAKE 1 TABLET BY MOUTH THREE TIMES A DAY AS NEEDED FOR MUSCLE SPASMS   cycloSPORINE (RESTASIS) 0.05 % ophthalmic emulsion Place 1 drop into both eyes 2 (two) times daily.   dicyclomine (BENTYL) 10 MG capsule TAKE 1 CAPSULE (10 MG TOTAL) BY MOUTH EVERY 8 (EIGHT) HOURS AS NEEDED FOR SPASMS.   diphenhydrAMINE (BENADRYL) 25 MG tablet Take 25 mg by mouth every 6 (six) hours as needed for allergies.   doxazosin (CARDURA) 4 MG tablet TAKE 1 TABLET BY MOUTH EVERY DAY   DULoxetine  (CYMBALTA) 20 MG capsule Take 1 capsule (20 mg total) by mouth daily.   fluticasone (FLONASE) 50 MCG/ACT nasal spray Place 1 spray into both nostrils as needed. (Patient taking differently: Place 2 sprays into both nostrils as needed for allergies.)   gabapentin (NEURONTIN) 100 MG capsule TAKE 1 CAPSULE BY MOUTH THREE TIMES A DAY AS NEEDED   LORazepam (ATIVAN) 0.5 MG tablet Take 1 tablet (0.5 mg total) by mouth 2 (two) times daily as needed for anxiety or sleep. (Patient taking differently: Take 0.5 mg by mouth at bedtime.)   omeprazole (PRILOSEC) 40 MG capsule TAKE 1 CAPSULE BY MOUTH EVERY DAY IN THE MORNING AND AT BEDTIME   polyethylene glycol (MIRALAX) 17 g packet Take daily as directed. Increase or decrease as needed   potassium chloride 20 MEQ TBCR Take 20 mEq by mouth daily.   sucralfate (CARAFATE) 1 g tablet TAKE 1 TABLET BY MOUTH 3 TIMES A DAY BETWEEN MEALS   telmisartan-hydrochlorothiazide (MICARDIS HCT) 80-25 MG tablet Take 1 tablet by mouth daily.   traZODone (DESYREL) 50 MG tablet TAKE 1 TABLET BY MOUTH EVERYDAY AT BEDTIME   valACYclovir (VALTREX) 500 MG tablet Take one tablet po BID x 3 days prn outbreak   Vitamin D, Cholecalciferol, 1000 units CAPS Take 2,000 Units by mouth daily.   No facility-administered encounter medications on file as of 06/07/2022.    Allergies (verified) Penicillins,  Tramadol, Iodine, Lexapro [escitalopram], Lyrica [pregabalin], Nsaids, and Voltaren [diclofenac]   History: Past Medical History:  Diagnosis Date   Abnormal cervical Pap smear with positive HPV DNA test 01/2014,01/2017   2015 Normal cytology with positive high-risk HPV 18/45. Colposcopy negative with negative ECC.  2018 normal cytology with positive high-risk HPV 18/45   Anemia    Apnea 05/17/2018   Autoimmune gastritis    Fatty liver    Gastritis    Heart murmur    Hypertension    Hyperthyroidism    Inappropriate sinus tachycardia 05/17/2018   Internal hemorrhoids    Osteoarthritis     Right shoulder pain 2001   chronic pain   Thyroid disease    Hyperthyroid. Was on PTU (Dr Hampton Abbot in [place]) - stopped in 2013, stable   Tubular adenoma of colon    Past Surgical History:  Procedure Laterality Date   BIOPSY  03/11/2022   Procedure: BIOPSY;  Surgeon: Rush Landmark Telford Nab., MD;  Location: [facility];  Service: Gastroenterology;;   BREAST CYST ASPIRATION Left 2012   CESAREAN SECTION     x 4    COLONOSCOPY     ESOPHAGOGASTRODUODENOSCOPY (EGD) WITH PROPOFOL N/A 03/11/2022   Procedure: ESOPHAGOGASTRODUODENOSCOPY (EGD) WITH PROPOFOL;  Surgeon: Irving Copas., MD;  Location: [facility];  Service: Gastroenterology;  Laterality: N/A;   EUS N/A 03/11/2022   Procedure: UPPER ENDOSCOPIC ULTRASOUND (EUS) RADIAL;  Surgeon: Irving Copas., MD;  Location: [facility];  Service: Gastroenterology;  Laterality: N/A;   HAND SURGERY Left    SHOULDER SURGERY Right 2002   SHOULDER SURGERY Left 2014   TUBAL LIGATION     UPPER GASTROINTESTINAL ENDOSCOPY     Family History  Problem Relation Age of Onset   Kidney disease Mother        ESRD   Hypertension Mother    Lung cancer Father    Hypertension Sister    Kidney disease Sister    Hypertension Sister    Diabetes Brother    Hypertension Brother    Hypertension Brother    Diabetes Paternal Grandmother    Sleep apnea Son    Hypertension Son    Colon cancer Neg Hx    Stomach cancer Neg Hx    Esophageal cancer Neg Hx    Rectal cancer Neg Hx    Pancreatic cancer Neg Hx    Social History   Socioeconomic History   Marital status: Married    Spouse name: Not on file   Number of children: 4   Years of education: Not on file   Highest education level: Not on file  Occupational History   Occupation: RETIRED  Tobacco Use   Smoking status: Never    Passive exposure: Never   Smokeless tobacco: Never  Vaping Use   Vaping Use: Never used  Substance and Sexual Activity   Alcohol use: Yes    Comment: RARELY    Drug use: No   Sexual activity: Yes    Birth control/protection: Post-menopausal, Surgical    Comment: BTL-1st intercourse 16 yo-5 partners  Other Topics Concern   Not on file  Social History Narrative   Not on file   Social Determinants of Health   Financial Resource Strain: Low Risk  (06/07/2022)   Overall Financial Resource Strain (CARDIA)    Difficulty of Paying Living Expenses: Not hard at all  Food Insecurity: No Food Insecurity (06/07/2022)   Hunger Vital Sign    Worried About Running Out of Food in the  Last Year: Never true    [place] in the Last Year: Never true  Transportation Needs: No Transportation Needs (06/07/2022)   PRAPARE - Hydrologist (Medical): No    Lack of Transportation (Non-Medical): No  Physical Activity: Inactive (06/07/2022)   Exercise Vital Sign    Days of Exercise per Week: 0 days    Minutes of Exercise per Session: 0 min  Stress: No Stress Concern Present (06/07/2022)   [place]    Feeling of Stress : Not at all  Social Connections: [place] (06/07/2022)   Social Connection and Isolation Panel [NHANES]    Frequency of Communication with Friends and Family: More than three times a week    Frequency of Social Gatherings with Friends and Family: More than three times a week    Attends Religious Services: More than 4 times per year    Active Member of [employer] or Organizations: Yes    Attends Music therapist: More than 4 times per year    Marital Status: Married    Tobacco Counseling Counseling given: Not Answered   Clinical Intake:  Pre-visit preparation completed: Yes  Pain : No/denies pain Pain Score: 10-Worst pain ever     BMI - recorded: 28.53 Nutritional Status: BMI 25 -29 Overweight Nutritional Risks: None Diabetes: No  How often do you need to have someone help you when you read instructions,  pamphlets, or other written materials from your doctor or pharmacy?: 1 - Never What is the last grade level you completed in school?: HSG  Diabetic? no  Interpreter Needed?: No  Information entered by :: Lisette Abu, LPN.   Activities of Daily Living    06/07/2022    2:17 PM  In your present state of health, do you have any difficulty performing the following activities:  Hearing? 0  Vision? 0  Difficulty concentrating or making decisions? 0  Walking or climbing stairs? 1  Dressing or bathing? 0  Doing errands, shopping? 0  Preparing Food and eating ? N  Using the Toilet? N  In the past six months, have you accidently leaked urine? N  Do you have problems with loss of bowel control? N  Managing your Medications? N  Managing your Finances? N  Housekeeping or managing your Housekeeping? N    Patient Care Team: Plotnikov, Evie Lacks, MD as PCP - General (Internal Medicine) Skeet Latch, MD as PCP - Cardiology (Cardiology) Bo Merino, MD as Consulting Physician (Rheumatology) Stephannie Li, [place] as Consulting Physician (Ophthalmology)  Indicate any recent Medical Services you may have received from other than [facility] providers in the past year (date may be approximate).     Assessment:   This is a routine wellness examination for [place].  Hearing/Vision screen Hearing Screening - Comments:: Denies hearing difficulties   Vision Screening - Comments:: Wears rx glasses - up to date with routine eye exams with Stephannie Li, OD.   Dietary issues and exercise activities discussed: Current Exercise Habits: The patient does not participate in regular exercise at present, Exercise limited by: orthopedic condition(s);psychological condition(s)   Goals Addressed             This Visit's Progress    To eliminate my back pain.        Depression Screen    06/07/2022    1:25 PM 09/28/2021    1:22 PM 09/17/2020    4:12 PM 05/12/2020  2:00 PM 01/23/2019     2:01 PM 12/23/2016   10:27 AM  PHQ 2/9 Scores  PHQ - 2 Score 3 2  0 1 0  PHQ- 9 Score '6 4   2   '$ Exception Documentation   Patient refusal       Fall Risk    06/07/2022    1:24 PM 09/28/2021    1:22 PM 03/18/2021    1:31 PM 12/04/2020    1:32 PM  [place] in the past year? 0 0 0 0  Number falls in past yr: 0 0 0 0  Injury with Fall? 0 0 0 0  Risk for fall due to : No Fall Risks No Fall Risks No Fall Risks No Fall Risks  Follow up  Falls evaluation completed      FALL RISK PREVENTION PERTAINING TO THE HOME:  Any stairs in or around the home? Yes  If so, are there any without handrails? No  Home free of loose throw rugs in walkways, pet beds, electrical cords, etc? Yes  Adequate lighting in your home to reduce risk of falls? Yes   ASSISTIVE DEVICES UTILIZED TO PREVENT FALLS:  Life alert? No  Use of a cane, walker or w/c? No  Grab bars in the bathroom? No  Shower chair or bench in shower? No  Elevated toilet seat or a handicapped toilet? No   TIMED UP AND GO:  Was the test performed? Yes .  Length of time to ambulate 10 feet: 10 sec.   Gait slow and steady without use of assistive device  Cognitive Function:        06/07/2022    2:18 PM  6CIT Screen  What Year? 0 points  What month? 0 points  What time? 0 points  Count back from 20 0 points  Months in reverse 0 points  Repeat phrase 0 points  Total Score 0 points    Immunizations Immunization History  Administered Date(s) Administered   Tdap 10/17/2021   Zoster Recombinat (Shingrix) 03/25/2017, 06/27/2017    TDAP status: Up to date  Flu Vaccine status: Declined, Education has been provided regarding the importance of this vaccine but patient still declined. Advised may receive this vaccine at local pharmacy or Health Dept. Aware to provide a copy of the vaccination record if obtained from local pharmacy or Health Dept. Verbalized acceptance and understanding.  Pneumococcal vaccine status:  Declined,  Education has been provided regarding the importance of this vaccine but patient still declined. Advised may receive this vaccine at local pharmacy or Health Dept. Aware to provide a copy of the vaccination record if obtained from local pharmacy or Health Dept. Verbalized acceptance and understanding.   Covid-19 vaccine status: Declined, Education has been provided regarding the importance of this vaccine but patient still declined. Advised may receive this vaccine at local pharmacy or Health Dept.or vaccine clinic. Aware to provide a copy of the vaccination record if obtained from local pharmacy or Health Dept. Verbalized acceptance and understanding.  Qualifies for Shingles Vaccine? Yes   Zostavax completed No   Shingrix Completed?: Yes  Screening Tests Health Maintenance  Topic Date Due   HIV Screening  Never done   COLONOSCOPY (Pts 45-76yr Insurance coverage will need to be confirmed)  05/06/2021   Pneumonia Vaccine 65 Years old (1 - PCV) Never done   DEXA SCAN  Never done   MAMMOGRAM  04/04/2023   PAP SMEAR-Modifier  04/10/2024   TETANUS/TDAP  10/18/2031  Hepatitis C Screening  Completed   Zoster Vaccines- Shingrix  Completed   HPV VACCINES  Aged Out   INFLUENZA VACCINE  Discontinued   COVID-19 Vaccine  Discontinued    Health Maintenance  Health Maintenance Due  Topic Date Due   HIV Screening  Never done   COLONOSCOPY (Pts 45-33yr Insurance coverage will need to be confirmed)  05/06/2021   Pneumonia Vaccine 65 Years old (1 - PCV) Never done   DEXA SCAN  Never done    Colorectal cancer screening: Type of screening: Colonoscopy. Completed 05/06/2016. Repeat every 5 years  Mammogram status: Completed 04/03/2021. Repeat every year  Bone Density status: never done  Lung Cancer Screening: (Low Dose CT Chest recommended if Age 65-80years, 30 pack-year currently smoking OR have quit w/in 15years.) does not qualify.   Lung Cancer Screening Referral:  no  Additional Screening:  Hepatitis C Screening: does qualify; Completed 01/19/2017  Vision Screening: Recommended annual ophthalmology exams for early detection of glaucoma and other disorders of the eye. Is the patient up to date with their annual eye exam?  Yes  Who is the provider or what is the name of the office in which the patient attends annual eye exams? AStephannie Li OD. If pt is not established with a provider, would they like to be referred to a provider to establish care? No .   Dental Screening: Recommended annual dental exams for proper oral hygiene  Community Resource Referral / Chronic Care Management: CRR required this visit?  No   CCM required this visit?  No      Plan:     I have personally reviewed and noted the following in the patient's chart:   Medical and social history Use of alcohol, tobacco or illicit drugs  Current medications and supplements including opioid prescriptions. Patient is currently taking opioid prescriptions. Information provided to patient regarding non-opioid alternatives. Patient advised to discuss non-opioid treatment plan with their provider. Functional ability and status Nutritional status Physical activity Advanced directives List of other physicians Hospitalizations, surgeries, and ER visits in previous 12 months Vitals Screenings to include cognitive, depression, and falls Referrals and appointments  In addition, I have reviewed and discussed with patient certain preventive protocols, quality metrics, and best practice recommendations. A written personalized care plan for preventive services as well as general preventive health recommendations were provided to patient.     SSheral Flow LPN   [id number]  Nurse Notes: N/A  Medical screening examination/treatment/procedure(s) were performed by non-physician practitioner and as supervising physician I was immediately available for consultation/collaboration.  I  agree with above. ALew Dawes MD
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
## 2022-06-07 NOTE — Assessment & Plan Note (Signed)
[employer] x 10 d
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
## 2022-06-07 NOTE — Assessment & Plan Note (Signed)
Cont on Vit B12 

## 2022-06-07 NOTE — Patient Instructions (Signed)
Karina Newman , Thank you for taking time to come for your Medicare Wellness Visit. I appreciate your ongoing commitment to your health goals. Please review the following plan we discussed and let me know if I can assist you in the future.   These are the goals we discussed:  Goals      To eliminate my back pain.        This is a list of the screening recommended for you and due dates:  Health Maintenance  Topic Date Due   HIV Screening  Never done   Colon Cancer Screening  05/06/2021   Pneumonia Vaccine (1 - PCV) Never done   DEXA scan (bone density measurement)  Never done   Mammogram  04/04/2023   Pap Smear  04/10/2024   Tetanus Vaccine  10/18/2031   Hepatitis C Screening: USPSTF Recommendation to screen - Ages 18-79 yo.  Completed   Zoster (Shingles) Vaccine  Completed   HPV Vaccine  Aged Out   Flu Shot  Discontinued   COVID-19 Vaccine  Discontinued    Advanced directives: No  Conditions/risks identified: Yes  Next appointment: Follow up in one year for your annual wellness visit.   Preventive Care 65 Years and Older, Female Preventive care refers to lifestyle choices and visits with your health care provider that can promote health and wellness. What does preventive care include? A yearly physical exam. This is also called an annual well check. Dental exams once or twice a year. Routine eye exams. Ask your health care provider how often you should have your eyes checked. Personal lifestyle choices, including: Daily care of your teeth and gums. Regular physical activity. Eating a healthy diet. Avoiding tobacco and drug use. Limiting alcohol use. Practicing safe sex. Taking low-dose aspirin every day. Taking vitamin and mineral supplements as recommended by your health care provider. What happens during an annual well check? The services and screenings done by your health care provider during your annual well check will depend on your age, overall health, lifestyle  risk factors, and family history of disease. Counseling  Your health care provider may ask you questions about your: Alcohol use. Tobacco use. Drug use. Emotional well-being. Home and relationship well-being. Sexual activity. Eating habits. History of falls. Memory and ability to understand (cognition). Work and work Statistician. Reproductive health. Screening  You may have the following tests or measurements: Height, weight, and BMI. Blood pressure. Lipid and cholesterol levels. These may be checked every 5 years, or more frequently if you are over 65 years old. Skin check. Lung cancer screening. You may have this screening every year starting at age 65 if you have a 30-pack-year history of smoking and currently smoke or have quit within the past 15 years. Fecal occult blood test (FOBT) of the stool. You may have this test every year starting at age 65. Flexible sigmoidoscopy or colonoscopy. You may have a sigmoidoscopy every 5 years or a colonoscopy every 10 years starting at age 65. Hepatitis C blood test. Hepatitis B blood test. Sexually transmitted disease (STD) testing. Diabetes screening. This is done by checking your blood sugar (glucose) after you have not eaten for a while (fasting). You may have this done every 1-3 years. Bone density scan. This is done to screen for osteoporosis. You may have this done starting at age 65. Mammogram. This may be done every 1-2 years. Talk to your health care provider about how often you should have regular mammograms. Talk with your health care provider  about your test results, treatment options, and if necessary, the need for more tests. Vaccines  Your health care provider may recommend certain vaccines, such as: Influenza vaccine. This is recommended every year. Tetanus, diphtheria, and acellular pertussis (Tdap, Td) vaccine. You may need a Td booster every 10 years. Zoster vaccine. You may need this after age 65. Pneumococcal  13-valent conjugate (PCV13) vaccine. One dose is recommended after age 65. Pneumococcal polysaccharide (PPSV23) vaccine. One dose is recommended after age 65. Talk to your health care provider about which screenings and vaccines you need and how often you need them. This information is not intended to replace advice given to you by your health care provider. Make sure you discuss any questions you have with your health care provider. Document Released: 08/29/2015 Document Revised: 04/21/2016 Document Reviewed: 06/03/2015 Elsevier Interactive Patient Education  2017 [place] Prevention in the Home Falls can cause injuries. They can happen to people of all ages. There are many things you can do to make your home safe and to help prevent falls. What can I do on the outside of my home? Regularly fix the edges of walkways and driveways and fix any cracks. Remove anything that might make you trip as you walk through a door, such as a raised step or threshold. Trim any bushes or trees on the path to your home. Use bright outdoor lighting. Clear any walking paths of anything that might make someone trip, such as rocks or tools. Regularly check to see if handrails are loose or broken. Make sure that both sides of any steps have handrails. Any raised decks and porches should have guardrails on the edges. Have any leaves, snow, or ice cleared regularly. Use sand or salt on walking paths during winter. Clean up any spills in your garage right away. This includes oil or grease spills. What can I do in the bathroom? Use night lights. Install grab bars by the toilet and in the tub and shower. Do not use towel bars as grab bars. Use non-skid mats or decals in the tub or shower. If you need to sit down in the shower, use a plastic, non-slip stool. Keep the floor dry. Clean up any water that spills on the floor as soon as it happens. Remove soap buildup in the tub or shower regularly. Attach  bath mats securely with double-sided non-slip rug tape. Do not have throw rugs and other things on the floor that can make you trip. What can I do in the bedroom? Use night lights. Make sure that you have a light by your bed that is easy to reach. Do not use any sheets or blankets that are too big for your bed. They should not hang down onto the floor. Have a firm chair that has side arms. You can use this for support while you get dressed. Do not have throw rugs and other things on the floor that can make you trip. What can I do in the kitchen? Clean up any spills right away. Avoid walking on wet floors. Keep items that you use a lot in easy-to-reach places. If you need to reach something above you, use a strong step stool that has a grab bar. Keep electrical cords out of the way. Do not use floor polish or wax that makes floors slippery. If you must use wax, use non-skid floor wax. Do not have throw rugs and other things on the floor that can make you trip. What can I do  with my stairs? Do not leave any items on the stairs. Make sure that there are handrails on both sides of the stairs and use them. Fix handrails that are broken or loose. Make sure that handrails are as long as the stairways. Check any carpeting to make sure that it is firmly attached to the stairs. Fix any carpet that is loose or worn. Avoid having throw rugs at the top or bottom of the stairs. If you do have throw rugs, attach them to the floor with carpet tape. Make sure that you have a light switch at the top of the stairs and the bottom of the stairs. If you do not have them, ask someone to add them for you. What else can I do to help prevent falls? Wear shoes that: Do not have high heels. Have rubber bottoms. Are comfortable and fit you well. Are closed at the toe. Do not wear sandals. If you use a stepladder: Make sure that it is fully opened. Do not climb a closed stepladder. Make sure that both sides of the  stepladder are locked into place. Ask someone to hold it for you, if possible. Clearly mark and make sure that you can see: Any grab bars or handrails. First and last steps. Where the edge of each step is. Use tools that help you move around (mobility aids) if they are needed. These include: Canes. Walkers. Scooters. Crutches. Turn on the lights when you go into a dark area. Replace any light bulbs as soon as they burn out. Set up your furniture so you have a clear path. Avoid moving your furniture around. If any of your floors are uneven, fix them. If there are any pets around you, be aware of where they are. Review your medicines with your doctor. Some medicines can make you feel dizzy. This can increase your chance of falling. Ask your doctor what other things that you can do to help prevent falls. This information is not intended to replace advice given to you by your health care provider. Make sure you discuss any questions you have with your health care provider. Document Released: 05/29/2009 Document Revised: 01/08/2016 Document Reviewed: 09/06/2014 Elsevier Interactive Patient Education  2017 [employer].
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
## 2022-06-14 DIAGNOSIS — H2511 Age-related nuclear cataract, right eye: Secondary | ICD-10-CM | POA: Diagnosis not present

## 2022-06-15 DIAGNOSIS — H2512 Age-related nuclear cataract, left eye: Secondary | ICD-10-CM | POA: Diagnosis not present

## 2022-06-15 HISTORY — PX: CATARACT EXTRACTION: SUR2

## 2022-06-16 ENCOUNTER — Other Ambulatory Visit: Payer: Self-pay | Admitting: Internal Medicine

## 2022-06-16 DIAGNOSIS — E876 Hypokalemia: Secondary | ICD-10-CM

## 2022-06-28 DIAGNOSIS — H2512 Age-related nuclear cataract, left eye: Secondary | ICD-10-CM | POA: Diagnosis not present

## 2022-06-28 HISTORY — PX: CATARACT EXTRACTION: SUR2

## 2022-07-21 ENCOUNTER — Other Ambulatory Visit (HOSPITAL_BASED_OUTPATIENT_CLINIC_OR_DEPARTMENT_OTHER): Payer: Self-pay | Admitting: Family

## 2022-09-03 ENCOUNTER — Ambulatory Visit (INDEPENDENT_AMBULATORY_CARE_PROVIDER_SITE_OTHER)

## 2022-09-03 ENCOUNTER — Ambulatory Visit (INDEPENDENT_AMBULATORY_CARE_PROVIDER_SITE_OTHER): Admitting: Physician Assistant

## 2022-09-03 ENCOUNTER — Encounter: Payer: Self-pay | Admitting: Physician Assistant

## 2022-09-03 DIAGNOSIS — G8929 Other chronic pain: Secondary | ICD-10-CM

## 2022-09-03 DIAGNOSIS — M542 Cervicalgia: Secondary | ICD-10-CM | POA: Diagnosis not present

## 2022-09-03 DIAGNOSIS — M25511 Pain in right shoulder: Secondary | ICD-10-CM | POA: Diagnosis not present

## 2022-09-03 NOTE — Progress Notes (Signed)
Office Visit Note   Patient: Karina Newman           Date of Birth: [birth year]           MRN: [id number] Visit Date: 09/03/2022              Requested by: Cassandria Anger, MD [place],  [place] [address] PCP: Cassandria Anger, MD  Chief Complaint  Patient presents with   Neck - Pain   Right Shoulder - Pain      HPI: Karina Newman comes in today complaining of neck and right shoulder pain.  She has a history of a work comp injury from 2001.  This occurred in [place].  She says her neck pain goes from her neck and shoots down her right shoulder with some associated paresthesias sometimes in her right hand.  While she has flares of this on and off for years it seems to have gotten worse.  She also has a history of right shoulder pain.  She has had surgery she said on her right shoulder.  She says she finds it difficult to wash her hair and lift her arm up.  She is on chronic pain management  Assessment & Plan: Visit Diagnoses:  1. Chronic right shoulder pain   2. Neck pain   3. Cervicalgia     Plan: Findings exam wise indicate problems in the shoulder and the neck.  I think the neck is more symptomatic at the time.  She did relate to me that many years ago she did have epidural steroid injections in her neck and that relieved 85 to 90% of her symptoms.  Also improved her neck range of motion which is very limited today.  I will go forward with a new MRI and she can follow-up with Barnet Pall or Dr. Ernestina Patches to discuss if epidural steroid injection is an option for her.  In the meantime I will also prescribe physical therapy for her right shoulder.  She has a lot of stiffness.  Some point he may need a updated MRI in her right shoulder as well  Follow-Up Instructions: After MRI  Ortho Exam  Patient is alert, oriented, no adenopathy, well-dressed, normal affect, normal respiratory effort. Examination of her right shoulder she has forward elevation to about 120  degrees.  Difficulty with internal rotation behind her back.  Examination she is a family stiff in her neck.  She does very little forward flexion and extension this repeat reproduces spasm in her right shoulder going down her arm.  Her pulses are intact she currently has good sensation in her hand she has good grip strength  Imaging: XR Cervical Spine 2 or 3 views  Result Date: 09/03/2022 Graphs of her cervical spine demonstrate loss of normal lordotic curve.  She does have degenerative changes with osteophyte formation and calcification from C4-C7  XR Shoulder Right  Result Date: 09/03/2022 Examination of her shoulder demonstrates well-maintained alignment no evidence of dislocation no significant degenerative changes  No images are attached to the encounter.  Labs: Lab Results  Component Value Date   ESRSEDRATE 19 10/21/2021   ESRSEDRATE 22 09/12/2017   CRP 3.1 10/21/2021   LABURIC 5.3 10/24/2018     Lab Results  Component Value Date   ALBUMIN 4.5 03/01/2022   ALBUMIN 4.4 07/24/2021   ALBUMIN 4.1 12/04/2020    No results found for: "MG" Lab Results  Component Value Date   VD25OH 76.73 12/04/2020   VD25OH  49.39 09/12/2017   VD25OH 21.13 (L) 10/16/2015    No results found for: "PREALBUMIN"    Latest Ref Rng & Units 03/01/2022    1:59 PM 07/24/2021    2:29 PM 12/04/2020    2:08 PM  CBC EXTENDED  WBC 4.0 - 10.5 K/uL 5.2  6.0  5.0   RBC 3.87 - 5.11 Mil/uL 4.15  4.16  4.02   Hemoglobin 12.0 - 15.0 g/dL 13.3  13.2  13.0   HCT 36.0 - 46.0 % 39.9  38.9  38.2   Platelets 150.0 - 400.0 K/uL 312.0  326  307.0   NEUT# 1.4 - 7.7 K/uL 2.9   3.1   Lymph# 0.7 - 4.0 K/uL 1.8   1.5      There is no height or weight on file to calculate BMI.  Orders:  Orders Placed This Encounter  Procedures   XR Cervical Spine 2 or 3 views   XR Shoulder Right   MR Cervical Spine w/o contrast   Ambulatory referral to Physical Therapy   Ambulatory referral to Physical Medicine Rehab   No  orders of the defined types were placed in this encounter.    Procedures: No procedures performed  Clinical Data: No additional findings.  ROS:  All other systems negative, except as noted in the HPI. Review of Systems  Objective: Vital Signs: There were no vitals taken for this visit.  Specialty Comments:  No specialty comments available.  PMFS History: Patient Active Problem List   Diagnosis Date Noted   Gastritis 03/01/2022   Toe pain, chronic, right 09/17/2020   Grief at loss of child 07/24/2020   Dysphagia 01/02/2020   Globus sensation 01/02/2020   Thoracic back pain 12/19/2019   Sore throat 10/24/2019   Goiter 10/24/2019   Cough 10/24/2019   Neoplasm of uncertain behavior of skin 09/27/2019   Pain of right breast 06/03/2019   Fatty liver 01/23/2019   Hand pain 09/28/2018   Knee pain 09/28/2018   Inappropriate sinus tachycardia 05/17/2018   Apnea 05/17/2018   Upper respiratory infection 09/23/2017   Fatigue 09/12/2017   Well adult exam 01/19/2017   Abdominal pain 01/19/2017   Headache 10/27/2016   Pain in right shoulder 09/16/2016   Left hip pain 09/16/2016   Low vitamin B12 level 03/16/2016   Piriformis syndrome of right side 02/04/2016   IT band syndrome 02/04/2016   RUQ abdominal pain 02/04/2016   Low back pain radiating to right lower extremity 09/19/2015   Allergic rhinitis 09/19/2015   Atypical chest pain 04/07/2015   GERD (gastroesophageal reflux disease) 04/07/2015   Axillary adenitis 03/23/2015   Migraine headache 03/23/2015   Hives 01/29/2015   Edema 01/29/2015   Cervical disc disorder with radiculopathy of cervical region 12/11/2014   Insomnia 07/31/2014   Degenerative cervical disc 05/08/2014   Pain in joint, shoulder region 03/05/2014   Patellofemoral arthralgia of both knees 03/01/2014   Bilateral shoulder pain 10/18/2013   Sinusitis, bacterial 08/20/2013   URI, acute 08/14/2013   Hemoptysis 08/14/2013   Otitis media of left ear  08/14/2013   Labral tear of shoulder 07/03/2013   Hyperthyroidism 07/03/2013   HTN (hypertension), benign 07/03/2013   LVH (left ventricular hypertrophy) due to hypertensive disease 07/03/2013   Vitamin D deficiency 07/03/2013   Past Medical History:  Diagnosis Date   Abnormal cervical Pap smear with positive HPV DNA test 01/2014,01/2017   2015 Normal cytology with positive high-risk HPV 18/45. Colposcopy negative with negative ECC.  2018 normal  cytology with positive high-risk HPV 18/45   Anemia    Apnea 05/17/2018   Autoimmune gastritis    Fatty liver    Gastritis    Heart murmur    Hypertension    Hyperthyroidism    Inappropriate sinus tachycardia 05/17/2018   Internal hemorrhoids    Osteoarthritis    Right shoulder pain 2001   chronic pain   Thyroid disease    Hyperthyroid. Was on PTU (Dr Hampton Abbot in [place]) - stopped in 2013, stable   Tubular adenoma of colon     Family History  Problem Relation Age of Onset   Kidney disease Mother        ESRD   Hypertension Mother    Lung cancer Father    Hypertension Sister    Kidney disease Sister    Hypertension Sister    Diabetes Brother    Hypertension Brother    Hypertension Brother    Diabetes Paternal Grandmother    Sleep apnea Son    Hypertension Son    Colon cancer Neg Hx    Stomach cancer Neg Hx    Esophageal cancer Neg Hx    Rectal cancer Neg Hx    Pancreatic cancer Neg Hx     Past Surgical History:  Procedure Laterality Date   BIOPSY  03/11/2022   Procedure: BIOPSY;  Surgeon: Rush Landmark, Telford Nab., MD;  Location: [facility] ENDOSCOPY;  Service: Gastroenterology;;   BREAST CYST ASPIRATION Left 2012   CESAREAN SECTION     x 4    COLONOSCOPY     ESOPHAGOGASTRODUODENOSCOPY (EGD) WITH PROPOFOL N/A 03/11/2022   Procedure: ESOPHAGOGASTRODUODENOSCOPY (EGD) WITH PROPOFOL;  Surgeon: Irving Copas., MD;  Location: [facility] ENDOSCOPY;  Service: Gastroenterology;  Laterality: N/A;   EUS N/A 03/11/2022   Procedure: UPPER ENDOSCOPIC  ULTRASOUND (EUS) RADIAL;  Surgeon: Irving Copas., MD;  Location: [facility];  Service: Gastroenterology;  Laterality: N/A;   HAND SURGERY Left    SHOULDER SURGERY Right 2002   SHOULDER SURGERY Left 2014   TUBAL LIGATION     UPPER GASTROINTESTINAL ENDOSCOPY     Social History   Occupational History   Occupation: RETIRED  Tobacco Use   Smoking status: Never    Passive exposure: Never   Smokeless tobacco: Never  Vaping Use   Vaping Use: Never used  Substance and Sexual Activity   Alcohol use: Yes    Comment: RARELY   Drug use: No   Sexual activity: Yes    Birth control/protection: Post-menopausal, Surgical    Comment: BTL-1st intercourse 16 yo-5 partners

## 2022-09-05 ENCOUNTER — Other Ambulatory Visit: Payer: Medicare Other

## 2022-09-07 ENCOUNTER — Ambulatory Visit (INDEPENDENT_AMBULATORY_CARE_PROVIDER_SITE_OTHER): Payer: Medicare Other | Admitting: Internal Medicine

## 2022-09-07 ENCOUNTER — Encounter: Payer: Self-pay | Admitting: Internal Medicine

## 2022-09-07 VITALS — BP 130/84 | HR 90 | Temp 98.8°F | Ht 64.0 in | Wt 164.0 lb

## 2022-09-07 DIAGNOSIS — F4321 Adjustment disorder with depressed mood: Secondary | ICD-10-CM

## 2022-09-07 DIAGNOSIS — I10 Essential (primary) hypertension: Secondary | ICD-10-CM | POA: Diagnosis not present

## 2022-09-07 DIAGNOSIS — E876 Hypokalemia: Secondary | ICD-10-CM | POA: Diagnosis not present

## 2022-09-07 DIAGNOSIS — Z634 Disappearance and death of family member: Secondary | ICD-10-CM | POA: Diagnosis not present

## 2022-09-07 DIAGNOSIS — M25511 Pain in right shoulder: Secondary | ICD-10-CM | POA: Diagnosis not present

## 2022-09-07 DIAGNOSIS — R7989 Other specified abnormal findings of blood chemistry: Secondary | ICD-10-CM

## 2022-09-07 DIAGNOSIS — S43431S Superior glenoid labrum lesion of right shoulder, sequela: Secondary | ICD-10-CM

## 2022-09-07 MED ORDER — OXYCODONE-ACETAMINOPHEN 5-325 MG PO TABS: 1.0000 | ORAL_TABLET | Freq: Three times a day (TID) | ORAL | 0 refills | Status: DC | PRN

## 2022-09-07 MED ORDER — DICLOFENAC SODIUM 1.5 % EX SOLN: CUTANEOUS | 5 refills | Status: DC

## 2022-09-07 MED ORDER — GABAPENTIN 100 MG PO CAPS
ORAL_CAPSULE | ORAL | 3 refills | Status: DC
Start: 2022-09-07 — End: 2023-03-10

## 2022-09-07 MED ORDER — POTASSIUM CHLORIDE ER 20 MEQ PO TBCR: 20.0000 meq | EXTENDED_RELEASE_TABLET | Freq: Every day | ORAL | 3 refills | Status: DC

## 2022-09-07 MED ORDER — OXYCODONE-ACETAMINOPHEN 5-325 MG PO TABS: 1.0000 | ORAL_TABLET | Freq: Four times a day (QID) | ORAL | 0 refills | Status: DC | PRN

## 2022-09-07 NOTE — Assessment & Plan Note (Signed)
Discussed.

## 2022-09-07 NOTE — Assessment & Plan Note (Signed)
Cont w/Amlodipine, Micardis HCT

## 2022-09-07 NOTE — Assessment & Plan Note (Signed)
On B12 

## 2022-09-07 NOTE — Assessment & Plan Note (Addendum)
R shoulder and neck pain (worse) - pt saw Ortho - R shoulder MRI is pending. Taking ASA 500 mg 3-4 a day for pain too. Asked to reduce ASA.   Gabapentin tid. Percocet to tid  Potential benefits of a long term opioids use as well as potential risks (i.e. addiction risk, apnea etc) and complications (i.e. Somnolence, constipation and others) were explained to the patient and were aknowledged.

## 2022-09-07 NOTE — Progress Notes (Signed)
Subjective:  Patient ID: Karina Newman, female    DOB: March 22, 1956  Age: 67 y.o. MRN: [id number]  CC: No chief complaint on file.   HPI Karina Newman presents for chronic pain, HTN, depression. C/o severe R shoulder and neck pain (worse) - pt saw Ortho - R shoulder MRI is pending  Outpatient Medications Prior to Visit  Medication Sig Dispense Refill   acetaminophen (TYLENOL) 500 MG tablet Take 1,000 mg by mouth every 6 (six) hours as needed for moderate pain.     amLODipine (NORVASC) 5 MG tablet TAKE 1 TABLET (5 MG TOTAL) BY MOUTH DAILY. 90 tablet 3   aspirin-acetaminophen-caffeine (EXCEDRIN MIGRAINE) 250-250-65 MG tablet Take 2 tablets by mouth every 6 (six) hours as needed for headache.     carvedilol (COREG) 12.5 MG tablet Take 1 tablet (12.5 mg total) by mouth 2 (two) times daily. 180 tablet 3   cefdinir (OMNICEF) 300 MG capsule Take 1 capsule (300 mg total) by mouth 2 (two) times daily. 20 capsule 0   Cyanocobalamin (VITAMIN B-12) 500 MCG SUBL 1 sl qd 100 tablet 5   cyclobenzaprine (FLEXERIL) 5 MG tablet TAKE 1 TABLET BY MOUTH THREE TIMES A DAY AS NEEDED FOR MUSCLE SPASMS 60 tablet 2   cycloSPORINE (RESTASIS) 0.05 % ophthalmic emulsion Place 1 drop into both eyes 2 (two) times daily.     dicyclomine (BENTYL) 10 MG capsule TAKE 1 CAPSULE (10 MG TOTAL) BY MOUTH EVERY 8 (EIGHT) HOURS AS NEEDED FOR SPASMS. 270 capsule 3   Difluprednate 0.05 % EMUL Apply 1 drop to eye 4 (four) times daily.     diphenhydrAMINE (BENADRYL) 25 MG tablet Take 25 mg by mouth every 6 (six) hours as needed for allergies.     doxazosin (CARDURA) 4 MG tablet TAKE 1 TABLET BY MOUTH EVERY DAY 90 tablet 1   DULoxetine (CYMBALTA) 20 MG capsule Take 1 capsule (20 mg total) by mouth daily. 90 capsule 5   fluticasone (FLONASE) 50 MCG/ACT nasal spray Place 1 spray into both nostrils as needed. (Patient taking differently: Place 2 sprays into both nostrils as needed for allergies.) 48 mL 3   LORazepam (ATIVAN) 0.5 MG tablet  Take 1 tablet (0.5 mg total) by mouth 2 (two) times daily as needed for anxiety or sleep. (Patient taking differently: Take 0.5 mg by mouth at bedtime.) 60 tablet 1   moxifloxacin (VIGAMOX) 0.5 % ophthalmic solution Place 1 drop into both eyes.     omeprazole (PRILOSEC) 40 MG capsule TAKE 1 CAPSULE BY MOUTH EVERY DAY IN THE MORNING AND AT BEDTIME 180 capsule 1   polyethylene glycol (MIRALAX) 17 g packet Take daily as directed. Increase or decrease as needed 14 each 0   prednisoLONE acetate (PRED FORTE) 1 % ophthalmic suspension Place 1 drop into both eyes 4 (four) times daily.     sucralfate (CARAFATE) 1 g tablet TAKE 1 TABLET BY MOUTH 3 TIMES A DAY BETWEEN MEALS 270 tablet 3   telmisartan-hydrochlorothiazide (MICARDIS HCT) 80-25 MG tablet Take 1 tablet by mouth daily. 90 tablet 3   traZODone (DESYREL) 50 MG tablet TAKE 1 TABLET BY MOUTH EVERYDAY AT BEDTIME 90 tablet 3   valACYclovir (VALTREX) 500 MG tablet Take one tablet po BID x 3 days prn outbreak 30 tablet 1   Vitamin D, Cholecalciferol, 1000 units CAPS Take 2,000 Units by mouth daily.     Diclofenac Sodium 1.5 % SOLN USE 15 DROPS THREE TIMES DAILY EXTERNALLY FOR PAIN 150 mL 5   gabapentin (  NEURONTIN) 100 MG capsule TAKE 1 CAPSULE BY MOUTH THREE TIMES A DAY AS NEEDED 90 capsule 3   oxyCODONE-acetaminophen (PERCOCET/ROXICET) 5-325 MG tablet Take 1 tablet by mouth every 8 (eight) hours as needed for severe pain. 90 tablet 0   oxyCODONE-acetaminophen (PERCOCET/ROXICET) 5-325 MG tablet Take 1 tablet by mouth every 6 (six) hours as needed for severe pain. 90 tablet 0   potassium chloride 20 MEQ TBCR Take 20 mEq by mouth daily. 90 tablet 3   No facility-administered medications prior to visit.    ROS: Review of Systems  Constitutional:  Positive for fatigue. Negative for activity change, appetite change, chills and unexpected weight change.  HENT:  Negative for congestion, mouth sores and sinus pressure.   Eyes:  Negative for visual  disturbance.  Respiratory:  Negative for cough and chest tightness.   Gastrointestinal:  Negative for abdominal pain and nausea.  Genitourinary:  Negative for difficulty urinating, frequency and vaginal pain.  Musculoskeletal:  Positive for arthralgias, back pain, gait problem, neck pain and neck stiffness.  Skin:  Negative for pallor, rash and wound.  Neurological:  Negative for dizziness, tremors, weakness, numbness and headaches.  Hematological:  Does not bruise/bleed easily.  Psychiatric/Behavioral:  Positive for sleep disturbance. Negative for confusion and suicidal ideas. The patient is nervous/anxious.     Objective:  BP 130/84 (BP Location: Right Arm, Patient Position: Sitting, Cuff Size: Normal)   Pulse 90   Temp 98.8 F (37.1 C) (Oral)   Ht '5\' 4"'$  (1.626 m)   Wt 164 lb (74.4 kg)   SpO2 97%   BMI 28.15 kg/m   BP Readings from Last 3 Encounters:  09/07/22 130/84  06/07/22 128/80  06/07/22 128/80    Wt Readings from Last 3 Encounters:  09/07/22 164 lb (74.4 kg)  06/07/22 166 lb 3.6 oz (75.4 kg)  06/07/22 166 lb 3.2 oz (75.4 kg)    Physical Exam Constitutional:      General: She is not in acute distress.    Appearance: She is well-developed. She is obese.  HENT:     Head: Normocephalic.     Right Ear: External ear normal.     Left Ear: External ear normal.     Nose: Nose normal.  Eyes:     General:        Right eye: No discharge.        Left eye: No discharge.     Conjunctiva/sclera: Conjunctivae normal.     Pupils: Pupils are equal, round, and reactive to light.  Neck:     Thyroid: No thyromegaly.     Vascular: No JVD.     Trachea: No tracheal deviation.  Cardiovascular:     Rate and Rhythm: Normal rate and regular rhythm.     Heart sounds: Normal heart sounds.  Pulmonary:     Effort: No respiratory distress.     Breath sounds: No stridor. No wheezing.  Abdominal:     General: Bowel sounds are normal. There is no distension.     Palpations: Abdomen  is soft. There is no mass.     Tenderness: There is no abdominal tenderness. There is no guarding or rebound.  Musculoskeletal:        General: Tenderness present.     Cervical back: Normal range of motion and neck supple. No rigidity.     Right lower leg: No edema.     Left lower leg: No edema.  Lymphadenopathy:     Cervical: No cervical adenopathy.  Skin:    Findings: No erythema or rash.  Neurological:     Mental Status: She is oriented to person, place, and time.     Cranial Nerves: No cranial nerve deficit.     Motor: No abnormal muscle tone.     Coordination: Coordination normal.     Gait: Gait abnormal.     Deep Tendon Reflexes: Reflexes normal.  Psychiatric:        Behavior: Behavior normal.        Thought Content: Thought content normal.        Judgment: Judgment normal.    Antalgic gait R shoulder, R neck w/pain   Lab Results  Component Value Date   WBC 5.2 03/01/2022   HGB 13.3 03/01/2022   HCT 39.9 03/01/2022   PLT 312.0 03/01/2022   GLUCOSE 90 04/28/2022   CHOL 161 12/04/2020   TRIG 101.0 12/04/2020   HDL 40.20 12/04/2020   LDLCALC 101 (H) 12/04/2020   ALT 39 (H) 03/01/2022   AST 28 03/01/2022   NA 141 04/28/2022   K 4.5 04/28/2022   CL 101 04/28/2022   CREATININE 0.88 04/28/2022   BUN 13 04/28/2022   CO2 22 04/28/2022   TSH 0.65 03/01/2022    No results found.  Assessment & Plan:   Problem List Items Addressed This Visit       Cardiovascular and Mediastinum   HTN (hypertension), benign    Cont w/Amlodipine, Micardis HCT        Musculoskeletal and Integument   Labral tear of shoulder - Primary    R shoulder and neck pain (worse) - pt saw Ortho - R shoulder MRI is pending. Taking ASA 500 mg 3-4 a day for pain too. Asked to reduce ASA.   Gabapentin tid. Percocet to tid  Potential benefits of a long term opioids use as well as potential risks (i.e. addiction risk, apnea etc) and complications (i.e. Somnolence, constipation and others) were  explained to the patient and were aknowledged.        Other   Grief at loss of child    Discussed       Low vitamin B12 level    On B12      Pain in joint, shoulder region    R shoulder and neck pain (worse) - pt saw Ortho - R shoulder MRI is pending. Taking ASA 500 mg 3-4 a day for pain too. Asked to reduce ASA.   Gabapentin tid. Percocet to tid  Potential benefits of a long term opioids use as well as potential risks (i.e. addiction risk, apnea etc) and complications (i.e. Somnolence, constipation and others) were explained to the patient and were aknowledged.      Other Visit Diagnoses     Potassium (K) deficiency       Relevant Medications   Potassium Chloride ER 20 MEQ TBCR         Meds ordered this encounter  Medications   Diclofenac Sodium 1.5 % SOLN    Sig: USE 15 DROPS THREE TIMES DAILY EXTERNALLY FOR PAIN    Dispense:  150 mL    Refill:  5   gabapentin (NEURONTIN) 100 MG capsule    Sig: TAKE 1 CAPSULE BY MOUTH THREE TIMES A DAY AS NEEDED    Dispense:  90 capsule    Refill:  3   oxyCODONE-acetaminophen (PERCOCET/ROXICET) 5-325 MG tablet    Sig: Take 1 tablet by mouth every 6 (six) hours as needed for severe pain.  Dispense:  90 tablet    Refill:  0    Please fill on or after 10/08/22   Potassium Chloride ER 20 MEQ TBCR    Sig: Take 1 tablet (20 mEq total) by mouth daily.    Dispense:  90 tablet    Refill:  3   oxyCODONE-acetaminophen (PERCOCET/ROXICET) 5-325 MG tablet    Sig: Take 1 tablet by mouth every 8 (eight) hours as needed for severe pain.    Dispense:  90 tablet    Refill:  0    Please fill on or after 09/08/22      Follow-up: Return in about 3 months (around 12/07/2022) for a follow-up visit.  Walker Kehr, MD

## 2022-09-07 NOTE — Assessment & Plan Note (Signed)
R shoulder and neck pain (worse) - pt saw Ortho - R shoulder MRI is pending. Taking ASA 500 mg 3-4 a day for pain too. Asked to reduce ASA.   Gabapentin tid. Percocet to tid  Potential benefits of a long term opioids use as well as potential risks (i.e. addiction risk, apnea etc) and complications (i.e. Somnolence, constipation and others) were explained to the patient and were aknowledged.

## 2022-09-08 ENCOUNTER — Encounter: Payer: Self-pay | Admitting: Gastroenterology

## 2022-09-11 ENCOUNTER — Other Ambulatory Visit: Payer: Self-pay | Admitting: Cardiovascular Disease

## 2022-09-13 ENCOUNTER — Other Ambulatory Visit: Payer: Self-pay | Admitting: Internal Medicine

## 2022-09-13 NOTE — Telephone Encounter (Signed)
Rx(s) sent to pharmacy electronically.  

## 2022-09-16 ENCOUNTER — Other Ambulatory Visit: Payer: Self-pay | Admitting: Internal Medicine

## 2022-09-29 ENCOUNTER — Other Ambulatory Visit: Payer: Self-pay | Admitting: Internal Medicine

## 2022-10-13 DIAGNOSIS — K219 Gastro-esophageal reflux disease without esophagitis: Secondary | ICD-10-CM | POA: Diagnosis not present

## 2022-10-13 DIAGNOSIS — R0982 Postnasal drip: Secondary | ICD-10-CM | POA: Diagnosis not present

## 2022-10-15 DIAGNOSIS — H43813 Vitreous degeneration, bilateral: Secondary | ICD-10-CM | POA: Diagnosis not present

## 2022-10-15 DIAGNOSIS — H35353 Cystoid macular degeneration, bilateral: Secondary | ICD-10-CM | POA: Diagnosis not present

## 2022-10-15 DIAGNOSIS — Z961 Presence of intraocular lens: Secondary | ICD-10-CM | POA: Diagnosis not present

## 2022-10-15 DIAGNOSIS — H16223 Keratoconjunctivitis sicca, not specified as Sjogren's, bilateral: Secondary | ICD-10-CM | POA: Diagnosis not present

## 2022-10-15 DIAGNOSIS — H35372 Puckering of macula, left eye: Secondary | ICD-10-CM | POA: Diagnosis not present

## 2022-11-02 DIAGNOSIS — J329 Chronic sinusitis, unspecified: Secondary | ICD-10-CM | POA: Diagnosis not present

## 2022-11-02 DIAGNOSIS — R0981 Nasal congestion: Secondary | ICD-10-CM | POA: Insufficient documentation

## 2022-11-02 DIAGNOSIS — R519 Headache, unspecified: Secondary | ICD-10-CM | POA: Diagnosis not present

## 2022-11-02 DIAGNOSIS — J3489 Other specified disorders of nose and nasal sinuses: Secondary | ICD-10-CM | POA: Diagnosis not present

## 2022-11-02 NOTE — Progress Notes (Signed)
Office Visit Note  Patient: Karina Newman             Date of Birth: 05-10-1956           MRN: [id number]             PCP: Cassandria Anger, MD Referring: Cassandria Anger, MD Visit Date: 11/15/2022 Occupation: @GUAROCC @  Subjective:  Pain in multiple joints  History of Present Illness: Karina Newman is a 67 y.o. female with history of osteoarthritis, degenerative disc disease.  She states she continues to have pain and discomfort in her right shoulder joint since the injury in 2001.  She was seeing Dr. Durward Fortes in the past.  He needs to have pain and discomfort in her bilateral knee joints.  She had viscosupplement injections in 2002 which were helpful.  She states the pain in her knee joints is worse.  She has difficulty kneeling and stooping.  She also has discomfort while she walks.  She continues to have pain and discomfort in her bilateral hands.  She has intermittent discomfort in her trochanteric bursa.  She she has intermittent discomfort in her plantar fascia.  She sees Dr. Cannon Kettle for it.  She continues to have discomfort in her cervical region. She had right eye cataract surgery in October and left eye in November 2023.  She continues to have inflammation in her eyes.  He is under care of Dr. Edsel Petrin.  She has developed floaters in her left eye.  She is using Restasis eyedrops.  Activities of Daily Living:  Patient reports morning stiffness for 24 hours.   Patient Reports nocturnal pain.  Difficulty dressing/grooming: Reports Difficulty climbing stairs: Reports Difficulty getting out of chair: Denies Difficulty using hands for taps, buttons, cutlery, and/or writing: Reports  Review of Systems  Constitutional:  Positive for fatigue.  HENT:  Negative for mouth sores and mouth dryness.   Eyes:  Positive for dryness.  Respiratory:  Negative for shortness of breath.   Cardiovascular:  Negative for chest pain and palpitations.  Gastrointestinal:  Negative for blood in  stool, constipation and diarrhea.  Endocrine: Negative for increased urination.  Genitourinary:  Negative for involuntary urination.  Musculoskeletal:  Positive for joint pain, joint pain, joint swelling, myalgias, morning stiffness, muscle tenderness and myalgias. Negative for gait problem and muscle weakness.  Skin:  Negative for color change, rash, hair loss and sensitivity to sunlight.  Allergic/Immunologic: Negative for susceptible to infections.  Neurological:  Positive for headaches. Negative for dizziness.  Hematological:  Negative for swollen glands.  Psychiatric/Behavioral:  Positive for depressed mood and sleep disturbance. The patient is nervous/anxious.     PMFS History:  Patient Active Problem List   Diagnosis Date Noted   Gastritis 03/01/2022   Toe pain, chronic, right 09/17/2020   Grief at loss of child 07/24/2020   Dysphagia 01/02/2020   Globus sensation 01/02/2020   Thoracic back pain 12/19/2019   Sore throat 10/24/2019   Goiter 10/24/2019   Cough 10/24/2019   Neoplasm of uncertain behavior of skin 09/27/2019   Pain of right breast 06/03/2019   Fatty liver 01/23/2019   Hand pain 09/28/2018   Knee pain 09/28/2018   Inappropriate sinus tachycardia 05/17/2018   Apnea 05/17/2018   Upper respiratory infection 09/23/2017   Fatigue 09/12/2017   Well adult exam 01/19/2017   Abdominal pain 01/19/2017   Headache 10/27/2016   Pain in right shoulder 09/16/2016   Left hip pain 09/16/2016   Low vitamin B12 level 03/16/2016  Piriformis syndrome of right side 02/04/2016   IT band syndrome 02/04/2016   RUQ abdominal pain 02/04/2016   Low back pain radiating to right lower extremity 09/19/2015   Allergic rhinitis 09/19/2015   Atypical chest pain 04/07/2015   GERD (gastroesophageal reflux disease) 04/07/2015   Axillary adenitis 03/23/2015   Migraine headache 03/23/2015   Hives 01/29/2015   Edema 01/29/2015   Cervical disc disorder with radiculopathy of cervical region  12/11/2014   Insomnia 07/31/2014   Degenerative cervical disc 05/08/2014   Pain in joint, shoulder region 03/05/2014   Patellofemoral arthralgia of both knees 03/01/2014   Bilateral shoulder pain 10/18/2013   Sinusitis, bacterial 08/20/2013   URI, acute 08/14/2013   Hemoptysis 08/14/2013   Otitis media of left ear 08/14/2013   Labral tear of shoulder 07/03/2013   Hyperthyroidism 07/03/2013   HTN (hypertension), benign 07/03/2013   LVH (left ventricular hypertrophy) due to hypertensive disease 07/03/2013   Vitamin D deficiency 07/03/2013    Past Medical History:  Diagnosis Date   Abnormal cervical Pap smear with positive HPV DNA test 01/2014,01/2017   2015 Normal cytology with positive high-risk HPV 18/45. Colposcopy negative with negative ECC.  2018 normal cytology with positive high-risk HPV 18/45   Anemia    Apnea 05/17/2018   Autoimmune gastritis    Fatty liver    Gastritis    Heart murmur    Hypertension    Hyperthyroidism    Inappropriate sinus tachycardia 05/17/2018   Internal hemorrhoids    Osteoarthritis    Right shoulder pain 2001   chronic pain   Thyroid disease    Hyperthyroid. Was on PTU (Dr Hampton Abbot in [place]) - stopped in 2013, stable   Tubular adenoma of colon     Family History  Problem Relation Age of Onset   Kidney disease Mother        ESRD   Hypertension Mother    Lung cancer Father    Hypertension Sister    Kidney disease Sister    Hypertension Sister    Diabetes Brother    Hypertension Brother    Hypertension Brother    Diabetes Paternal Grandmother    Sleep apnea Son    Hypertension Son    Colon cancer Neg Hx    Stomach cancer Neg Hx    Esophageal cancer Neg Hx    Rectal cancer Neg Hx    Pancreatic cancer Neg Hx    Past Surgical History:  Procedure Laterality Date   BIOPSY  03/11/2022   Procedure: BIOPSY;  Surgeon: Rush Landmark, Telford Nab., MD;  Location: [facility];  Service: Gastroenterology;;   BREAST CYST ASPIRATION Left 2012   CATARACT  EXTRACTION Right 06/15/2022   CATARACT EXTRACTION Left 06/28/2022   CESAREAN SECTION     x 4    COLONOSCOPY     ESOPHAGOGASTRODUODENOSCOPY (EGD) WITH PROPOFOL N/A 03/11/2022   Procedure: ESOPHAGOGASTRODUODENOSCOPY (EGD) WITH PROPOFOL;  Surgeon: Irving Copas., MD;  Location: [facility];  Service: Gastroenterology;  Laterality: N/A;   EUS N/A 03/11/2022   Procedure: UPPER ENDOSCOPIC ULTRASOUND (EUS) RADIAL;  Surgeon: Irving Copas., MD;  Location: [facility];  Service: Gastroenterology;  Laterality: N/A;   HAND SURGERY Left    SHOULDER SURGERY Right 2002   SHOULDER SURGERY Left 2014   TUBAL LIGATION     UPPER GASTROINTESTINAL ENDOSCOPY     Social History   Social History Narrative   Not on file   Immunization History  Administered Date(s) Administered   Tdap 10/17/2021  Zoster Recombinat (Shingrix) 03/25/2017, 06/27/2017     Objective: Vital Signs: BP (!) 145/83 (BP Location: Left Arm, Patient Position: Sitting, Cuff Size: Normal)   Pulse 74   Resp 14   Ht 5\' 4"  (1.626 m)   Wt 176 lb (79.8 kg)   BMI 30.21 kg/m    Physical Exam Vitals and nursing note reviewed.  Constitutional:      Appearance: She is well-developed.  HENT:     Head: Normocephalic and atraumatic.  Eyes:     Conjunctiva/sclera: Conjunctivae normal.  Cardiovascular:     Rate and Rhythm: Normal rate and regular rhythm.     Heart sounds: Normal heart sounds.  Pulmonary:     Effort: Pulmonary effort is normal.     Breath sounds: Normal breath sounds.  Abdominal:     General: Bowel sounds are normal.     Palpations: Abdomen is soft.  Musculoskeletal:     Cervical back: Normal range of motion.  Lymphadenopathy:     Cervical: No cervical adenopathy.  Skin:    General: Skin is warm and dry.     Capillary Refill: Capillary refill takes less than 2 seconds.  Neurological:     Mental Status: She is alert and oriented to person, place, and time.  Psychiatric:        Behavior:  Behavior normal.      Musculoskeletal Exam: Cervical spine was in good range of motion.  She had right shoulder joint abduction limited to about 120 degrees and limited internal rotation.  She had discomfort range of motion of her right shoulder joint.  Left shoulder joint was in full range of motion.  Elbows, wrist joints, MCPs PIPs and DIPs been good range of motion with no synovitis.  She had bilateral PIP and DIP thickening.  Hip joints and knee joints were in good range of motion.  She had discomfort range of motion of bilateral knee joints without any warmth swelling or effusion.  She had to have no tenderness over ankles or MTPs.  CDAI Exam: CDAI Score: -- Patient Global: --; Provider Global: -- Swollen: --; Tender: -- Joint Exam 11/15/2022   No joint exam has been documented for this visit   There is currently no information documented on the homunculus. Go to the Rheumatology activity and complete the homunculus joint exam.  Investigation: No additional findings.  Imaging: No results found.  Recent Labs: Lab Results  Component Value Date   WBC 5.2 03/01/2022   HGB 13.3 03/01/2022   PLT 312.0 03/01/2022   NA 141 04/28/2022   K 4.5 04/28/2022   CL 101 04/28/2022   CO2 22 04/28/2022   GLUCOSE 90 04/28/2022   BUN 13 04/28/2022   CREATININE 0.88 04/28/2022   BILITOT 0.5 03/01/2022   ALKPHOS 98 03/01/2022   AST 28 03/01/2022   ALT 39 (H) 03/01/2022   PROT 8.0 03/01/2022   ALBUMIN 4.5 03/01/2022   CALCIUM 9.9 04/28/2022   GFRAA 78 06/20/2018   October 21, 2021 ANA 1: 40NH, anti-CCP 21, RF negative, 14 3 3  ETA negative, ESR 19, C-reactive protein 3.1  Speciality Comments: No specialty comments available.  Procedures:  No procedures performed Allergies: Penicillins, Tramadol, Iodine, Lexapro [escitalopram], Lyrica [pregabalin], Nsaids, and Voltaren [diclofenac]   Assessment / Plan:     Visit Diagnoses: Chronic right shoulder pain-patient states she continues to have  discomfort in her right shoulder joint since the injury was 2001.  She was under care of Dr. Durward Fortes in the past.  She states she did not have much response to the cortisone injection.  Dr. Durward Fortes discussed surgery with the patient in the past.  According the patient she was not interested in the surgery.  Primary osteoarthritis of both knees-she has moderate osteoarthritis in her knee joints.  She had viscosupplement injections in the past which were effective.  Her last viscosupplement injections were in July 13, 2021.  She states viscosupplement injections were helpful.  Recently she has been having increased pain and discomfort in her bilateral knee joints.  No warmth swelling or effusion was noted.  I will obtain x-rays of her bilateral knee joints.  X-rays obtained of bilateral knee joints in the office showed bilateral moderate osteoarthritis and moderate chondromalacia patella.  No radiographic progression was noted.  Patient is interested in scheduling repeat viscosupplement injections. This patient is diagnosed with osteoarthritis of the knee(s).    Radiographs show evidence of joint space narrowing, osteophytes, subchondral sclerosis and/or subchondral cysts.  This patient has knee pain which interferes with functional and activities of daily living.    This patient has experienced inadequate response, adverse effects and/or intolerance with conservative treatments such as acetaminophen, NSAIDS, topical creams, physical therapy or regular exercise, knee bracing and/or weight loss.   This patient has experienced inadequate response or has a contraindication to intra articular steroid injections for at least 3 months.   This patient is not scheduled to have a total knee replacement within 6 months of starting treatment with viscosupplementation.   Trochanteric bursitis of both hips-she has intermittent discomfort in the trochanteric region.  She did not have much discomfort on  palpation today.  Plantar fasciitis, bilateral-she has intermittent Planter fasciitis.  She sees Dr. Nicole Kindred.  Use of arch support was advised.  DDD (degenerative disc disease), cervical-she had good range of motion without any discomfort on the examination today.  Vitreous floaters of right eye-patient states that she had bilateral cataract surgery in October and November in her right and left eye respectively.  She has been having chronic inflammation.  She states she is not using any eyedrops currently except for Restasis.  She has been followed by her ophthalmologist.  Vitamin D deficiency  HTN (hypertension), benign-blood pressure was elevated today at 157/90.  Hypertensive left ventricular hypertrophy, without heart failure  Fatty liver  Tubular adenoma of colon  Other insomnia  History of gastroesophageal reflux (GERD)  Hyperthyroidism  Orders: Orders Placed This Encounter  Procedures   XR KNEE 3 VIEW RIGHT   XR KNEE 3 VIEW LEFT   No orders of the defined types were placed in this encounter.   Follow-Up Instructions: Return in about 6 months (around 05/17/2023) for Osteoarthritis.   Bo Merino, MD  Note - This record has been created using Editor, commissioning.  Chart creation errors have been sought, but may not always  have been located. Such creation errors do not reflect on  the standard of medical care.

## 2022-11-15 ENCOUNTER — Telehealth: Payer: Self-pay | Admitting: Rheumatology

## 2022-11-15 ENCOUNTER — Encounter: Payer: Self-pay | Admitting: Rheumatology

## 2022-11-15 ENCOUNTER — Ambulatory Visit: Payer: Medicare Other

## 2022-11-15 ENCOUNTER — Ambulatory Visit: Payer: Medicare Other | Attending: Rheumatology | Admitting: Rheumatology

## 2022-11-15 ENCOUNTER — Ambulatory Visit (INDEPENDENT_AMBULATORY_CARE_PROVIDER_SITE_OTHER): Payer: Medicare Other

## 2022-11-15 ENCOUNTER — Telehealth: Payer: Self-pay | Admitting: *Deleted

## 2022-11-15 VITALS — BP 145/83 | HR 74 | Resp 14 | Ht 64.0 in | Wt 176.0 lb

## 2022-11-15 DIAGNOSIS — E0590 Thyrotoxicosis, unspecified without thyrotoxic crisis or storm: Secondary | ICD-10-CM

## 2022-11-15 DIAGNOSIS — M25562 Pain in left knee: Secondary | ICD-10-CM | POA: Diagnosis not present

## 2022-11-15 DIAGNOSIS — M25561 Pain in right knee: Secondary | ICD-10-CM

## 2022-11-15 DIAGNOSIS — I10 Essential (primary) hypertension: Secondary | ICD-10-CM | POA: Diagnosis not present

## 2022-11-15 DIAGNOSIS — M5030 Other cervical disc degeneration, unspecified cervical region: Secondary | ICD-10-CM

## 2022-11-15 DIAGNOSIS — M7061 Trochanteric bursitis, right hip: Secondary | ICD-10-CM | POA: Diagnosis not present

## 2022-11-15 DIAGNOSIS — M25511 Pain in right shoulder: Secondary | ICD-10-CM | POA: Diagnosis not present

## 2022-11-15 DIAGNOSIS — K760 Fatty (change of) liver, not elsewhere classified: Secondary | ICD-10-CM | POA: Diagnosis not present

## 2022-11-15 DIAGNOSIS — G8929 Other chronic pain: Secondary | ICD-10-CM | POA: Diagnosis not present

## 2022-11-15 DIAGNOSIS — Z8719 Personal history of other diseases of the digestive system: Secondary | ICD-10-CM

## 2022-11-15 DIAGNOSIS — E559 Vitamin D deficiency, unspecified: Secondary | ICD-10-CM

## 2022-11-15 DIAGNOSIS — I119 Hypertensive heart disease without heart failure: Secondary | ICD-10-CM

## 2022-11-15 DIAGNOSIS — M170 Bilateral primary osteoarthritis of knee: Secondary | ICD-10-CM

## 2022-11-15 DIAGNOSIS — M722 Plantar fascial fibromatosis: Secondary | ICD-10-CM | POA: Diagnosis not present

## 2022-11-15 DIAGNOSIS — D126 Benign neoplasm of colon, unspecified: Secondary | ICD-10-CM | POA: Diagnosis not present

## 2022-11-15 DIAGNOSIS — G4709 Other insomnia: Secondary | ICD-10-CM | POA: Diagnosis not present

## 2022-11-15 DIAGNOSIS — M7062 Trochanteric bursitis, left hip: Secondary | ICD-10-CM

## 2022-11-15 DIAGNOSIS — H43391 Other vitreous opacities, right eye: Secondary | ICD-10-CM

## 2022-11-15 NOTE — Telephone Encounter (Signed)
Her kidney functions and liver functions normal at the last visit.  It is okay to take ibuprofen on a as needed basis.

## 2022-11-15 NOTE — Telephone Encounter (Signed)
Patient while checking out, states she wanted to know if Ibuprofen would help her joint pain. Patient states she forgot to ask. Patient would like a call.

## 2022-11-15 NOTE — Telephone Encounter (Signed)
VOB submitted for Euflexxa, bilateral knees BV pending 

## 2022-11-15 NOTE — Telephone Encounter (Signed)
Please order visco bil knees for Hazel Sams, PA. Thank you.

## 2022-11-15 NOTE — Telephone Encounter (Signed)
Patient advised her kidney functions and liver functions normal at the last visit.  It is okay to take ibuprofen on a as needed basis.

## 2022-11-16 DIAGNOSIS — H35353 Cystoid macular degeneration, bilateral: Secondary | ICD-10-CM | POA: Diagnosis not present

## 2022-11-16 DIAGNOSIS — H43813 Vitreous degeneration, bilateral: Secondary | ICD-10-CM | POA: Diagnosis not present

## 2022-11-16 DIAGNOSIS — H35373 Puckering of macula, bilateral: Secondary | ICD-10-CM | POA: Diagnosis not present

## 2022-11-16 NOTE — Telephone Encounter (Signed)
Please call to schedule visco injections.  Approved for Synvisc, Bilateral knee(s). [facility] Patient is responsible for 20% coinsurance $20 co-pay Deductible does not apply No pre-certifications required confirmed with [facility], Reference [id number]

## 2022-11-24 ENCOUNTER — Other Ambulatory Visit: Payer: Self-pay | Admitting: Internal Medicine

## 2022-12-01 DIAGNOSIS — H35373 Puckering of macula, bilateral: Secondary | ICD-10-CM | POA: Diagnosis not present

## 2022-12-01 DIAGNOSIS — H43813 Vitreous degeneration, bilateral: Secondary | ICD-10-CM | POA: Diagnosis not present

## 2022-12-01 DIAGNOSIS — H43392 Other vitreous opacities, left eye: Secondary | ICD-10-CM | POA: Diagnosis not present

## 2022-12-01 DIAGNOSIS — H59031 Cystoid macular edema following cataract surgery, right eye: Secondary | ICD-10-CM | POA: Diagnosis not present

## 2022-12-07 ENCOUNTER — Encounter: Payer: Self-pay | Admitting: Internal Medicine

## 2022-12-07 ENCOUNTER — Ambulatory Visit (INDEPENDENT_AMBULATORY_CARE_PROVIDER_SITE_OTHER): Payer: Medicare Other | Admitting: Internal Medicine

## 2022-12-07 VITALS — BP 118/78 | HR 98 | Temp 98.0°F | Ht 64.0 in | Wt 175.0 lb

## 2022-12-07 DIAGNOSIS — J301 Allergic rhinitis due to pollen: Secondary | ICD-10-CM | POA: Diagnosis not present

## 2022-12-07 DIAGNOSIS — I10 Essential (primary) hypertension: Secondary | ICD-10-CM

## 2022-12-07 DIAGNOSIS — E559 Vitamin D deficiency, unspecified: Secondary | ICD-10-CM | POA: Diagnosis not present

## 2022-12-07 DIAGNOSIS — G43009 Migraine without aura, not intractable, without status migrainosus: Secondary | ICD-10-CM | POA: Diagnosis not present

## 2022-12-07 DIAGNOSIS — S43431S Superior glenoid labrum lesion of right shoulder, sequela: Secondary | ICD-10-CM | POA: Diagnosis not present

## 2022-12-07 MED ORDER — DICLOFENAC SODIUM 1.5 % EX SOLN: CUTANEOUS | 5 refills | Status: DC

## 2022-12-07 MED ORDER — OXYCODONE-ACETAMINOPHEN 5-325 MG PO TABS: 1.0000 | ORAL_TABLET | Freq: Three times a day (TID) | ORAL | 0 refills | Status: DC | PRN

## 2022-12-07 MED ORDER — SUMATRIPTAN SUCCINATE 100 MG PO TABS: ORAL_TABLET | ORAL | 5 refills | Status: DC

## 2022-12-07 NOTE — Assessment & Plan Note (Signed)
R shoulder and neck pain  - pt saw Ortho Taking ASA 500 mg 3-4 a day for pain too. Asked to reduce ASA.   Gabapentin tid. Percocet tid prn  Potential benefits of a long term opioids use as well as potential risks (i.e. addiction risk, apnea etc) and complications (i.e. Somnolence, constipation and others) were explained to the patient and were aknowledged.

## 2022-12-07 NOTE — Assessment & Plan Note (Signed)
Cont w/Amlodipine, Micardis HCT °

## 2022-12-07 NOTE — Progress Notes (Signed)
Subjective:  Patient ID: Karina Newman, female    DOB: 01-Jul-1956  Age: 67 y.o. MRN: [id number]  CC: Follow-up (Pt states that she congested. Pt has been congested for the past 5 days now.)   HPI Karina Newman presents for chronic pain, grief C/o allergies F/u on HTN, depression   Outpatient Medications Prior to Visit  Medication Sig Dispense Refill   acetaminophen (TYLENOL) 500 MG tablet Take 1,000 mg by mouth every 6 (six) hours as needed for moderate pain.     amLODipine (NORVASC) 5 MG tablet TAKE 1 TABLET (5 MG TOTAL) BY MOUTH DAILY. 90 tablet 2   carvedilol (COREG) 12.5 MG tablet Take 1 tablet (12.5 mg total) by mouth 2 (two) times daily. 180 tablet 3   Cholecalciferol 50 MCG (2000 UT) CAPS Take by mouth.     Cyanocobalamin (VITAMIN B-12) 500 MCG SUBL 1 sl qd 100 tablet 5   cyclobenzaprine (FLEXERIL) 5 MG tablet TAKE 1 TABLET BY MOUTH THREE TIMES A DAY AS NEEDED FOR MUSCLE SPASMS 60 tablet 2   cycloSPORINE (RESTASIS) 0.05 % ophthalmic emulsion Place 1 drop into both eyes 2 (two) times daily.     dicyclomine (BENTYL) 10 MG capsule TAKE 1 CAPSULE (10 MG TOTAL) BY MOUTH EVERY 8 (EIGHT) HOURS AS NEEDED FOR SPASMS. 270 capsule 3   Difluprednate 0.05 % EMUL Apply 1 drop to eye 4 (four) times daily.     diphenhydrAMINE (BENADRYL) 25 MG tablet Take 25 mg by mouth every 6 (six) hours as needed for allergies.     doxazosin (CARDURA) 4 MG tablet TAKE 1 TABLET BY MOUTH EVERY DAY 90 tablet 1   DULoxetine (CYMBALTA) 20 MG capsule Take 1 capsule (20 mg total) by mouth daily. 90 capsule 5   gabapentin (NEURONTIN) 100 MG capsule TAKE 1 CAPSULE BY MOUTH THREE TIMES A DAY AS NEEDED 90 capsule 3   ipratropium (ATROVENT) 0.06 % nasal spray 2 sprays in both nostrils 3 times daily     LORazepam (ATIVAN) 0.5 MG tablet TAKE 1 TABLET (0.5 MG TOTAL) BY MOUTH 2 (TWO) TIMES DAILY AS NEEDED FOR ANXIETY OR SLEEP. 60 tablet 2   omeprazole (PRILOSEC OTC) 20 MG tablet Take by mouth.     omeprazole (PRILOSEC) 40  MG capsule TAKE 1 CAPSULE BY MOUTH EVERY DAY IN THE MORNING AND AT BEDTIME 180 capsule 3   Potassium Chloride ER 20 MEQ TBCR Take 1 tablet (20 mEq total) by mouth daily. 90 tablet 3   prednisoLONE acetate (PRED FORTE) 1 % ophthalmic suspension Place 1 drop into the right eye daily.     PROLENSA 0.07 % SOLN Apply to eye.     pseudoephedrine (SUDAFED) 120 MG 12 hr tablet Take by mouth.     sucralfate (CARAFATE) 1 g tablet TAKE 1 TABLET BY MOUTH 3 TIMES A DAY BETWEEN MEALS 270 tablet 3   telmisartan-hydrochlorothiazide (MICARDIS HCT) 80-25 MG tablet Take 1 tablet by mouth daily. 90 tablet 3   traZODone (DESYREL) 50 MG tablet TAKE 1 TABLET BY MOUTH EVERYDAY AT BEDTIME 90 tablet 3   Vitamin D, Cholecalciferol, 1000 units CAPS Take 2,000 Units by mouth daily.     Diclofenac Sodium 1.5 % SOLN USE 15 DROPS THREE TIMES DAILY EXTERNALLY FOR PAIN 150 mL 5   oxyCODONE-acetaminophen (PERCOCET/ROXICET) 5-325 MG tablet Take 1 tablet by mouth every 6 (six) hours as needed for severe pain. 90 tablet 0   oxyCODONE-acetaminophen (PERCOCET/ROXICET) 5-325 MG tablet Take 1 tablet by mouth every 8 (eight)  hours as needed for severe pain. 90 tablet 0   No facility-administered medications prior to visit.    ROS: Review of Systems  Constitutional:  Positive for fatigue. Negative for activity change, appetite change, chills and unexpected weight change.  HENT:  Positive for congestion. Negative for mouth sores and sinus pressure.   Eyes:  Negative for visual disturbance.  Respiratory:  Positive for cough. Negative for chest tightness.   Gastrointestinal:  Negative for abdominal pain and nausea.  Genitourinary:  Negative for difficulty urinating, frequency and vaginal pain.  Musculoskeletal:  Positive for arthralgias. Negative for back pain and gait problem.  Skin:  Negative for pallor and rash.  Neurological:  Negative for dizziness, tremors, weakness, numbness and headaches.  Psychiatric/Behavioral:  Positive for  dysphoric mood and sleep disturbance. Negative for confusion and decreased concentration.     Objective:  BP 118/78 (BP Location: Left Arm, Patient Position: Sitting, Cuff Size: Normal)   Pulse 98   Temp 98 F (36.7 C) (Oral)   Ht  (1.626 m)   Wt 175 lb (79.4 kg)   SpO2 97%   BMI 30.04 kg/m   BP Readings from Last 3 Encounters:  12/07/22 118/78  11/15/22 (!) 145/83  09/07/22 130/84    Wt Readings from Last 3 Encounters:  12/07/22 175 lb (79.4 kg)  11/15/22 176 lb (79.8 kg)  09/07/22 164 lb (74.4 kg)    Physical Exam Constitutional:      General: She is not in acute distress.    Appearance: She is well-developed.  HENT:     Head: Normocephalic.     Right Ear: External ear normal.     Left Ear: External ear normal.     Nose: Nose normal.  Eyes:     General:        Right eye: No discharge.        Left eye: No discharge.     Conjunctiva/sclera: Conjunctivae normal.     Pupils: Pupils are equal, round, and reactive to light.  Neck:     Thyroid: No thyromegaly.     Vascular: No JVD.     Trachea: No tracheal deviation.  Cardiovascular:     Rate and Rhythm: Normal rate and regular rhythm.     Heart sounds: Normal heart sounds.  Pulmonary:     Effort: No respiratory distress.     Breath sounds: No stridor. No wheezing.  Abdominal:     General: Bowel sounds are normal. There is no distension.     Palpations: Abdomen is soft. There is no mass.     Tenderness: There is no abdominal tenderness. There is no guarding or rebound.  Musculoskeletal:        General: Tenderness present.     Cervical back: Normal range of motion and neck supple. No rigidity.  Lymphadenopathy:     Cervical: No cervical adenopathy.  Skin:    Findings: No erythema or rash.  Neurological:     Cranial Nerves: No cranial nerve deficit.     Motor: No abnormal muscle tone.     Coordination: Coordination normal.     Deep Tendon Reflexes: Reflexes normal.  Psychiatric:        Behavior:  Behavior normal.        Thought Content: Thought content normal.        Judgment: Judgment normal.   R shoulder is very tender w/ROM  Lab Results  Component Value Date   WBC 5.2 03/01/2022   HGB 13.3  03/01/2022   HCT 39.9 03/01/2022   PLT 312.0 03/01/2022   GLUCOSE 90 04/28/2022   CHOL 161 12/04/2020   TRIG 101.0 12/04/2020   HDL 40.20 12/04/2020   LDLCALC 101 (H) 12/04/2020   ALT 39 (H) 03/01/2022   AST 28 03/01/2022   NA 141 04/28/2022   K 4.5 04/28/2022   CL 101 04/28/2022   CREATININE 0.88 04/28/2022   BUN 13 04/28/2022   CO2 22 04/28/2022   TSH 0.65 03/01/2022    No results found.  Assessment & Plan:   Problem List Items Addressed This Visit       Cardiovascular and Mediastinum   HTN (hypertension), benign - Primary    Cont w/Amlodipine, Micardis HCT      Migraines    Ok to re-start Imitrex prn      Relevant Medications   oxyCODONE-acetaminophen (PERCOCET/ROXICET) 5-325 MG tablet   SUMAtriptan (IMITREX) 100 MG tablet     Respiratory   Allergic rhinitis    Claritin prn        Musculoskeletal and Integument   Labral tear of shoulder    R shoulder and neck pain  - pt saw Ortho Taking ASA 500 mg 3-4 a day for pain too. Asked to reduce ASA.   Gabapentin tid. Percocet tid prn  Potential benefits of a long term opioids use as well as potential risks (i.e. addiction risk, apnea etc) and complications (i.e. Somnolence, constipation and others) were explained to the patient and were aknowledged.        Other   Vitamin D deficiency    On Vit D         Meds ordered this encounter  Medications   Diclofenac Sodium 1.5 % SOLN    Sig: USE 15 DROPS THREE TIMES DAILY EXTERNALLY FOR PAIN    Dispense:  150 mL    Refill:  5   oxyCODONE-acetaminophen (PERCOCET/ROXICET) 5-325 MG tablet    Sig: Take 1 tablet by mouth every 8 (eight) hours as needed for severe pain.    Dispense:  90 tablet    Refill:  0    Please fill on or after 09/07/22   SUMAtriptan  (IMITREX) 100 MG tablet    Sig: Take one prn migraine. May repeat in 2 hours if headache persists or recurs.    Dispense:  12 tablet    Refill:  5      Follow-up: No follow-ups on file.  Sonda Primes, MD

## 2022-12-07 NOTE — Assessment & Plan Note (Signed)
Ok to re-start Imitrex prn

## 2022-12-07 NOTE — Assessment & Plan Note (Signed)
On Vit D 

## 2022-12-07 NOTE — Assessment & Plan Note (Signed)
Claritin prn 

## 2022-12-09 ENCOUNTER — Ambulatory Visit: Payer: Medicare Other | Attending: Physician Assistant | Admitting: Physician Assistant

## 2022-12-09 DIAGNOSIS — M170 Bilateral primary osteoarthritis of knee: Secondary | ICD-10-CM | POA: Diagnosis not present

## 2022-12-09 MED ORDER — HYLAN G-F 20 16 MG/2ML IX SOSY
16.0000 mg | PREFILLED_SYRINGE | INTRA_ARTICULAR | Status: AC | PRN
Start: 2022-12-09 — End: 2022-12-09
  Administered 2022-12-09: 16 mg via INTRA_ARTICULAR

## 2022-12-09 MED ORDER — LIDOCAINE HCL 1 % IJ SOLN
1.5000 mL | INTRAMUSCULAR | Status: AC | PRN
Start: 2022-12-09 — End: 2022-12-09
  Administered 2022-12-09: 1.5 mL

## 2022-12-09 NOTE — Progress Notes (Signed)
   Procedure Note  Patient: Karina Newman             Date of Birth: 09/06/1955           MRN: [id number]             Visit Date: 12/09/2022  Procedures: Visit Diagnoses:  1. Primary osteoarthritis of both knees    Synvisc #1 for both knees Large Joint Inj: bilateral knee on 12/09/2022 2:09 PM Indications: pain Details: 25 G 1.5 in needle, medial approach  Arthrogram: No  Medications (Right): 1.5 mL lidocaine 1 %; 16 mg hylan 16 MG/2ML Aspirate (Right): 0 mL Medications (Left): 1.5 mL lidocaine 1 %; 16 mg hylan 16 MG/2ML Aspirate (Left): 0 mL Outcome: tolerated well, no immediate complications Procedure, treatment alternatives, risks and benefits explained, specific risks discussed. Consent was given by the patient. Immediately prior to procedure a time out was called to verify the correct patient, procedure, equipment, support staff and site/side marked as required. Patient was prepped and draped in the usual sterile fashion.     Patient tolerated the procedures well.  Aftercare was discussed. Sherron Ales, PA-C

## 2022-12-16 ENCOUNTER — Ambulatory Visit: Payer: Medicare Other | Attending: Physician Assistant | Admitting: Physician Assistant

## 2022-12-16 DIAGNOSIS — M170 Bilateral primary osteoarthritis of knee: Secondary | ICD-10-CM | POA: Diagnosis not present

## 2022-12-16 MED ORDER — LIDOCAINE HCL 1 % IJ SOLN
1.5000 mL | INTRAMUSCULAR | Status: AC | PRN
Start: 2022-12-16 — End: 2022-12-16
  Administered 2022-12-16: 1.5 mL

## 2022-12-16 MED ORDER — HYLAN G-F 20 16 MG/2ML IX SOSY
16.0000 mg | PREFILLED_SYRINGE | INTRA_ARTICULAR | Status: AC | PRN
Start: 2022-12-16 — End: 2022-12-16
  Administered 2022-12-16: 16 mg via INTRA_ARTICULAR

## 2022-12-16 NOTE — Progress Notes (Signed)
   Procedure Note  Patient: Karina Newman             Date of Birth: November 18, 1955           MRN: [id number]             Visit Date: 12/16/2022  Procedures: Visit Diagnoses:  1. Primary osteoarthritis of both knees     Synvisc #2 bilateral knees, B/B Large Joint Inj: bilateral knee on 12/16/2022 2:12 PM Indications: pain Details: 25 G 1.5 in needle, medial approach  Arthrogram: No  Medications (Right): 1.5 mL lidocaine 1 %; 16 mg hylan 16 MG/2ML Aspirate (Right): 0 mL Medications (Left): 1.5 mL lidocaine 1 %; 16 mg hylan 16 MG/2ML Aspirate (Left): 0 mL Outcome: tolerated well, no immediate complications Procedure, treatment alternatives, risks and benefits explained, specific risks discussed. Consent was given by the patient. Immediately prior to procedure a time out was called to verify the correct patient, procedure, equipment, support staff and site/side marked as required. Patient was prepped and draped in the usual sterile fashion.     Patient tolerated the procedure well.  Procedure notes were completed above.  Aftercare was discussed. Sherron Ales, PA-C

## 2022-12-17 ENCOUNTER — Encounter: Payer: Self-pay | Admitting: Internal Medicine

## 2022-12-17 ENCOUNTER — Ambulatory Visit: Payer: Medicare Other | Admitting: Internal Medicine

## 2022-12-17 ENCOUNTER — Other Ambulatory Visit: Payer: Self-pay

## 2022-12-17 VITALS — BP 140/88 | HR 93 | Temp 97.7°F | Resp 18 | Ht 64.0 in | Wt 173.5 lb

## 2022-12-17 DIAGNOSIS — R0982 Postnasal drip: Secondary | ICD-10-CM

## 2022-12-17 DIAGNOSIS — J3089 Other allergic rhinitis: Secondary | ICD-10-CM | POA: Diagnosis not present

## 2022-12-17 DIAGNOSIS — J343 Hypertrophy of nasal turbinates: Secondary | ICD-10-CM

## 2022-12-17 DIAGNOSIS — K219 Gastro-esophageal reflux disease without esophagitis: Secondary | ICD-10-CM

## 2022-12-17 DIAGNOSIS — R0981 Nasal congestion: Secondary | ICD-10-CM

## 2022-12-17 MED ORDER — FLUTICASONE PROPIONATE 50 MCG/ACT NA SUSP
2.0000 | Freq: Every day | NASAL | 5 refills | Status: DC
Start: 2022-12-17 — End: 2023-03-18

## 2022-12-17 NOTE — Patient Instructions (Addendum)
Chronic Rhinitis: - Use nasal saline rinses before nose sprays such as with Neilmed Sinus Rinse.  Use distilled water.   - Use Flonase 2 sprays each nostril daily. Aim upward and outward. - Hold all anti histamines (benadryl, cold/cough meds, zyrtec, claritin, allegra, xyzal) 3 days prior to the next visit for skin testing.  GERD -Continue omperazole 40mg  daily.  -Avoid lying down for at least two hours after a meal or after drinking acidic beverages, like soda, or other caffeinated beverages. This can help to prevent stomach contents from flowing back into the esophagus. -Keep your head elevated while you sleep. Using an extra pillow or two can also help to prevent reflux. -Eat smaller and more frequent meals each day instead of a few large meals. This promotes digestion and can aid in preventing heartburn. -Wear loose-fitting clothes to ease pressure on the stomach, which can worsen heartburn and reflux. -Reduce excess weight around the midsection. This can ease pressure on the stomach. Such pressure can force some stomach contents back up the esophagus.   Follow up: 12/21/2022 at for skin testing at 10:45 AM with me

## 2022-12-17 NOTE — Progress Notes (Signed)
NEW PATIENT  Date of Service/Encounter:  12/17/22  Consult requested by: Tresa Garter, MD   Subjective:   Karina Newman (DOB: [birth year]) is a 67 y.o. female who presents to the clinic on 12/17/2022 with a chief complaint of Advice Only .    History obtained from: chart review and patient.   Rhinitis:  Started around age 61s.. Symptoms have worsened since December 2023.  Symptoms include:  wet cough, nasal congestion, rhinorrhea, post nasal drainage, sneezing, and watery eyes  Occurs year-round Potential triggers: not sure  Treatments tried:  Benadryl; last use was yesterday Has tried Claritin without much relief.  Flonase   Previous allergy testing: yes around age 36s, can't recall exact results  History of sinus surgery: no Nonallergic triggers: none   GERD Doing well on Prilosec daily; if she misses a dose then she has heartburn Denies any reflux, heartburn, burning pain, sour taste. EGD 2022 with hiatal hernia, gastric nodule, atrophic mucosa in stomach with some erythema of antrum.    Past Medical History: Past Medical History:  Diagnosis Date   Abnormal cervical Pap smear with positive HPV DNA test 01/2014,01/2017   2015 Normal cytology with positive high-risk HPV 18/45. Colposcopy negative with negative ECC.  2018 normal cytology with positive high-risk HPV 18/45   Anemia    Apnea 05/17/2018   Autoimmune gastritis    Fatty liver    Gastritis    Heart murmur    Hypertension    Hyperthyroidism    Inappropriate sinus tachycardia 05/17/2018   Internal hemorrhoids    Osteoarthritis    Right shoulder pain 2001   chronic pain   Thyroid disease    Hyperthyroid. Was on PTU (Dr Willaim Bane in [place]) - stopped in 2013, stable   Tubular adenoma of colon     Past Surgical History: Past Surgical History:  Procedure Laterality Date   BIOPSY  03/11/2022   Procedure: BIOPSY;  Surgeon: Lemar Lofty., MD;  Location: [facility] ENDOSCOPY;  Service: Gastroenterology;;    BREAST CYST ASPIRATION Left 2012   CATARACT EXTRACTION Right 06/15/2022   CATARACT EXTRACTION Left 06/28/2022   CESAREAN SECTION     x 4    COLONOSCOPY     ESOPHAGOGASTRODUODENOSCOPY (EGD) WITH PROPOFOL N/A 03/11/2022   Procedure: ESOPHAGOGASTRODUODENOSCOPY (EGD) WITH PROPOFOL;  Surgeon: Lemar Lofty., MD;  Location: [facility] ENDOSCOPY;  Service: Gastroenterology;  Laterality: N/A;   EUS N/A 03/11/2022   Procedure: UPPER ENDOSCOPIC ULTRASOUND (EUS) RADIAL;  Surgeon: Lemar Lofty., MD;  Location: [facility] ENDOSCOPY;  Service: Gastroenterology;  Laterality: N/A;   HAND SURGERY Left    SHOULDER SURGERY Right 2002   SHOULDER SURGERY Left 2014   TUBAL LIGATION     UPPER GASTROINTESTINAL ENDOSCOPY      Family History: Family History  Problem Relation Age of Onset   Kidney disease Mother        ESRD   Hypertension Mother    Lung cancer Father    Hypertension Sister    Kidney disease Sister    Hypertension Sister    Diabetes Brother    Hypertension Brother    Hypertension Brother    Diabetes Paternal Grandmother    Sleep apnea Son    Hypertension Son    Colon cancer Neg Hx    Stomach cancer Neg Hx    Esophageal cancer Neg Hx    Rectal cancer Neg Hx    Pancreatic cancer Neg Hx     Social History:  Lives in a 12  year townhouse Flooring in bedroom: carpet Pets: dog Tobacco use/exposure: none Job: retired  Medication List:  Allergies as of 12/17/2022       Reactions   Penicillins Hives   Tramadol Anaphylaxis   Memory lapses   Iodine Other (See Comments)   Due to hyperthyroidism   Lexapro [escitalopram]    fatigue   Lyrica [pregabalin] Dermatitis   Nsaids    gastritis   Voltaren [diclofenac] Hives        Medication List        Accurate as of Dec 17, 2022  9:30 AM. If you have any questions, ask your nurse or doctor.          STOP taking these medications    ipratropium 0.06 % nasal spray Commonly known as: ATROVENT Stopped by: Birder Robson,  MD   PriLOSEC OTC 20 MG tablet Generic drug: omeprazole Stopped by: Birder Robson, MD       TAKE these medications    acetaminophen 500 MG tablet Commonly known as: TYLENOL Take 1,000 mg by mouth every 6 (six) hours as needed for moderate pain.   amLODipine 5 MG tablet Commonly known as: NORVASC TAKE 1 TABLET (5 MG TOTAL) BY MOUTH DAILY.   carvedilol 12.5 MG tablet Commonly known as: COREG Take 1 tablet (12.5 mg total) by mouth 2 (two) times daily.   cyclobenzaprine 5 MG tablet Commonly known as: FLEXERIL TAKE 1 TABLET BY MOUTH THREE TIMES A DAY AS NEEDED FOR MUSCLE SPASMS   cycloSPORINE 0.05 % ophthalmic emulsion Commonly known as: RESTASIS Place 1 drop into both eyes 2 (two) times daily.   Diclofenac Sodium 1.5 % Soln USE 15 DROPS THREE TIMES DAILY EXTERNALLY FOR PAIN   dicyclomine 10 MG capsule Commonly known as: BENTYL TAKE 1 CAPSULE (10 MG TOTAL) BY MOUTH EVERY 8 (EIGHT) HOURS AS NEEDED FOR SPASMS.   Difluprednate 0.05 % Emul Apply 1 drop to eye 4 (four) times daily.   diphenhydrAMINE 25 MG tablet Commonly known as: BENADRYL Take 25 mg by mouth every 6 (six) hours as needed for allergies.   doxazosin 4 MG tablet Commonly known as: CARDURA TAKE 1 TABLET BY MOUTH EVERY DAY   DULoxetine 20 MG capsule Commonly known as: Cymbalta Take 1 capsule (20 mg total) by mouth daily.   gabapentin 100 MG capsule Commonly known as: NEURONTIN TAKE 1 CAPSULE BY MOUTH THREE TIMES A DAY AS NEEDED   LORazepam 0.5 MG tablet Commonly known as: ATIVAN TAKE 1 TABLET (0.5 MG TOTAL) BY MOUTH 2 (TWO) TIMES DAILY AS NEEDED FOR ANXIETY OR SLEEP.   omeprazole 40 MG capsule Commonly known as: PRILOSEC TAKE 1 CAPSULE BY MOUTH EVERY DAY IN THE MORNING AND AT BEDTIME   oxyCODONE-acetaminophen 5-325 MG tablet Commonly known as: PERCOCET/ROXICET Take 1 tablet by mouth every 8 (eight) hours as needed for severe pain.   Potassium Chloride ER 20 MEQ Tbcr Take 1 tablet (20 mEq total)  by mouth daily.   prednisoLONE acetate 1 % ophthalmic suspension Commonly known as: PRED FORTE Place 1 drop into the right eye daily.   Prolensa 0.07 % Soln Generic drug: Bromfenac Sodium Apply to eye.   pseudoephedrine 120 MG 12 hr tablet Commonly known as: SUDAFED Take by mouth.   sucralfate 1 g tablet Commonly known as: CARAFATE TAKE 1 TABLET BY MOUTH 3 TIMES A DAY BETWEEN MEALS   SUMAtriptan 100 MG tablet Commonly known as: IMITREX Take one prn migraine. May repeat in 2 hours if headache  persists or recurs.   telmisartan-hydrochlorothiazide 80-25 MG tablet Commonly known as: MICARDIS HCT Take 1 tablet by mouth daily.   traZODone 50 MG tablet Commonly known as: DESYREL TAKE 1 TABLET BY MOUTH EVERYDAY AT BEDTIME   Vitamin B-12 500 MCG Subl 1 sl qd   Vitamin D (Cholecalciferol) 25 MCG (1000 UT) Caps Take 2,000 Units by mouth daily.   Cholecalciferol 50 MCG (2000 UT) Caps Take by mouth.         REVIEW OF SYSTEMS: Pertinent positives and negatives discussed in HPI.   Objective:   Physical Exam: BP (!) 140/88 (BP Location: Right Arm, Patient Position: Sitting, Cuff Size: Normal)   Pulse 93   Temp 97.7 F (36.5 C) (Temporal)   Resp 18   Ht 5\' 4"  (1.626 m)   Wt 173 lb 8 oz (78.7 kg)   SpO2 98%   BMI 29.78 kg/m  Body mass index is 29.78 kg/m. GEN: alert, well developed HEENT: clear conjunctiva, TM grey and translucent, nose with + inferior turbinate hypertrophy, pink nasal mucosa, slight clear rhinorrhea, + cobblestoning HEART: regular rate and rhythm, no murmur LUNGS: clear to auscultation bilaterally, no coughing, unlabored respiration ABDOMEN: soft, non distended  SKIN: no rashes or lesions  Reviewed:  12/07/2022: seen by Dr. Posey Rea for complain of congestion and allergies, on Claritin PRN.  Referred to Allergy already.   11/02/2022: seen by [place]hour PA for nasal congestion, nasal drainage, headaches. With LPR on PPI. CT scan of sinuses: All  paranasal sinuses are well aerated with patent drainage pathways. There is a small amount of opacification and floor of left maxillary sinus. Discussed use of Atrovent and referral to Allergy.   04/20/2022: seen by Dr. [facility] for chest pain, HTN, lightheadedness, sinus tach. Discussed BP management and coreg.   Assessment:   1. Other allergic rhinitis   2. Gastroesophageal reflux disease without esophagitis     Plan/Recommendations:  Chronic Rhinitis: - Due to turbinate hypertrophy and unresponsive to OTC meds, will perform skin testing to identify aeroallergen triggers at next visit with adult panel 1-59.  - Use nasal saline rinses before nose sprays such as with Neilmed Sinus Rinse.  Use distilled water.   - Use Flonase 2 sprays each nostril daily. Aim upward and outward. - Hold all anti histamines (benadryl, cold/cough meds, zyrtec, claritin, allegra, xyzal) 3 days prior to the next visit for skin testing.  GERD -Continue omperazole 40mg  daily.  -Avoid lying down for at least two hours after a meal or after drinking acidic beverages, like soda, or other caffeinated beverages. This can help to prevent stomach contents from flowing back into the esophagus. -Keep your head elevated while you sleep. Using an extra pillow or two can also help to prevent reflux. -Eat smaller and more frequent meals each day instead of a few large meals. This promotes digestion and can aid in preventing heartburn. -Wear loose-fitting clothes to ease pressure on the stomach, which can worsen heartburn and reflux. -Reduce excess weight around the midsection. This can ease pressure on the stomach. Such pressure can force some stomach contents back up the esophagus.   Return in about 4 days (around 12/21/2022).  [name], MD [facility]

## 2022-12-21 ENCOUNTER — Encounter: Payer: Self-pay | Admitting: Internal Medicine

## 2022-12-21 ENCOUNTER — Other Ambulatory Visit: Payer: Self-pay

## 2022-12-21 ENCOUNTER — Ambulatory Visit: Payer: Medicare Other | Admitting: Internal Medicine

## 2022-12-21 VITALS — BP 120/86 | HR 99 | Temp 98.2°F | Ht 64.0 in | Wt 173.8 lb

## 2022-12-21 DIAGNOSIS — J3089 Other allergic rhinitis: Secondary | ICD-10-CM

## 2022-12-21 MED ORDER — CETIRIZINE HCL 10 MG PO TABS: 10.0000 mg | ORAL_TABLET | Freq: Every day | ORAL | 5 refills | Status: DC

## 2022-12-21 MED ORDER — AZELASTINE HCL 0.1 % NA SOLN: 1.0000 | Freq: Two times a day (BID) | NASAL | 5 refills | Status: DC

## 2022-12-21 NOTE — Progress Notes (Signed)
FOLLOW UP Date of Service/Encounter:  12/21/22   Subjective:  Karina Newman (DOB: [birth year]) is a 67 y.o. female who [facility] for follow up for skin testing.   History obtained from: chart review and patient. Still having lots of rhinorrhea. Off anti histamines.   Past Medical History: Past Medical History:  Diagnosis Date   Abnormal cervical Pap smear with positive HPV DNA test 01/2014,01/2017   2015 Normal cytology with positive high-risk HPV 18/45. Colposcopy negative with negative ECC.  2018 normal cytology with positive high-risk HPV 18/45   Anemia    Apnea 05/17/2018   Autoimmune gastritis    Fatty liver    Gastritis    Heart murmur    Hypertension    Hyperthyroidism    Inappropriate sinus tachycardia 05/17/2018   Internal hemorrhoids    Osteoarthritis    Right shoulder pain 2001   chronic pain   Thyroid disease    Hyperthyroid. Was on PTU (Dr Willaim Bane in [place]) - stopped in 2013, stable   Tubular adenoma of colon     Objective:  BP (!) 150/90   Pulse 99   Temp 98.2 F (36.8 C)   Ht 5\' 4"  (1.626 m)   Wt 173 lb 12.8 oz (78.8 kg)   SpO2 98%   BMI 29.83 kg/m  Body mass index is 29.83 kg/m. Physical Exam: GEN: alert, well developed HEENT: clear conjunctiva, MMM HEART: regular rate  LUNGS: no coughing, unlabored respiration SKIN: no rashes or lesions  Skin Testing:  Skin prick testing was placed, which includes aeroallergens/foods, histamine control, and saline control.  Verbal consent was obtained prior to placing test.  Patient tolerated procedure well.  Allergy testing results were read and interpreted by myself, documented by clinical staff. Adequate positive and negative control.  Positive results to:  Results discussed with patient/family.  Airborne Adult Perc - 12/21/22 1045     Time Antigen Placed 1051    Allergen Manufacturer Greer    Location Back    Number of Test 58    1. Control-Buffer 50% Glycerol  Negative    2. Control-Histamine 1 mg/ml 3+    3. Albumin saline Negative    4. Bahia Negative    5. French Southern Territories Negative    6. Johnson Negative    7. Kentucky Blue Negative    8. Meadow Fescue Negative    9. Perennial Rye Negative    11. Timothy Negative    12. Cocklebur Negative    13. Burweed Marshelder Negative    14. Ragweed, short Negative    15. Ragweed, Giant Negative    16. Plantain,  English Negative    17. Lamb's Quarters Negative    18. Sheep Sorrell Negative    19. Rough Pigweed Negative    20. Marsh Elder, Rough Negative    21. Mugwort, Common Negative    22. Ash mix Negative    23. Birch mix Negative    24. Beech American Negative    25. Box, Elder Negative    26. Cedar, red Negative    27. Cottonwood, Guinea-Bissau Negative    28. Elm mix Negative    29. Hickory Negative    30. Maple mix Negative    31. Oak, Guinea-Bissau mix Negative    32. Pecan Pollen Negative    33. Pine mix Negative    34. Sycamore Eastern Negative    35. Walnut, Black Pollen Negative    36. Alternaria alternata  Negative    37. Cladosporium Herbarum Negative    38. Aspergillus mix Negative    39. Penicillium mix Negative    40. Bipolaris sorokiniana (Helminthosporium) Negative    41. Drechslera spicifera (Curvularia) Negative    42. Mucor plumbeus Negative    43. Fusarium moniliforme Negative    44. Aureobasidium pullulans (pullulara) Negative    45. Rhizopus oryzae Negative    46. Botrytis cinera Negative    47. Epicoccum nigrum Negative    48. Phoma betae Negative    49. Candida Albicans Negative    50. Trichophyton mentagrophytes Negative    51. Mite, D Farinae  5,000 AU/ml Negative    52. Mite, D Pteronyssinus  5,000 AU/ml Negative    53. Cat Hair 10,000 BAU/ml 3+    54.  Dog Epithelia Negative    55. Mixed Feathers Negative    56. Horse Epithelia Negative    57. Cockroach, German Negative    58. Mouse Negative    59. Tobacco Leaf Negative             Intradermal - 12/21/22  1200     Time Antigen Placed 1138    Allergen Manufacturer Waynette Buttery    Location Arm    Number of Test 14    Control Negative    French Southern Territories Negative    Johnson Negative    7 Grass Negative    Ragweed mix Negative    Weed mix Negative    Tree mix Negative    Mold 1 Negative    Mold 2 Negative    Mold 3 2+    Mold 4 2+    Dog Negative    Cockroach 2+    Mite mix Negative              Assessment:   1. Perennial allergic rhinitis     Plan/Recommendations:  Allergic Rhinitis: - Due to turbinate hypertrophy and unresponsive to OTC meds, performed skin testing to identify aeroallergen triggers.   - Positive skin test to: mold, cat, cockroach.  - Avoidance measures discussed. - Use nasal saline rinses before nose sprays such as with Neilmed Sinus Rinse.  Use distilled water.   - Use Flonase 2 sprays each nostril daily. Aim upward and outward. - Use Azelastine 1-2 sprays each nostril twice daily. Aim upward and outward. - Use Zyrtec 10 mg daily. This replaced Claritin.   - For eyes, use Olopatadine or Ketotifen 1 eye drop daily as needed for itchy, watery eyes.  Available over the counter, if not covered by insurance.  - Consider allergy shots as long term control of your symptoms by teaching your immune system to be more tolerant of your allergy triggers  GERD -Continue omperazole 40mg  daily.  -Avoid lying down for at least two hours after a meal or after drinking acidic beverages, like soda, or other caffeinated beverages. This can help to prevent stomach contents from flowing back into the esophagus. -Keep your head elevated while you sleep. Using an extra pillow or two can also help to prevent reflux. -Eat smaller and more frequent meals each day instead of a few large meals. This promotes digestion and can aid in preventing heartburn. -Wear loose-fitting clothes to ease pressure on the stomach, which can worsen heartburn and reflux. -Reduce excess weight around the midsection.  This can ease pressure on the stomach. Such pressure can force some stomach contents back up the esophagus.    Return in about 6 weeks (around 02/01/2023).  Harlon Flor, MD [facility] [place]

## 2022-12-21 NOTE — Patient Instructions (Addendum)
Allergic Rhinitis: - Positive skin test to: mold, cat, cockroach.  - Avoidance measures discussed. - Use nasal saline rinses before nose sprays such as with Neilmed Sinus Rinse.  Use distilled water.   - Use Flonase 2 sprays each nostril daily. Aim upward and outward. - Use Azelastine 1-2 sprays each nostril twice daily. Aim upward and outward. - Use Zyrtec (Cetirizine) 10 mg daily. This replaced Claritin.   - For eyes, use Olopatadine or Ketotifen 1 eye drop daily as needed for itchy, watery eyes.  Available over the counter, if not covered by insurance.  - Consider allergy shots as long term control of your symptoms by teaching your immune system to be more tolerant of your allergy triggers.  GERD -Continue omperazole 40mg  daily.  -Avoid lying down for at least two hours after a meal or after drinking acidic beverages, like soda, or other caffeinated beverages. This can help to prevent stomach contents from flowing back into the esophagus. -Keep your head elevated while you sleep. Using an extra pillow or two can also help to prevent reflux. -Eat smaller and more frequent meals each day instead of a few large meals. This promotes digestion and can aid in preventing heartburn. -Wear loose-fitting clothes to ease pressure on the stomach, which can worsen heartburn and reflux. -Reduce excess weight around the midsection. This can ease pressure on the stomach. Such pressure can force some stomach contents back up the esophagus.   ALLERGEN AVOIDANCE MEASURES  Molds - Indoor avoidance Use air conditioning to reduce indoor humidity.  Do not use a humidifier. Keep indoor humidity at 30 - 40%.  Use a dehumidifier if needed. In the bathroom use an exhaust fan or open a window after showering.  Wipe down damp surfaces after showering.  Clean bathrooms with a mold-killing solution (diluted bleach, or products like Tilex, etc) at least once a month. In the kitchen use an exhaust fan to remove steam from  cooking.  Throw away spoiled foods immediately, and empty garbage daily.  Empty water pans below self-defrosting refrigerators frequently. Vent the clothes dryer to the outside. Limit indoor houseplants; mold grows in the dirt.  No houseplants in the bedroom. Remove carpet from the bedroom. Encase the mattress and box springs with a zippered encasing.  Molds - Outdoor avoidance Avoid being outside when the grass is being mowed, or the ground is tilled. Avoid playing in leaves, pine straw, hay, etc.  Dead plant materials contain mold. Avoid going into barns or grain storage areas. Remove leaves, clippings and compost from around the home.  Cockroach Limit spread of food around the house; especially keep food out of bedrooms. Keep food and garbage in closed containers with a tight lid.  Never leave food out in the kitchen.  Do not leave out pet food or dirty food bowls. Mop the kitchen floor and wash countertops at least once a week. Repair leaky pipes and faucets so there is no standing water to attract roaches. Plug up cracks in the house through which cockroaches can enter. Use bait stations and approved pesticides to reduce cockroach infestation.  Pet Dander Keep the pet out of your bedroom and restrict it to only a few rooms. Be advised that keeping the pet in only one room will not limit the allergens to that room. Don't pet, hug or kiss the pet; if you do, wash your hands with soap and water. High-efficiency particulate air (HEPA) cleaners run continuously in a bedroom or living room can reduce allergen  levels over time. Regular use of a high-efficiency vacuum cleaner or a central vacuum can reduce allergen levels. Giving your pet a bath at least once a week can reduce airborne allergen.

## 2022-12-23 ENCOUNTER — Ambulatory Visit: Payer: Medicare Other | Attending: Physician Assistant | Admitting: Physician Assistant

## 2022-12-23 DIAGNOSIS — M170 Bilateral primary osteoarthritis of knee: Secondary | ICD-10-CM

## 2022-12-23 MED ORDER — HYLAN G-F 20 16 MG/2ML IX SOSY
16.0000 mg | PREFILLED_SYRINGE | INTRA_ARTICULAR | Status: AC | PRN
Start: 2022-12-23 — End: 2022-12-23
  Administered 2022-12-23: 16 mg via INTRA_ARTICULAR

## 2022-12-23 MED ORDER — LIDOCAINE HCL 1 % IJ SOLN
1.5000 mL | INTRAMUSCULAR | Status: AC | PRN
  Administered 2022-12-23: 1.5 mL

## 2022-12-23 MED ORDER — LIDOCAINE HCL 1 % IJ SOLN
1.5000 mL | INTRAMUSCULAR | Status: AC | PRN
Start: 2022-12-23 — End: 2022-12-23
  Administered 2022-12-23: 1.5 mL

## 2022-12-23 NOTE — Progress Notes (Signed)
   Procedure Note  Patient: Karina Newman             Date of Birth: 1956/03/26           MRN: [id number]             Visit Date: 12/23/2022  Procedures: Visit Diagnoses:  1. Primary osteoarthritis of both knees    Synvisc #3 bilateral knee joint injections  Large Joint Inj: bilateral knee on 12/23/2022 1:49 PM Indications: pain Details: 27 G 1.5 in needle, medial approach  Arthrogram: No  Medications (Right): 1.5 mL lidocaine 1 %; 16 mg hylan 16 MG/2ML Aspirate (Right): 0 mL Medications (Left): 1.5 mL lidocaine 1 %; 16 mg hylan 16 MG/2ML Aspirate (Left): 0 mL Outcome: tolerated well, no immediate complications Procedure, treatment alternatives, risks and benefits explained, specific risks discussed. Consent was given by the patient. Immediately prior to procedure a time out was called to verify the correct patient, procedure, equipment, support staff and site/side marked as required. Patient was prepped and draped in the usual sterile fashion.      Patient tolerated the procedure well.  Aftercare was discussed. Sherron Ales, PA-C

## 2022-12-27 ENCOUNTER — Other Ambulatory Visit: Payer: Self-pay | Admitting: Internal Medicine

## 2022-12-31 DIAGNOSIS — H35373 Puckering of macula, bilateral: Secondary | ICD-10-CM | POA: Diagnosis not present

## 2022-12-31 DIAGNOSIS — H59031 Cystoid macular edema following cataract surgery, right eye: Secondary | ICD-10-CM | POA: Diagnosis not present

## 2022-12-31 DIAGNOSIS — H43813 Vitreous degeneration, bilateral: Secondary | ICD-10-CM | POA: Diagnosis not present

## 2022-12-31 DIAGNOSIS — H43392 Other vitreous opacities, left eye: Secondary | ICD-10-CM | POA: Diagnosis not present

## 2023-02-04 DIAGNOSIS — H43391 Other vitreous opacities, right eye: Secondary | ICD-10-CM | POA: Diagnosis not present

## 2023-02-04 DIAGNOSIS — H43813 Vitreous degeneration, bilateral: Secondary | ICD-10-CM | POA: Diagnosis not present

## 2023-02-04 DIAGNOSIS — H35373 Puckering of macula, bilateral: Secondary | ICD-10-CM | POA: Diagnosis not present

## 2023-02-04 DIAGNOSIS — H43392 Other vitreous opacities, left eye: Secondary | ICD-10-CM | POA: Diagnosis not present

## 2023-02-04 DIAGNOSIS — H59031 Cystoid macular edema following cataract surgery, right eye: Secondary | ICD-10-CM | POA: Diagnosis not present

## 2023-03-07 NOTE — Progress Notes (Unsigned)
Office Visit Note  Patient: Karina Newman             Date of Birth: 05/12/1956           MRN: [id number]             PCP: Tresa Garter, MD Referring: Tresa Garter, MD Visit Date: 03/16/2023 Occupation: @GUAROCC @  Subjective:  Pain in both knees   History of Present Illness: Leenah Seidner is a 67 y.o. female with history of osteoarthritis and DDD.  Patient presents today with increased pain involving both hands and both knee joints.  Patient states that starting on 02/08/2023 while riding in the car for 3 hours she started to have increased pain in her right knee joint.  She states that her pain was most severe with flexion while sitting.  She denies any mechanical symptoms.  She denies any recent injury or fall.  Patient states that while traveling she wear a brace as well as use diclofenac solution for pain relief.  She also tried soaking her knees in warm water.  Patient states she has had some increased discomfort in the left knee but her right knee is most severe.  She states she is also had some increased pain and stiffness in her hands.  She denies any joint swelling in her hands, wrist joints, or knee joints.  She states that while riding in the car she had noticed increased swelling in her ankles but denies any pain in the ankle joints. She currently rates the pain in her right knee and 8 out of 10.  She had initially noticed improvement in her knee joint pain after undergoing viscosupplementation for both knees in April/May 2024.  She is unsure what triggered the increased discomfort and stiffness in her knees other than riding in the car for prolonged period of time.  Activities of Daily Living:  Patient reports morning stiffness for all day. Patient Reports nocturnal pain.  Difficulty dressing/grooming: Reports Difficulty climbing stairs: Reports Difficulty getting out of chair: Reports Difficulty using hands for taps, buttons, cutlery, and/or writing:  Denies  Review of Systems  Constitutional:  Positive for fatigue.  HENT:  Negative for mouth sores and mouth dryness.   Eyes:  Positive for dryness.  Respiratory:  Positive for shortness of breath.   Cardiovascular:  Negative for palpitations.  Gastrointestinal:  Negative for blood in stool, constipation and diarrhea.  Endocrine: Negative for increased urination.  Genitourinary:  Negative for involuntary urination.  Musculoskeletal:  Positive for joint pain, gait problem, joint pain, joint swelling, myalgias, morning stiffness and myalgias. Negative for muscle weakness and muscle tenderness.  Skin:  Negative for color change, rash, hair loss and sensitivity to sunlight.  Allergic/Immunologic: Negative for susceptible to infections.  Neurological:  Positive for light-headedness, headaches and parasthesias. Negative for dizziness.  Hematological:  Negative for swollen glands.  Psychiatric/Behavioral:  Positive for sleep disturbance. Negative for depressed mood. The patient is nervous/anxious.     PMFS History:  Patient Active Problem List   Diagnosis Date Noted   Nasal congestion 11/02/2022   Laryngopharyngeal reflux 10/13/2022   Gastritis 03/01/2022   Toe pain, chronic, right 09/17/2020   Grief 07/24/2020   Dysphagia 01/02/2020   Globus sensation 01/02/2020   Thoracic back pain 12/19/2019   Sore throat 10/24/2019   Goiter 10/24/2019   Cough 10/24/2019   Neoplasm of uncertain behavior of skin 09/27/2019   Pain of right breast 06/03/2019   Fatty liver 01/23/2019   Hand pain  09/28/2018   Knee pain 09/28/2018   Inappropriate sinus tachycardia 05/17/2018   Apnea 05/17/2018   Upper respiratory infection 09/23/2017   Fatigue 09/12/2017   Well adult exam 01/19/2017   Abdominal pain 01/19/2017   Headache 10/27/2016   Pain in right shoulder 09/16/2016   Left hip pain 09/16/2016   Low vitamin B12 level 03/16/2016   Piriformis syndrome of right side 02/04/2016   IT band syndrome  02/04/2016   RUQ abdominal pain 02/04/2016   Dysfunction of both eustachian tubes 12/11/2015   Hoarseness 12/11/2015   Acute serous otitis media of left ear 10/08/2015   Low back pain radiating to right lower extremity 09/19/2015   Allergic rhinitis 09/19/2015   Atypical chest pain 04/07/2015   GERD (gastroesophageal reflux disease) 04/07/2015   Axillary adenitis 03/23/2015   Migraines 03/23/2015   Hives 01/29/2015   Edema 01/29/2015   Cervical disc disorder with radiculopathy of cervical region 12/11/2014   Insomnia 07/31/2014   Degenerative cervical disc 05/08/2014   Pain in joint, shoulder region 03/05/2014   Patellofemoral arthralgia of both knees 03/01/2014   Bilateral shoulder pain 10/18/2013   Sinusitis, bacterial 08/20/2013   URI, acute 08/14/2013   Hemoptysis 08/14/2013   Otitis media of left ear 08/14/2013   Labral tear of shoulder 07/03/2013   Hyperthyroidism 07/03/2013   HTN (hypertension), benign 07/03/2013   LVH (left ventricular hypertrophy) due to hypertensive disease 07/03/2013   Vitamin D deficiency 07/03/2013    Past Medical History:  Diagnosis Date   Abnormal cervical Pap smear with positive HPV DNA test 01/2014,01/2017   2015 Normal cytology with positive high-risk HPV 18/45. Colposcopy negative with negative ECC.  2018 normal cytology with positive high-risk HPV 18/45   Anemia    Apnea 05/17/2018   Autoimmune gastritis    Fatty liver    Gastritis    Heart murmur    Hypertension    Hyperthyroidism    Inappropriate sinus tachycardia 05/17/2018   Internal hemorrhoids    Osteoarthritis    Right shoulder pain 2001   chronic pain   Thyroid disease    Hyperthyroid. Was on PTU (Dr Willaim Bane in [place]) - stopped in 2013, stable   Tubular adenoma of colon     Family History  Problem Relation Age of Onset   Kidney disease Mother        ESRD   Hypertension Mother    Lung cancer Father    Hypertension Sister    Kidney disease Sister    Hypertension Sister     Diabetes Brother    Hypertension Brother    Hypertension Brother    Diabetes Paternal Grandmother    Sleep apnea Son    Hypertension Son    Colon cancer Neg Hx    Stomach cancer Neg Hx    Esophageal cancer Neg Hx    Rectal cancer Neg Hx    Pancreatic cancer Neg Hx    Past Surgical History:  Procedure Laterality Date   BIOPSY  03/11/2022   Procedure: BIOPSY;  Surgeon: Meridee Score, Netty Starring., MD;  Location: [facility] ENDOSCOPY;  Service: Gastroenterology;;   BREAST CYST ASPIRATION Left 2012   CATARACT EXTRACTION Right 06/15/2022   CATARACT EXTRACTION Left 06/28/2022   CESAREAN SECTION     x 4    COLONOSCOPY     ESOPHAGOGASTRODUODENOSCOPY (EGD) WITH PROPOFOL N/A 03/11/2022   Procedure: ESOPHAGOGASTRODUODENOSCOPY (EGD) WITH PROPOFOL;  Surgeon: Lemar Lofty., MD;  Location: [facility] ENDOSCOPY;  Service: Gastroenterology;  Laterality: N/A;   EUS  N/A 03/11/2022   Procedure: UPPER ENDOSCOPIC ULTRASOUND (EUS) RADIAL;  Surgeon: Lemar Lofty., MD;  Location: [facility] ENDOSCOPY;  Service: Gastroenterology;  Laterality: N/A;   HAND SURGERY Left    SHOULDER SURGERY Right 2002   SHOULDER SURGERY Left 2014   TUBAL LIGATION     UPPER GASTROINTESTINAL ENDOSCOPY     Social History   Social History Narrative   Not on file   Immunization History  Administered Date(s) Administered   Tdap 10/17/2021   Zoster Recombinant(Shingrix) 03/25/2017, 06/27/2017     Objective: Vital Signs: BP 130/88 (BP Location: Left Arm, Patient Position: Sitting, Cuff Size: Normal)   Pulse (!) 101   Resp 15   Ht 5\' 4"  (1.626 m)   Wt 173 lb 12.8 oz (78.8 kg)   BMI 29.83 kg/m    Physical Exam Vitals and nursing note reviewed.  Constitutional:      Appearance: She is well-developed.  HENT:     Head: Normocephalic and atraumatic.  Eyes:     Conjunctiva/sclera: Conjunctivae normal.  Cardiovascular:     Rate and Rhythm: Normal rate and regular rhythm.     Heart sounds: Normal heart sounds.  Pulmonary:      Effort: Pulmonary effort is normal.     Breath sounds: Normal breath sounds.  Abdominal:     General: Bowel sounds are normal.     Palpations: Abdomen is soft.  Musculoskeletal:     Cervical back: Normal range of motion.  Lymphadenopathy:     Cervical: No cervical adenopathy.  Skin:    General: Skin is warm and dry.     Capillary Refill: Capillary refill takes less than 2 seconds.  Neurological:     Mental Status: She is alert and oriented to person, place, and time.  Psychiatric:        Behavior: Behavior normal.      Musculoskeletal Exam: C-spine, thoracic spine, lumbar spine have good range of motion.  Right shoulder is limited and painful range of motion.  Left shoulder is full range of motion.  Elbow joints, wrist joints, MCPs, PIPs, DIPs have good range of motion with no synovitis.  Complete fist formation bilaterally.  No tenderness or inflammation over MCP joints.  Some tenderness over PIP and DIP joints.  Hip joints have good range of motion with no groin pain.  Painful range of motion of both knee joints especially with full flexion of the right knee.  No warmth or effusion noted in the knee joints currently.  Ankle joints have good range of motion with no tenderness or synovitis.  CDAI Exam: CDAI Score: -- Patient Global: --; Provider Global: -- Swollen: --; Tender: -- Joint Exam 03/16/2023   No joint exam has been documented for this visit   There is currently no information documented on the homunculus. Go to the Rheumatology activity and complete the homunculus joint exam.  Investigation: No additional findings.  Imaging: No results found.  Recent Labs: Lab Results  Component Value Date   WBC 5.2 03/01/2022   HGB 13.3 03/01/2022   PLT 312.0 03/01/2022   NA 141 04/28/2022   K 4.5 04/28/2022   CL 101 04/28/2022   CO2 22 04/28/2022   GLUCOSE 90 04/28/2022   BUN 13 04/28/2022   CREATININE 0.88 04/28/2022   BILITOT 0.5 03/01/2022   ALKPHOS 98 03/01/2022    AST 28 03/01/2022   ALT 39 (H) 03/01/2022   PROT 8.0 03/01/2022   ALBUMIN 4.5 03/01/2022   CALCIUM 9.9 04/28/2022  GFRAA 78 06/20/2018    Speciality Comments: No specialty comments available.  Procedures:  No procedures performed Allergies: Penicillins, Tramadol, Iodine, Lexapro [escitalopram], Lyrica [pregabalin], Nsaids, and Voltaren [diclofenac]   Assessment / Plan:     Visit Diagnoses: Positive ANA (antinuclear antibody) - ANA 1:40 cytoplasmic 10/21/21.plan to obtain the following lab work today.  Plan: ANA, C3 and C4, Anti-DNA antibody, double-stranded, CBC with Differential/Platelet, COMPLETE METABOLIC PANEL WITH GFR  Positive anti-CCP test -Anti-CCP was 21 on 10/21/21: Patient presents today with increased pain and stiffness in both hands and both knee joints.  Her pain is most severe in the right knee joint.  She has painful range of motion of both knees especially with full flexion of the right knee.  No warmth or effusion noted on examination today.  Due to the severity of her discomfort as well as history of positive anti-CCP antibody the following lab work will be rechecked today.  Patient has declined a right knee joint cortisone injection or a prednisone taper at this time.  Plan: Sedimentation rate, Rheumatoid factor, Cyclic citrul peptide antibody, IgG, C-reactive protein  Pain in both hands -X-rays of both hands were consistent with osteoarthritis on 10/21/2021.  Patient presents today with increased pain and stiffness in both hands for the past 1 month.  On examination she has good range of motion of both wrist joints with no tenderness or synovitis.  No tenderness or synovitis over MCP joints.  She has some tenderness over the PIP and DIP joints but no inflammation was noted.  She was able to make a complete fist bilaterally.  Patient has history of positive anti-CCP antibody.  The following lab work will be obtained today for further evaluation.  Plan: Sedimentation rate,  Rheumatoid factor, Cyclic citrul peptide antibody, IgG, C-reactive protein, Uric acid  Chronic pain of both knees - Patient presents today with acute on chronic pain in both knees.  X-rays of both knees were updated on 11/15/2022 which were consistent with moderate osteoarthritis and moderate chondromalacia patella.  No radiographic progression was noted when compared to x-rays from 2021.  The patient underwent viscosupplementation for both knees in April/May 2024 initially noticed significant improvement.  On 02/08/2023 she was riding in the car for prolonged period of time and within 3 hours she started to have increased discomfort in both knees especially her right knee.  She tried using diclofenac solution with minimal relief.  She has been using braces on both knees and has been elevating her knees especially at night.  She has not had any injury or fall recently.  She is not experiencing any mechanical symptoms.  She has not noticed any joint swelling other than when she was riding in the car and had increased fluid retention in both lower legs.  On examination she has painful range of motion of both knee joints right greater than left.  Her discomfort is most severe with full flexion of the right knee.  No warmth or effusion noted on exam.  No instability noted.  Different treatment options were discussed today.  She declined a right knee joint cortisone injection at this time.  She declined a prescription for prednisone taper at this time.  According to the patient she has an upcoming eye surgery on Monday and would like to hold off on making any medication changes until after the surgery.  Plan on obtaining the following lab work today.  Discussed the importance of rest, ice, compression, elevation.  She can continue to use  the compression brace if helpful.  She was advised to notify us if her symptoms persist or worsen.  Plan: Sedimentation rate, Rheumatoid factor, Cyclic citrul peptide antibody, IgG,  C-reactive protein, Uric acid  Primary osteoarthritis of both knees - s/p synvisc bilateral knees 11/2022-12/2022.  She initially had improvement with viscosupplementation but has had a recurrence of pain in both knees especially the right knee since 02/08/2023.  No recent injury or fall.  Patient has been trying conservative measures including bracing, elevation, and topical agents.  Patient declined a cortisone injection or a prednisone taper at this time.  Plan to obtain the following lab work today.  She will notify us if her symptoms persist or worsen.  Chronic right shoulder pain - patient states she continues to have discomfort in her right shoulder joint since the injury was 2001.  She was under care of Dr. Cleophas Dunker in the past.  Trochanteric bursitis of both hips: Not currently symptomatic.  Plantar fasciitis, bilateral: Not currently symptomatic.  DDD (degenerative disc disease), cervical: Good range of motion of the C-spine noted on examination today.  Other medical conditions are listed as follows:  Vitreous floaters of right eye  Vitamin D deficiency  HTN (hypertension), benign: Blood pressure was 130/88 today in the office.  Fatty liver  Hypertensive left ventricular hypertrophy, without heart failure  Tubular adenoma of colon  Other insomnia  Hyperthyroidism  History of gastroesophageal reflux (GERD)    Orders: Orders Placed This Encounter  Procedures   Sedimentation rate   Rheumatoid factor   Cyclic citrul peptide antibody, IgG   C-reactive protein   Uric acid   ANA   C3 and C4   Anti-DNA antibody, double-stranded   CBC with Differential/Platelet   COMPLETE METABOLIC PANEL WITH GFR   No orders of the defined types were placed in this encounter.    Follow-Up Instructions: Return in about 6 months (around 09/16/2023) for Osteoarthritis, DDD.   Gearldine Bienenstock, PA-C  Note - This record has been created using Dragon software.  Chart creation errors  have been sought, but may not always  have been located. Such creation errors do not reflect on  the standard of medical care.

## 2023-03-08 ENCOUNTER — Ambulatory Visit: Payer: Medicare Other | Admitting: Internal Medicine

## 2023-03-10 ENCOUNTER — Ambulatory Visit (INDEPENDENT_AMBULATORY_CARE_PROVIDER_SITE_OTHER): Payer: Medicare Other | Admitting: Internal Medicine

## 2023-03-10 ENCOUNTER — Encounter: Payer: Self-pay | Admitting: Internal Medicine

## 2023-03-10 VITALS — BP 160/90 | HR 87 | Temp 98.0°F | Ht 64.0 in | Wt 174.0 lb

## 2023-03-10 DIAGNOSIS — I10 Essential (primary) hypertension: Secondary | ICD-10-CM

## 2023-03-10 DIAGNOSIS — E559 Vitamin D deficiency, unspecified: Secondary | ICD-10-CM | POA: Diagnosis not present

## 2023-03-10 DIAGNOSIS — E0590 Thyrotoxicosis, unspecified without thyrotoxic crisis or storm: Secondary | ICD-10-CM | POA: Diagnosis not present

## 2023-03-10 DIAGNOSIS — M25511 Pain in right shoulder: Secondary | ICD-10-CM | POA: Diagnosis not present

## 2023-03-10 DIAGNOSIS — F4321 Adjustment disorder with depressed mood: Secondary | ICD-10-CM

## 2023-03-10 DIAGNOSIS — R7989 Other specified abnormal findings of blood chemistry: Secondary | ICD-10-CM | POA: Diagnosis not present

## 2023-03-10 DIAGNOSIS — R030 Elevated blood-pressure reading, without diagnosis of hypertension: Secondary | ICD-10-CM

## 2023-03-10 DIAGNOSIS — G8929 Other chronic pain: Secondary | ICD-10-CM

## 2023-03-10 MED ORDER — LORAZEPAM 0.5 MG PO TABS: 0.5000 mg | ORAL_TABLET | Freq: Two times a day (BID) | ORAL | 2 refills | Status: DC | PRN

## 2023-03-10 MED ORDER — CYCLOBENZAPRINE HCL 5 MG PO TABS: 5.0000 mg | ORAL_TABLET | Freq: Three times a day (TID) | ORAL | 2 refills | Status: DC | PRN

## 2023-03-10 MED ORDER — DULOXETINE HCL 20 MG PO CPEP: 20.0000 mg | ORAL_CAPSULE | Freq: Every day | ORAL | 5 refills | Status: DC

## 2023-03-10 MED ORDER — GABAPENTIN 100 MG PO CAPS: ORAL_CAPSULE | ORAL | 3 refills | Status: DC

## 2023-03-10 MED ORDER — OXYCODONE-ACETAMINOPHEN 5-325 MG PO TABS: 1.0000 | ORAL_TABLET | Freq: Three times a day (TID) | ORAL | 0 refills | Status: DC | PRN

## 2023-03-10 NOTE — Assessment & Plan Note (Signed)
Cont w/Percocet prn - rare use  Potential benefits of a long term steroid  use as well as potential risks  and complications were explained to the patient and were aknowledged.

## 2023-03-10 NOTE — Assessment & Plan Note (Signed)
On B12 

## 2023-03-10 NOTE — Assessment & Plan Note (Signed)
On Vit D 

## 2023-03-10 NOTE — Assessment & Plan Note (Signed)
Check TSH, FT4 

## 2023-03-10 NOTE — Assessment & Plan Note (Signed)
Older sister just died at 54 in [place] from ESRD, clots, sepsis. Discussed

## 2023-03-10 NOTE — Assessment & Plan Note (Addendum)
Monitor BP at home NAS diet On Telmisartan HCT

## 2023-03-10 NOTE — Progress Notes (Signed)
Subjective:  Patient ID: Karina Newman, female    DOB: Aug 07, 1956  Age: 67 y.o. MRN: [id number]  CC: Follow-up (3 MNTH F/U)   HPI Karina Newman presents for chronic pain,   Grieving - sister just died at 21 in [place] from ESRD, clots, sepsis  Outpatient Medications Prior to Visit  Medication Sig Dispense Refill   acetaminophen (TYLENOL) 500 MG tablet Take 1,000 mg by mouth every 6 (six) hours as needed for moderate pain.     amLODipine (NORVASC) 5 MG tablet TAKE 1 TABLET (5 MG TOTAL) BY MOUTH DAILY. 90 tablet 2   azelastine (ASTELIN) 0.1 % nasal spray Place 1 spray into both nostrils 2 (two) times daily. Use in each nostril as directed 30 mL 5   carvedilol (COREG) 12.5 MG tablet TAKE 1 TABLET BY MOUTH 2 TIMES DAILY. 180 tablet 3   cetirizine (ZYRTEC ALLERGY) 10 MG tablet Take 1 tablet (10 mg total) by mouth daily. 30 tablet 5   Cholecalciferol 50 MCG (2000 UT) CAPS Take by mouth.     Cyanocobalamin (VITAMIN B-12) 500 MCG SUBL 1 sl qd 100 tablet 5   cyclobenzaprine (FLEXERIL) 5 MG tablet TAKE 1 TABLET BY MOUTH THREE TIMES A DAY AS NEEDED FOR MUSCLE SPASMS 60 tablet 2   cycloSPORINE (RESTASIS) 0.05 % ophthalmic emulsion Place 1 drop into both eyes 2 (two) times daily.     Diclofenac Sodium 1.5 % SOLN USE 15 DROPS THREE TIMES DAILY EXTERNALLY FOR PAIN 150 mL 5   dicyclomine (BENTYL) 10 MG capsule TAKE 1 CAPSULE (10 MG TOTAL) BY MOUTH EVERY 8 (EIGHT) HOURS AS NEEDED FOR SPASMS. 270 capsule 3   Difluprednate 0.05 % EMUL Apply 1 drop to eye 4 (four) times daily.     diphenhydrAMINE (BENADRYL) 25 MG tablet Take 25 mg by mouth every 6 (six) hours as needed for allergies.     doxazosin (CARDURA) 4 MG tablet TAKE 1 TABLET BY MOUTH EVERY DAY 90 tablet 1   DULoxetine (CYMBALTA) 20 MG capsule Take 1 capsule (20 mg total) by mouth daily. 90 capsule 5   fluticasone (FLONASE) 50 MCG/ACT nasal spray Place 2 sprays into both nostrils daily. 16 g 5   gabapentin (NEURONTIN) 100 MG capsule TAKE 1 CAPSULE BY  MOUTH THREE TIMES A DAY AS NEEDED 90 capsule 3   LORazepam (ATIVAN) 0.5 MG tablet TAKE 1 TABLET (0.5 MG TOTAL) BY MOUTH 2 (TWO) TIMES DAILY AS NEEDED FOR ANXIETY OR SLEEP. 60 tablet 2   omeprazole (PRILOSEC) 40 MG capsule TAKE 1 CAPSULE BY MOUTH EVERY DAY IN THE MORNING AND AT BEDTIME 180 capsule 3   oxyCODONE-acetaminophen (PERCOCET/ROXICET) 5-325 MG tablet Take 1 tablet by mouth every 8 (eight) hours as needed for severe pain. 90 tablet 0   Potassium Chloride ER 20 MEQ TBCR Take 1 tablet (20 mEq total) by mouth daily. 90 tablet 3   prednisoLONE acetate (PRED FORTE) 1 % ophthalmic suspension Place 1 drop into the right eye daily.     PROLENSA 0.07 % SOLN Apply to eye.     pseudoephedrine (SUDAFED) 120 MG 12 hr tablet Take by mouth.     sucralfate (CARAFATE) 1 g tablet TAKE 1 TABLET BY MOUTH 3 TIMES A DAY BETWEEN MEALS 270 tablet 3   SUMAtriptan (IMITREX) 100 MG tablet Take one prn migraine. May repeat in 2 hours if headache persists or recurs. 12 tablet 5   telmisartan-hydrochlorothiazide (MICARDIS HCT) 80-25 MG tablet Take 1 tablet by mouth daily. 90 tablet 3  traZODone (DESYREL) 50 MG tablet TAKE 1 TABLET BY MOUTH EVERYDAY AT BEDTIME 90 tablet 3   Vitamin D, Cholecalciferol, 1000 units CAPS Take 2,000 Units by mouth daily.     No facility-administered medications prior to visit.    ROS: Review of Systems  Constitutional:  Negative for activity change, appetite change, chills, fatigue and unexpected weight change.  HENT:  Negative for congestion, mouth sores and sinus pressure.   Eyes:  Negative for visual disturbance.  Respiratory:  Negative for cough and chest tightness.   Gastrointestinal:  Negative for abdominal pain and nausea.  Genitourinary:  Negative for difficulty urinating, frequency and vaginal pain.  Musculoskeletal:  Positive for arthralgias, back pain, neck pain and neck stiffness. Negative for gait problem.  Skin:  Negative for pallor and rash.  Neurological:  Negative  for dizziness, tremors, weakness, numbness and headaches.  Psychiatric/Behavioral:  Negative for confusion, sleep disturbance and suicidal ideas. The patient is not nervous/anxious.     Objective:  BP (!) 160/90 (BP Location: Left Arm, Patient Position: Sitting, Cuff Size: Normal)   Pulse 87   Temp 98 F (36.7 C) (Oral)   Ht 5\' 4"  (1.626 m)   Wt 174 lb (78.9 kg)   SpO2 98%   BMI 29.87 kg/m   BP Readings from Last 3 Encounters:  03/10/23 (!) 160/90  12/21/22 120/86  12/17/22 (!) 140/88    Wt Readings from Last 3 Encounters:  03/10/23 174 lb (78.9 kg)  12/21/22 173 lb 12.8 oz (78.8 kg)  12/17/22 173 lb 8 oz (78.7 kg)    Physical Exam Constitutional:      General: She is not in acute distress.    Appearance: She is well-developed. She is obese.  HENT:     Head: Normocephalic.     Right Ear: External ear normal.     Left Ear: External ear normal.     Nose: Nose normal.  Eyes:     General:        Right eye: No discharge.        Left eye: No discharge.     Conjunctiva/sclera: Conjunctivae normal.     Pupils: Pupils are equal, round, and reactive to light.  Neck:     Thyroid: No thyromegaly.     Vascular: No JVD.     Trachea: No tracheal deviation.  Cardiovascular:     Rate and Rhythm: Normal rate and regular rhythm.     Heart sounds: Normal heart sounds.  Pulmonary:     Effort: No respiratory distress.     Breath sounds: No stridor. No wheezing.  Abdominal:     General: Bowel sounds are normal. There is no distension.     Palpations: Abdomen is soft. There is no mass.     Tenderness: There is no abdominal tenderness. There is no guarding or rebound.  Musculoskeletal:        General: No tenderness.     Cervical back: Normal range of motion and neck supple. No rigidity.  Lymphadenopathy:     Cervical: No cervical adenopathy.  Skin:    Findings: No erythema or rash.  Neurological:     Cranial Nerves: No cranial nerve deficit.     Motor: No abnormal muscle tone.      Coordination: Coordination normal.     Deep Tendon Reflexes: Reflexes normal.  Psychiatric:        Behavior: Behavior normal.        Thought Content: Thought content normal.  Judgment: Judgment normal.     Lab Results  Component Value Date   WBC 5.2 03/01/2022   HGB 13.3 03/01/2022   HCT 39.9 03/01/2022   PLT 312.0 03/01/2022   GLUCOSE 90 04/28/2022   CHOL 161 12/04/2020   TRIG 101.0 12/04/2020   HDL 40.20 12/04/2020   LDLCALC 101 (H) 12/04/2020   ALT 39 (H) 03/01/2022   AST 28 03/01/2022   NA 141 04/28/2022   K 4.5 04/28/2022   CL 101 04/28/2022   CREATININE 0.88 04/28/2022   BUN 13 04/28/2022   CO2 22 04/28/2022   TSH 0.65 03/01/2022    No results found.  Assessment & Plan:   Problem List Items Addressed This Visit     Hyperthyroidism    Check TSH, FT4      HTN (hypertension), benign    Cont w/Amlodipine, Micardis HCT Monitor BP at home - nl BP per pt NAS diet Re-start meds      Vitamin D deficiency    On Vit D      Low vitamin B12 level    On B12      Pain in right shoulder    Cont w/Percocet prn - rare use  Potential benefits of a long term steroid  use as well as potential risks  and complications were explained to the patient and were aknowledged.      Grief - Primary     Older sister just died at 57 in [place] from ESRD, clots, sepsis. Discussed      Other Visit Diagnoses     Elevated BP without diagnosis of hypertension             No orders of the defined types were placed in this encounter.     Follow-up: Return in about 3 months (around 06/10/2023) for a follow-up visit.  Sonda Primes, MD

## 2023-03-10 NOTE — Patient Instructions (Signed)
You and Sherrill Raring can try [employer] Mushroom capsules for memory problems, decreased focus, mental fog, neuropathy ([email or web address])   Vtamin B complex

## 2023-03-10 NOTE — Assessment & Plan Note (Addendum)
Cont w/Amlodipine, Micardis HCT Monitor BP at home - nl BP per pt NAS diet Re-start meds

## 2023-03-16 ENCOUNTER — Ambulatory Visit: Payer: Medicare Other | Attending: Physician Assistant | Admitting: Physician Assistant

## 2023-03-16 ENCOUNTER — Encounter: Payer: Self-pay | Admitting: Physician Assistant

## 2023-03-16 VITALS — BP 130/88 | HR 101 | Resp 15 | Ht 64.0 in | Wt 173.8 lb

## 2023-03-16 DIAGNOSIS — M5030 Other cervical disc degeneration, unspecified cervical region: Secondary | ICD-10-CM | POA: Diagnosis not present

## 2023-03-16 DIAGNOSIS — G8929 Other chronic pain: Secondary | ICD-10-CM

## 2023-03-16 DIAGNOSIS — E559 Vitamin D deficiency, unspecified: Secondary | ICD-10-CM | POA: Diagnosis not present

## 2023-03-16 DIAGNOSIS — H43391 Other vitreous opacities, right eye: Secondary | ICD-10-CM | POA: Diagnosis not present

## 2023-03-16 DIAGNOSIS — M79641 Pain in right hand: Secondary | ICD-10-CM

## 2023-03-16 DIAGNOSIS — G4709 Other insomnia: Secondary | ICD-10-CM

## 2023-03-16 DIAGNOSIS — M170 Bilateral primary osteoarthritis of knee: Secondary | ICD-10-CM | POA: Diagnosis not present

## 2023-03-16 DIAGNOSIS — I10 Essential (primary) hypertension: Secondary | ICD-10-CM | POA: Diagnosis not present

## 2023-03-16 DIAGNOSIS — M25511 Pain in right shoulder: Secondary | ICD-10-CM

## 2023-03-16 DIAGNOSIS — M722 Plantar fascial fibromatosis: Secondary | ICD-10-CM

## 2023-03-16 DIAGNOSIS — D126 Benign neoplasm of colon, unspecified: Secondary | ICD-10-CM

## 2023-03-16 DIAGNOSIS — Z8719 Personal history of other diseases of the digestive system: Secondary | ICD-10-CM

## 2023-03-16 DIAGNOSIS — I119 Hypertensive heart disease without heart failure: Secondary | ICD-10-CM | POA: Diagnosis not present

## 2023-03-16 DIAGNOSIS — M79642 Pain in left hand: Secondary | ICD-10-CM

## 2023-03-16 DIAGNOSIS — E0590 Thyrotoxicosis, unspecified without thyrotoxic crisis or storm: Secondary | ICD-10-CM

## 2023-03-16 DIAGNOSIS — K760 Fatty (change of) liver, not elsewhere classified: Secondary | ICD-10-CM | POA: Diagnosis not present

## 2023-03-16 DIAGNOSIS — M25561 Pain in right knee: Secondary | ICD-10-CM | POA: Diagnosis not present

## 2023-03-16 DIAGNOSIS — M7062 Trochanteric bursitis, left hip: Secondary | ICD-10-CM

## 2023-03-16 DIAGNOSIS — M7061 Trochanteric bursitis, right hip: Secondary | ICD-10-CM | POA: Diagnosis not present

## 2023-03-16 DIAGNOSIS — R768 Other specified abnormal immunological findings in serum: Secondary | ICD-10-CM

## 2023-03-16 DIAGNOSIS — M25562 Pain in left knee: Secondary | ICD-10-CM

## 2023-03-16 NOTE — Progress Notes (Signed)
CBC WNL

## 2023-03-17 LAB — URIC ACID: Uric Acid, Serum: 4.6 mg/dL (ref 2.5–7.0)

## 2023-03-17 LAB — CBC WITH DIFFERENTIAL/PLATELET
Absolute Monocytes: 290 cells/uL (ref 200–950)
Basophils Absolute: 62 cells/uL (ref 0–200)
Basophils Relative: 1.4 %
Eosinophils Absolute: 62 cells/uL (ref 15–500)
Eosinophils Relative: 1.4 %
HCT: 42.7 % (ref 35.0–45.0)
Hemoglobin: 14.4 g/dL (ref 11.7–15.5)
Lymphs Abs: 1602 cells/uL (ref 850–3900)
MCH: 31.4 pg (ref 27.0–33.0)
MCHC: 33.7 g/dL (ref 32.0–36.0)
MCV: 93.2 fL (ref 80.0–100.0)
MPV: 10.1 fL (ref 7.5–12.5)
Monocytes Relative: 6.6 %
Neutro Abs: 2385 cells/uL (ref 1500–7800)
Neutrophils Relative %: 54.2 %
Platelets: 354 10*3/uL (ref 140–400)
RBC: 4.58 10*6/uL (ref 3.80–5.10)
RDW: 11.3 % (ref 11.0–15.0)
Total Lymphocyte: 36.4 %
WBC: 4.4 10*3/uL (ref 3.8–10.8)

## 2023-03-17 LAB — COMPLETE METABOLIC PANEL WITH GFR
AG Ratio: 1.2 (calc) (ref 1.0–2.5)
ALT: 49 U/L — ABNORMAL HIGH (ref 6–29)
AST: 31 U/L (ref 10–35)
Albumin: 4.3 g/dL (ref 3.6–5.1)
Alkaline phosphatase (APISO): 97 U/L (ref 37–153)
BUN: 10 mg/dL (ref 7–25)
CO2: 29 mmol/L (ref 20–32)
Calcium: 9.9 mg/dL (ref 8.6–10.4)
Chloride: 101 mmol/L (ref 98–110)
Creat: 0.87 mg/dL (ref 0.50–1.05)
Globulin: 3.5 g/dL (calc) (ref 1.9–3.7)
Glucose, Bld: 123 mg/dL — ABNORMAL HIGH (ref 65–99)
Potassium: 3.9 mmol/L (ref 3.5–5.3)
Sodium: 139 mmol/L (ref 135–146)
Total Bilirubin: 0.5 mg/dL (ref 0.2–1.2)
Total Protein: 7.8 g/dL (ref 6.1–8.1)
eGFR: 73 mL/min/{1.73_m2} (ref >=60)

## 2023-03-17 NOTE — Progress Notes (Signed)
Glucose is 123. ALT remains elevated-49. Please clarify if she is taking tylenol or alcohol use? Rest of CMP WNL.  Uric acid WNL RF negative  ESR WNL Complements WNl

## 2023-03-17 NOTE — Progress Notes (Signed)
CRP WNL

## 2023-03-18 ENCOUNTER — Other Ambulatory Visit: Payer: Self-pay

## 2023-03-18 ENCOUNTER — Ambulatory Visit: Payer: Medicare Other | Admitting: Internal Medicine

## 2023-03-18 ENCOUNTER — Encounter: Payer: Self-pay | Admitting: *Deleted

## 2023-03-18 ENCOUNTER — Encounter: Payer: Self-pay | Admitting: Internal Medicine

## 2023-03-18 VITALS — BP 124/78 | HR 72 | Temp 98.0°F | Wt 173.5 lb

## 2023-03-18 DIAGNOSIS — J3089 Other allergic rhinitis: Secondary | ICD-10-CM | POA: Diagnosis not present

## 2023-03-18 DIAGNOSIS — Z888 Allergy status to other drugs, medicaments and biological substances status: Secondary | ICD-10-CM

## 2023-03-18 DIAGNOSIS — T887XXA Unspecified adverse effect of drug or medicament, initial encounter: Secondary | ICD-10-CM

## 2023-03-18 DIAGNOSIS — L2389 Allergic contact dermatitis due to other agents: Secondary | ICD-10-CM

## 2023-03-18 DIAGNOSIS — K219 Gastro-esophageal reflux disease without esophagitis: Secondary | ICD-10-CM | POA: Diagnosis not present

## 2023-03-18 MED ORDER — IPRATROPIUM BROMIDE 0.06 % NA SOLN: 1.0000 | Freq: Three times a day (TID) | NASAL | 5 refills | Status: DC | PRN

## 2023-03-18 MED ORDER — CETIRIZINE HCL 10 MG PO TABS: 10.0000 mg | ORAL_TABLET | Freq: Every day | ORAL | 5 refills | Status: DC

## 2023-03-18 MED ORDER — FLUTICASONE PROPIONATE 50 MCG/ACT NA SUSP: 2.0000 | Freq: Every day | NASAL | 5 refills | Status: DC

## 2023-03-18 NOTE — Patient Instructions (Addendum)
Allergic Rhinitis: - Positive skin test 12/2022: mold, cat, cockroach.  - Avoidance measures discussed. - Use nasal saline rinses before nose sprays such as with Neilmed Sinus Rinse.  Use distilled water.   - Use Flonase 2 sprays each nostril daily. Aim upward and outward. - Use Ipratroprium 1-2 sprays up to three times daily as needed for runny nose or drainage. Aim upward and outward. - Use Zyrtec 10 mg daily.  - Consider allergy shots as long term control of your symptoms by teaching your immune system to be more tolerant of your allergy triggers  Dermatitis - Mostly around nose.  Can do patch testing if persistent. - Apply a gentle, unscented moisturizer cream or ointment (Cerave, Cetaphil, Eucerin, Aveeno)   - Apply prescribed topical steroid hydrocortisone 1% to flared areas  Taper off the topical steroids as the skin improves. Do not use topical steroid for more than 7 days at a time.   GERD -Continue omperazole 40mg  daily.  -Avoid lying down for at least two hours after a meal or after drinking acidic beverages, like soda, or other caffeinated beverages. This can help to prevent stomach contents from flowing back into the esophagus. -Keep your head elevated while you sleep. Using an extra pillow or two can also help to prevent reflux. -Eat smaller and more frequent meals each day instead of a few large meals. This promotes digestion and can aid in preventing heartburn. -Wear loose-fitting clothes to ease pressure on the stomach, which can worsen heartburn and reflux. -Reduce excess weight around the midsection. This can ease pressure on the stomach. Such pressure can force some stomach contents back up the esophagus.

## 2023-03-18 NOTE — Progress Notes (Signed)
FOLLOW UP Date of Service/Encounter:  03/18/23   Subjective:  Karina Newman (DOB: [birth year]) is a 67 y.o. female who [facility] for follow up for allergic rhinitis and GERD.   History obtained from: chart review and patient. Last visit was on 12/21/2022 and was SPT positive to perennial allergens.  Discussed starting Flonase, Azelastine, Zyrtec.  Also on Omeprazole.   Rhinoconjunctivitis: Reports was doing better but then she went to her daugther's house in [place] from June to July who had cats and noticed worsening congestion, runny nose, hoarseness.  It is slowly getting better now.  Taking Flonase and Zyrtec daily.  Tried Azelastine but causing numbness in her nose so she stopped it.   GERD: Doing well as long as she takes the Omeprazole.  Sometimes does have heartburn if she eats spicy foods.    Dermatitis: Reports a rash around nose that breaks out randomly.  It is itchy, dry and red. It is infrequent.  Usually uses hydrocortisone over the counter and it helps.  Does not moisturize.   Past Medical History: Past Medical History:  Diagnosis Date   Abnormal cervical Pap smear with positive HPV DNA test 01/2014,01/2017   2015 Normal cytology with positive high-risk HPV 18/45. Colposcopy negative with negative ECC.  2018 normal cytology with positive high-risk HPV 18/45   Anemia    Apnea 05/17/2018   Autoimmune gastritis    Fatty liver    Gastritis    Heart murmur    Hypertension    Hyperthyroidism    Inappropriate sinus tachycardia 05/17/2018   Internal hemorrhoids    Osteoarthritis    Right shoulder pain 2001   chronic pain   Thyroid disease    Hyperthyroid. Was on PTU (Dr Willaim Bane in [place]) - stopped in 2013, stable   Tubular adenoma of colon     Objective:  BP 124/78   Pulse 72   Temp 98 F (36.7 C) (Temporal)   Wt 173 lb 8 oz (78.7 kg)   SpO2 97%   BMI 29.78 kg/m  Body mass index is 29.78 kg/m. Physical Exam: GEN: alert, well  developed HEENT: clear conjunctiva, TM grey and translucent, nose with mild inferior turbinate hypertrophy, pink nasal mucosa, clear rhinorrhea, + cobblestoning HEART: regular rate and rhythm, no murmur LUNGS: clear to auscultation bilaterally, no coughing, unlabored respiration SKIN: no rashes or lesions  Assessment:   1. Perennial allergic rhinitis   2. Gastroesophageal reflux disease without esophagitis   3. Allergic contact dermatitis due to other agents   4. Side effect of medication     Plan/Recommendations:  Allergic Rhinitis: - Uncontrolled, discussed Ipratropium and consider AIT.  Will stop Azelastine due to nasal numbness.  - Positive skin test 12/2022: mold, cat, cockroach.  - Avoidance measures discussed. - Use nasal saline rinses before nose sprays such as with Neilmed Sinus Rinse.  Use distilled water.   - Use Flonase 2 sprays each nostril daily. Aim upward and outward. - Use Ipratroprium 1-2 sprays up to three times daily as needed for runny nose or drainage. Aim upward and outward. - Use Zyrtec 10 mg daily.  - Consider allergy shots as long term control of your symptoms by teaching your immune system to be more tolerant of your allergy triggers  Dermatitis - Mostly around nose.  Can do patch testing if persistent. - Apply a gentle, unscented moisturizer cream or ointment (Cerave, Cetaphil, Eucerin, Aveeno)   - Apply prescribed topical steroid hydrocortisone  1% to flared areas  Taper off the topical steroids as the skin improves. Do not use topical steroid for more than 7 days at a time.   GERD - Controlled  -Continue omperazole 40mg  daily.  -Avoid lying down for at least two hours after a meal or after drinking acidic beverages, like soda, or other caffeinated beverages. This can help to prevent stomach contents from flowing back into the esophagus. -Keep your head elevated while you sleep. Using an extra pillow or two can also help to prevent reflux. -Eat smaller  and more frequent meals each day instead of a few large meals. This promotes digestion and can aid in preventing heartburn. -Wear loose-fitting clothes to ease pressure on the stomach, which can worsen heartburn and reflux. -Reduce excess weight around the midsection. This can ease pressure on the stomach. Such pressure can force some stomach contents back up the esophagus.     Return in about 6 months (around 09/18/2023).  Karina Morin, MD [facility]

## 2023-03-18 NOTE — Progress Notes (Signed)
ANA negative.  Anti-CCP pending

## 2023-03-18 NOTE — Progress Notes (Signed)
She should try to avoid tylenol use due to elevated ALT.  Ok to discuss the use of natural antiinflammatories

## 2023-03-19 NOTE — Progress Notes (Signed)
Anti-CCP negative.

## 2023-03-21 DIAGNOSIS — H43392 Other vitreous opacities, left eye: Secondary | ICD-10-CM | POA: Diagnosis not present

## 2023-03-21 DIAGNOSIS — H43311 Vitreous membranes and strands, right eye: Secondary | ICD-10-CM | POA: Diagnosis not present

## 2023-03-21 DIAGNOSIS — H3581 Retinal edema: Secondary | ICD-10-CM | POA: Diagnosis not present

## 2023-03-21 DIAGNOSIS — H43391 Other vitreous opacities, right eye: Secondary | ICD-10-CM | POA: Diagnosis not present

## 2023-03-21 HISTORY — PX: OTHER SURGICAL HISTORY: SHX169

## 2023-03-29 ENCOUNTER — Other Ambulatory Visit: Payer: Self-pay

## 2023-03-29 DIAGNOSIS — H43391 Other vitreous opacities, right eye: Secondary | ICD-10-CM | POA: Diagnosis not present

## 2023-03-29 MED ORDER — PREDNISONE 5 MG PO TABS: ORAL_TABLET | ORAL | 0 refills | Status: DC

## 2023-03-29 NOTE — Telephone Encounter (Signed)
Patient contacted the office and states at her last appointment she was given the option to get injections for her pain or prednisone taper. Patient states at the appointment she said she would wait to choose until after her eye surgery. Patient states she is now calling back and that she would like prednisone taper for her pain. Patient states she would like the prescription sent to [facility] on [address]. Patient call back number is [phone]. Please advise.

## 2023-03-29 NOTE — Telephone Encounter (Signed)
Patient advised prescription for a prednisone taper to be sent to the pharmacy. Patient advised to take prednisone in the morning with food and avoid the use of NSAIDs.

## 2023-03-29 NOTE — Telephone Encounter (Signed)
Ok to send in prednisone taper starting at 20 mg tapering by 5 mg every 4 days.  Take prednisone in the morning with food and avoid the use of NSAIDs.

## 2023-04-13 ENCOUNTER — Other Ambulatory Visit: Payer: Self-pay | Admitting: Internal Medicine

## 2023-04-19 DIAGNOSIS — H43392 Other vitreous opacities, left eye: Secondary | ICD-10-CM | POA: Diagnosis not present

## 2023-04-21 ENCOUNTER — Encounter: Payer: Self-pay | Admitting: Gastroenterology

## 2023-04-21 DIAGNOSIS — H43392 Other vitreous opacities, left eye: Secondary | ICD-10-CM | POA: Diagnosis not present

## 2023-04-21 DIAGNOSIS — H33312 Horseshoe tear of retina without detachment, left eye: Secondary | ICD-10-CM | POA: Diagnosis not present

## 2023-04-29 DIAGNOSIS — H43392 Other vitreous opacities, left eye: Secondary | ICD-10-CM | POA: Diagnosis not present

## 2023-05-19 NOTE — Progress Notes (Signed)
Office Visit Note  Patient: Karina Newman             Date of Birth: 08/30/1955           MRN: [id number]             PCP: Tresa Garter, MD Referring: Tresa Garter, MD Visit Date: 06/01/2023 Occupation: @GUAROCC @  Subjective:  Pain in multiple joints  History of Present Illness: Karina Newman is a 67 y.o. female with osteoarthritis and degenerative disc disease.  She states she hurt her right shoulder somehow and has been having increased pain and discomfort.  She was evaluated Ortho care.  She has a MRI of her right shoulder joint pending.  She continues to have pain and discomfort in her right hand.  She has not noticed any swelling.  Has a stiffness in her bilateral hands.  She states that her knee joint discomfort has improved but not completely resolved.  She had viscosupplement injection to both knees in May 2024.  She states after that she was given a prednisone taper which helped the discomfort.  She continues to have some discomfort in her feet.    Activities of Daily Living:  Patient reports morning stiffness for 10-15 minutes.   Patient Reports nocturnal pain.  Difficulty dressing/grooming: Denies Difficulty climbing stairs: Denies Difficulty getting out of chair: Denies Difficulty using hands for taps, buttons, cutlery, and/or writing: Reports  Review of Systems  Constitutional:  Negative for fatigue.  HENT:  Positive for mouth dryness. Negative for mouth sores.   Eyes:  Positive for dryness.  Respiratory:  Negative for shortness of breath.   Cardiovascular:  Positive for palpitations. Negative for chest pain.  Gastrointestinal:  Negative for blood in stool, constipation and diarrhea.  Endocrine: Negative for increased urination.  Genitourinary:  Negative for involuntary urination.  Musculoskeletal:  Positive for joint pain, joint pain, myalgias, morning stiffness, muscle tenderness and myalgias. Negative for gait problem, joint swelling and muscle  weakness.  Skin:  Negative for color change, rash, hair loss and sensitivity to sunlight.  Allergic/Immunologic: Negative for susceptible to infections.  Neurological:  Positive for headaches. Negative for dizziness.  Hematological:  Negative for swollen glands.  Psychiatric/Behavioral:  Positive for sleep disturbance. Negative for depressed mood. The patient is nervous/anxious.     PMFS History:  Patient Active Problem List   Diagnosis Date Noted   Nasal congestion 11/02/2022   Laryngopharyngeal reflux 10/13/2022   Gastritis 03/01/2022   Toe pain, chronic, right 09/17/2020   Grief 07/24/2020   Dysphagia 01/02/2020   Globus sensation 01/02/2020   Thoracic back pain 12/19/2019   Sore throat 10/24/2019   Goiter 10/24/2019   Cough 10/24/2019   Neoplasm of uncertain behavior of skin 09/27/2019   Pain of right breast 06/03/2019   Fatty liver 01/23/2019   Hand pain 09/28/2018   Knee pain 09/28/2018   Inappropriate sinus tachycardia (HCC) 05/17/2018   Apnea 05/17/2018   Upper respiratory infection 09/23/2017   Fatigue 09/12/2017   Well adult exam 01/19/2017   Abdominal pain 01/19/2017   Headache 10/27/2016   Pain in right shoulder 09/16/2016   Left hip pain 09/16/2016   Low vitamin B12 level 03/16/2016   Piriformis syndrome of right side 02/04/2016   IT band syndrome 02/04/2016   RUQ abdominal pain 02/04/2016   Dysfunction of both eustachian tubes 12/11/2015   Hoarseness 12/11/2015   Acute serous otitis media of left ear 10/08/2015   Low back pain radiating to right lower  extremity 09/19/2015   Allergic rhinitis 09/19/2015   Atypical chest pain 04/07/2015   GERD (gastroesophageal reflux disease) 04/07/2015   Axillary adenitis 03/23/2015   Migraines 03/23/2015   Hives 01/29/2015   Edema 01/29/2015   Cervical disc disorder with radiculopathy of cervical region 12/11/2014   Insomnia 07/31/2014   Degenerative cervical disc 05/08/2014   Pain in joint, shoulder region  03/05/2014   Patellofemoral arthralgia of both knees 03/01/2014   Bilateral shoulder pain 10/18/2013   Sinusitis, bacterial 08/20/2013   URI, acute 08/14/2013   Hemoptysis 08/14/2013   Otitis media of left ear 08/14/2013   Labral tear of shoulder 07/03/2013   Hyperthyroidism 07/03/2013   HTN (hypertension), benign 07/03/2013   LVH (left ventricular hypertrophy) due to hypertensive disease 07/03/2013   Vitamin D deficiency 07/03/2013    Past Medical History:  Diagnosis Date   Abnormal cervical Pap smear with positive HPV DNA test 01/2014,01/2017   2015 Normal cytology with positive high-risk HPV 18/45. Colposcopy negative with negative ECC.  2018 normal cytology with positive high-risk HPV 18/45   Anemia    Apnea 05/17/2018   Autoimmune gastritis    Fatty liver    Gastritis    Heart murmur    Hypertension    Hyperthyroidism    Inappropriate sinus tachycardia (HCC) 05/17/2018   Internal hemorrhoids    Osteoarthritis    Right shoulder pain 2001   chronic pain   Thyroid disease    Hyperthyroid. Was on PTU (Dr Willaim Bane in [place]) - stopped in 2013, stable   Tubular adenoma of colon     Family History  Problem Relation Age of Onset   Kidney disease Mother        ESRD   Hypertension Mother    Lung cancer Father    Hypertension Sister    Kidney disease Sister    Hypertension Sister    Diabetes Brother    Hypertension Brother    Hypertension Brother    Diabetes Paternal Grandmother    Sleep apnea Son    Hypertension Son    Colon cancer Neg Hx    Stomach cancer Neg Hx    Esophageal cancer Neg Hx    Rectal cancer Neg Hx    Pancreatic cancer Neg Hx    Past Surgical History:  Procedure Laterality Date   BIOPSY  03/11/2022   Procedure: BIOPSY;  Surgeon: Meridee Score, Netty Starring., MD;  Location: [facility] ENDOSCOPY;  Service: Gastroenterology;;   BREAST CYST ASPIRATION Left 2012   CATARACT EXTRACTION Right 06/15/2022   CATARACT EXTRACTION Left 06/28/2022   CESAREAN SECTION     x 4     COLONOSCOPY     ESOPHAGOGASTRODUODENOSCOPY (EGD) WITH PROPOFOL N/A 03/11/2022   Procedure: ESOPHAGOGASTRODUODENOSCOPY (EGD) WITH PROPOFOL;  Surgeon: Lemar Lofty., MD;  Location: [facility] ENDOSCOPY;  Service: Gastroenterology;  Laterality: N/A;   EUS N/A 03/11/2022   Procedure: UPPER ENDOSCOPIC ULTRASOUND (EUS) RADIAL;  Surgeon: Lemar Lofty., MD;  Location: [facility] ENDOSCOPY;  Service: Gastroenterology;  Laterality: N/A;   HAND SURGERY Left    SHOULDER SURGERY Right 2002   SHOULDER SURGERY Left 2014   TUBAL LIGATION     UPPER GASTROINTESTINAL ENDOSCOPY     Social History   Social History Narrative   Not on file   Immunization History  Administered Date(s) Administered   Tdap 10/17/2021   Zoster Recombinant(Shingrix) 03/25/2017, 06/27/2017     Objective: Vital Signs: BP (!) 154/77 (BP Location: Left Arm, Patient Position: Sitting, Cuff Size: Normal)  Pulse 68   Ht 5\' 4"  (1.626 m)   Wt 176 lb (79.8 kg)   BMI 30.21 kg/m    Physical Exam Vitals and nursing note reviewed.  Constitutional:      Appearance: She is well-developed.  HENT:     Head: Normocephalic and atraumatic.  Eyes:     Conjunctiva/sclera: Conjunctivae normal.  Cardiovascular:     Rate and Rhythm: Normal rate and regular rhythm.     Heart sounds: Normal heart sounds.  Pulmonary:     Effort: Pulmonary effort is normal.     Breath sounds: Normal breath sounds.  Abdominal:     General: Bowel sounds are normal.     Palpations: Abdomen is soft.  Musculoskeletal:     Cervical back: Normal range of motion.  Lymphadenopathy:     Cervical: No cervical adenopathy.  Skin:    General: Skin is warm and dry.     Capillary Refill: Capillary refill takes less than 2 seconds.  Neurological:     Mental Status: She is alert and oriented to person, place, and time.  Psychiatric:        Behavior: Behavior normal.      Musculoskeletal Exam: Cervical, thoracic and lumbar spine were in good range of motion.   She had shoulder joint abduction limited to 100 degrees which was painful.  She had very limited internal rotation.  Left shoulder joint was in full range of motion.  Elbow joints, wrist joints, MCPs PIPs and DIPs with good range of motion with no synovitis.  Hip joints and knee joints were in good range of motion without any warmth swelling or effusion.  There was no tenderness over ankles or MTPs.  CDAI Exam: CDAI Score: -- Patient Global: --; Provider Global: -- Swollen: --; Tender: -- Joint Exam 06/01/2023   No joint exam has been documented for this visit   There is currently no information documented on the homunculus. Go to the Rheumatology activity and complete the homunculus joint exam.  Investigation: No additional findings.  Imaging: No results found.  Recent Labs: Lab Results  Component Value Date   WBC 4.4 03/16/2023   HGB 14.4 03/16/2023   PLT 354 03/16/2023   NA 139 03/16/2023   K 3.9 03/16/2023   CL 101 03/16/2023   CO2 29 03/16/2023   GLUCOSE 123 (H) 03/16/2023   BUN 10 03/16/2023   CREATININE 0.87 03/16/2023   BILITOT 0.5 03/16/2023   ALKPHOS 98 03/01/2022   AST 31 03/16/2023   ALT 49 (H) 03/16/2023   PROT 7.8 03/16/2023   ALBUMIN 4.5 03/01/2022   CALCIUM 9.9 03/16/2023   GFRAA 78 06/20/2018    Speciality Comments: No specialty comments available.  Procedures:  No procedures performed Allergies: Penicillins, Tramadol, Iodine, Lexapro [escitalopram], Lyrica [pregabalin], Nsaids, and Voltaren [diclofenac]   Assessment / Plan:     Visit Diagnoses: Positive ANA (antinuclear antibody) - ANA 1:40 cytoplasmic 10/21/21.  Repeat ANA was negative on July 30 was 2024.  Double-stranded ENA negative, complements normal.  Sed rate was normal.  Lab results were discussed with the patient.  No further testing is needed  Pain in both hands -she continues to have some pain and stiffness in her hands.  No synovitis was noted.  X-rays of both hands were consistent with  osteoarthritis on 10/21/2021.  A handout on hand muscle strengthening exercise was given.  She should protection was discussed.  Chronic pain of both knees -she continues to have knee joint discomfort.  She states the knee joint discomfort improved after the prednisone taper.  X-rays 11/15/2022 consistent with moderate osteoarthritis moderate chondromalacia patella.No radiographic progression was noted when compared to x-rays from 2021.  Primary osteoarthritis of both knees - s/p synvisc bilateral knees 11/2022-12/2022.  Patient will notify us when she needs to repeat Visco supplement injections.  Chronic right shoulder pain -she has been having increased pain and discomfort in her right shoulder.  She states MRI of the right shoulder joint is pending.  She was evaluated by orthopedics.  Patient states she continues to have discomfort in her right shoulder joint since the injury was 2001.  She was under care of Dr. Cleophas Dunker in the past.  Trochanteric bursitis of both hips-she has intermittent discomfort in the trochanteric bursa.  IT band stretches were discussed.  Plantar fasciitis, bilateral-recent recurrence.  DDD (degenerative disc disease), cervical-she continues to have neck stiffness and discomfort.  Range of motion exercises were discussed.  Other medical problems are listed as follows:  Vitreous floaters of right eye  Vitamin D deficiency  HTN (hypertension), benign-blood pressure was elevated at 156/84.  Repeat blood pressure was 154/77.  Patient states the elevated blood pressure is related to her pain.  She was advised to monitor blood pressure closely.  Fatty liver-her LFTs remain elevated.  ALT was 49 on March 16, 2023.  Weight loss diet and exercise was discussed.  I also advised her to follow-up with her PCP.  Hypertensive left ventricular hypertrophy, without heart failure  Other insomnia  Tubular adenoma of colon  Hyperthyroidism  History of gastroesophageal reflux  (GERD)  Orders: No orders of the defined types were placed in this encounter.  No orders of the defined types were placed in this encounter.    Follow-Up Instructions: Return in about 6 months (around 11/30/2023) for Osteoarthritis.   Pollyann Savoy, MD  Note - This record has been created using Animal nutritionist.  Chart creation errors have been sought, but may not always  have been located. Such creation errors do not reflect on  the standard of medical care.

## 2023-05-20 DIAGNOSIS — H43392 Other vitreous opacities, left eye: Secondary | ICD-10-CM | POA: Diagnosis not present

## 2023-05-20 DIAGNOSIS — H31012 Macula scars of posterior pole (postinflammatory) (post-traumatic), left eye: Secondary | ICD-10-CM | POA: Diagnosis not present

## 2023-05-23 ENCOUNTER — Encounter: Payer: Self-pay | Admitting: Internal Medicine

## 2023-05-24 ENCOUNTER — Encounter: Payer: Self-pay | Admitting: Physician Assistant

## 2023-05-24 ENCOUNTER — Ambulatory Visit (INDEPENDENT_AMBULATORY_CARE_PROVIDER_SITE_OTHER): Admitting: Physician Assistant

## 2023-05-24 DIAGNOSIS — G8929 Other chronic pain: Secondary | ICD-10-CM

## 2023-05-24 DIAGNOSIS — M25511 Pain in right shoulder: Secondary | ICD-10-CM

## 2023-05-24 NOTE — Progress Notes (Signed)
Office Visit Note   Patient: Karina Newman           Date of Birth: 26-Dec-1955           MRN: [id number] Visit Date: 05/24/2023              Requested by: Tresa Garter, MD [address] [place],  [place] [address] PCP: Tresa Garter, MD  Chief Complaint  Patient presents with   Right Shoulder - Pain      HPI: Karina Newman is a 67 year old woman who I have seen in the past.  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  This was in [place].  She did have surgery which from her description sounds like it may have been a rotator cuff injury.  She saw me in January and is seeing Dr. Cleophas Newman previously.  She has had complaints of both neck pain and right shoulder pain.  She does go elsewhere and has had epidural steroid injections into her neck.  She comes in today complaining more of right shoulder pain.  She cannot sleep on it at night it hurts her with range of motion.  I did recommend an MRI in January but she said she did not get this because she was not feeling well.  She is not new injury.  She describes the pain as moderate to severe.  She is also in chronic pain management.  She does have some radicular findings with some numbness in her hand but not reproducible with her neck right now and this is being followed by a neurologist per her report.  She said she has had several shoulder injections which have been minimally helpful.   Assessment & Plan: Visit Diagnoses:  1. Chronic right shoulder pain     Plan: 67 year old woman with work-related injuries to her right shoulder and neck.  She describes what I believed to be a rotator cuff procedure  She does have some radicular findings with some numbness in her hand but not reproducible with her neck right now and this is being followed by a neurologist per her report.  She said she has had several shoulder injections which have been minimally helpful.  Had epidural  injections which helped some of her shoulder symptoms but not recently been helpful.  I think she has 2 contributing factors but today the shoulder seems to be overwhelmingly bothers her more.  She has positive impingement findings and limited range of motion secondary to pain.  Does not seem to have any reproducible pain with range of motion of her neck.  I would like her to go forward and get the MRI.  Because of her longstanding right shoulder pain her history of surgery on the shoulder I would like her to follow-up with Dr. August Newman once this is completed  Follow-Up Instructions: With Dr. August Newman after MRI  Ortho Exam  Patient is alert, oriented, no adenopathy, well-dressed, normal affect, normal respiratory effort. Examination of her right shoulder she is got neurovascularly intact distally has some altered sensation in her hand but hand is warm with brisk capillary refill.  Range of motion of her neck is painless.  She has active forward elevation of her arm to about 150 degrees and then gets quite painful.  Resist doing internal rotation behind the back because of the pain.  Her strength is overall good equivalent to the other side only again limited by some  pain.  She has actually good external rotation without much pain.  She has positive empty can test and positive speeds test.  Good biceps and triceps strength  Imaging: No results found. No images are attached to the encounter.  Labs: Lab Results  Component Value Date   ESRSEDRATE 28 03/16/2023   ESRSEDRATE 19 10/21/2021   ESRSEDRATE 22 09/12/2017   CRP 5.1 03/16/2023   CRP 3.1 10/21/2021   LABURIC 4.6 03/16/2023   LABURIC 5.3 10/24/2018     Lab Results  Component Value Date   ALBUMIN 4.5 03/01/2022   ALBUMIN 4.4 07/24/2021   ALBUMIN 4.1 12/04/2020    No results found for: "MG" Lab Results  Component Value Date   VD25OH 76.73 12/04/2020   VD25OH 49.39 09/12/2017   VD25OH 21.13 (L) 10/16/2015    No results found for:  "PREALBUMIN"    Latest Ref Rng & Units 03/16/2023    9:55 AM 03/01/2022    1:59 PM 07/24/2021    2:29 PM  CBC EXTENDED  WBC 3.8 - 10.8 Thousand/uL 4.4  5.2  6.0   RBC 3.80 - 5.10 Million/uL 4.58  4.15  4.16   Hemoglobin 11.7 - 15.5 g/dL 13.2  44.0  10.2   HCT 35.0 - 45.0 % 42.7  39.9  38.9   Platelets 140 - 400 Thousand/uL 354  312.0  326   NEUT# 1,500 - 7,800 cells/uL 2,385  2.9    Lymph# 850 - 3,900 cells/uL 1,602  1.8       There is no height or weight on file to calculate BMI.  Orders:  Orders Placed This Encounter  Procedures   MR SHOULDER RIGHT WO CONTRAST   Ambulatory referral to Orthopedic Surgery   No orders of the defined types were placed in this encounter.    Procedures: No procedures performed  Clinical Data: No additional findings.  ROS:  All other systems negative, except as noted in the HPI. Review of Systems  Objective: Vital Signs: There were no vitals taken for this visit.  Specialty Comments:  No specialty comments available.  PMFS History: Patient Active Problem List   Diagnosis Date Noted   Nasal congestion 11/02/2022   Laryngopharyngeal reflux 10/13/2022   Gastritis 03/01/2022   Toe pain, chronic, right 09/17/2020   Grief 07/24/2020   Dysphagia 01/02/2020   Globus sensation 01/02/2020   Thoracic back pain 12/19/2019   Sore throat 10/24/2019   Goiter 10/24/2019   Cough 10/24/2019   Neoplasm of uncertain behavior of skin 09/27/2019   Pain of right breast 06/03/2019   Fatty liver 01/23/2019   Hand pain 09/28/2018   Knee pain 09/28/2018   Inappropriate sinus tachycardia (HCC) 05/17/2018   Apnea 05/17/2018   Upper respiratory infection 09/23/2017   Fatigue 09/12/2017   Well adult exam 01/19/2017   Abdominal pain 01/19/2017   Headache 10/27/2016   Pain in right shoulder 09/16/2016   Left hip pain 09/16/2016   Low vitamin B12 level 03/16/2016   Piriformis syndrome of right side 02/04/2016   IT band syndrome 02/04/2016   RUQ  abdominal pain 02/04/2016   Dysfunction of both eustachian tubes 12/11/2015   Hoarseness 12/11/2015   Acute serous otitis media of left ear 10/08/2015   Low back pain radiating to right lower extremity 09/19/2015   Allergic rhinitis 09/19/2015   Atypical chest pain 04/07/2015   GERD (gastroesophageal reflux disease) 04/07/2015   Axillary adenitis 03/23/2015   Migraines 03/23/2015   Hives 01/29/2015   Edema 01/29/2015  Cervical disc disorder with radiculopathy of cervical region 12/11/2014   Insomnia 07/31/2014   Degenerative cervical disc 05/08/2014   Pain in joint, shoulder region 03/05/2014   Patellofemoral arthralgia of both knees 03/01/2014   Bilateral shoulder pain 10/18/2013   Sinusitis, bacterial 08/20/2013   URI, acute 08/14/2013   Hemoptysis 08/14/2013   Otitis media of left ear 08/14/2013   Labral tear of shoulder 07/03/2013   Hyperthyroidism 07/03/2013   HTN (hypertension), benign 07/03/2013   LVH (left ventricular hypertrophy) due to hypertensive disease 07/03/2013   Vitamin D deficiency 07/03/2013   Past Medical History:  Diagnosis Date   Abnormal cervical Pap smear with positive HPV DNA test 01/2014,01/2017   2015 Normal cytology with positive high-risk HPV 18/45. Colposcopy negative with negative ECC.  2018 normal cytology with positive high-risk HPV 18/45   Anemia    Apnea 05/17/2018   Autoimmune gastritis    Fatty liver    Gastritis    Heart murmur    Hypertension    Hyperthyroidism    Inappropriate sinus tachycardia 05/17/2018   Internal hemorrhoids    Osteoarthritis    Right shoulder pain 2001   chronic pain   Thyroid disease    Hyperthyroid. Was on PTU (Dr Willaim Bane in [place]) - stopped in 2013, stable   Tubular adenoma of colon     Family History  Problem Relation Age of Onset   Kidney disease Mother        ESRD   Hypertension Mother    Lung cancer Father    Hypertension Sister    Kidney disease Sister    Hypertension Sister    Diabetes Brother     Hypertension Brother    Hypertension Brother    Diabetes Paternal Grandmother    Sleep apnea Son    Hypertension Son    Colon cancer Neg Hx    Stomach cancer Neg Hx    Esophageal cancer Neg Hx    Rectal cancer Neg Hx    Pancreatic cancer Neg Hx     Past Surgical History:  Procedure Laterality Date   BIOPSY  03/11/2022   Procedure: BIOPSY;  Surgeon: Meridee Score, Netty Starring., MD;  Location: [facility] ENDOSCOPY;  Service: Gastroenterology;;   BREAST CYST ASPIRATION Left 2012   CATARACT EXTRACTION Right 06/15/2022   CATARACT EXTRACTION Left 06/28/2022   CESAREAN SECTION     x 4    COLONOSCOPY     ESOPHAGOGASTRODUODENOSCOPY (EGD) WITH PROPOFOL N/A 03/11/2022   Procedure: ESOPHAGOGASTRODUODENOSCOPY (EGD) WITH PROPOFOL;  Surgeon: Lemar Lofty., MD;  Location: [facility] ENDOSCOPY;  Service: Gastroenterology;  Laterality: N/A;   EUS N/A 03/11/2022   Procedure: UPPER ENDOSCOPIC ULTRASOUND (EUS) RADIAL;  Surgeon: Lemar Lofty., MD;  Location: [facility] ENDOSCOPY;  Service: Gastroenterology;  Laterality: N/A;   HAND SURGERY Left    SHOULDER SURGERY Right 2002   SHOULDER SURGERY Left 2014   TUBAL LIGATION     UPPER GASTROINTESTINAL ENDOSCOPY     Social History   Occupational History   Occupation: RETIRED  Tobacco Use   Smoking status: Never    Passive exposure: Never   Smokeless tobacco: Never  Vaping Use   Vaping status: Never Used  Substance and Sexual Activity   Alcohol use: Yes    Comment: RARELY   Drug use: No   Sexual activity: Yes    Birth control/protection: Post-menopausal, Surgical    Comment: BTL-1st intercourse 16 yo-5 partners

## 2023-05-27 ENCOUNTER — Telehealth: Payer: Self-pay | Admitting: Physician Assistant

## 2023-05-27 NOTE — Telephone Encounter (Signed)
Received vm from patient requesting copy of records from Dr. Cleophas Dunker. IC,lmvm advising needs to come by office and sign authorization to release records. Pts ph [phone]

## 2023-06-01 ENCOUNTER — Ambulatory Visit: Payer: Medicare Other | Attending: Rheumatology | Admitting: Rheumatology

## 2023-06-01 ENCOUNTER — Encounter: Payer: Self-pay | Admitting: Rheumatology

## 2023-06-01 VITALS — BP 154/77 | HR 68 | Ht 64.0 in | Wt 176.0 lb

## 2023-06-01 DIAGNOSIS — M722 Plantar fascial fibromatosis: Secondary | ICD-10-CM | POA: Diagnosis not present

## 2023-06-01 DIAGNOSIS — M25562 Pain in left knee: Secondary | ICD-10-CM

## 2023-06-01 DIAGNOSIS — E0590 Thyrotoxicosis, unspecified without thyrotoxic crisis or storm: Secondary | ICD-10-CM

## 2023-06-01 DIAGNOSIS — I10 Essential (primary) hypertension: Secondary | ICD-10-CM

## 2023-06-01 DIAGNOSIS — M25561 Pain in right knee: Secondary | ICD-10-CM

## 2023-06-01 DIAGNOSIS — M79642 Pain in left hand: Secondary | ICD-10-CM

## 2023-06-01 DIAGNOSIS — M79641 Pain in right hand: Secondary | ICD-10-CM

## 2023-06-01 DIAGNOSIS — M5030 Other cervical disc degeneration, unspecified cervical region: Secondary | ICD-10-CM

## 2023-06-01 DIAGNOSIS — K760 Fatty (change of) liver, not elsewhere classified: Secondary | ICD-10-CM

## 2023-06-01 DIAGNOSIS — H43391 Other vitreous opacities, right eye: Secondary | ICD-10-CM | POA: Diagnosis not present

## 2023-06-01 DIAGNOSIS — R7689 Other specified abnormal immunological findings in serum: Secondary | ICD-10-CM

## 2023-06-01 DIAGNOSIS — R768 Other specified abnormal immunological findings in serum: Secondary | ICD-10-CM | POA: Diagnosis not present

## 2023-06-01 DIAGNOSIS — D126 Benign neoplasm of colon, unspecified: Secondary | ICD-10-CM

## 2023-06-01 DIAGNOSIS — E559 Vitamin D deficiency, unspecified: Secondary | ICD-10-CM

## 2023-06-01 DIAGNOSIS — M25511 Pain in right shoulder: Secondary | ICD-10-CM | POA: Diagnosis not present

## 2023-06-01 DIAGNOSIS — M7061 Trochanteric bursitis, right hip: Secondary | ICD-10-CM | POA: Diagnosis not present

## 2023-06-01 DIAGNOSIS — M170 Bilateral primary osteoarthritis of knee: Secondary | ICD-10-CM | POA: Diagnosis not present

## 2023-06-01 DIAGNOSIS — G4709 Other insomnia: Secondary | ICD-10-CM

## 2023-06-01 DIAGNOSIS — G8929 Other chronic pain: Secondary | ICD-10-CM

## 2023-06-01 DIAGNOSIS — M7062 Trochanteric bursitis, left hip: Secondary | ICD-10-CM

## 2023-06-01 DIAGNOSIS — Z8719 Personal history of other diseases of the digestive system: Secondary | ICD-10-CM

## 2023-06-01 DIAGNOSIS — I119 Hypertensive heart disease without heart failure: Secondary | ICD-10-CM

## 2023-06-01 NOTE — Patient Instructions (Signed)
Hand Exercises Hand exercises can be helpful for almost anyone. They can strengthen your hands and improve flexibility and movement. The exercises can also increase blood flow to the hands. These results can make your work and daily tasks easier for you. Hand exercises can be especially helpful for people who have joint pain from arthritis or nerve damage from using their hands over and over. These exercises can also help people who injure a hand. Exercises Most of these hand exercises are gentle stretching and motion exercises. It is usually safe to do them often throughout the day. Warming up your hands before exercise may help reduce stiffness. You can do this with gentle massage or by placing your hands in warm water for 10-15 minutes. It is normal to feel some stretching, pulling, tightness, or mild discomfort when you begin new exercises. In time, this will improve. Remember to always be careful and stop right away if you feel sudden, very bad pain or your pain gets worse. You want to get better and be safe. Ask your health care provider which exercises are safe for you. Do exercises exactly as told by your provider and adjust them as told. Do not begin these exercises until told by your provider. Knuckle bend or "claw" fist  Stand or sit with your arm, hand, and all five fingers pointed straight up. Make sure to keep your wrist straight. Gently bend your fingers down toward your palm until the tips of your fingers are touching your palm. Keep your big knuckle straight and only bend the small knuckles in your fingers. Hold this position for 10 seconds. Straighten your fingers back to your starting position. Repeat this exercise 5-10 times with each hand. Full finger fist  Stand or sit with your arm, hand, and all five fingers pointed straight up. Make sure to keep your wrist straight. Gently bend your fingers into your palm until the tips of your fingers are touching the middle of your  palm. Hold this position for 10 seconds. Extend your fingers back to your starting position, stretching every joint fully. Repeat this exercise 5-10 times with each hand. Straight fist  Stand or sit with your arm, hand, and all five fingers pointed straight up. Make sure to keep your wrist straight. Gently bend your fingers at the big knuckle, where your fingers meet your hand, and at the middle knuckle. Keep the knuckle at the tips of your fingers straight and try to touch the bottom of your palm. Hold this position for 10 seconds. Extend your fingers back to your starting position, stretching every joint fully. Repeat this exercise 5-10 times with each hand. Tabletop  Stand or sit with your arm, hand, and all five fingers pointed straight up. Make sure to keep your wrist straight. Gently bend your fingers at the big knuckle, where your fingers meet your hand, as far down as you can. Keep the small knuckles in your fingers straight. Think of forming a tabletop with your fingers. Hold this position for 10 seconds. Extend your fingers back to your starting position, stretching every joint fully. Repeat this exercise 5-10 times with each hand. Finger spread  Place your hand flat on a table with your palm facing down. Make sure your wrist stays straight. Spread your fingers and thumb apart from each other as far as you can until you feel a gentle stretch. Hold this position for 10 seconds. Bring your fingers and thumb tight together again. Hold this position for 10 seconds. Repeat   this exercise 5-10 times with each hand. Making circles  Stand or sit with your arm, hand, and all five fingers pointed straight up. Make sure to keep your wrist straight. Make a circle by touching the tip of your thumb to the tip of your index finger. Hold for 10 seconds. Then open your hand wide. Repeat this motion with your thumb and each of your fingers. Repeat this exercise 5-10 times with each hand. Thumb  motion  Sit with your forearm resting on a table and your wrist straight. Your thumb should be facing up toward the ceiling. Keep your fingers relaxed as you move your thumb. Lift your thumb up as high as you can toward the ceiling. Hold for 10 seconds. Bend your thumb across your palm as far as you can, reaching the tip of your thumb for the small finger (pinkie) side of your palm. Hold for 10 seconds. Repeat this exercise 5-10 times with each hand. Grip strengthening  Hold a stress ball or other soft ball in the middle of your hand. Slowly increase the pressure, squeezing the ball as much as you can without causing pain. Think of bringing the tips of your fingers into the middle of your palm. All of your finger joints should bend when doing this exercise. Hold your squeeze for 10 seconds, then relax. Repeat this exercise 5-10 times with each hand. Contact a health care provider if: Your hand pain or discomfort gets much worse when you do an exercise. Your hand pain or discomfort does not improve within 2 hours after you exercise. If you have either of these problems, stop doing these exercises right away. Do not do them again unless your provider says that you can. Get help right away if: You develop sudden, severe hand pain or swelling. If this happens, stop doing these exercises right away. Do not do them again unless your provider says that you can. This information is not intended to replace advice given to you by your health care provider. Make sure you discuss any questions you have with your health care provider. Document Revised: 08/17/2022 Document Reviewed: 08/17/2022 Elsevier Patient Education  2024 [employer] Exercises for Chronic Knee Pain Chronic knee pain is pain that lasts longer than 3 months. For most people with chronic knee pain, exercise and weight loss is an important part of treatment. Your health care provider may want you to focus on: Making the muscles that  support your knee stronger. This can take pressure off your knee and reduce pain. Preventing knee stiffness. How far you can move your knee, keeping it there or making it farther. Losing weight (if this applies) to take pressure off your knee, lower your risk for injury, and make it easier for you to exercise. Your provider will help you make an exercise program that fits your needs and physical abilities. Below are simple, low-impact exercises you can do at home. Ask your provider or physical therapist how often you should do your exercise program and how many times to repeat each exercise. General safety tips  Get your provider's approval before doing any exercises. Start slowly and stop any time you feel pain. Do not exercise if your knee pain is flaring up. Warm up first. Stretching a cold muscle can cause an injury. Do 5-10 minutes of easy movement or light stretching before beginning your exercises. Do 5-10 minutes of low-impact activity (like walking or cycling) before starting strengthening exercises. Contact your provider any time you have pain during   or after exercising. Exercise can cause discomfort but should not be painful. It is normal to be a little stiff or sore after exercising. Stretching and range-of-motion exercises Front thigh stretch  Stand up straight and support your body by holding on to a chair or resting one hand on a wall. With your legs straight and close together, bend one knee to lift your heel up toward your butt. Using one hand for support, grab your ankle with your free hand. Pull your foot up closer toward your butt to feel the stretch in front of your thigh. Hold the stretch for 30 seconds. Repeat __________ times. Complete this exercise __________ times a day. Back thigh stretch  Sit on the floor with your back straight and your legs out straight in front of you. Place the palms of your hands on the floor and slide them toward your feet as you bend at the  hip. Try to touch your nose to your knees and feel the stretch in the back of your thighs. Hold for 30 seconds. Repeat __________ times. Complete this exercise __________ times a day. Calf stretch  Stand facing a wall. Place the palms of your hands flat against the wall, arms extended, and lean slightly against the wall. Get into a lunge position with one leg bent at the knee and the other leg stretched out straight behind you. Keep both feet facing the wall and increase the bend in your knee while keeping the heel of the other leg flat on the ground. You should feel the stretch in your calf. Hold for 30 seconds. Repeat __________ times. Complete this exercise __________ times a day. Strengthening exercises Straight leg lift  Lie on your back with one knee bent and the other leg out straight. Slowly lift the straight leg without bending the knee. Lift until your foot is about 12 inches (30 cm) off the floor. Hold for 3-5 seconds and slowly lower your leg. Repeat __________ times. Complete this exercise __________ times a day. Single leg dip  Stand between two chairs and put both hands on the backs of the chairs for support. Extend one leg out straight with your body weight resting on the heel of the standing leg. Slowly bend your standing knee to dip your body to the level that is comfortable for you. Hold for 3-5 seconds. Repeat __________ times. Complete this exercise __________ times a day. Hamstring curls  Stand straight, knees close together, facing the back of a chair. Hold on to the back of a chair with both hands. Keep one leg straight. Bend the other knee while bringing the heel up toward the butt until the knee is bent at a 90-degree angle (right angle). Hold for 3-5 seconds. Repeat __________ times. Complete this exercise __________ times a day. Wall squat  Stand straight with your back, hips, and head against a wall. Step forward one foot at a time with your back  still against the wall. Your feet should be 2 feet (61 cm) from the wall at shoulder width. Keeping your back, hips, and head against the wall, slide down the wall to as close to a sitting position as you can get. Hold for 5-10 seconds, then slowly slide back up. Repeat __________ times. Complete this exercise __________ times a day. Step-ups  Stand in front of a sturdy platform or stool that is about 6 inches (15 cm) high. Slowly step up with your left / right foot, keeping your knee in line with your hip   and foot. Do not let your knee bend so far that you cannot see your toes. Hold on to a chair for balance, but do not use it for support. Slowly unlock your knee and lower yourself to the starting position. Repeat __________ times. Complete this exercise __________ times a day. Contact a health care provider if: Your exercises cause pain. Your pain is worse after you exercise. Your pain prevents you from doing your exercises. This information is not intended to replace advice given to you by your health care provider. Make sure you discuss any questions you have with your health care provider. Document Revised: 08/17/2022 Document Reviewed: 08/17/2022 Elsevier Patient Education  2024 [employer]  

## 2023-06-03 ENCOUNTER — Other Ambulatory Visit: Payer: Self-pay | Admitting: Cardiovascular Disease

## 2023-06-03 ENCOUNTER — Other Ambulatory Visit: Payer: Self-pay | Admitting: Internal Medicine

## 2023-06-08 ENCOUNTER — Ambulatory Visit: Payer: Medicare Other | Admitting: Orthopedic Surgery

## 2023-06-10 ENCOUNTER — Ambulatory Visit (HOSPITAL_COMMUNITY)
Admission: RE | Admit: 2023-06-10 | Discharge: 2023-06-10 | Disposition: A | Discharge status: Home / Self Care | Payer: Medicare Other | Source: Ambulatory Visit | Attending: Physician Assistant

## 2023-06-10 DIAGNOSIS — M25511 Pain in right shoulder: Secondary | ICD-10-CM | POA: Insufficient documentation

## 2023-06-10 DIAGNOSIS — G8929 Other chronic pain: Secondary | ICD-10-CM

## 2023-06-10 DIAGNOSIS — M75111 Incomplete rotator cuff tear or rupture of right shoulder, not specified as traumatic: Secondary | ICD-10-CM | POA: Diagnosis not present

## 2023-06-10 DIAGNOSIS — M7581 Other shoulder lesions, right shoulder: Secondary | ICD-10-CM | POA: Diagnosis not present

## 2023-06-10 DIAGNOSIS — M19011 Primary osteoarthritis, right shoulder: Secondary | ICD-10-CM | POA: Diagnosis not present

## 2023-06-12 ENCOUNTER — Other Ambulatory Visit (HOSPITAL_BASED_OUTPATIENT_CLINIC_OR_DEPARTMENT_OTHER): Payer: Self-pay | Admitting: Cardiovascular Disease

## 2023-06-13 ENCOUNTER — Ambulatory Visit: Payer: Medicare Other | Admitting: Internal Medicine

## 2023-06-14 ENCOUNTER — Ambulatory Visit (INDEPENDENT_AMBULATORY_CARE_PROVIDER_SITE_OTHER): Payer: Medicare Other

## 2023-06-14 VITALS — Ht 64.0 in | Wt 176.0 lb

## 2023-06-14 DIAGNOSIS — Z1212 Encounter for screening for malignant neoplasm of rectum: Secondary | ICD-10-CM

## 2023-06-14 DIAGNOSIS — Z1231 Encounter for screening mammogram for malignant neoplasm of breast: Secondary | ICD-10-CM | POA: Diagnosis not present

## 2023-06-14 DIAGNOSIS — Z780 Asymptomatic menopausal state: Secondary | ICD-10-CM

## 2023-06-14 DIAGNOSIS — Z1211 Encounter for screening for malignant neoplasm of colon: Secondary | ICD-10-CM | POA: Diagnosis not present

## 2023-06-14 DIAGNOSIS — Z0000 Encounter for general adult medical examination without abnormal findings: Secondary | ICD-10-CM

## 2023-06-14 NOTE — Progress Notes (Cosign Needed)
Subjective:   Karina Newman is a 67 y.o. female who presents for Medicare Annual (Subsequent) preventive examination.  Visit Complete: Virtual I connected with  Harrel Carina on 06/14/23 by a audio enabled telemedicine application and verified that I am speaking with the correct person using two identifiers.  Patient Location: Home  Provider Location: Home Office  I discussed the limitations of evaluation and management by telemedicine. The patient expressed understanding and agreed to proceed.  Vital Signs: Because this visit was a virtual/telehealth visit, some criteria may be missing or patient reported. Any vitals not documented were not able to be obtained and vitals that have been documented are patient reported.   Cardiac Risk Factors include: advanced age (>67men, >77 women)     Objective:    Today's Vitals   06/14/23 1300 06/14/23 1303  Weight: 176 lb (79.8 kg)   Height: 5\' 4"  (1.626 m)   PainSc:  8    Body mass index is 30.21 kg/m.     06/14/2023    1:14 PM 06/07/2022    2:16 PM 03/11/2022    7:47 AM 03/31/2015    4:24 PM  Advanced Directives  Does Patient Have a Medical Advance Directive? No No No No  Would patient like information on creating a medical advance directive?  No - Patient declined No - Guardian declined     Current Medications (verified) Outpatient Encounter Medications as of 06/14/2023  Medication Sig   amLODipine (NORVASC) 5 MG tablet TAKE 1 TABLET (5 MG TOTAL) BY MOUTH DAILY.   carvedilol (COREG) 12.5 MG tablet TAKE 1 TABLET BY MOUTH 2 TIMES DAILY.   cetirizine (ZYRTEC ALLERGY) 10 MG tablet Take 1 tablet (10 mg total) by mouth daily.   Cholecalciferol 50 MCG (2000 UT) CAPS Take by mouth.   Cyanocobalamin (VITAMIN B-12) 500 MCG SUBL 1 sl qd   cycloSPORINE (RESTASIS) 0.05 % ophthalmic emulsion Place 1 drop into both eyes 2 (two) times daily.   Diclofenac Sodium 1.5 % SOLN USE 15 DROPS THREE TIMES DAILY EXTERNALLY FOR PAIN   dicyclomine  (BENTYL) 10 MG capsule TAKE 1 CAPSULE (10 MG TOTAL) BY MOUTH EVERY 8 (EIGHT) HOURS AS NEEDED FOR SPASMS.   Difluprednate 0.05 % EMUL Apply 1 drop to eye 4 (four) times daily.   diphenhydrAMINE (BENADRYL) 25 MG tablet Take 25 mg by mouth every 6 (six) hours as needed for allergies.   doxazosin (CARDURA) 4 MG tablet TAKE 1 TABLET BY MOUTH EVERY DAY   DULoxetine (CYMBALTA) 20 MG capsule TAKE 1 CAPSULE BY MOUTH EVERY DAY   fluticasone (FLONASE) 50 MCG/ACT nasal spray Place 2 sprays into both nostrils daily.   gabapentin (NEURONTIN) 100 MG capsule TAKE 1 CAPSULE BY MOUTH THREE TIMES A DAY AS NEEDED   ipratropium (ATROVENT) 0.06 % nasal spray Place 1 spray into both nostrils 3 (three) times daily as needed for rhinitis.   LORazepam (ATIVAN) 0.5 MG tablet Take 1 tablet (0.5 mg total) by mouth 2 (two) times daily as needed for anxiety or sleep.   omeprazole (PRILOSEC) 40 MG capsule TAKE 1 CAPSULE BY MOUTH EVERY DAY IN THE MORNING AND AT BEDTIME   oxyCODONE-acetaminophen (PERCOCET/ROXICET) 5-325 MG tablet Take 1 tablet by mouth every 8 (eight) hours as needed for severe pain.   Potassium Chloride ER 20 MEQ TBCR Take 1 tablet (20 mEq total) by mouth daily.   prednisoLONE acetate (PRED FORTE) 1 % ophthalmic suspension Place 1 drop into the right eye daily.   PROLENSA 0.07 %  SOLN Apply to eye.   pseudoephedrine (SUDAFED) 120 MG 12 hr tablet Take by mouth.   sucralfate (CARAFATE) 1 g tablet TAKE 1 TABLET BY MOUTH 3 TIMES A DAY BETWEEN MEALS   SUMAtriptan (IMITREX) 100 MG tablet Take one prn migraine. May repeat in 2 hours if headache persists or recurs.   telmisartan-hydrochlorothiazide (MICARDIS HCT) 80-25 MG tablet TAKE 1 TABLET BY MOUTH EVERY DAY   traZODone (DESYREL) 50 MG tablet TAKE 1 TABLET BY MOUTH EVERYDAY AT BEDTIME   Vitamin D, Cholecalciferol, 1000 units CAPS Take 2,000 Units by mouth daily.   acetaminophen (TYLENOL) 500 MG tablet Take 1,000 mg by mouth every 6 (six) hours as needed for moderate  pain. (Patient not taking: Reported on 03/18/2023)   cyclobenzaprine (FLEXERIL) 5 MG tablet Take 1 tablet (5 mg total) by mouth 3 (three) times daily as needed for muscle spasms. (Patient not taking: Reported on 06/14/2023)   predniSONE (DELTASONE) 5 MG tablet Take 4 tabs po x 4 days, 3  tabs po x 4 days, 2  tabs po x 4 days, 1  tab po x 4 days (Patient not taking: Reported on 06/14/2023)   No facility-administered encounter medications on file as of 06/14/2023.    Allergies (verified) Penicillins, Tramadol, Iodine, Lexapro [escitalopram], Lyrica [pregabalin], Nsaids, and Voltaren [diclofenac]   History: Past Medical History:  Diagnosis Date   Abnormal cervical Pap smear with positive HPV DNA test 01/2014,01/2017   2015 Normal cytology with positive high-risk HPV 18/45. Colposcopy negative with negative ECC.  2018 normal cytology with positive high-risk HPV 18/45   Anemia    Apnea 05/17/2018   Autoimmune gastritis    Fatty liver    Gastritis    Heart murmur    Hypertension    Hyperthyroidism    Inappropriate sinus tachycardia (HCC) 05/17/2018   Internal hemorrhoids    Osteoarthritis    Right shoulder pain 2001   chronic pain   Thyroid disease    Hyperthyroid. Was on PTU (Dr Willaim Bane in [place]) - stopped in 2013, stable   Tubular adenoma of colon    Past Surgical History:  Procedure Laterality Date   BIOPSY  03/11/2022   Procedure: BIOPSY;  Surgeon: Meridee Score Netty Starring., MD;  Location: [facility] ENDOSCOPY;  Service: Gastroenterology;;   BREAST CYST ASPIRATION Left 2012   CATARACT EXTRACTION Right 06/15/2022   CATARACT EXTRACTION Left 06/28/2022   CESAREAN SECTION     x 4    COLONOSCOPY     ESOPHAGOGASTRODUODENOSCOPY (EGD) WITH PROPOFOL N/A 03/11/2022   Procedure: ESOPHAGOGASTRODUODENOSCOPY (EGD) WITH PROPOFOL;  Surgeon: Lemar Lofty., MD;  Location: [facility] ENDOSCOPY;  Service: Gastroenterology;  Laterality: N/A;   EUS N/A 03/11/2022   Procedure: UPPER ENDOSCOPIC ULTRASOUND (EUS)  RADIAL;  Surgeon: Lemar Lofty., MD;  Location: [facility] ENDOSCOPY;  Service: Gastroenterology;  Laterality: N/A;   HAND SURGERY Left    SHOULDER SURGERY Right 2002   SHOULDER SURGERY Left 2014   TUBAL LIGATION     UPPER GASTROINTESTINAL ENDOSCOPY     Vitrectomy surgery Right 03/21/2023   Also cyst removal/ Lt eye done 04/21/2023   Family History  Problem Relation Age of Onset   Kidney disease Mother        ESRD   Hypertension Mother    Lung cancer Father    Hypertension Sister    Kidney disease Sister    Hypertension Sister    Diabetes Brother    Hypertension Brother    Hypertension Brother    Diabetes Paternal  Grandmother    Sleep apnea Son    Hypertension Son    Colon cancer Neg Hx    Stomach cancer Neg Hx    Esophageal cancer Neg Hx    Rectal cancer Neg Hx    Pancreatic cancer Neg Hx    Social History   Socioeconomic History   Marital status: Married    Spouse name: Benita Gutter   Number of children: 4   Years of education: Not on file   Highest education level: Not on file  Occupational History   Occupation: RETIRED  Tobacco Use   Smoking status: Never    Passive exposure: Never   Smokeless tobacco: Never  Vaping Use   Vaping status: Never Used  Substance and Sexual Activity   Alcohol use: Yes    Comment: RARELY   Drug use: No   Sexual activity: Yes    Birth control/protection: Post-menopausal, Surgical    Comment: BTL-1st intercourse 16 yo-5 partners  Other Topics Concern   Not on file  Social History Narrative   Live with husbands   Social Determinants of Health   Financial Resource Strain: Low Risk  (06/14/2023)   Overall Financial Resource Strain (CARDIA)    Difficulty of Paying Living Expenses: Not very hard  Food Insecurity: No Food Insecurity (06/14/2023)   Hunger Vital Sign    Worried About Running Out of Food in the Last Year: Never true    Ran Out of Food in the Last Year: Never true  Transportation Needs: No Transportation Needs  (06/14/2023)   PRAPARE - Administrator, Civil Service (Medical): No    Lack of Transportation (Non-Medical): No  Physical Activity: Insufficiently Active (06/14/2023)   Exercise Vital Sign    Days of Exercise per Week: 3 days    Minutes of Exercise per Session: 30 min  Stress: No Stress Concern Present (06/14/2023)   [employer] of Occupational Health - Occupational Stress Questionnaire    Feeling of Stress : Only a little  Social Connections: Moderately Isolated (06/14/2023)   Social Connection and Isolation Panel [NHANES]    Frequency of Communication with Friends and Family: More than three times a week    Frequency of Social Gatherings with Friends and Family: Once a week    Attends Religious Services: Never    Database administrator or Organizations: No    Attends Engineer, structural: Never    Marital Status: Married    Tobacco Counseling Counseling given: Not Answered   Clinical Intake:  Pre-visit preparation completed: Yes  Pain : 0-10 Pain Score: 8  Pain Type: Acute pain Pain Location: Back (neck,shoulder) Pain Orientation: Lower Pain Onset: More than a month ago (every day for years-per pt)     BMI - recorded: 30.12 Nutritional Risks: None Diabetes: No  How often do you need to have someone help you when you read instructions, pamphlets, or other written materials from your doctor or pharmacy?: 1 - Never     Information entered by :: Amaurie Wandel, RMA   Activities of Daily Living    06/14/2023    1:06 PM  In your present state of health, do you have any difficulty performing the following activities:  Hearing? 0  Vision? 0  Difficulty concentrating or making decisions? 0  Walking or climbing stairs? 0  Dressing or bathing? 0  Doing errands, shopping? 0  Comment husband drives her  Preparing Food and eating ? N  Using the Toilet? N  In  the past six months, have you accidently leaked urine? N  Do you have problems  with loss of bowel control? N  Managing your Medications? N  Managing your Finances? N  Housekeeping or managing your Housekeeping? N    Patient Care Team: Plotnikov, Georgina Quint, MD as PCP - General (Internal Medicine) Chilton Si, MD as PCP - Cardiology (Cardiology) Pollyann Savoy, MD as Consulting Physician (Rheumatology) Rodrigo Ran, OD as Consulting Physician (Ophthalmology) Stephannie Li, MD as Consulting Physician (Ophthalmology)  Indicate any recent Medical Services you may have received from other than [facility] providers in the past year (date may be approximate).     Assessment:   This is a routine wellness examination for [place].  Hearing/Vision screen Hearing Screening - Comments:: Denies hearing difficulties   Vision Screening - Comments:: Denies vision issues   Goals Addressed   None   Depression Screen    06/14/2023    1:21 PM 03/10/2023    1:40 PM 12/07/2022   11:17 AM 09/07/2022    1:20 PM 06/07/2022    1:25 PM 09/28/2021    1:22 PM 09/17/2020    4:12 PM  PHQ 2/9 Scores  PHQ - 2 Score 3 0 2 0 3 2   PHQ- 9 Score 6  4  6 4    Exception Documentation       Patient refusal    Fall Risk    06/14/2023    1:15 PM 03/10/2023    1:40 PM 09/07/2022    1:19 PM 06/07/2022    1:24 PM 09/28/2021    1:22 PM  Fall Risk   Falls in the past year? 0 0 0 0 0  Number falls in past yr: 0 0 0 0 0  Injury with Fall? 0 0 0 0 0  Risk for fall due to : No Fall Risks No Fall Risks No Fall Risks No Fall Risks No Fall Risks  Follow up Falls prevention discussed;Falls evaluation completed Falls evaluation completed Falls evaluation completed  Falls evaluation completed    MEDICARE RISK AT HOME: Medicare Risk at Home Any stairs in or around the home?: Yes If so, are there any without handrails?: Yes Home free of loose throw rugs in walkways, pet beds, electrical cords, etc?: No (pt has rugs around) Adequate lighting in your home to reduce risk of falls?: Yes Life  alert?: No Use of a cane, walker or w/c?: No Grab bars in the bathroom?: No Shower chair or bench in shower?: No Elevated toilet seat or a handicapped toilet?: No  TIMED UP AND GO:  Was the test performed?  No    Cognitive Function:        06/14/2023    1:16 PM 06/07/2022    2:18 PM  6CIT Screen  What Year? 0 points 0 points  What month? 0 points 0 points  What time? 0 points 0 points  Count back from 20 0 points 0 points  Months in reverse 0 points 0 points  Repeat phrase 0 points 0 points  Total Score 0 points 0 points    Immunizations Immunization History  Administered Date(s) Administered   Tdap 10/17/2021   Zoster Recombinant(Shingrix) 03/25/2017, 06/27/2017    TDAP status: Up to date  Flu Vaccine status: Declined, Education has been provided regarding the importance of this vaccine but patient still declined. Advised may receive this vaccine at local pharmacy or Health Dept. Aware to provide a copy of the vaccination record if obtained from local pharmacy or  Health Dept. Verbalized acceptance and understanding.  Pneumococcal vaccine status: Declined,  Education has been provided regarding the importance of this vaccine but patient still declined. Advised may receive this vaccine at local pharmacy or Health Dept. Aware to provide a copy of the vaccination record if obtained from local pharmacy or Health Dept. Verbalized acceptance and understanding.   Covid-19 vaccine status: Declined, Education has been provided regarding the importance of this vaccine but patient still declined. Advised may receive this vaccine at local pharmacy or Health Dept.or vaccine clinic. Aware to provide a copy of the vaccination record if obtained from local pharmacy or Health Dept. Verbalized acceptance and understanding.  Qualifies for Shingles Vaccine? Yes   Zostavax completed Yes   Shingrix Completed?: Yes  Screening Tests Health Maintenance  Topic Date Due   Colonoscopy   05/06/2021   Pneumonia Vaccine 43+ Years old (1 of 1 - PCV) Never done   DEXA SCAN  Never done   MAMMOGRAM  04/04/2023   Medicare Annual Wellness (AWV)  06/13/2024   DTaP/Tdap/Td (2 - Td or Tdap) 10/18/2031   Hepatitis C Screening  Completed   Zoster Vaccines- Shingrix  Completed   HPV VACCINES  Aged Out   INFLUENZA VACCINE  Discontinued   COVID-19 Vaccine  Discontinued    Health Maintenance  Health Maintenance Due  Topic Date Due   Colonoscopy  05/06/2021   Pneumonia Vaccine 10+ Years old (1 of 1 - PCV) Never done   DEXA SCAN  Never done   MAMMOGRAM  04/04/2023    Colorectal cancer screening: Referral to GI placed 06/14/2023. Pt aware the office will call re: appt.  Mammogram status: Ordered 06/14/2023. Pt provided with contact info and advised to call to schedule appt.   Bone Density status: Ordered 06/14/2023. Pt provided with contact info and advised to call to schedule appt.  Lung Cancer Screening: (Low Dose CT Chest recommended if Age 69-80 years, 20 pack-year currently smoking OR have quit w/in 15years.) does not qualify.   Lung Cancer Screening Referral: N/AS  Additional Screening:  Hepatitis C Screening: does qualify; Completed 01/19/2017  Vision Screening: Recommended annual ophthalmology exams for early detection of glaucoma and other disorders of the eye. Is the patient up to date with their annual eye exam?  Yes  Who is the provider or what is the name of the office in which the patient attends annual eye exams? Dr. Allyne Gee If pt is not established with a provider, would they like to be referred to a provider to establish care? No .   Dental Screening: Recommended annual dental exams for proper oral hygiene   Community Resource Referral / Chronic Care Management: CRR required this visit?  No   CCM required this visit?  No     Plan:     I have personally reviewed and noted the following in the patient's chart:   Medical and social history Use of  alcohol, tobacco or illicit drugs  Current medications and supplements including opioid prescriptions. Patient is currently taking opioid prescriptions. Information provided to patient regarding non-opioid alternatives. Patient advised to discuss non-opioid treatment plan with their provider. Functional ability and status Nutritional status Physical activity Advanced directives List of other physicians Hospitalizations, surgeries, and ER visits in previous 12 months Vitals Screenings to include cognitive, depression, and falls Referrals and appointments  In addition, I have reviewed and discussed with patient certain preventive protocols, quality metrics, and best practice recommendations. A written personalized care plan for preventive services as  well as general preventive health recommendations were provided to patient.     Lidie Glade L Kaylyn Garrow, CMA   06/14/2023   After Visit Summary: (MyChart) Due to this being a telephonic visit, the after visit summary with patients personalized plan was offered to patient via MyChart   Nurse Notes: Patient is due for a mammogram, colonoscopy and a DEXA.  I have placed the order for all today.  Patient declines pneumonia, flu, shingles and covid vaccines.  Patient had no other concerns to address today.      Medical screening examination/treatment/procedure(s) were performed by non-physician practitioner and as supervising physician I was immediately available for consultation/collaboration.  I agree with above. Jacinta Shoe, MD

## 2023-06-14 NOTE — Patient Instructions (Addendum)
Karina Newman , Thank you for taking time to come for your Medicare Wellness Visit. I appreciate your ongoing commitment to your health goals. Please review the following plan we discussed and let me know if I can assist you in the future.   Referrals/Orders/Follow-Ups/Clinician Recommendations: You are due for a Mammogram, a colonoscopy and a Bone density screening.  You have an order for:   [x]   3D Mammogram  [x]   Bone Density     Please call for appointment:  [facility] [address] [place], [place] [address] [phone]  For colonoscopy please call: [facility] [address] [place], [place] [address] [phone]    Make sure to wear two-piece clothing.  No lotions, powders, or deodorants the day of the appointment. Make sure to bring picture ID and insurance card.  Bring list of medications you are currently taking including any supplements.   Schedule your [place] screening mammogram through MyChart!   Log into your MyChart account.  Go to 'Visit' (or 'Appointments' if on mobile App) --> Schedule an Appointment  Under 'Select a Reason for Visit' choose the Mammogram Screening option.  Complete the pre-visit questions and select the time and place that best fits your schedule.    This is a list of the screening recommended for you and due dates:  Health Maintenance  Topic Date Due   Colon Cancer Screening  05/06/2021   Pneumonia Vaccine (1 of 1 - PCV) Never done   DEXA scan (bone density measurement)  Never done   Mammogram  04/04/2023   Medicare Annual Wellness Visit  06/13/2024   DTaP/Tdap/Td vaccine (2 - Td or Tdap) 10/18/2031   Hepatitis C Screening  Completed   Zoster (Shingles) Vaccine  Completed   HPV Vaccine  Aged Out   Flu Shot  Discontinued   COVID-19 Vaccine  Discontinued    Advanced directives: (Copy Requested) Please bring a copy of your health care power of attorney and living will to the  office to be added to your chart at your convenience.  Next Medicare Annual Wellness Visit scheduled for next year: Yes

## 2023-06-15 ENCOUNTER — Ambulatory Visit (INDEPENDENT_AMBULATORY_CARE_PROVIDER_SITE_OTHER): Payer: Medicare Other | Admitting: Internal Medicine

## 2023-06-15 ENCOUNTER — Encounter: Payer: Self-pay | Admitting: Internal Medicine

## 2023-06-15 VITALS — BP 118/80 | HR 73 | Temp 98.3°F | Ht 64.0 in | Wt 177.0 lb

## 2023-06-15 DIAGNOSIS — M545 Low back pain, unspecified: Secondary | ICD-10-CM

## 2023-06-15 DIAGNOSIS — S43431S Superior glenoid labrum lesion of right shoulder, sequela: Secondary | ICD-10-CM

## 2023-06-15 DIAGNOSIS — R739 Hyperglycemia, unspecified: Secondary | ICD-10-CM | POA: Insufficient documentation

## 2023-06-15 DIAGNOSIS — M5010 Cervical disc disorder with radiculopathy, unspecified cervical region: Secondary | ICD-10-CM

## 2023-06-15 DIAGNOSIS — M79604 Pain in right leg: Secondary | ICD-10-CM | POA: Diagnosis not present

## 2023-06-15 DIAGNOSIS — R7401 Elevation of levels of liver transaminase levels: Secondary | ICD-10-CM | POA: Diagnosis not present

## 2023-06-15 LAB — COMPREHENSIVE METABOLIC PANEL
ALT: 54 U/L — ABNORMAL HIGH (ref 0–35)
AST: 33 U/L (ref 0–37)
Albumin: 4 g/dL (ref 3.5–5.2)
Alkaline Phosphatase: 79 U/L (ref 39–117)
BUN: 8 mg/dL (ref 6–23)
CO2: 32 meq/L (ref 19–32)
Calcium: 9.4 mg/dL (ref 8.4–10.5)
Chloride: 101 meq/L (ref 96–112)
Creatinine, Ser: 0.79 mg/dL (ref 0.40–1.20)
GFR: 78 mL/min (ref >60.00)
Glucose, Bld: 110 mg/dL — ABNORMAL HIGH (ref 70–99)
Potassium: 3.8 meq/L (ref 3.5–5.1)
Sodium: 139 meq/L (ref 135–145)
Total Bilirubin: 0.5 mg/dL (ref 0.2–1.2)
Total Protein: 7.6 g/dL (ref 6.0–8.3)

## 2023-06-15 LAB — HEMOGLOBIN A1C: Hgb A1c MFr Bld: 6.3 % (ref 4.6–6.5)

## 2023-06-15 MED ORDER — DICLOFENAC SODIUM 1.5 % EX SOLN: CUTANEOUS | 5 refills | Status: DC

## 2023-06-15 MED ORDER — GABAPENTIN 100 MG PO CAPS: ORAL_CAPSULE | ORAL | 3 refills | Status: DC

## 2023-06-15 MED ORDER — SUMATRIPTAN SUCCINATE 100 MG PO TABS: ORAL_TABLET | ORAL | 5 refills | Status: DC

## 2023-06-15 MED ORDER — OXYCODONE HCL 5 MG PO TABS: 5.0000 mg | ORAL_TABLET | Freq: Three times a day (TID) | ORAL | 0 refills | Status: DC | PRN

## 2023-06-15 NOTE — Assessment & Plan Note (Signed)
Mildly elevated ALT in July 2024 D/c Percocet Start Oxy 5 mg instead Chronic problem.  Reasonably well-controlled on current therapy.  Potential benefits of a long term opioids use as well as potential risks (i.e. addiction risk, apnea etc) and complications (i.e. Somnolence, constipation and others) were explained to the patient and were aknowledged.

## 2023-06-15 NOTE — Assessment & Plan Note (Signed)
Mildly elevated ALT in July 2024 D/c Percocet Start Oxy 5 mg instead

## 2023-06-15 NOTE — Assessment & Plan Note (Signed)
Check A1c. 

## 2023-06-15 NOTE — Progress Notes (Signed)
Subjective:  Patient ID: Karina Newman, female    DOB: 09-May-1956  Age: 67 y.o. MRN: [id number]  CC: Medical Management of Chronic Issues (3 MNTH F/U, discuss concerns about pts liver)   HPI Karina Newman presents for chronic pain, depression, HTN  C/o elevated ALT in June 2024  F/u elevated glucose, HA   Outpatient Medications Prior to Visit  Medication Sig Dispense Refill   amLODipine (NORVASC) 5 MG tablet TAKE 1 TABLET (5 MG TOTAL) BY MOUTH DAILY. 90 tablet 0   carvedilol (COREG) 12.5 MG tablet TAKE 1 TABLET BY MOUTH 2 TIMES DAILY. 180 tablet 3   cetirizine (ZYRTEC ALLERGY) 10 MG tablet Take 1 tablet (10 mg total) by mouth daily. 30 tablet 5   Cholecalciferol 50 MCG (2000 UT) CAPS Take by mouth.     Cyanocobalamin (VITAMIN B-12) 500 MCG SUBL 1 sl qd 100 tablet 5   cycloSPORINE (RESTASIS) 0.05 % ophthalmic emulsion Place 1 drop into both eyes 2 (two) times daily.     dicyclomine (BENTYL) 10 MG capsule TAKE 1 CAPSULE (10 MG TOTAL) BY MOUTH EVERY 8 (EIGHT) HOURS AS NEEDED FOR SPASMS. 270 capsule 3   Difluprednate 0.05 % EMUL Apply 1 drop to eye 4 (four) times daily.     diphenhydrAMINE (BENADRYL) 25 MG tablet Take 25 mg by mouth every 6 (six) hours as needed for allergies.     doxazosin (CARDURA) 4 MG tablet TAKE 1 TABLET BY MOUTH EVERY DAY 90 tablet 1   DULoxetine (CYMBALTA) 20 MG capsule TAKE 1 CAPSULE BY MOUTH EVERY DAY 90 capsule 1   fluticasone (FLONASE) 50 MCG/ACT nasal spray Place 2 sprays into both nostrils daily. 16 g 5   ipratropium (ATROVENT) 0.06 % nasal spray Place 1 spray into both nostrils 3 (three) times daily as needed for rhinitis. 15 mL 5   LORazepam (ATIVAN) 0.5 MG tablet Take 1 tablet (0.5 mg total) by mouth 2 (two) times daily as needed for anxiety or sleep. 60 tablet 2   omeprazole (PRILOSEC) 40 MG capsule TAKE 1 CAPSULE BY MOUTH EVERY DAY IN THE MORNING AND AT BEDTIME 180 capsule 3   Potassium Chloride ER 20 MEQ TBCR Take 1 tablet (20 mEq total) by mouth  daily. 90 tablet 3   prednisoLONE acetate (PRED FORTE) 1 % ophthalmic suspension Place 1 drop into the right eye daily.     PROLENSA 0.07 % SOLN Apply to eye.     pseudoephedrine (SUDAFED) 120 MG 12 hr tablet Take by mouth.     sucralfate (CARAFATE) 1 g tablet TAKE 1 TABLET BY MOUTH 3 TIMES A DAY BETWEEN MEALS 270 tablet 3   telmisartan-hydrochlorothiazide (MICARDIS HCT) 80-25 MG tablet TAKE 1 TABLET BY MOUTH EVERY DAY 30 tablet 0   traZODone (DESYREL) 50 MG tablet TAKE 1 TABLET BY MOUTH EVERYDAY AT BEDTIME 90 tablet 3   Vitamin D, Cholecalciferol, 1000 units CAPS Take 2,000 Units by mouth daily.     Diclofenac Sodium 1.5 % SOLN USE 15 DROPS THREE TIMES DAILY EXTERNALLY FOR PAIN 150 mL 5   gabapentin (NEURONTIN) 100 MG capsule TAKE 1 CAPSULE BY MOUTH THREE TIMES A DAY AS NEEDED 90 capsule 3   oxyCODONE-acetaminophen (PERCOCET/ROXICET) 5-325 MG tablet Take 1 tablet by mouth every 8 (eight) hours as needed for severe pain. 90 tablet 0   SUMAtriptan (IMITREX) 100 MG tablet Take one prn migraine. May repeat in 2 hours if headache persists or recurs. 12 tablet 5   acetaminophen (TYLENOL) 500 MG tablet  Take 1,000 mg by mouth every 6 (six) hours as needed for moderate pain. (Patient not taking: Reported on 03/18/2023)     cyclobenzaprine (FLEXERIL) 5 MG tablet Take 1 tablet (5 mg total) by mouth 3 (three) times daily as needed for muscle spasms. (Patient not taking: Reported on 06/14/2023) 60 tablet 2   predniSONE (DELTASONE) 5 MG tablet Take 4 tabs po x 4 days, 3  tabs po x 4 days, 2  tabs po x 4 days, 1  tab po x 4 days (Patient not taking: Reported on 06/14/2023) 40 tablet 0   No facility-administered medications prior to visit.    ROS: Review of Systems  Constitutional:  Negative for activity change, appetite change, chills, fatigue and unexpected weight change.  HENT:  Negative for congestion, mouth sores and sinus pressure.   Eyes:  Negative for visual disturbance.  Respiratory:  Negative for  cough and chest tightness.   Gastrointestinal:  Negative for abdominal pain and nausea.  Genitourinary:  Negative for difficulty urinating, frequency and vaginal pain.  Musculoskeletal:  Negative for back pain and gait problem.  Skin:  Negative for pallor and rash.  Neurological:  Negative for dizziness, tremors, weakness, numbness and headaches.  Psychiatric/Behavioral:  Negative for confusion and sleep disturbance.     Objective:  BP 118/80 (BP Location: Right Arm, Patient Position: Sitting, Cuff Size: Normal)   Pulse 73   Temp 98.3 F (36.8 C) (Oral)   Ht 5\' 4"  (1.626 m)   Wt 177 lb (80.3 kg)   SpO2 96%   BMI 30.38 kg/m   BP Readings from Last 3 Encounters:  06/15/23 118/80  06/01/23 (!) 154/77  03/18/23 124/78    Wt Readings from Last 3 Encounters:  06/15/23 177 lb (80.3 kg)  06/14/23 176 lb (79.8 kg)  06/01/23 176 lb (79.8 kg)    Physical Exam Constitutional:      General: She is not in acute distress.    Appearance: She is well-developed.  HENT:     Head: Normocephalic.     Right Ear: External ear normal.     Left Ear: External ear normal.     Nose: Nose normal.  Eyes:     General:        Right eye: No discharge.        Left eye: No discharge.     Conjunctiva/sclera: Conjunctivae normal.     Pupils: Pupils are equal, round, and reactive to light.  Neck:     Thyroid: No thyromegaly.     Vascular: No JVD.     Trachea: No tracheal deviation.  Cardiovascular:     Rate and Rhythm: Normal rate and regular rhythm.     Heart sounds: Normal heart sounds.  Pulmonary:     Effort: No respiratory distress.     Breath sounds: No stridor. No wheezing.  Abdominal:     General: Bowel sounds are normal. There is no distension.     Palpations: Abdomen is soft. There is no mass.     Tenderness: There is abdominal tenderness. There is no guarding or rebound.  Musculoskeletal:        General: No tenderness.     Cervical back: Normal range of motion and neck supple. No  rigidity.  Lymphadenopathy:     Cervical: No cervical adenopathy.  Skin:    Findings: No erythema or rash.  Neurological:     Cranial Nerves: No cranial nerve deficit.     Motor: No abnormal muscle tone.  Coordination: Coordination normal.     Gait: Gait normal.     Deep Tendon Reflexes: Reflexes normal.  Psychiatric:        Behavior: Behavior normal.        Thought Content: Thought content normal.        Judgment: Judgment normal.     Lab Results  Component Value Date   WBC 4.4 03/16/2023   HGB 14.4 03/16/2023   HCT 42.7 03/16/2023   PLT 354 03/16/2023   GLUCOSE 123 (H) 03/16/2023   CHOL 161 12/04/2020   TRIG 101.0 12/04/2020   HDL 40.20 12/04/2020   LDLCALC 101 (H) 12/04/2020   ALT 49 (H) 03/16/2023   AST 31 03/16/2023   NA 139 03/16/2023   K 3.9 03/16/2023   CL 101 03/16/2023   CREATININE 0.87 03/16/2023   BUN 10 03/16/2023   CO2 29 03/16/2023   TSH 0.65 03/01/2022    MR SHOULDER RIGHT WO CONTRAST  Result Date: 06/13/2023 CLINICAL DATA:  Chronic right shoulder pain EXAM: MRI OF THE RIGHT SHOULDER WITHOUT CONTRAST TECHNIQUE: Multiplanar, multisequence MR imaging of the shoulder was performed. No intravenous contrast was administered. COMPARISON:  None Available. FINDINGS: Rotator cuff: Mild tendinosis of the supraspinatus tendon with a small partial-thickness articular surface tear. Mild tendinosis of the infraspinatus tendon. Teres minor tendon is intact. Subscapularis tendon is intact. Muscles: No muscle atrophy or edema. No intramuscular fluid collection or hematoma. Biceps Long Head: Mild tendinosis of the intra-articular portion of the long head of the biceps tendon. Acromioclavicular Joint: Mild arthropathy of the acromioclavicular joint. Trace subacromial/subdeltoid bursal fluid. Glenohumeral Joint: No joint effusion. Mild partial-thickness cartilage loss of the glenohumeral joint. Labrum: Grossly intact, but evaluation is limited by lack of intraarticular  fluid/contrast. Bones: No fracture or dislocation. No aggressive osseous lesion. Other: No fluid collection or hematoma. IMPRESSION: 1. Mild tendinosis of the supraspinatus tendon with a small partial-thickness articular surface tear. 2. Mild tendinosis of the infraspinatus tendon. 3. Mild tendinosis of the intra-articular portion of the long head of the biceps tendon. 4. Mild osteoarthritis of the glenohumeral joint. Electronically Signed   By: Elige Ko M.D.   On: 06/13/2023 13:02    Assessment & Plan:   Problem List Items Addressed This Visit     Labral tear of shoulder    Mildly elevated ALT in July 2024 D/c Percocet Start Oxy 5 mg instead Chronic problem.  Reasonably well-controlled on current therapy.  Potential benefits of a long term opioids use as well as potential risks (i.e. addiction risk, apnea etc) and complications (i.e. Somnolence, constipation and others) were explained to the patient and were aknowledged.      Cervical disc disorder with radiculopathy of cervical region    Mildly elevated ALT in July 2024 D/c Percocet Start Oxy 5 mg instead Chronic problem.  Reasonably well-controlled on current therapy.  Potential benefits of a long term opioids use as well as potential risks (i.e. addiction risk, apnea etc) and complications (i.e. Somnolence, constipation and others) were explained to the patient and were aknowledged.        Relevant Medications   gabapentin (NEURONTIN) 100 MG capsule   Low back pain radiating to right lower extremity    Mildly elevated ALT in July 2024 D/c Percocet Start Oxy 5 mg instead Chronic problem.  Reasonably well-controlled on current therapy.  Potential benefits of a long term opioids use as well as potential risks (i.e. addiction risk, apnea etc) and complications (i.e. Somnolence, constipation and others)  were explained to the patient and were aknowledged.      Relevant Medications   oxyCODONE (ROXICODONE) 5 MG immediate release  tablet   Elevated ALT measurement - Primary    Mildly elevated ALT in July 2024 D/c Percocet Start Oxy 5 mg instead      Hyperglycemia    Check A1c      Relevant Orders   Comprehensive metabolic panel   Hemoglobin A1c      Meds ordered this encounter  Medications   gabapentin (NEURONTIN) 100 MG capsule    Sig: TAKE 1 CAPSULE BY MOUTH THREE TIMES A DAY AS NEEDED    Dispense:  90 capsule    Refill:  3   oxyCODONE (ROXICODONE) 5 MG immediate release tablet    Sig: Take 1 tablet (5 mg total) by mouth every 8 (eight) hours as needed for severe pain (pain score 7-10).    Dispense:  90 tablet    Refill:  0   Diclofenac Sodium 1.5 % SOLN    Sig: USE 15 DROPS THREE TIMES DAILY EXTERNALLY FOR PAIN    Dispense:  150 mL    Refill:  5   SUMAtriptan (IMITREX) 100 MG tablet    Sig: Take one prn migraine. May repeat in 2 hours if headache persists or recurs.    Dispense:  12 tablet    Refill:  5      Follow-up: Return in about 6 months (around 12/14/2023) for a follow-up visit.  Sonda Primes, MD

## 2023-06-16 ENCOUNTER — Other Ambulatory Visit: Payer: Self-pay | Admitting: Internal Medicine

## 2023-06-24 ENCOUNTER — Other Ambulatory Visit (HOSPITAL_BASED_OUTPATIENT_CLINIC_OR_DEPARTMENT_OTHER): Payer: Self-pay | Admitting: Cardiovascular Disease

## 2023-06-24 DIAGNOSIS — H04123 Dry eye syndrome of bilateral lacrimal glands: Secondary | ICD-10-CM | POA: Diagnosis not present

## 2023-06-24 DIAGNOSIS — H43812 Vitreous degeneration, left eye: Secondary | ICD-10-CM | POA: Diagnosis not present

## 2023-06-29 ENCOUNTER — Other Ambulatory Visit (HOSPITAL_COMMUNITY): Payer: Self-pay

## 2023-06-29 ENCOUNTER — Telehealth: Payer: Self-pay | Admitting: Pharmacy Technician

## 2023-06-29 NOTE — Telephone Encounter (Signed)
Pharmacy Patient Advocate Encounter   Received notification from CoverMyMeds that prior authorization for Diclofenac Sodium 1.5% solution is required/requested.   Insurance verification completed.   The patient is insured through [facility] .   Per test claim:  DICLOFENAC GEL 1%;MELOXICAM TABS;NABUMETONE;CELECOXIB;ETODOLAC is preferred by the insurance.  If suggested medication is appropriate, Please send in a new RX and discontinue this one. If not, please advise as to why it's not appropriate so that we may request a Prior Authorization.

## 2023-08-02 DIAGNOSIS — H1131 Conjunctival hemorrhage, right eye: Secondary | ICD-10-CM | POA: Diagnosis not present

## 2023-08-15 ENCOUNTER — Ambulatory Visit
Admission: RE | Admit: 2023-08-15 | Discharge: 2023-08-15 | Disposition: A | Discharge status: Home / Self Care | Payer: Medicare Other | Source: Ambulatory Visit | Attending: Internal Medicine | Admitting: Internal Medicine

## 2023-08-15 DIAGNOSIS — Z1231 Encounter for screening mammogram for malignant neoplasm of breast: Secondary | ICD-10-CM

## 2023-08-15 DIAGNOSIS — Z0000 Encounter for general adult medical examination without abnormal findings: Secondary | ICD-10-CM

## 2023-08-17 DIAGNOSIS — W19XXXA Unspecified fall, initial encounter: Secondary | ICD-10-CM

## 2023-08-17 HISTORY — DX: Unspecified fall, initial encounter: W19.XXXA

## 2023-09-03 ENCOUNTER — Other Ambulatory Visit: Payer: Self-pay | Admitting: Cardiovascular Disease

## 2023-09-13 DIAGNOSIS — H16223 Keratoconjunctivitis sicca, not specified as Sjogren's, bilateral: Secondary | ICD-10-CM | POA: Diagnosis not present

## 2023-09-13 DIAGNOSIS — H52223 Regular astigmatism, bilateral: Secondary | ICD-10-CM | POA: Diagnosis not present

## 2023-09-13 DIAGNOSIS — H524 Presbyopia: Secondary | ICD-10-CM | POA: Diagnosis not present

## 2023-09-13 DIAGNOSIS — Z961 Presence of intraocular lens: Secondary | ICD-10-CM | POA: Diagnosis not present

## 2023-09-15 ENCOUNTER — Encounter: Payer: Self-pay | Admitting: Internal Medicine

## 2023-09-15 ENCOUNTER — Ambulatory Visit: Payer: Medicare Other | Admitting: Internal Medicine

## 2023-09-15 VITALS — BP 118/70 | HR 85 | Temp 98.3°F | Ht 64.0 in | Wt 176.0 lb

## 2023-09-15 DIAGNOSIS — G4489 Other headache syndrome: Secondary | ICD-10-CM | POA: Diagnosis not present

## 2023-09-15 DIAGNOSIS — S43431S Superior glenoid labrum lesion of right shoulder, sequela: Secondary | ICD-10-CM | POA: Diagnosis not present

## 2023-09-15 DIAGNOSIS — R7401 Elevation of levels of liver transaminase levels: Secondary | ICD-10-CM

## 2023-09-15 DIAGNOSIS — I10 Essential (primary) hypertension: Secondary | ICD-10-CM | POA: Diagnosis not present

## 2023-09-15 MED ORDER — TELMISARTAN-HCTZ 80-25 MG PO TABS: 1.0000 | ORAL_TABLET | Freq: Every day | ORAL | 3 refills | Status: AC

## 2023-09-15 MED ORDER — AMLODIPINE BESYLATE 5 MG PO TABS: 5.0000 mg | ORAL_TABLET | Freq: Every day | ORAL | 3 refills | Status: AC

## 2023-09-15 MED ORDER — OXYCODONE HCL 5 MG PO TABS: 5.0000 mg | ORAL_TABLET | Freq: Three times a day (TID) | ORAL | 0 refills | Status: DC | PRN

## 2023-09-15 MED ORDER — DICLOFENAC SODIUM 1.5 % EX SOLN: CUTANEOUS | 5 refills | Status: AC

## 2023-09-15 NOTE — Progress Notes (Signed)
Subjective:  Patient ID: Karina Newman, female    DOB: Aug 03, 1956  Age: 68 y.o. MRN: [id number]  CC: Medical Management of Chronic Issues (6 mnth f/u)   HPI Karina Newman presents for HTN, chronic pain, HAs C/o hearing pulse in the R head x 2 d. Now is on steroids for neck and shoulder  Outpatient Medications Prior to Visit  Medication Sig Dispense Refill   carvedilol (COREG) 12.5 MG tablet TAKE 1 TABLET BY MOUTH 2 TIMES DAILY. 180 tablet 3   cetirizine (ZYRTEC ALLERGY) 10 MG tablet Take 1 tablet (10 mg total) by mouth daily. 30 tablet 5   Cholecalciferol 50 MCG (2000 UT) CAPS Take by mouth.     Cyanocobalamin (VITAMIN B-12) 500 MCG SUBL 1 sl qd 100 tablet 5   cycloSPORINE (RESTASIS) 0.05 % ophthalmic emulsion Place 1 drop into both eyes 2 (two) times daily.     dicyclomine (BENTYL) 10 MG capsule TAKE 1 CAPSULE (10 MG TOTAL) BY MOUTH EVERY 8 (EIGHT) HOURS AS NEEDED FOR SPASMS. 270 capsule 3   Difluprednate 0.05 % EMUL Apply 1 drop to eye 4 (four) times daily.     diphenhydrAMINE (BENADRYL) 25 MG tablet Take 25 mg by mouth every 6 (six) hours as needed for allergies.     doxazosin (CARDURA) 4 MG tablet TAKE 1 TABLET BY MOUTH EVERY DAY 90 tablet 1   DULoxetine (CYMBALTA) 20 MG capsule TAKE 1 CAPSULE BY MOUTH EVERY DAY 90 capsule 1   fluticasone (FLONASE) 50 MCG/ACT nasal spray Place 2 sprays into both nostrils daily. 16 g 5   gabapentin (NEURONTIN) 100 MG capsule TAKE 1 CAPSULE BY MOUTH THREE TIMES A DAY AS NEEDED 90 capsule 3   ipratropium (ATROVENT) 0.06 % nasal spray Place 1 spray into both nostrils 3 (three) times daily as needed for rhinitis. 15 mL 5   LORazepam (ATIVAN) 0.5 MG tablet Take 1 tablet (0.5 mg total) by mouth 2 (two) times daily as needed for anxiety or sleep. 60 tablet 2   omeprazole (PRILOSEC) 40 MG capsule TAKE 1 CAPSULE BY MOUTH EVERY DAY IN THE MORNING AND AT BEDTIME 180 capsule 3   Potassium Chloride ER 20 MEQ TBCR Take 1 tablet (20 mEq total) by mouth daily. 90  tablet 3   prednisoLONE acetate (PRED FORTE) 1 % ophthalmic suspension Place 1 drop into the right eye daily.     PROLENSA 0.07 % SOLN Apply to eye.     pseudoephedrine (SUDAFED) 120 MG 12 hr tablet Take by mouth.     sucralfate (CARAFATE) 1 g tablet TAKE 1 TABLET BY MOUTH 3 TIMES A DAY BETWEEN MEALS 270 tablet 3   SUMAtriptan (IMITREX) 100 MG tablet TAKE ONE PRN MIGRAINE. MAY REPEAT IN 2 HOURS IF HEADACHE PERSISTS OR RECURS. 12 tablet 5   traZODone (DESYREL) 50 MG tablet TAKE 1 TABLET BY MOUTH EVERYDAY AT BEDTIME 90 tablet 3   Vitamin D, Cholecalciferol, 1000 units CAPS Take 2,000 Units by mouth daily.     amLODipine (NORVASC) 5 MG tablet TAKE 1 TABLET (5 MG TOTAL) BY MOUTH DAILY. 30 tablet 0   Diclofenac Sodium 1.5 % SOLN USE 15 DROPS THREE TIMES DAILY EXTERNALLY FOR PAIN 150 mL 5   oxyCODONE (ROXICODONE) 5 MG immediate release tablet Take 1 tablet (5 mg total) by mouth every 8 (eight) hours as needed for severe pain (pain score 7-10). 90 tablet 0   telmisartan-hydrochlorothiazide (MICARDIS HCT) 80-25 MG tablet TAKE 1 TABLET BY MOUTH EVERY DAY 30 tablet  0   acetaminophen (TYLENOL) 500 MG tablet Take 1,000 mg by mouth every 6 (six) hours as needed for moderate pain. (Patient not taking: Reported on 03/18/2023)     cyclobenzaprine (FLEXERIL) 5 MG tablet Take 1 tablet (5 mg total) by mouth 3 (three) times daily as needed for muscle spasms. (Patient not taking: Reported on 09/15/2023) 60 tablet 2   predniSONE (DELTASONE) 5 MG tablet Take 4 tabs po x 4 days, 3  tabs po x 4 days, 2  tabs po x 4 days, 1  tab po x 4 days (Patient not taking: Reported on 09/15/2023) 40 tablet 0   No facility-administered medications prior to visit.    ROS: Review of Systems  Constitutional:  Negative for activity change, appetite change, chills, fatigue and unexpected weight change.  HENT:  Negative for congestion, mouth sores and sinus pressure.   Eyes:  Negative for visual disturbance.  Respiratory:  Negative for  cough and chest tightness.   Gastrointestinal:  Negative for abdominal pain and nausea.  Genitourinary:  Negative for difficulty urinating, frequency and vaginal pain.  Musculoskeletal:  Negative for back pain and gait problem.  Skin:  Negative for pallor and rash.  Neurological:  Positive for headaches. Negative for dizziness, tremors, weakness and numbness.  Psychiatric/Behavioral:  Negative for confusion, sleep disturbance and suicidal ideas. The patient is nervous/anxious.     Objective:  BP 118/70 (BP Location: Left Arm, Patient Position: Sitting, Cuff Size: Normal)   Pulse 85   Temp 98.3 F (36.8 C) (Oral)   Ht 5\' 4"  (1.626 m)   Wt 176 lb (79.8 kg)   SpO2 97%   BMI 30.21 kg/m   BP Readings from Last 3 Encounters:  09/15/23 118/70  06/15/23 118/80  06/01/23 (!) 154/77    Wt Readings from Last 3 Encounters:  09/15/23 176 lb (79.8 kg)  06/15/23 177 lb (80.3 kg)  06/14/23 176 lb (79.8 kg)    Physical Exam Constitutional:      General: She is not in acute distress.    Appearance: She is well-developed. She is obese.  HENT:     Head: Normocephalic.     Right Ear: External ear normal.     Left Ear: External ear normal.     Nose: Nose normal.  Eyes:     General:        Right eye: No discharge.        Left eye: No discharge.     Conjunctiva/sclera: Conjunctivae normal.     Pupils: Pupils are equal, round, and reactive to light.  Neck:     Thyroid: No thyromegaly.     Vascular: No JVD.     Trachea: No tracheal deviation.  Cardiovascular:     Rate and Rhythm: Normal rate and regular rhythm.     Heart sounds: Normal heart sounds.  Pulmonary:     Effort: No respiratory distress.     Breath sounds: No stridor. No wheezing.  Abdominal:     General: Bowel sounds are normal. There is no distension.     Palpations: Abdomen is soft. There is no mass.     Tenderness: There is no abdominal tenderness. There is no guarding or rebound.  Musculoskeletal:        General: No  tenderness.     Cervical back: Normal range of motion and neck supple. No rigidity.  Lymphadenopathy:     Cervical: No cervical adenopathy.  Skin:    Findings: No erythema or rash.  Neurological:     Cranial Nerves: No cranial nerve deficit.     Motor: No abnormal muscle tone.     Coordination: Coordination normal.     Deep Tendon Reflexes: Reflexes normal.  Psychiatric:        Behavior: Behavior normal.        Thought Content: Thought content normal.        Judgment: Judgment normal.     Lab Results  Component Value Date   WBC 4.4 03/16/2023   HGB 14.4 03/16/2023   HCT 42.7 03/16/2023   PLT 354 03/16/2023   GLUCOSE 110 (H) 06/15/2023   CHOL 161 12/04/2020   TRIG 101.0 12/04/2020   HDL 40.20 12/04/2020   LDLCALC 101 (H) 12/04/2020   ALT 54 (H) 06/15/2023   AST 33 06/15/2023   NA 139 06/15/2023   K 3.8 06/15/2023   CL 101 06/15/2023   CREATININE 0.79 06/15/2023   BUN 8 06/15/2023   CO2 32 06/15/2023   TSH 0.65 03/01/2022   HGBA1C 6.3 06/15/2023    MM 3D SCREENING MAMMOGRAM BILATERAL BREAST Result Date: 08/19/2023 CLINICAL DATA:  Screening. EXAM: DIGITAL SCREENING BILATERAL MAMMOGRAM WITH TOMOSYNTHESIS AND CAD TECHNIQUE: Bilateral screening digital craniocaudal and mediolateral oblique mammograms were obtained. Bilateral screening digital breast tomosynthesis was performed. The images were evaluated with computer-aided detection. COMPARISON:  Previous exam(s). ACR Breast Density Category b: There are scattered areas of fibroglandular density. FINDINGS: There are no findings suspicious for malignancy. IMPRESSION: No mammographic evidence of malignancy. A result letter of this screening mammogram will be mailed directly to the patient. RECOMMENDATION: Screening mammogram in one year. (Code:SM-B-01Y) BI-RADS CATEGORY  1: Negative. Electronically Signed   By: Baird Lyons M.D.   On: 08/19/2023 13:27    Assessment & Plan:   Problem List Items Addressed This Visit     Labral  tear of shoulder   On steroids per Ortho now      HTN (hypertension), benign - Primary   Cont w/Amlodipine, Micardis HCT Monitor BP at home - nl BP per pt NAS diet Re-start meds      Relevant Medications   amLODipine (NORVASC) 5 MG tablet   telmisartan-hydrochlorothiazide (MICARDIS HCT) 80-25 MG tablet   Other Relevant Orders   Comprehensive metabolic panel   Headache   C/o hearing pulse in the R head x 2 d. Now is on steroids for neck and shoulder Worse due to po steroids PRN exedrin      Relevant Medications   amLODipine (NORVASC) 5 MG tablet   oxyCODONE (ROXICODONE) 5 MG immediate release tablet   Other Relevant Orders   Comprehensive metabolic panel   Sedimentation rate   Elevated ALT measurement   Mildly elevated ALT in July 2024 D/c Percocet Start Oxy 5 mg instead         Meds ordered this encounter  Medications   amLODipine (NORVASC) 5 MG tablet    Sig: Take 1 tablet (5 mg total) by mouth daily.    Dispense:  90 tablet    Refill:  3   Diclofenac Sodium 1.5 % SOLN    Sig: USE 15 DROPS THREE TIMES DAILY EXTERNALLY FOR PAIN    Dispense:  150 mL    Refill:  5   oxyCODONE (ROXICODONE) 5 MG immediate release tablet    Sig: Take 1 tablet (5 mg total) by mouth every 8 (eight) hours as needed for severe pain (pain score 7-10).    Dispense:  90 tablet    Refill:  0   telmisartan-hydrochlorothiazide (MICARDIS HCT) 80-25 MG tablet    Sig: Take 1 tablet by mouth daily.    Dispense:  90 tablet    Refill:  3      Follow-up: Return in about 3 months (around 12/14/2023) for a follow-up visit.  Sonda Primes, MD

## 2023-09-15 NOTE — Assessment & Plan Note (Signed)
Mildly elevated ALT in July 2024 D/c Percocet Start Oxy 5 mg instead

## 2023-09-15 NOTE — Patient Instructions (Signed)
A1 milk

## 2023-09-15 NOTE — Assessment & Plan Note (Signed)
Cont w/Amlodipine, Micardis HCT Monitor BP at home - nl BP per pt NAS diet Re-start meds

## 2023-09-15 NOTE — Assessment & Plan Note (Signed)
On steroids per Ortho now

## 2023-09-15 NOTE — Assessment & Plan Note (Signed)
C/o hearing pulse in the R head x 2 d. Now is on steroids for neck and shoulder Worse due to po steroids PRN exedrin

## 2023-09-21 ENCOUNTER — Other Ambulatory Visit: Payer: Self-pay | Admitting: Internal Medicine

## 2023-09-23 ENCOUNTER — Other Ambulatory Visit: Payer: Self-pay

## 2023-09-23 ENCOUNTER — Encounter: Payer: Self-pay | Admitting: Internal Medicine

## 2023-09-23 ENCOUNTER — Ambulatory Visit: Payer: Medicare Other | Admitting: Internal Medicine

## 2023-09-23 VITALS — BP 132/76 | HR 74 | Temp 98.0°F

## 2023-09-23 DIAGNOSIS — K219 Gastro-esophageal reflux disease without esophagitis: Secondary | ICD-10-CM

## 2023-09-23 DIAGNOSIS — J3089 Other allergic rhinitis: Secondary | ICD-10-CM

## 2023-09-23 MED ORDER — LEVOCETIRIZINE DIHYDROCHLORIDE 5 MG PO TABS: 5.0000 mg | ORAL_TABLET | Freq: Every evening | ORAL | 5 refills | Status: DC

## 2023-09-23 MED ORDER — IPRATROPIUM BROMIDE 0.06 % NA SOLN: 1.0000 | Freq: Three times a day (TID) | NASAL | 5 refills | Status: DC | PRN

## 2023-09-23 NOTE — Patient Instructions (Addendum)
 Allergic Rhinitis: - Positive skin test 12/2022: mold, cat, cockroach.  - Use nasal saline rinses before nose sprays such as with Neilmed Sinus Rinse.  Use distilled water.   - Use Nasonex or Nasacort  2 sprays each nostril daily. Aim upward and outward. This replaces Flonase .  - Use Ipratroprium 1-2 sprays up to three times daily as needed for runny nose or drainage. Aim upward and outward. - Use Xyzal  5 mg daily. This replaces Zyrtec .   - Consider allergy  shots as long term control of your symptoms by teaching your immune system to be more tolerant of your allergy  triggers  GERD -Continue omperazole 40mg  daily on an empty stomach.  Eat a snack or breakfast about 45 minutes later.  -Avoid lying down for at least two hours after a meal or after drinking acidic beverages, like soda, or other caffeinated beverages. This can help to prevent stomach contents from flowing back into the esophagus. -Keep your head elevated while you sleep. Using an extra pillow or two can also help to prevent reflux. -Eat smaller and more frequent meals each day instead of a few large meals. This promotes digestion and can aid in preventing heartburn. -Wear loose-fitting clothes to ease pressure on the stomach, which can worsen heartburn and reflux. -Reduce excess weight around the midsection. This can ease pressure on the stomach. Such pressure can force some stomach contents back up the esophagus.

## 2023-09-23 NOTE — Progress Notes (Signed)
 FOLLOW UP Date of Service/Encounter:  09/23/23   Subjective:  Karina Newman (DOB: Jan 11, 1956) is a 68 y.o. female who [facility] for follow up for allergic rhinitis and GERD.   History obtained from: chart review and patient. Last visit was with me on 03/18/2023 and at the time, reported doing better on Flonase /Zyrtec ; had to stop Azelastine  due to numbness in nose.  Also on PPI for GERD.  Since last visit, reports on and off congestion, runny nose, itching.  Worse especially when she is at daughter's house with a cat.  Taking Ipratropium daily.  Stopped Flonase  as she does not like the taste/scent and Zyrtec  upset her stomach.   Does have reflux and recently worse with heartburn.  Has been eating a lot of fried foods.  Also has had a lot of loss of family members over the years that is worsening the situation with stress and eating.  Taking omeprazole  daily.   Past Medical History: Past Medical History:  Diagnosis Date   Abnormal cervical Pap smear with positive HPV DNA test 01/2014,01/2017   2015 Normal cytology with positive high-risk HPV 18/45. Colposcopy negative with negative ECC.  2018 normal cytology with positive high-risk HPV 18/45   Anemia    Apnea 05/17/2018   Autoimmune gastritis    Fatty liver    Gastritis    Heart murmur    Hypertension    Hyperthyroidism    Inappropriate sinus tachycardia (HCC) 05/17/2018   Internal hemorrhoids    Osteoarthritis    Right shoulder pain 2001   chronic pain   Thyroid  disease    Hyperthyroid. Was on PTU (Dr Viviana in [place]) - stopped in 2013, stable   Tubular adenoma of colon     Objective:  BP 132/76 (BP Location: Left Arm, Patient Position: Sitting, Cuff Size: Normal)   Pulse 74   Temp 98 F (36.7 C) (Temporal)   SpO2 98%  There is no height or weight on file to calculate BMI. Physical Exam: GEN: alert, well developed HEENT: clear conjunctiva, TM grey and translucent, nose with mild  inferior turbinate hypertrophy, pink nasal mucosa, slight clear rhinorrhea, + cobblestoning HEART: regular rate and rhythm, no murmur LUNGS: clear to auscultation bilaterally, no coughing, unlabored respiration SKIN: no rashes or lesions  Assessment:   1. Perennial allergic rhinitis   2. Gastroesophageal reflux disease without esophagitis      Plan/Recommendations:  Allergic Rhinitis: - Uncontrolled, discussed switching INCS and OAH; Unable to do azelastine  in the past due to nasal numbness; Flonase  due to taste/scent, Zyrtec  due to upsetting stomach.  - Positive skin test 12/2022: mold, cat, cockroach.  - Use nasal saline rinses before nose sprays such as with Neilmed Sinus Rinse.  Use distilled water.   - Use Nasonex or Nasacort  2 sprays each nostril daily. Aim upward and outward. This replaces Flonase .  - Use Ipratroprium 1-2 sprays up to three times daily as needed for runny nose or drainage. Aim upward and outward. - Use Xyzal  5 mg daily. This replaces Zyrtec .   - Consider allergy  shots as long term control of your symptoms by teaching your immune system to be more tolerant of your allergy  triggers  GERD - Uncontrolled, discussed dietary avoidance measures and if still uncontrolled follow up with GI.  -Continue omperazole 40mg  daily on an empty stomach.  Eat a snack or breakfast about 45 minutes later.  -Avoid lying down for at least two hours after a meal  or after drinking acidic beverages, like soda, or other caffeinated beverages. This can help to prevent stomach contents from flowing back into the esophagus. -Keep your head elevated while you sleep. Using an extra pillow or two can also help to prevent reflux. -Eat smaller and more frequent meals each day instead of a few large meals. This promotes digestion and can aid in preventing heartburn. -Wear loose-fitting clothes to ease pressure on the stomach, which can worsen heartburn and reflux. -Reduce excess weight around the  midsection. This can ease pressure on the stomach. Such pressure can force some stomach contents back up the esophagus.     Return in about 6 months (around 03/22/2024).  [name], MD [facility] 

## 2023-09-30 ENCOUNTER — Other Ambulatory Visit: Payer: Self-pay | Admitting: Internal Medicine

## 2023-09-30 NOTE — Telephone Encounter (Signed)
Copied from CRM [id number]. Topic: Clinical - Medication Refill >> Sep 30, 2023  9:15 AM Marica Otter wrote: Most Recent Primary Care Visit:  Provider: Tresa Garter  Department: [facility]  Visit Type: OFFICE VISIT  Date: 09/15/2023  Medication: valACYclovir (VALTREX) 500 MG tablet  Has the patient contacted their pharmacy? Yes, Old prescription needs new one (Agent: If no, request that the patient contact the pharmacy for the refill. If patient does not wish to contact the pharmacy document the reason why and proceed with request.) (Agent: If yes, when and what did the pharmacy advise?)  Is this the correct pharmacy for this prescription? Yes If no, delete pharmacy and type the correct one.  This is the patient's preferred pharmacy:  [id number] - [place], [place] - [address] AT [facility] [address] [place] [address] Phone: [phone] Fax: [phone]   Has the prescription been filled recently? No  Is the patient out of the medication? Yes  Has the patient been seen for an appointment in the last year OR does the patient have an upcoming appointment? Yes  Can we respond through MyChart? No  Agent: Please be advised that Rx refills may take up to 3 business days. We ask that you follow-up with your pharmacy.

## 2023-10-05 ENCOUNTER — Other Ambulatory Visit: Payer: Self-pay

## 2023-10-05 ENCOUNTER — Telehealth: Payer: Self-pay

## 2023-10-05 ENCOUNTER — Other Ambulatory Visit: Payer: Self-pay | Admitting: Internal Medicine

## 2023-10-05 MED ORDER — VALACYCLOVIR HCL 500 MG PO TABS: 500.0000 mg | ORAL_TABLET | Freq: Two times a day (BID) | ORAL | 1 refills | Status: DC

## 2023-10-05 NOTE — Telephone Encounter (Signed)
Copied from CRM [id number]. Topic: Clinical - Prescription Issue >> Oct 05, 2023 12:48 PM Gurney Maxin H wrote: Reason for CRM: Patient following up on refill request for her valACYclovir (VALTREX) 500 MG tablet submitted on 2/14, patient states she's having an outbreak and needs the medication. Advised patient request was sent to provider this morning and patient states she needs medication sent in today if possible.  Skarlet [phone]

## 2023-10-05 NOTE — Telephone Encounter (Signed)
 Medication has been sent into pt's pharmacy.

## 2023-10-07 ENCOUNTER — Other Ambulatory Visit (INDEPENDENT_AMBULATORY_CARE_PROVIDER_SITE_OTHER): Payer: Medicare Other

## 2023-10-07 DIAGNOSIS — I10 Essential (primary) hypertension: Secondary | ICD-10-CM

## 2023-10-07 DIAGNOSIS — G4489 Other headache syndrome: Secondary | ICD-10-CM

## 2023-10-07 LAB — COMPREHENSIVE METABOLIC PANEL
ALT: 44 U/L — ABNORMAL HIGH (ref 0–35)
AST: 20 U/L (ref 0–37)
Albumin: 4.1 g/dL (ref 3.5–5.2)
Alkaline Phosphatase: 73 U/L (ref 39–117)
BUN: 14 mg/dL (ref 6–23)
CO2: 33 meq/L — ABNORMAL HIGH (ref 19–32)
Calcium: 9.6 mg/dL (ref 8.4–10.5)
Chloride: 101 meq/L (ref 96–112)
Creatinine, Ser: 0.8 mg/dL (ref 0.40–1.20)
GFR: 76.66 mL/min (ref >60.00)
Glucose, Bld: 147 mg/dL — ABNORMAL HIGH (ref 70–99)
Potassium: 4.4 meq/L (ref 3.5–5.1)
Sodium: 142 meq/L (ref 135–145)
Total Bilirubin: 0.5 mg/dL (ref 0.2–1.2)
Total Protein: 7.6 g/dL (ref 6.0–8.3)

## 2023-10-07 LAB — SEDIMENTATION RATE: Sed Rate: 45 mm/h — ABNORMAL HIGH (ref 0–30)

## 2023-10-10 ENCOUNTER — Encounter: Payer: Self-pay | Admitting: Internal Medicine

## 2023-11-14 ENCOUNTER — Other Ambulatory Visit: Payer: Self-pay | Admitting: Internal Medicine

## 2023-11-16 ENCOUNTER — Encounter: Payer: Self-pay | Admitting: Gastroenterology

## 2023-11-16 ENCOUNTER — Ambulatory Visit: Payer: Medicare Other | Admitting: Gastroenterology

## 2023-11-16 VITALS — BP 130/82 | HR 71 | Ht 64.0 in | Wt 170.0 lb

## 2023-11-16 DIAGNOSIS — Z860100 Personal history of colon polyps, unspecified: Secondary | ICD-10-CM | POA: Insufficient documentation

## 2023-11-16 DIAGNOSIS — Z860101 Personal history of adenomatous and serrated colon polyps: Secondary | ICD-10-CM

## 2023-11-16 DIAGNOSIS — R6881 Early satiety: Secondary | ICD-10-CM | POA: Insufficient documentation

## 2023-11-16 DIAGNOSIS — K2940 Chronic atrophic gastritis without bleeding: Secondary | ICD-10-CM | POA: Diagnosis not present

## 2023-11-16 MED ORDER — NA SULFATE-K SULFATE-MG SULF 17.5-3.13-1.6 GM/177ML PO SOLN
1.0000 | Freq: Once | ORAL | 0 refills | Status: AC
Start: 2023-11-16 — End: 2023-11-16

## 2023-11-16 NOTE — Progress Notes (Signed)
 11/16/2023 Karina Newman [id number] [birth year]   HISTORY OF PRESENT ILLNESS: This is a 68 year old female is a patient of Dr. Lanetta Inch.  She has a history of autoimmune gastritis on EGD and biopsies as well as positive parietal cell antibody, history of B12 deficiency.  She had a 6 mm subepithelial nodule noted in the gastric antrum and had EUS in July 2023.  This is found to be a lipoma.  No follow-up needed for that reason.  She is here today as she is overdue for recall colonoscopy would like to schedule that and possibly upper endoscopy if needed as well.  Colonoscopy 05/06/16 - normal colon and ileum, small adenoma, hemorrhoids. Recall colonoscopy in 2024.   Patient continues to complain of chronic complaints including dizziness, epigastric abdominal pain, etc.  She says that the symptoms have been present for years.  She takes omeprazole 40 mg twice daily and Carafate which she usually uses twice daily as well.  She does notice a new symptom that she describes as chest tightness and feels like there is "not enough room for her food", like she gets full very quickly.  No dysphagia.  She reports occasional constipation, but nothing that requires treatment.  No rectal bleeding.   She has had an extensive work-up in the past to include the following:  Workup as follows: CT scan abdomen for RLQ pain - 09/09/16, oral contrast only - hepatic steatosis, small inguinal hernias, small umbilical hernia EGD 05/06/16 - normal EGD, biopsies taken for H pylori - showing "marked chronic gastritis" , negative for HP Colonoscopy 05/06/16 - normal colon and ileum, small adenoma, hemorrhoids. Recall colonoscopy in 2024. [place] 03/04/16 - no gallstones, fatty liver EGD 05/2018 showed 1 cm hiatal hernia and mildly atrophic gastritis; gastric biopsies c/w autoimmune gastritis.  Repeat EGD recommended in 3-5 years.   Labs show negative IF AB, positive parietal cell AB, and B12 deficiency. Diagnosed with  autoimmune gastritis.  EUS/EGD 02/2022:  EGD Impression: - No gross lesions in esophagus. Z- line irregular, 40 cm from the incisors. - Atrophic appearing gastritis ( previously known and monitored) . - Extrinsic impression vs SEL in the gastric antrum ( greater curvature) . - Erythematous duodenopathy. Biopsied.  EUS Impression: - An intramural ( subepithelial) lesion was found in the antrum of the stomach. The lesion appeared to originate from within the submucosa ( Layer 3) . Tissue has not been obtained. However, the endosonographic appearance is consistent with a lipoma. - Endosonographic images of the stomach were unremarkable. - No malignant- appearing lymph nodes were visualized in the perigastric region.   EGD 04/28/21: - A sliding 1 cm hiatal hernia was present. - The exam of the esophagus was otherwise normal. - A single 6 mm subepithelial nodule was found in the gastric antrum. Seemed hard to probing with forceps, not overtly a lipoma. Bite on bite biopsies were taken with a cold forceps for histology. - Diffuse atrophic mucosa was found in the entire examined stomach. Mild focal erythema noted in the antrum. - The exam of the stomach was otherwise normal. - Biopsies were taken with a cold forceps in the gastric fundus, in the gastric body, at the incisura and in the gastric antrum for histology - rule out H pylori, gastric intestinal metplasia given history of atrophic gastritis. - The duodenal bulb and second portion of the duodenum were normal.   1. Surgical [P], gastric antrum - GASTRIC ANTRAL MUCOSA WITH MILD CHRONIC GASTRITIS - NEGATIVE FOR INTESTINAL METAPLASIA  OR DYSPLASIA - WARTHIN STARRY STAIN IS NEGATIVE FOR HELICOBACTER PYLORI 2. Surgical [P], gastric body - GASTRIC ANTRAL TYPE MUCOSA SHOWING PATCHY CHRONIC GASTRITIS WITH ATROPHY AND ECL CELL HYPERPLASIA, CONSISTENT WITH PATIENT'S HISTORY OF AUTOIMMUNE GASTRITIS - NEGATIVE FOR INTESTINAL METAPLASIA OR DYSPLASIA -  WARTHIN-STARRY STAIN IS NEGATIVE FOR HELICOBACTER PYLORI 3. Surgical [P], fundus - GASTRIC ANTRAL TYPE MUCOSA SHOWING PATCHY CHRONIC GASTRITIS WITH ATROPHY AND ECL CELL HYPERPLASIA, CONSISTENT WITH PATIENT'S HISTORY OF AUTOIMMUNE GASTRITIS - NEGATIVE FOR INTESTINAL METAPLASIA OR DYSPLASIA - WARTHIN-STARRY STAIN IS NEGATIVE FOR HELICOBACTER PYLORI 4. Surgical [P], gastric antrum - GASTRIC ANTRAL MUCOSA WITH MILD CHRONIC GASTRITIS - NEGATIVE FOR INTESTINAL METAPLASIA OR DYSPLASIA - WARTHIN STARRY STAIN IS NEGATIVE FOR HELICOBACTER PYLORI     [place] RUQ 05/11/21 - IMPRESSION: 1. Negative for gallstones. 2. Echogenic liver consistent with hepatic steatosis. Probable focal fat sparing near gallbladder fossa     Past Medical History:  Diagnosis Date   Abnormal cervical Pap smear with positive HPV DNA test 01/2014,01/2017   2015 Normal cytology with positive high-risk HPV 18/45. Colposcopy negative with negative ECC.  2018 normal cytology with positive high-risk HPV 18/45   Anemia    Apnea 05/17/2018   Autoimmune gastritis    Fatty liver    Gastritis    Heart murmur    Hypertension    Hyperthyroidism    Inappropriate sinus tachycardia (HCC) 05/17/2018   Internal hemorrhoids    Osteoarthritis    Right shoulder pain 2001   chronic pain   Thyroid disease    Hyperthyroid. Was on PTU (Dr Willaim Bane in [place]) - stopped in 2013, stable   Tubular adenoma of colon    Past Surgical History:  Procedure Laterality Date   BIOPSY  03/11/2022   Procedure: BIOPSY;  Surgeon: Meridee Score Netty Starring., MD;  Location: [facility] ENDOSCOPY;  Service: Gastroenterology;;   BREAST CYST ASPIRATION Left 2012   CATARACT EXTRACTION Right 06/15/2022   CATARACT EXTRACTION Left 06/28/2022   CESAREAN SECTION     x 4    COLONOSCOPY     ESOPHAGOGASTRODUODENOSCOPY (EGD) WITH PROPOFOL N/A 03/11/2022   Procedure: ESOPHAGOGASTRODUODENOSCOPY (EGD) WITH PROPOFOL;  Surgeon: Lemar Lofty., MD;  Location: [facility] ENDOSCOPY;  Service:  Gastroenterology;  Laterality: N/A;   EUS N/A 03/11/2022   Procedure: UPPER ENDOSCOPIC ULTRASOUND (EUS) RADIAL;  Surgeon: Lemar Lofty., MD;  Location: [facility] ENDOSCOPY;  Service: Gastroenterology;  Laterality: N/A;   HAND SURGERY Left    SHOULDER SURGERY Right 2002   SHOULDER SURGERY Left 2014   TUBAL LIGATION     UPPER GASTROINTESTINAL ENDOSCOPY     Vitrectomy surgery Right 03/21/2023   Also cyst removal/ Lt eye done 04/21/2023    reports that she has never smoked. She has never been exposed to tobacco smoke. She has never used smokeless tobacco. She reports current alcohol use. She reports that she does not use drugs. family history includes Diabetes in her brother and paternal grandmother; Hypertension in her brother, brother, mother, sister, sister, and son; Kidney disease in her mother and sister; Lung cancer in her father; Sleep apnea in her son. Allergies  Allergen Reactions   Penicillins Hives   Tramadol Anaphylaxis    Memory lapses    Iodine Other (See Comments)    Due to hyperthyroidism   Lexapro [Escitalopram]     fatigue   Lyrica [Pregabalin] Dermatitis   Nsaids     gastritis   Voltaren [Diclofenac] Hives      Outpatient Encounter Medications as of 11/16/2023  Medication Sig   amLODipine (NORVASC) 5 MG tablet Take 1 tablet (5 mg total) by mouth daily.   carvedilol (COREG) 12.5 MG tablet TAKE 1 TABLET BY MOUTH TWICE A DAY   Cholecalciferol 50 MCG (2000 UT) CAPS Take by mouth.   Cyanocobalamin (VITAMIN B-12) 500 MCG SUBL 1 sl qd   cyclobenzaprine (FLEXERIL) 5 MG tablet Take 1 tablet (5 mg total) by mouth 3 (three) times daily as needed for muscle spasms.   cycloSPORINE (RESTASIS) 0.05 % ophthalmic emulsion Place 1 drop into both eyes 2 (two) times daily.   Diclofenac Sodium 1.5 % SOLN USE 15 DROPS THREE TIMES DAILY EXTERNALLY FOR PAIN   dicyclomine (BENTYL) 10 MG capsule TAKE 1 CAPSULE (10 MG TOTAL) BY MOUTH EVERY 8 (EIGHT) HOURS AS NEEDED FOR SPASMS.    diphenhydrAMINE (BENADRYL) 25 MG tablet Take 25 mg by mouth every 6 (six) hours as needed for allergies.   doxazosin (CARDURA) 4 MG tablet TAKE 1 TABLET BY MOUTH EVERY DAY   DULoxetine (CYMBALTA) 20 MG capsule TAKE 1 CAPSULE BY MOUTH EVERY DAY   gabapentin (NEURONTIN) 100 MG capsule TAKE 1 CAPSULE BY MOUTH THREE TIMES A DAY AS NEEDED   ipratropium (ATROVENT) 0.06 % nasal spray Place 1 spray into both nostrils 3 (three) times daily as needed for rhinitis.   levocetirizine (XYZAL) 5 MG tablet Take 1 tablet (5 mg total) by mouth every evening.   LORazepam (ATIVAN) 0.5 MG tablet TAKE 1 TABLET (0.5 MG TOTAL) BY MOUTH 2 (TWO) TIMES DAILY AS NEEDED FOR ANXIETY OR SLEEP.   Na Sulfate-K Sulfate-Mg Sulfate concentrate (SUPREP) 17.5-3.13-1.6 GM/177ML SOLN Take 1 kit (354 mLs total) by mouth once for 1 dose.   omeprazole (PRILOSEC) 40 MG capsule TAKE 1 CAPSULE BY MOUTH EVERY DAY IN THE MORNING AND AT BEDTIME   oxyCODONE (ROXICODONE) 5 MG immediate release tablet Take 1 tablet (5 mg total) by mouth every 8 (eight) hours as needed for severe pain (pain score 7-10).   Potassium Chloride ER 20 MEQ TBCR Take 1 tablet (20 mEq total) by mouth daily.   pseudoephedrine (SUDAFED) 120 MG 12 hr tablet Take by mouth.   sucralfate (CARAFATE) 1 g tablet TAKE 1 TABLET BY MOUTH 3 TIMES A DAY BETWEEN MEALS   SUMAtriptan (IMITREX) 100 MG tablet TAKE ONE PRN MIGRAINE. MAY REPEAT IN 2 HOURS IF HEADACHE PERSISTS OR RECURS.   telmisartan-hydrochlorothiazide (MICARDIS HCT) 80-25 MG tablet Take 1 tablet by mouth daily.   traZODone (DESYREL) 50 MG tablet TAKE 1 TABLET BY MOUTH EVERYDAY AT BEDTIME   valACYclovir (VALTREX) 500 MG tablet Take 1 tablet (500 mg total) by mouth 2 (two) times daily.   Vitamin D, Cholecalciferol, 1000 units CAPS Take 2,000 Units by mouth daily.   [DISCONTINUED] acetaminophen (TYLENOL) 500 MG tablet Take 1,000 mg by mouth every 6 (six) hours as needed for moderate pain. (Patient not taking: Reported on  03/18/2023)   [DISCONTINUED] Difluprednate 0.05 % EMUL Apply 1 drop to eye 4 (four) times daily.   [DISCONTINUED] prednisoLONE acetate (PRED FORTE) 1 % ophthalmic suspension Place 1 drop into the right eye daily. (Patient not taking: Reported on 09/23/2023)   [DISCONTINUED] predniSONE (DELTASONE) 5 MG tablet Take 4 tabs po x 4 days, 3  tabs po x 4 days, 2  tabs po x 4 days, 1  tab po x 4 days   [DISCONTINUED] PROLENSA 0.07 % SOLN Apply to eye. (Patient not taking: Reported on 09/23/2023)   No facility-administered encounter medications on file as  of 11/16/2023.    REVIEW OF SYSTEMS  : All other systems reviewed and negative except where noted in the History of Present Illness.   PHYSICAL EXAM: BP 130/82 (Cuff Size: Normal)   Pulse 71   Ht 5\' 4"  (1.626 m)   Wt 170 lb (77.1 kg) Comment: per patient  BMI 29.18 kg/m  General: Well developed female in no acute distress Head: Normocephalic and atraumatic Eyes:  Sclerae anicteric, conjunctiva pink. Ears: Normal auditory acuity Lungs: Clear throughout to auscultation; no W/R/R. Heart: Regular rate and rhythm; no M/R/G. Abdomen: Soft, non-distended.  BS present.  Mild diffuse TTP> in the epigastrium. Rectal:  Will be done at the time of colonoscopy. Musculoskeletal: Symmetrical with no gross deformities  Skin: No lesions on visible extremities Extremities: No edema  Neurological: Alert oriented x 4, grossly non-focal Psychological:  Alert and cooperative. Normal mood and affect  ASSESSMENT AND PLAN: *History of colon polyps:  One small adenoma in 04/2016.  Due for recall 04/2023. *Autoimmune gastritis: Has ongoing, chronic complaints of queasiness, epigastric abdominal pain, etc.  Does describe a new symptom of chest tightness after eating and feeling like she cannot eat much/early satiety.  Last EGD was in July 2023 when she also had EUS with Dr. Meridee Score.  Will plan to repeat EGD with her upcoming colonoscopy.  **Will schedule both EGD and  colonoscopy with Dr. Adela Lank.  The risks, benefits, and alternatives to EGD and colonoscopy were discussed with the patient and she consents to proceed.      CC:  Plotnikov, Georgina Quint, MD

## 2023-11-16 NOTE — Progress Notes (Signed)
 Agree with assessment and plan as outlined.

## 2023-11-16 NOTE — Progress Notes (Unsigned)
 Office Visit Note  Patient: Karina Newman             Date of Birth: 1956/07/20           MRN: [id number]             PCP: Genia Kettering, MD Referring: Genia Kettering, MD Visit Date: 11/30/2023 Occupation: @GUAROCC @  Subjective:  Intermittent left knee joint pain   History of Present Illness: Karina Newman is a 68 y.o. female with history of positive ANA and osteoarthritis.  Patient reports that she was recently evaluated at [facility] orthopedics in March 2025.  Patient states that at that time she was given 2 IM injections to help alleviate inflammation at that time.  She was experiencing neck pain, right shoulder pain, and discomfort in both knee joints.  Patient recently completed physical therapy which helped to alleviate her right shoulder and neck pain.  Patient states she still continues to have occasional discomfort in her left knee but her right knee has improved.  Patient has been using Pennsaid topically as needed for pain relief.  Patient would like to reapply for viscosupplementation for both knees.   Activities of Daily Living:  Patient reports morning stiffness for 15-20 minutes  Patient Reports nocturnal pain.  Difficulty dressing/grooming: Denies Difficulty climbing stairs: Reports Difficulty getting out of chair: Denies Difficulty using hands for taps, buttons, cutlery, and/or writing: Reports  Review of Systems  Constitutional:  Positive for fatigue.  HENT:  Positive for mouth dryness. Negative for mouth sores and nose dryness.   Eyes:  Positive for dryness. Negative for pain and visual disturbance.  Respiratory:  Negative for cough, hemoptysis, shortness of breath and difficulty breathing.   Cardiovascular:  Negative for chest pain, palpitations, hypertension and swelling in legs/feet.  Gastrointestinal:  Negative for blood in stool, constipation and diarrhea.  Endocrine: Negative for increased urination.  Genitourinary:  Negative for painful  urination.  Musculoskeletal:  Positive for joint pain, joint pain and morning stiffness. Negative for joint swelling, myalgias, muscle weakness, muscle tenderness and myalgias.  Skin:  Negative for color change, pallor, rash, hair loss, nodules/bumps, skin tightness, ulcers and sensitivity to sunlight.  Neurological:  Positive for headaches. Negative for dizziness, numbness and weakness.  Hematological:  Negative for swollen glands.  Psychiatric/Behavioral:  Positive for sleep disturbance. Negative for depressed mood. The patient is nervous/anxious.     PMFS History:  Patient Active Problem List   Diagnosis Date Noted   History of colonic polyps 11/16/2023   Autoimmune gastritis 11/16/2023   Early satiety 11/16/2023   Elevated ALT measurement 06/15/2023   Hyperglycemia 06/15/2023   Nasal congestion 11/02/2022   Laryngopharyngeal reflux 10/13/2022   Gastritis 03/01/2022   Toe pain, chronic, right 09/17/2020   Grief 07/24/2020   Dysphagia 01/02/2020   Globus sensation 01/02/2020   Thoracic back pain 12/19/2019   Sore throat 10/24/2019   Goiter 10/24/2019   Cough 10/24/2019   Neoplasm of uncertain behavior of skin 09/27/2019   Pain of right breast 06/03/2019   Fatty liver 01/23/2019   Hand pain 09/28/2018   Knee pain 09/28/2018   Inappropriate sinus tachycardia (HCC) 05/17/2018   Apnea 05/17/2018   Upper respiratory infection 09/23/2017   Fatigue 09/12/2017   Well adult exam 01/19/2017   Abdominal pain 01/19/2017   Headache 10/27/2016   Pain in right shoulder 09/16/2016   Left hip pain 09/16/2016   Low vitamin B12 level 03/16/2016   Piriformis syndrome of right side 02/04/2016  IT band syndrome 02/04/2016   RUQ abdominal pain 02/04/2016   Dysfunction of both eustachian tubes 12/11/2015   Hoarseness 12/11/2015   Acute serous otitis media of left ear 10/08/2015   Low back pain radiating to right lower extremity 09/19/2015   Allergic rhinitis 09/19/2015   Atypical chest  pain 04/07/2015   GERD (gastroesophageal reflux disease) 04/07/2015   Axillary adenitis 03/23/2015   Migraines 03/23/2015   Hives 01/29/2015   Edema 01/29/2015   Cervical disc disorder with radiculopathy of cervical region 12/11/2014   Insomnia 07/31/2014   Degenerative cervical disc 05/08/2014   Pain in joint, shoulder region 03/05/2014   Patellofemoral arthralgia of both knees 03/01/2014   Bilateral shoulder pain 10/18/2013   Sinusitis, bacterial 08/20/2013   URI, acute 08/14/2013   Hemoptysis 08/14/2013   Otitis media of left ear 08/14/2013   Labral tear of shoulder 07/03/2013   Hyperthyroidism 07/03/2013   HTN (hypertension), benign 07/03/2013   LVH (left ventricular hypertrophy) due to hypertensive disease 07/03/2013   Vitamin D deficiency 07/03/2013    Past Medical History:  Diagnosis Date   Abnormal cervical Pap smear with positive HPV DNA test 01/2014,01/2017   2015 Normal cytology with positive high-risk HPV 18/45. Colposcopy negative with negative ECC.  2018 normal cytology with positive high-risk HPV 18/45   Anemia    Apnea 05/17/2018   Autoimmune gastritis    Fatty liver    Gastritis    Heart murmur    Hypertension    Hyperthyroidism    Inappropriate sinus tachycardia (HCC) 05/17/2018   Internal hemorrhoids    Osteoarthritis    Right shoulder pain 2001   chronic pain   Thyroid disease    Hyperthyroid. Was on PTU (Dr Tanda Falter in [place]) - stopped in 2013, stable   Tubular adenoma of colon     Family History  Problem Relation Age of Onset   Kidney disease Mother        ESRD   Hypertension Mother    Lung cancer Father    Hypertension Sister    Kidney disease Sister    Hypertension Sister    Diabetes Brother    Hypertension Brother    Hypertension Brother    Diabetes Paternal Grandmother    Sleep apnea Son    Hypertension Son    Colon cancer Neg Hx    Stomach cancer Neg Hx    Esophageal cancer Neg Hx    Rectal cancer Neg Hx    Pancreatic cancer Neg Hx     Past Surgical History:  Procedure Laterality Date   BIOPSY  03/11/2022   Procedure: BIOPSY;  Surgeon: Brice Campi, Albino Alu., MD;  Location: [facility] ENDOSCOPY;  Service: Gastroenterology;;   BREAST CYST ASPIRATION Left 2012   CATARACT EXTRACTION Right 06/15/2022   CATARACT EXTRACTION Left 06/28/2022   CESAREAN SECTION     x 4    COLONOSCOPY     ESOPHAGOGASTRODUODENOSCOPY (EGD) WITH PROPOFOL N/A 03/11/2022   Procedure: ESOPHAGOGASTRODUODENOSCOPY (EGD) WITH PROPOFOL;  Surgeon: Normie Becton., MD;  Location: [facility] ENDOSCOPY;  Service: Gastroenterology;  Laterality: N/A;   EUS N/A 03/11/2022   Procedure: UPPER ENDOSCOPIC ULTRASOUND (EUS) RADIAL;  Surgeon: Normie Becton., MD;  Location: [facility] ENDOSCOPY;  Service: Gastroenterology;  Laterality: N/A;   HAND SURGERY Left    SHOULDER SURGERY Right 2002   SHOULDER SURGERY Left 2014   TUBAL LIGATION     UPPER GASTROINTESTINAL ENDOSCOPY     Vitrectomy surgery Right 03/21/2023   Also cyst removal/ Lt eye  done 04/21/2023   Social History   Social History Narrative   Live with husbands   Immunization History  Administered Date(s) Administered   Tdap 10/17/2021   Zoster Recombinant(Shingrix) 03/25/2017, 06/27/2017     Objective: Vital Signs: BP 117/78 (BP Location: Left Arm, Patient Position: Sitting, Cuff Size: Normal)   Pulse 77   Resp 14   Ht 5\' 4"  (1.626 m)   Wt 168 lb (76.2 kg)   BMI 28.84 kg/m    Physical Exam Vitals and nursing note reviewed.  Constitutional:      Appearance: She is well-developed.  HENT:     Head: Normocephalic and atraumatic.  Eyes:     Conjunctiva/sclera: Conjunctivae normal.  Cardiovascular:     Rate and Rhythm: Normal rate and regular rhythm.     Heart sounds: Normal heart sounds.  Pulmonary:     Effort: Pulmonary effort is normal.     Breath sounds: Normal breath sounds.  Abdominal:     General: Bowel sounds are normal.     Palpations: Abdomen is soft.  Musculoskeletal:     Cervical  back: Normal range of motion.  Lymphadenopathy:     Cervical: No cervical adenopathy.  Skin:    General: Skin is warm and dry.     Capillary Refill: Capillary refill takes less than 2 seconds.  Neurological:     Mental Status: She is alert and oriented to person, place, and time.  Psychiatric:        Behavior: Behavior normal.      Musculoskeletal Exam: C-spine has slightly limited range of motion with lateral rotation.  Shoulder joint has mild discomfort with range of motion.  Elbow joints, wrist joints of MCPs, PIPs, DIPs have good range of motion with no synovitis.  PIP and DIP thickening consistent with osteoarthritis of both hands.  Complete fist formation bilaterally.  Hip joints have good range of motion with no groin pain.  Knee joints have good range of motion no warmth or effusion.  Some discomfort with range of motion of the left knee.  Ankle joints have good range of motion with no tenderness or joint swelling.  CDAI Exam: CDAI Score: -- Patient Global: --; Provider Global: -- Swollen: --; Tender: -- Joint Exam 11/30/2023   No joint exam has been documented for this visit   There is currently no information documented on the homunculus. Go to the Rheumatology activity and complete the homunculus joint exam.  Investigation: No additional findings.  Imaging: No results found.  Recent Labs: Lab Results  Component Value Date   WBC 7.5 11/21/2023   HGB 13.9 11/21/2023   PLT 347.0 11/21/2023   NA 140 11/21/2023   K 3.8 11/21/2023   CL 102 11/21/2023   CO2 31 11/21/2023   GLUCOSE 115 (H) 11/21/2023   BUN 9 11/21/2023   CREATININE 0.72 11/21/2023   BILITOT 0.4 11/21/2023   ALKPHOS 78 11/21/2023   AST 22 11/21/2023   ALT 39 (H) 11/21/2023   PROT 7.9 11/21/2023   ALBUMIN 4.4 11/21/2023   CALCIUM 9.7 11/21/2023   GFRAA 78 06/20/2018    Speciality Comments: No specialty comments available.  Procedures:  No procedures performed Allergies: Penicillins,  Tramadol, Iodine, Lexapro [escitalopram], Lyrica [pregabalin], Nsaids, and Voltaren [diclofenac]   Assessment / Plan:     Visit Diagnoses: Positive ANA (antinuclear antibody) - ANA 1:40 cytoplasmic 10/21/21.  Repeat ANA was negative on July 30 was 2024.  Double-stranded DNA negative, complements normal: Lab work from 03/16/2023 was  reviewed today in the office: Double-stranded a negative, complements within normal limits, ANA negative, uric acid within normal limits, CRP within normal limits, anti-CCP negative, RF negative, and ESR within normal limits. No clinical features of systemic lupus.  She has not had any new or worsening symptoms.  Plan to obtain the following lab work today for further evaluation.  - Plan: C3 and C4, ANA, Anti-DNA antibody, double-stranded, Sedimentation rate, C-reactive protein  Pain in both hands - X-rays of both hands were consistent with osteoarthritis on 10/21/2021.  She has PIP and DIP thickening consistent with osteoarthritis of both hands.  No synovitis was noted on examination today.  She experiences intermittent arthralgias and joint stiffness due to underlying osteoarthritis but has no active inflammation at this time.  Plan to check sed rate and CRP today.   Chronic pain of both knees - X-rays 11/15/2022 consistent with moderate osteoarthritis moderate chondromalacia patella.  No radiographic progression was noted when compared to x-rays from 2021.  Patient continues to experience intermittent discomfort and stiffness in both knee joints.  She was recently evaluated at [facility] orthopedics in march 2025 at which time she was experiencing increased joint pain and joint stiffness.  She had 2 IM injections to alleviate pain and inflammation which made her symptoms more tolerable.  She has not had a recent intra-articular injection performed.  She continues have occasional discomfort in the left knee especially when climbing steps.  Patient will plan to proceed with  viscosupplementation for both knees once approved by insurance.  Chronic right shoulder pain: Under [facility] care of [facility].  Patient recently completed physical therapy which was helpful at alleviating her discomfort and improving her range of motion.  She has been using Pennsaid topically as needed for pain relief.  Primary osteoarthritis of both knees - s/p synvisc bilateral knees 11/2022-12/2022.  She intermittent discomfort and stiffness in both knee joints.  She has been using Pennsaid topically for pain relief.  Plan to reapply for viscosupplementation for both knees.  This patient is diagnosed with osteoarthritis of the knee(s).    Radiographs show evidence of joint space narrowing, osteophytes, subchondral sclerosis and/or subchondral cysts.  This patient has knee pain which interferes with functional and activities of daily living.    This patient has experienced inadequate response, adverse effects and/or intolerance with conservative treatments such as acetaminophen, NSAIDS, topical creams, physical therapy or regular exercise, knee bracing and/or weight loss.   This patient has experienced inadequate response or has a contraindication to intra articular steroid injections for at least 3 months.   This patient is not scheduled to have a total knee replacement within 6 months of starting treatment with viscosupplementation.  Trochanteric bursitis of both hips: Not currently symptomatic.  Plantar fasciitis, bilateral: Not currently symptomatic.  DDD (degenerative disc disease), cervical: Recently completed physical therapy which helped to improve her range of motion and discomfort.  Other medical conditions are listed as follows:  Vitreous floaters of right eye  Vitamin D deficiency  HTN (hypertension), benign: Blood pressure was 117/78 today in the office.  Fatty liver  Other insomnia  Hypertensive left ventricular hypertrophy, without heart failure  Tubular  adenoma of colon  Hyperthyroidism  History of gastroesophageal reflux (GERD)  Orders: Orders Placed This Encounter  Procedures   C3 and C4   ANA   Anti-DNA antibody, double-stranded   Sedimentation rate   C-reactive protein   No orders of the defined types were placed in this encounter.  Follow-Up Instructions: Return in about 6 months (around 05/31/2024) for +ANA, Osteoarthritis.   Romayne Clubs, PA-C  Note - This record has been created using Dragon software.  Chart creation errors have been sought, but may not always  have been located. Such creation errors do not reflect on  the standard of medical care.

## 2023-11-16 NOTE — Patient Instructions (Signed)
 You have been scheduled for an endoscopy and colonoscopy. Please follow the written instructions given to you at your visit today.  If you use inhalers (even only as needed), please bring them with you on the day of your procedure.  DO NOT TAKE 7 DAYS PRIOR TO TEST- Trulicity (dulaglutide) Ozempic, Wegovy (semaglutide) Mounjaro (tirzepatide) Bydureon Bcise (exanatide extended release)  DO NOT TAKE 1 DAY PRIOR TO YOUR TEST Rybelsus (semaglutide) Adlyxin (lixisenatide) Victoza (liraglutide) Byetta (exanatide) ___________________________________________________________________________  Bonita Quin will receive your bowel preparation through [facility], which ensures the lowest copay and home delivery, with outreach via text or call from an [phone] number. Please respond promptly to avoid rescheduling of your procedure. If you are interested in alternative options or have any questions regarding your prep, please contact them at [phone] ____________________________________________________________________________  Your Provider Has Sent Your Bowel Prep Regimen To [facility]   [facility] will contact you to verify your information and collect your copay, if applicable. Enjoy the comfort of your home while your prescription is mailed to you, FREE of any shipping charges.   [facility] accepts all major insurance benefits and applies discounts & coupons.  Have additional questions?   Chat: [email or web address] Call: [phone] Email: [email or web address] NCPDP: [id number]  How will [facility] contact you?  With a Welcome phone call,  a Welcome text and a checkout link in text form.  Texts you receive from [phone] Are NOT Spam.  *To set up delivery, you must complete the checkout process via link or speak to one of the patient care representatives. If [facility] is unable to reach you, your prescription may be delayed.  To avoid long hold times on the phone, you may also  utilize the secure chat feature on the [facility] website to request that they call you back for transaction completion or to expedite your concerns.  _______________________________________________________  If your blood pressure at your visit was 140/90 or greater, please contact your primary care physician to follow up on this.  _______________________________________________________  If you are age 67 or older, your body mass index should be between 23-30. Your Body mass index is 29.18 kg/m. If this is out of the aforementioned range listed, please consider follow up with your Primary Care Provider.  If you are age 46 or younger, your body mass index should be between 19-25. Your Body mass index is 29.18 kg/m. If this is out of the aformentioned range listed, please consider follow up with your Primary Care Provider.   ________________________________________________________  The [place] [facility] providers would like to encourage you to use [facility] to communicate with providers for non-urgent requests or questions.  Due to long hold times on the telephone, sending your provider a message by [facility] may be a faster and more efficient way to get a response.  Please allow 48 business hours for a response.  Please remember that this is for non-urgent requests.  _______________________________________________________

## 2023-11-21 ENCOUNTER — Ambulatory Visit (INDEPENDENT_AMBULATORY_CARE_PROVIDER_SITE_OTHER): Admitting: Internal Medicine

## 2023-11-21 ENCOUNTER — Encounter: Payer: Self-pay | Admitting: Internal Medicine

## 2023-11-21 VITALS — BP 148/80 | HR 69 | Temp 97.8°F | Ht 64.0 in | Wt 169.2 lb

## 2023-11-21 DIAGNOSIS — I889 Nonspecific lymphadenitis, unspecified: Secondary | ICD-10-CM

## 2023-11-21 DIAGNOSIS — R7989 Other specified abnormal findings of blood chemistry: Secondary | ICD-10-CM

## 2023-11-21 DIAGNOSIS — R5383 Other fatigue: Secondary | ICD-10-CM | POA: Diagnosis not present

## 2023-11-21 DIAGNOSIS — I10 Essential (primary) hypertension: Secondary | ICD-10-CM

## 2023-11-21 DIAGNOSIS — E559 Vitamin D deficiency, unspecified: Secondary | ICD-10-CM | POA: Diagnosis not present

## 2023-11-21 LAB — COMPREHENSIVE METABOLIC PANEL WITH GFR
ALT: 39 U/L — ABNORMAL HIGH (ref 0–35)
AST: 22 U/L (ref 0–37)
Albumin: 4.4 g/dL (ref 3.5–5.2)
Alkaline Phosphatase: 78 U/L (ref 39–117)
BUN: 9 mg/dL (ref 6–23)
CO2: 31 meq/L (ref 19–32)
Calcium: 9.7 mg/dL (ref 8.4–10.5)
Chloride: 102 meq/L (ref 96–112)
Creatinine, Ser: 0.72 mg/dL (ref 0.40–1.20)
GFR: 86.92 mL/min (ref >60.00)
Glucose, Bld: 115 mg/dL — ABNORMAL HIGH (ref 70–99)
Potassium: 3.8 meq/L (ref 3.5–5.1)
Sodium: 140 meq/L (ref 135–145)
Total Bilirubin: 0.4 mg/dL (ref 0.2–1.2)
Total Protein: 7.9 g/dL (ref 6.0–8.3)

## 2023-11-21 LAB — CBC WITH DIFFERENTIAL/PLATELET
Basophils Absolute: 0.1 10*3/uL (ref 0.0–0.1)
Basophils Relative: 0.8 % (ref 0.0–3.0)
Eosinophils Absolute: 0.1 10*3/uL (ref 0.0–0.7)
Eosinophils Relative: 0.8 % (ref 0.0–5.0)
HCT: 41.5 % (ref 36.0–46.0)
Hemoglobin: 13.9 g/dL (ref 12.0–15.0)
Lymphocytes Relative: 24 % (ref 12.0–46.0)
Lymphs Abs: 1.8 10*3/uL (ref 0.7–4.0)
MCHC: 33.4 g/dL (ref 30.0–36.0)
MCV: 99.7 fl (ref 78.0–100.0)
Monocytes Absolute: 0.5 10*3/uL (ref 0.1–1.0)
Monocytes Relative: 6.2 % (ref 3.0–12.0)
Neutro Abs: 5.1 10*3/uL (ref 1.4–7.7)
Neutrophils Relative %: 68.2 % (ref 43.0–77.0)
Platelets: 347 10*3/uL (ref 150.0–400.0)
RBC: 4.17 Mil/uL (ref 3.87–5.11)
RDW: 12.8 % (ref 11.5–15.5)
WBC: 7.5 10*3/uL (ref 4.0–10.5)

## 2023-11-21 LAB — T4, FREE: Free T4: 0.75 ng/dL (ref 0.60–1.60)

## 2023-11-21 LAB — URINALYSIS
Bilirubin Urine: NEGATIVE
Hgb urine dipstick: NEGATIVE
Ketones, ur: NEGATIVE
Leukocytes,Ua: NEGATIVE
Nitrite: NEGATIVE
Specific Gravity, Urine: 1.025 (ref 1.000–1.030)
Total Protein, Urine: NEGATIVE
Urine Glucose: NEGATIVE
Urobilinogen, UA: 0.2 (ref 0.0–1.0)
pH: 6 (ref 5.0–8.0)

## 2023-11-21 LAB — HEMOGLOBIN A1C: Hgb A1c MFr Bld: 6.2 % (ref 4.6–6.5)

## 2023-11-21 LAB — VITAMIN B12: Vitamin B-12: >1537 pg/mL — ABNORMAL HIGH (ref 211–911)

## 2023-11-21 LAB — TSH: TSH: 0.33 u[IU]/mL — ABNORMAL LOW (ref 0.35–5.50)

## 2023-11-21 MED ORDER — DOXYCYCLINE HYCLATE 100 MG PO TABS: 100.0000 mg | ORAL_TABLET | Freq: Two times a day (BID) | ORAL | 0 refills | Status: AC

## 2023-11-21 MED ORDER — OXYCODONE HCL 5 MG PO TABS: 5.0000 mg | ORAL_TABLET | Freq: Three times a day (TID) | ORAL | 0 refills | Status: DC | PRN

## 2023-11-21 NOTE — Assessment & Plan Note (Signed)
 On Vit D

## 2023-11-21 NOTE — Assessment & Plan Note (Signed)
 Likely multifactorial. ?etiology Labs are ordered

## 2023-11-21 NOTE — Assessment & Plan Note (Signed)
 Sebaceous cyst Doxy x 2 weeks

## 2023-11-21 NOTE — Assessment & Plan Note (Signed)
 Cont w/Amlodipine, Micardis HCT Monitor BP at home - nl BP per pt NAS diet Re-start meds

## 2023-11-21 NOTE — Progress Notes (Signed)
 Subjective:  Patient ID: Karina Newman, female    DOB: 04-Jun-1956  Age: 68 y.o. MRN: [id number]  CC: Fatigue (Increase in fatigue for the past month. Patient noted they have stopped added sugar, sweets), Arm Pain (Bilateral arm pain. Pain mostly under the right armpit. Patient noted that there was some white discharge from it), and Headache (Worsening headaches, historical for the patient. Patient experiencing nausea when taking SUMAtriptan, does not work as well as the Excedrin but understands she can not have this)   HPI Karina Newman presents for increase in fatigue for the past month. Patient noted they have stopped added sugar, sweets.  Arm Pain (Bilateral arm pain. Pain mostly under the right armpit. Patient noted that there was some white discharge from it). Headache (Worsening headaches, historical for the patient. Patient experiencing nausea when taking SUMAtriptan, does not work as well as the Excedrin but understands she can not have this  Outpatient Medications Prior to Visit  Medication Sig Dispense Refill   amLODipine (NORVASC) 5 MG tablet Take 1 tablet (5 mg total) by mouth daily. 90 tablet 3   carvedilol (COREG) 12.5 MG tablet TAKE 1 TABLET BY MOUTH TWICE A DAY 180 tablet 3   Cholecalciferol 50 MCG (2000 UT) CAPS Take by mouth.     Cyanocobalamin (VITAMIN B-12) 500 MCG SUBL 1 sl qd 100 tablet 5   cyclobenzaprine (FLEXERIL) 5 MG tablet Take 1 tablet (5 mg total) by mouth 3 (three) times daily as needed for muscle spasms. 60 tablet 2   cycloSPORINE (RESTASIS) 0.05 % ophthalmic emulsion Place 1 drop into both eyes 2 (two) times daily.     Diclofenac Sodium 1.5 % SOLN USE 15 DROPS THREE TIMES DAILY EXTERNALLY FOR PAIN 150 mL 5   dicyclomine (BENTYL) 10 MG capsule TAKE 1 CAPSULE (10 MG TOTAL) BY MOUTH EVERY 8 (EIGHT) HOURS AS NEEDED FOR SPASMS. 270 capsule 3   diphenhydrAMINE (BENADRYL) 25 MG tablet Take 25 mg by mouth every 6 (six) hours as needed for allergies.     doxazosin  (CARDURA) 4 MG tablet TAKE 1 TABLET BY MOUTH EVERY DAY 90 tablet 1   DULoxetine (CYMBALTA) 20 MG capsule TAKE 1 CAPSULE BY MOUTH EVERY DAY 90 capsule 1   gabapentin (NEURONTIN) 100 MG capsule TAKE 1 CAPSULE BY MOUTH THREE TIMES A DAY AS NEEDED 90 capsule 3   ipratropium (ATROVENT) 0.06 % nasal spray Place 1 spray into both nostrils 3 (three) times daily as needed for rhinitis. 15 mL 5   levocetirizine (XYZAL) 5 MG tablet Take 1 tablet (5 mg total) by mouth every evening. 30 tablet 5   LORazepam (ATIVAN) 0.5 MG tablet TAKE 1 TABLET (0.5 MG TOTAL) BY MOUTH 2 (TWO) TIMES DAILY AS NEEDED FOR ANXIETY OR SLEEP. 60 tablet 2   omeprazole (PRILOSEC) 40 MG capsule TAKE 1 CAPSULE BY MOUTH EVERY DAY IN THE MORNING AND AT BEDTIME 180 capsule 3   Potassium Chloride ER 20 MEQ TBCR Take 1 tablet (20 mEq total) by mouth daily. 90 tablet 3   pseudoephedrine (SUDAFED) 120 MG 12 hr tablet Take by mouth.     sucralfate (CARAFATE) 1 g tablet TAKE 1 TABLET BY MOUTH 3 TIMES A DAY BETWEEN MEALS 270 tablet 3   telmisartan-hydrochlorothiazide (MICARDIS HCT) 80-25 MG tablet Take 1 tablet by mouth daily. 90 tablet 3   traZODone (DESYREL) 50 MG tablet TAKE 1 TABLET BY MOUTH EVERYDAY AT BEDTIME 90 tablet 3   valACYclovir (VALTREX) 500 MG tablet Take 1 tablet (  500 mg total) by mouth 2 (two) times daily. 60 tablet 1   Vitamin D, Cholecalciferol, 1000 units CAPS Take 2,000 Units by mouth daily.     oxyCODONE (ROXICODONE) 5 MG immediate release tablet Take 1 tablet (5 mg total) by mouth every 8 (eight) hours as needed for severe pain (pain score 7-10). 90 tablet 0   SUMAtriptan (IMITREX) 100 MG tablet TAKE ONE PRN MIGRAINE. MAY REPEAT IN 2 HOURS IF HEADACHE PERSISTS OR RECURS. (Patient not taking: Reported on 11/21/2023) 12 tablet 5   No facility-administered medications prior to visit.    ROS: Review of Systems  Constitutional:  Positive for fatigue. Negative for activity change, appetite change, chills and unexpected weight  change.  HENT:  Negative for congestion, mouth sores and sinus pressure.   Eyes:  Negative for visual disturbance.  Respiratory:  Negative for cough and chest tightness.   Gastrointestinal:  Negative for abdominal pain and nausea.  Genitourinary:  Negative for difficulty urinating, frequency and vaginal pain.  Musculoskeletal:  Negative for back pain and gait problem.  Skin:  Positive for rash. Negative for pallor.  Neurological:  Positive for light-headedness. Negative for dizziness, tremors, weakness, numbness and headaches.  Psychiatric/Behavioral:  Negative for confusion and sleep disturbance.     Objective:  BP (!) 148/80   Pulse 69   Temp 97.8 F (36.6 C)   Ht 5\' 4"  (1.626 m)   Wt 169 lb 3.2 oz (76.7 kg)   SpO2 99%   BMI 29.04 kg/m   BP Readings from Last 3 Encounters:  11/21/23 (!) 148/80  11/16/23 130/82  09/23/23 132/76    Wt Readings from Last 3 Encounters:  11/21/23 169 lb 3.2 oz (76.7 kg)  11/16/23 170 lb (77.1 kg)  09/15/23 176 lb (79.8 kg)    Physical Exam Constitutional:      General: She is not in acute distress.    Appearance: She is well-developed. She is obese.  HENT:     Head: Normocephalic.     Right Ear: External ear normal.     Left Ear: External ear normal.     Nose: Nose normal.  Eyes:     General:        Right eye: No discharge.        Left eye: No discharge.     Conjunctiva/sclera: Conjunctivae normal.     Pupils: Pupils are equal, round, and reactive to light.  Neck:     Thyroid: No thyromegaly.     Vascular: No JVD.     Trachea: No tracheal deviation.  Cardiovascular:     Rate and Rhythm: Normal rate and regular rhythm.     Heart sounds: Normal heart sounds.  Pulmonary:     Effort: No respiratory distress.     Breath sounds: No stridor. No wheezing.  Abdominal:     General: Bowel sounds are normal. There is no distension.     Palpations: Abdomen is soft. There is no mass.     Tenderness: There is no abdominal tenderness.  There is no guarding or rebound.  Musculoskeletal:        General: No tenderness.     Cervical back: Normal range of motion and neck supple. No rigidity.  Lymphadenopathy:     Cervical: No cervical adenopathy.  Skin:    Findings: No erythema or rash.  Neurological:     Cranial Nerves: No cranial nerve deficit.     Motor: No abnormal muscle tone.     Coordination:  Coordination normal.     Deep Tendon Reflexes: Reflexes normal.  Psychiatric:        Behavior: Behavior normal.        Thought Content: Thought content normal.        Judgment: Judgment normal.   R shoulder w/pain Swollen glands in the R axilla - small,  tender, not deep  Lab Results  Component Value Date   WBC 4.4 03/16/2023   HGB 14.4 03/16/2023   HCT 42.7 03/16/2023   PLT 354 03/16/2023   GLUCOSE 147 (H) 10/07/2023   CHOL 161 12/04/2020   TRIG 101.0 12/04/2020   HDL 40.20 12/04/2020   LDLCALC 101 (H) 12/04/2020   ALT 44 (H) 10/07/2023   AST 20 10/07/2023   NA 142 10/07/2023   K 4.4 10/07/2023   CL 101 10/07/2023   CREATININE 0.80 10/07/2023   BUN 14 10/07/2023   CO2 33 (H) 10/07/2023   TSH 0.65 03/01/2022   HGBA1C 6.3 06/15/2023    MM 3D SCREENING MAMMOGRAM BILATERAL BREAST Result Date: 08/19/2023 CLINICAL DATA:  Screening. EXAM: DIGITAL SCREENING BILATERAL MAMMOGRAM WITH TOMOSYNTHESIS AND CAD TECHNIQUE: Bilateral screening digital craniocaudal and mediolateral oblique mammograms were obtained. Bilateral screening digital breast tomosynthesis was performed. The images were evaluated with computer-aided detection. COMPARISON:  Previous exam(s). ACR Breast Density Category b: There are scattered areas of fibroglandular density. FINDINGS: There are no findings suspicious for malignancy. IMPRESSION: No mammographic evidence of malignancy. A result letter of this screening mammogram will be mailed directly to the patient. RECOMMENDATION: Screening mammogram in one year. (Code:SM-B-01Y) BI-RADS CATEGORY  1: Negative.  Electronically Signed   By: Baird Lyons M.D.   On: 08/19/2023 13:27    Assessment & Plan:   Problem List Items Addressed This Visit     HTN (hypertension), benign - Primary   Cont w/Amlodipine, Micardis HCT Monitor BP at home - nl BP per pt NAS diet Re-start meds      Relevant Orders   CBC with Differential/Platelet   Comprehensive metabolic panel with GFR   Hemoglobin A1c   Vitamin B12   TSH   T4, free   Urinalysis   Vitamin D deficiency   On Vit D      Axillary adenitis   Sebaceous cyst Doxy x 2 weeks      Relevant Orders   CBC with Differential/Platelet   Comprehensive metabolic panel with GFR   Hemoglobin A1c   Vitamin B12   TSH   T4, free   Urinalysis   Low vitamin B12 level   On B12      Relevant Orders   Vitamin B12   Fatigue   Likely multifactorial. ?etiology Labs are ordered      Relevant Orders   CBC with Differential/Platelet   Comprehensive metabolic panel with GFR   Hemoglobin A1c   Vitamin B12   TSH   T4, free   Urinalysis      Meds ordered this encounter  Medications   doxycycline (VIBRA-TABS) 100 MG tablet    Sig: Take 1 tablet (100 mg total) by mouth 2 (two) times daily.    Dispense:  20 tablet    Refill:  0   oxyCODONE (ROXICODONE) 5 MG immediate release tablet    Sig: Take 1 tablet (5 mg total) by mouth every 8 (eight) hours as needed for severe pain (pain score 7-10).    Dispense:  90 tablet    Refill:  0      Follow-up: Return in  about 6 weeks (around 01/02/2024).  Sonda Primes, MD

## 2023-11-21 NOTE — Assessment & Plan Note (Signed)
 On B12

## 2023-11-30 ENCOUNTER — Ambulatory Visit: Payer: Medicare Other | Attending: Physician Assistant | Admitting: Physician Assistant

## 2023-11-30 ENCOUNTER — Encounter: Payer: Self-pay | Admitting: Physician Assistant

## 2023-11-30 ENCOUNTER — Telehealth: Payer: Self-pay

## 2023-11-30 VITALS — BP 117/78 | HR 77 | Resp 14 | Ht 64.0 in | Wt 168.0 lb

## 2023-11-30 DIAGNOSIS — M25511 Pain in right shoulder: Secondary | ICD-10-CM | POA: Diagnosis not present

## 2023-11-30 DIAGNOSIS — E559 Vitamin D deficiency, unspecified: Secondary | ICD-10-CM | POA: Diagnosis not present

## 2023-11-30 DIAGNOSIS — K760 Fatty (change of) liver, not elsewhere classified: Secondary | ICD-10-CM

## 2023-11-30 DIAGNOSIS — I119 Hypertensive heart disease without heart failure: Secondary | ICD-10-CM

## 2023-11-30 DIAGNOSIS — M79642 Pain in left hand: Secondary | ICD-10-CM

## 2023-11-30 DIAGNOSIS — M25561 Pain in right knee: Secondary | ICD-10-CM | POA: Diagnosis not present

## 2023-11-30 DIAGNOSIS — D126 Benign neoplasm of colon, unspecified: Secondary | ICD-10-CM

## 2023-11-30 DIAGNOSIS — M170 Bilateral primary osteoarthritis of knee: Secondary | ICD-10-CM | POA: Diagnosis not present

## 2023-11-30 DIAGNOSIS — M7061 Trochanteric bursitis, right hip: Secondary | ICD-10-CM

## 2023-11-30 DIAGNOSIS — E0590 Thyrotoxicosis, unspecified without thyrotoxic crisis or storm: Secondary | ICD-10-CM

## 2023-11-30 DIAGNOSIS — M7062 Trochanteric bursitis, left hip: Secondary | ICD-10-CM

## 2023-11-30 DIAGNOSIS — M5030 Other cervical disc degeneration, unspecified cervical region: Secondary | ICD-10-CM

## 2023-11-30 DIAGNOSIS — M722 Plantar fascial fibromatosis: Secondary | ICD-10-CM

## 2023-11-30 DIAGNOSIS — H43391 Other vitreous opacities, right eye: Secondary | ICD-10-CM | POA: Diagnosis not present

## 2023-11-30 DIAGNOSIS — M79641 Pain in right hand: Secondary | ICD-10-CM

## 2023-11-30 DIAGNOSIS — I10 Essential (primary) hypertension: Secondary | ICD-10-CM | POA: Diagnosis not present

## 2023-11-30 DIAGNOSIS — R768 Other specified abnormal immunological findings in serum: Secondary | ICD-10-CM | POA: Diagnosis not present

## 2023-11-30 DIAGNOSIS — Z8719 Personal history of other diseases of the digestive system: Secondary | ICD-10-CM

## 2023-11-30 DIAGNOSIS — M25562 Pain in left knee: Secondary | ICD-10-CM

## 2023-11-30 DIAGNOSIS — G8929 Other chronic pain: Secondary | ICD-10-CM

## 2023-11-30 DIAGNOSIS — G4709 Other insomnia: Secondary | ICD-10-CM

## 2023-11-30 NOTE — Telephone Encounter (Signed)
 Please call to schedule visco injections.  Approved for Synvisc, Bilateral knee(s). Buy & Bill $20 Copay Deductible does not apply Once the OOP has been met $3900 (met $20), patient is covered at 100% Authorization #[id number] Authorization Dates 04.16.2025 to 04.16.2026

## 2023-11-30 NOTE — Telephone Encounter (Signed)
 VOB submitted for Synvisc, Bilateral knee(s) BV pending

## 2023-11-30 NOTE — Telephone Encounter (Signed)
 Please apply for bilateral knee visco, per Sherron Ales, PA-C. Thanks!

## 2023-12-01 NOTE — Progress Notes (Signed)
CRP within normal limits.

## 2023-12-01 NOTE — Progress Notes (Signed)
 ESR is borderline elevated but has improved.   Complements WNL

## 2023-12-01 NOTE — Progress Notes (Signed)
 dsDNA negative.

## 2023-12-02 LAB — ANTI-NUCLEAR AB-TITER (ANA TITER)
ANA TITER: 1:40 {titer} — ABNORMAL HIGH
ANA TITER: 1:40 {titer} — ABNORMAL HIGH
ANA Titer 1: 1:40 {titer} — ABNORMAL HIGH

## 2023-12-02 LAB — C3 AND C4
C3 Complement: 153 mg/dL (ref 83–193)
C4 Complement: 22 mg/dL (ref 15–57)

## 2023-12-02 LAB — SEDIMENTATION RATE: Sed Rate: 31 mm/h — ABNORMAL HIGH (ref 0–30)

## 2023-12-02 LAB — ANTI-DNA ANTIBODY, DOUBLE-STRANDED: ds DNA Ab: <1 [IU]/mL

## 2023-12-02 LAB — ANA: Anti Nuclear Antibody (ANA): POSITIVE — AB

## 2023-12-02 LAB — C-REACTIVE PROTEIN: CRP: 4.2 mg/L (ref <8.0)

## 2023-12-04 ENCOUNTER — Encounter: Payer: Self-pay | Admitting: Internal Medicine

## 2023-12-04 NOTE — Progress Notes (Signed)
 ANA remains positive.

## 2023-12-05 ENCOUNTER — Other Ambulatory Visit: Payer: Self-pay | Admitting: Internal Medicine

## 2023-12-14 ENCOUNTER — Ambulatory Visit: Payer: Medicare Other | Admitting: Internal Medicine

## 2024-01-03 ENCOUNTER — Encounter: Payer: Self-pay | Admitting: Internal Medicine

## 2024-01-03 ENCOUNTER — Ambulatory Visit (INDEPENDENT_AMBULATORY_CARE_PROVIDER_SITE_OTHER): Admitting: Internal Medicine

## 2024-01-03 VITALS — BP 128/82 | HR 93 | Temp 98.7°F | Ht 64.0 in

## 2024-01-03 DIAGNOSIS — S43431D Superior glenoid labrum lesion of right shoulder, subsequent encounter: Secondary | ICD-10-CM | POA: Diagnosis not present

## 2024-01-03 DIAGNOSIS — E559 Vitamin D deficiency, unspecified: Secondary | ICD-10-CM

## 2024-01-03 DIAGNOSIS — M222X2 Patellofemoral disorders, left knee: Secondary | ICD-10-CM

## 2024-01-03 DIAGNOSIS — M1991 Primary osteoarthritis, unspecified site: Secondary | ICD-10-CM

## 2024-01-03 DIAGNOSIS — S43431S Superior glenoid labrum lesion of right shoulder, sequela: Secondary | ICD-10-CM | POA: Diagnosis not present

## 2024-01-03 DIAGNOSIS — M222X1 Patellofemoral disorders, right knee: Secondary | ICD-10-CM | POA: Diagnosis not present

## 2024-01-03 DIAGNOSIS — M1990 Unspecified osteoarthritis, unspecified site: Secondary | ICD-10-CM | POA: Insufficient documentation

## 2024-01-03 MED ORDER — OMEPRAZOLE 40 MG PO CPDR: 40.0000 mg | DELAYED_RELEASE_CAPSULE | Freq: Two times a day (BID) | ORAL | 3 refills | Status: AC

## 2024-01-03 MED ORDER — OXYCODONE HCL 5 MG PO TABS: 5.0000 mg | ORAL_TABLET | Freq: Three times a day (TID) | ORAL | 0 refills | Status: DC | PRN

## 2024-01-03 MED ORDER — TRAZODONE HCL 50 MG PO TABS: ORAL_TABLET | ORAL | 3 refills | Status: AC

## 2024-01-03 NOTE — Assessment & Plan Note (Signed)
 Off steroids per Ortho now - Dr Abigail Abler On knee inj Seeing Jacinta Martinis, PA (Rheumatology) now

## 2024-01-03 NOTE — Assessment & Plan Note (Signed)
 On Vit D

## 2024-01-03 NOTE — Progress Notes (Signed)
 Subjective:  Patient ID: Karina Newman, female    DOB: 03-10-1956  Age: 68 y.o. MRN: [id number]  CC: Medical Management of Chronic Issues (6 week follow up. Patient notes general fatigue still present but not as bad. Cyst under right arm is no longer present. Still experiencing headaches and is not taking the sumatriptan  anymore, does occasionally take Excedrin but knows she should not be doing so. Would like to review labs (04/07))   HPI Karina Newman presents for CFS - 6 week follow up. Patient notes general fatigue still present but not as bad. Cyst under right arm is no longer present. Still experiencing headaches and is not taking the sumatriptan  anymore, does occasionally take Excedrin but knows she should not be doing so. Would like to review labs (04/07)  Eating better   Outpatient Medications Prior to Visit  Medication Sig Dispense Refill   amLODipine  (NORVASC ) 5 MG tablet Take 1 tablet (5 mg total) by mouth daily. 90 tablet 3   carvedilol  (COREG ) 12.5 MG tablet TAKE 1 TABLET BY MOUTH TWICE A DAY 180 tablet 3   Cholecalciferol 50 MCG (2000 UT) CAPS Take by mouth.     Cyanocobalamin  (VITAMIN B-12) 500 MCG SUBL 1 sl qd 100 tablet 5   cyclobenzaprine  (FLEXERIL ) 5 MG tablet Take 1 tablet (5 mg total) by mouth 3 (three) times daily as needed for muscle spasms. 60 tablet 2   cycloSPORINE (RESTASIS) 0.05 % ophthalmic emulsion Place 1 drop into both eyes 2 (two) times daily.     Diclofenac  Sodium 1.5 % SOLN USE 15 DROPS THREE TIMES DAILY EXTERNALLY FOR PAIN 150 mL 5   dicyclomine  (BENTYL ) 10 MG capsule TAKE 1 CAPSULE (10 MG TOTAL) BY MOUTH EVERY 8 (EIGHT) HOURS AS NEEDED FOR SPASMS. 270 capsule 3   diphenhydrAMINE (BENADRYL) 25 MG tablet Take 25 mg by mouth every 6 (six) hours as needed for allergies.     doxazosin  (CARDURA ) 4 MG tablet TAKE 1 TABLET BY MOUTH EVERY DAY 90 tablet 1   doxycycline  (VIBRA -TABS) 100 MG tablet Take 1 tablet (100 mg total) by mouth 2 (two) times daily. 20 tablet  0   gabapentin  (NEURONTIN ) 100 MG capsule TAKE 1 CAPSULE BY MOUTH THREE TIMES A DAY AS NEEDED 90 capsule 3   ipratropium (ATROVENT ) 0.06 % nasal spray Place 1 spray into both nostrils 3 (three) times daily as needed for rhinitis. 15 mL 5   levocetirizine (XYZAL ) 5 MG tablet Take 1 tablet (5 mg total) by mouth every evening. 30 tablet 5   LORazepam  (ATIVAN ) 0.5 MG tablet TAKE 1 TABLET (0.5 MG TOTAL) BY MOUTH 2 (TWO) TIMES DAILY AS NEEDED FOR ANXIETY OR SLEEP. 60 tablet 2   Potassium Chloride  ER 20 MEQ TBCR Take 1 tablet (20 mEq total) by mouth daily. 90 tablet 3   pseudoephedrine  (SUDAFED) 120 MG 12 hr tablet Take by mouth.     sucralfate  (CARAFATE ) 1 g tablet TAKE 1 TABLET BY MOUTH 3 TIMES A DAY BETWEEN MEALS 270 tablet 3   telmisartan -hydrochlorothiazide (MICARDIS  HCT) 80-25 MG tablet Take 1 tablet by mouth daily. 90 tablet 3   valACYclovir  (VALTREX ) 500 MG tablet Take 1 tablet (500 mg total) by mouth 2 (two) times daily. 60 tablet 1   Vitamin D , Cholecalciferol, 1000 units CAPS Take 2,000 Units by mouth daily.     omeprazole  (PRILOSEC ) 40 MG capsule TAKE 1 CAPSULE BY MOUTH EVERY DAY IN THE MORNING AND AT BEDTIME 180 capsule 3   oxyCODONE  (ROXICODONE ) 5  MG immediate release tablet Take 1 tablet (5 mg total) by mouth every 8 (eight) hours as needed for severe pain (pain score 7-10). 90 tablet 0   DULoxetine  (CYMBALTA ) 20 MG capsule TAKE 1 CAPSULE BY MOUTH EVERY DAY (Patient not taking: Reported on 01/03/2024) 90 capsule 1   SUMAtriptan  (IMITREX ) 100 MG tablet TAKE ONE PRN MIGRAINE. MAY REPEAT IN 2 HOURS IF HEADACHE PERSISTS OR RECURS. (Patient not taking: Reported on 01/03/2024) 12 tablet 5   traZODone  (DESYREL ) 50 MG tablet TAKE 1 TABLET BY MOUTH EVERYDAY AT BEDTIME (Patient not taking: Reported on 11/30/2023) 90 tablet 3   No facility-administered medications prior to visit.    ROS: Review of Systems  Constitutional:  Positive for fatigue. Negative for activity change, appetite change, chills  and unexpected weight change.  HENT:  Negative for congestion, mouth sores and sinus pressure.   Eyes:  Negative for visual disturbance.  Respiratory:  Negative for cough and chest tightness.   Gastrointestinal:  Negative for abdominal pain and nausea.  Genitourinary:  Negative for difficulty urinating, frequency and vaginal pain.  Musculoskeletal:  Positive for arthralgias, back pain, gait problem, neck pain and neck stiffness.  Skin:  Negative for pallor and rash.  Neurological:  Positive for headaches. Negative for dizziness, tremors, weakness and numbness.  Psychiatric/Behavioral:  Positive for dysphoric mood. Negative for confusion, sleep disturbance and suicidal ideas.     Objective:  BP 128/82   Pulse 93   Temp 98.7 F (37.1 C)   Ht 5\' 4"  (1.626 m)   SpO2 99%   BMI 28.84 kg/m   BP Readings from Last 3 Encounters:  01/03/24 128/82  11/30/23 117/78  11/21/23 (!) 148/80    Wt Readings from Last 3 Encounters:  11/30/23 168 lb (76.2 kg)  11/21/23 169 lb 3.2 oz (76.7 kg)  11/16/23 170 lb (77.1 kg)    Physical Exam Constitutional:      General: She is not in acute distress.    Appearance: She is well-developed.  HENT:     Head: Normocephalic.     Right Ear: External ear normal.     Left Ear: External ear normal.     Nose: Nose normal.  Eyes:     General:        Right eye: No discharge.        Left eye: No discharge.     Conjunctiva/sclera: Conjunctivae normal.     Pupils: Pupils are equal, round, and reactive to light.  Neck:     Thyroid : No thyromegaly.     Vascular: No JVD.     Trachea: No tracheal deviation.  Cardiovascular:     Rate and Rhythm: Normal rate and regular rhythm.     Heart sounds: Normal heart sounds.  Pulmonary:     Effort: No respiratory distress.     Breath sounds: No stridor. No wheezing.  Abdominal:     General: Bowel sounds are normal. There is no distension.     Palpations: Abdomen is soft. There is no mass.     Tenderness: There  is no abdominal tenderness. There is no guarding or rebound.  Musculoskeletal:        General: Tenderness present.     Cervical back: Normal range of motion and neck supple. No rigidity.     Right lower leg: No edema.     Left lower leg: No edema.  Lymphadenopathy:     Cervical: No cervical adenopathy.  Skin:    Findings: No erythema  or rash.  Neurological:     Mental Status: She is oriented to person, place, and time. Mental status is at baseline.     Cranial Nerves: No cranial nerve deficit.     Motor: No abnormal muscle tone.     Coordination: Coordination normal.     Deep Tendon Reflexes: Reflexes normal.  Psychiatric:        Behavior: Behavior normal.        Thought Content: Thought content normal.        Judgment: Judgment normal.   Stiff shoulders  Lab Results  Component Value Date   WBC 7.5 11/21/2023   HGB 13.9 11/21/2023   HCT 41.5 11/21/2023   PLT 347.0 11/21/2023   GLUCOSE 115 (H) 11/21/2023   CHOL 161 12/04/2020   TRIG 101.0 12/04/2020   HDL 40.20 12/04/2020   LDLCALC 101 (H) 12/04/2020   ALT 39 (H) 11/21/2023   AST 22 11/21/2023   NA 140 11/21/2023   K 3.8 11/21/2023   CL 102 11/21/2023   CREATININE 0.72 11/21/2023   BUN 9 11/21/2023   CO2 31 11/21/2023   TSH 0.33 (L) 11/21/2023   HGBA1C 6.2 11/21/2023    MM 3D SCREENING MAMMOGRAM BILATERAL BREAST Result Date: 08/19/2023 CLINICAL DATA:  Screening. EXAM: DIGITAL SCREENING BILATERAL MAMMOGRAM WITH TOMOSYNTHESIS AND CAD TECHNIQUE: Bilateral screening digital craniocaudal and mediolateral oblique mammograms were obtained. Bilateral screening digital breast tomosynthesis was performed. The images were evaluated with computer-aided detection. COMPARISON:  Previous exam(s). ACR Breast Density Category b: There are scattered areas of fibroglandular density. FINDINGS: There are no findings suspicious for malignancy. IMPRESSION: No mammographic evidence of malignancy. A result letter of this screening mammogram will  be mailed directly to the patient. RECOMMENDATION: Screening mammogram in one year. (Code:SM-B-01Y) BI-RADS CATEGORY  1: Negative. Electronically Signed   By: Dina  Arceo M.D.   On: 08/19/2023 13:27    Assessment & Plan:   Problem List Items Addressed This Visit     Labral tear of shoulder - Primary   Off steroids per Ortho now - Dr Abigail Abler Seeing Jacinta Martinis, PA (Rheumatology) now Renew Gabapentin  Oxy was renewed  Potential benefits of a long term opioids use as well as potential risks (i.e. addiction risk, apnea etc) and complications (i.e. Somnolence, constipation and others) were explained to the patient and were aknowledged.       Vitamin D  deficiency   On Vit D      Patellofemoral arthralgia of both knees   Off steroids per Ortho now - Dr Abigail Abler On knee inj Seeing Jacinta Martinis, PA (Rheumatology) now      Osteoarthritis    Seeing Jacinta Martinis, PA (Rheumatology) now      Relevant Medications   oxyCODONE  (ROXICODONE ) 5 MG immediate release tablet      Meds ordered this encounter  Medications   traZODone  (DESYREL ) 50 MG tablet    Sig: TAKE 1 TABLET BY MOUTH EVERYDAY AT BEDTIME    Dispense:  90 tablet    Refill:  3   oxyCODONE  (ROXICODONE ) 5 MG immediate release tablet    Sig: Take 1 tablet (5 mg total) by mouth every 8 (eight) hours as needed for severe pain (pain score 7-10).    Dispense:  90 tablet    Refill:  0   omeprazole  (PRILOSEC ) 40 MG capsule    Sig: Take 1 capsule (40 mg total) by mouth 2 (two) times daily.    Dispense:  180 capsule    Refill:  3      Follow-up: Return in about 3 months (around 04/04/2024) for a follow-up visit.  Anitra Barn, MD

## 2024-01-03 NOTE — Assessment & Plan Note (Signed)
  Seeing Jacinta Martinis, PA (Rheumatology) now

## 2024-01-03 NOTE — Assessment & Plan Note (Addendum)
 Off steroids per Ortho now - Dr Abigail Abler Seeing Karina Martinis, PA (Rheumatology) now Renew Gabapentin  Oxy was renewed  Potential benefits of a long term opioids use as well as potential risks (i.e. addiction risk, apnea etc) and complications (i.e. Somnolence, constipation and others) were explained to the patient and were aknowledged.

## 2024-01-04 ENCOUNTER — Encounter: Payer: Self-pay | Admitting: Gastroenterology

## 2024-01-06 ENCOUNTER — Ambulatory Visit: Attending: Physician Assistant | Admitting: Physician Assistant

## 2024-01-06 DIAGNOSIS — M170 Bilateral primary osteoarthritis of knee: Secondary | ICD-10-CM

## 2024-01-06 MED ORDER — LIDOCAINE HCL 1 % IJ SOLN
1.5000 mL | INTRAMUSCULAR | Status: AC | PRN
  Administered 2024-01-06: 1.5 mL

## 2024-01-06 MED ORDER — HYLAN G-F 20 16 MG/2ML IX SOSY
16.0000 mg | PREFILLED_SYRINGE | INTRA_ARTICULAR | Status: AC | PRN
  Administered 2024-01-06: 16 mg via INTRA_ARTICULAR

## 2024-01-06 NOTE — Progress Notes (Signed)
   Procedure Note  Patient: Karina Newman             Date of Birth: Dec 31, 1955           MRN: [id number]             Visit Date: 01/06/2024  Procedures: Visit Diagnoses:  1. Primary osteoarthritis of both knees    #1 Synvisc Bilateral knees B/B Large Joint Inj: bilateral knee on 01/06/2024 10:12 AM Indications: pain Details: 25 G 1.5 in needle, medial approach  Arthrogram: No  Medications (Right): 1.5 mL lidocaine  1 %; 16 mg hylan 16 MG/2ML Aspirate (Right): 0 mL Medications (Left): 1.5 mL lidocaine  1 %; 16 mg hylan 16 MG/2ML Aspirate (Left): 0 mL Outcome: tolerated well, no immediate complications Procedure, treatment alternatives, risks and benefits explained, specific risks discussed. Consent was given by the patient.     Patient tolerated the procedures well.  Aftercare was discussed.  Jacinta Martinis, PA-C

## 2024-01-13 ENCOUNTER — Ambulatory Visit: Attending: Physician Assistant | Admitting: Physician Assistant

## 2024-01-13 DIAGNOSIS — M170 Bilateral primary osteoarthritis of knee: Secondary | ICD-10-CM | POA: Diagnosis not present

## 2024-01-13 MED ORDER — LIDOCAINE HCL 1 % IJ SOLN
1.5000 mL | INTRAMUSCULAR | Status: AC | PRN
  Administered 2024-01-13: 1.5 mL

## 2024-01-13 MED ORDER — HYLAN G-F 20 16 MG/2ML IX SOSY
16.0000 mg | PREFILLED_SYRINGE | INTRA_ARTICULAR | Status: AC | PRN
  Administered 2024-01-13: 16 mg via INTRA_ARTICULAR

## 2024-01-13 NOTE — Progress Notes (Signed)
   Procedure Note  Patient: Karina Newman             Date of Birth: 03-07-1956           MRN: [id number]             Visit Date: 01/13/2024  Procedures: Visit Diagnoses:  1. Primary osteoarthritis of both knees    #2 Synvisc Bilateral knees Large Joint Inj: bilateral knee on 01/13/2024 10:34 AM Indications: pain Details: 25 G 1.5 in needle, medial approach  Arthrogram: No  Medications (Right): 1.5 mL lidocaine  1 %; 16 mg hylan 16 MG/2ML Aspirate (Right): 0 mL Medications (Left): 1.5 mL lidocaine  1 %; 16 mg hylan 16 MG/2ML Aspirate (Left): 0 mL Outcome: tolerated well, no immediate complications Procedure, treatment alternatives, risks and benefits explained, specific risks discussed. Consent was given by the patient.     Patient tolerated the procedures well.  Aftercare was discussed.  Jacinta Martinis, PA-C

## 2024-01-18 ENCOUNTER — Encounter: Payer: Self-pay | Admitting: Gastroenterology

## 2024-01-18 ENCOUNTER — Ambulatory Visit: Admitting: Gastroenterology

## 2024-01-18 VITALS — BP 101/65 | HR 87 | Temp 97.5°F | Resp 15 | Ht 64.0 in | Wt 170.0 lb

## 2024-01-18 DIAGNOSIS — K562 Volvulus: Secondary | ICD-10-CM | POA: Diagnosis not present

## 2024-01-18 DIAGNOSIS — K2940 Chronic atrophic gastritis without bleeding: Secondary | ICD-10-CM | POA: Diagnosis not present

## 2024-01-18 DIAGNOSIS — K3189 Other diseases of stomach and duodenum: Secondary | ICD-10-CM | POA: Diagnosis not present

## 2024-01-18 DIAGNOSIS — K648 Other hemorrhoids: Secondary | ICD-10-CM | POA: Diagnosis not present

## 2024-01-18 DIAGNOSIS — I10 Essential (primary) hypertension: Secondary | ICD-10-CM | POA: Diagnosis not present

## 2024-01-18 DIAGNOSIS — D175 Benign lipomatous neoplasm of intra-abdominal organs: Secondary | ICD-10-CM

## 2024-01-18 DIAGNOSIS — D125 Benign neoplasm of sigmoid colon: Secondary | ICD-10-CM | POA: Diagnosis not present

## 2024-01-18 DIAGNOSIS — K6289 Other specified diseases of anus and rectum: Secondary | ICD-10-CM

## 2024-01-18 DIAGNOSIS — D123 Benign neoplasm of transverse colon: Secondary | ICD-10-CM

## 2024-01-18 DIAGNOSIS — Z860100 Personal history of colon polyps, unspecified: Secondary | ICD-10-CM

## 2024-01-18 DIAGNOSIS — Z1211 Encounter for screening for malignant neoplasm of colon: Secondary | ICD-10-CM | POA: Diagnosis not present

## 2024-01-18 DIAGNOSIS — D120 Benign neoplasm of cecum: Secondary | ICD-10-CM

## 2024-01-18 DIAGNOSIS — K2950 Unspecified chronic gastritis without bleeding: Secondary | ICD-10-CM | POA: Diagnosis not present

## 2024-01-18 DIAGNOSIS — K760 Fatty (change of) liver, not elsewhere classified: Secondary | ICD-10-CM | POA: Diagnosis not present

## 2024-01-18 MED ORDER — SODIUM CHLORIDE 0.9 % IV SOLN
500.0000 mL | INTRAVENOUS | Status: DC
Start: 2024-01-18 — End: 2024-01-18

## 2024-01-18 NOTE — Progress Notes (Signed)
1110 Robinul 0.1 mg IV given due large amount of secretions upon assessment.  MD made aware, vss 

## 2024-01-18 NOTE — Progress Notes (Signed)
 Report given to PACU, vss

## 2024-01-18 NOTE — Progress Notes (Signed)
1137 Robinul 0.1 mg IV given due large amount of secretions upon assessment.  MD made aware, vss 

## 2024-01-18 NOTE — Op Note (Signed)
 [facility] Patient Name: Karina Newman Procedure Date: 01/18/2024 11:17 AM MRN: [id number] Endoscopist: Landon Pinion P. General Kenner , MD, [id number] Age: 68 Referring MD:  Date of Birth: 1956/07/23 Gender: Female Account #: [id number] Procedure:                Upper GI endoscopy Indications:              history of autoimmune / atrophic gastritis, on                            omeprazole  twice daily - intermittent epigastric                            discomfort Medicines:                Monitored Anesthesia Care Procedure:                Pre-Anesthesia Assessment:                           - Prior to the procedure, a History and Physical                            was performed, and patient medications and                            allergies were reviewed. The patient's tolerance of                            previous anesthesia was also reviewed. The risks                            and benefits of the procedure and the sedation                            options and risks were discussed with the patient.                            All questions were answered, and informed consent                            was obtained. Prior Anticoagulants: The patient has                            taken no anticoagulant or antiplatelet agents. ASA                            Grade Assessment: II - A patient with mild systemic                            disease. After reviewing the risks and benefits,                            the patient was deemed in satisfactory condition to  undergo the procedure.                           After obtaining informed consent, the endoscope was                            passed under direct vision. Throughout the                            procedure, the patient's blood pressure, pulse, and                            oxygen saturations were monitored continuously. The                            Olympus scope [id number] was introduced  through the                            mouth, and advanced to the second part of duodenum.                            The upper GI endoscopy was accomplished without                            difficulty. The patient tolerated the procedure                            well. Scope In: Scope Out: Findings:                 Esophagogastric landmarks were identified: the                            Z-line was found at 39 cm, the gastroesophageal                            junction was found at 39 cm and the upper extent of                            the gastric folds was found at 39 cm from the                            incisors.                           The exam of the esophagus was otherwise normal.                           There was a lipoma in the gastric antrum. Stable in                            appearance since the last exam                           Diffuse atrophic mucosa was found in  the entire                            examined stomach. Biopsies were taken with a cold                            forceps for histology (gastric mapping biopsies) -                            rule out GIM, dysplasia.                           The exam of the stomach was otherwise normal.                           The duodenal bulb and second portion of the                            duodenum were normal. Complications:            No immediate complications. Estimated blood loss:                            Minimal. Estimated Blood Loss:     Estimated blood loss was minimal. Impression:               - Esophagogastric landmarks identified.                           - Normal esophagus otherwise.                           - Gastric lipoma.                           - Gastric mucosal atrophy. Biopsied.                           - Normal stomach otherwise.                           - Normal duodenal bulb and second portion of the                            duodenum. Recommendation:           - Patient  has a contact number available for                            emergencies. The signs and symptoms of potential                            delayed complications were discussed with the                            patient. Return to normal activities tomorrow.  Written discharge instructions were provided to the                            patient.                           - Resume previous diet.                           - Continue present medications.                           - Await pathology results.                           - If not yet tried, add FD gard to treat component                            of dyspepsia. Will see if the office has some free                            samples to use PRN. Landon Pinion P. [name], MD 01/18/2024 12:10:49 PM This report has been signed electronically.

## 2024-01-18 NOTE — Progress Notes (Signed)
1148 Ephedrine 10 mg given IV due to low BP, MD updated.

## 2024-01-18 NOTE — Progress Notes (Signed)
 Called to room to assist during endoscopic procedure.  Patient ID and intended procedure confirmed with present staff. Received instructions for my participation in the procedure from the performing physician.

## 2024-01-18 NOTE — Op Note (Signed)
 [facility] Patient Name: Karina Newman Procedure Date: 01/18/2024 11:16 AM MRN: [id number] Endoscopist: Landon Pinion P. General Kenner , MD, [id number] Age: 68 Referring MD:  Date of Birth: Sep 28, 1955 Gender: Female Account #: [id number] Procedure:                Colonoscopy Indications:              High risk colon cancer surveillance: Personal                            history of colonic polyps - adenoma removed 04/2016 Medicines:                Monitored Anesthesia Care Procedure:                Pre-Anesthesia Assessment:                           - Prior to the procedure, a History and Physical                            was performed, and patient medications and                            allergies were reviewed. The patient's tolerance of                            previous anesthesia was also reviewed. The risks                            and benefits of the procedure and the sedation                            options and risks were discussed with the patient.                            All questions were answered, and informed consent                            was obtained. Prior Anticoagulants: The patient has                            taken no anticoagulant or antiplatelet agents. ASA                            Grade Assessment: II - A patient with mild systemic                            disease. After reviewing the risks and benefits,                            the patient was deemed in satisfactory condition to                            undergo the procedure.  After obtaining informed consent, the colonoscope                            was passed under direct vision. Throughout the                            procedure, the patient's blood pressure, pulse, and                            oxygen saturations were monitored continuously. The                            Olympus Scope SN: [id number] was introduced through                            the  anus and advanced to the the cecum, identified                            by appendiceal orifice and ileocecal valve. The                            colonoscopy was performed without difficulty. The                            patient tolerated the procedure well. The quality                            of the bowel preparation was good. The ileocecal                            valve, appendiceal orifice, and rectum were                            photographed. Scope In: 11:37:20 AM Scope Out: 12:01:13 PM Scope Withdrawal Time: 0 hours 19 minutes 12 seconds  Total Procedure Duration: 0 hours 23 minutes 53 seconds  Findings:                 The perianal and digital rectal examinations were                            normal.                           Three sessile polyps were found in the cecum. The                            polyps were 2 to 3 mm in size. These polyps were                            removed with a cold snare. Resection and retrieval                            were complete.  Two sessile polyps were found in the hepatic                            flexure. The polyps were 2 to 3 mm in size. These                            polyps were removed with a cold snare. Resection                            and retrieval were complete.                           A 3 mm polyp was found in the transverse colon. The                            polyp was sessile. The polyp was removed with a                            cold snare. Resection and retrieval were complete.                           A diminutive polyp was found in the sigmoid colon.                            The polyp was sessile. The polyp was removed with a                            cold snare. Resection and retrieval were complete.                           The ascending colon revealed excessive looping,                            abdominal pressure applied to achieve cecal                             intubtation.                           Anal papilla(e) was hypertrophied. Biopsies were                            taken with a cold forceps for histology, rule out                            AIN.                           Internal hemorrhoids were found during retroflexion.                           The exam was otherwise without abnormality on  direct and retroflexion views. Complications:            No immediate complications. Estimated blood loss:                            Minimal. Estimated Blood Loss:     Estimated blood loss was minimal. Impression:               - Three 2 to 3 mm polyps in the cecum, removed with                            a cold snare. Resected and retrieved.                           - Two 2 to 3 mm polyps at the hepatic flexure,                            removed with a cold snare. Resected and retrieved.                           - One 3 mm polyp in the transverse colon, removed                            with a cold snare. Resected and retrieved.                           - One diminutive polyp in the sigmoid colon,                            removed with a cold snare. Resected and retrieved.                           - There was significant looping of the colon.                           - Anal papilla(e) was hypertrophied. Biopsied.                           - Internal hemorrhoids.                           - The examination was otherwise normal on direct                            and retroflexion views. Recommendation:           - Patient has a contact number available for                            emergencies. The signs and symptoms of potential                            delayed complications were discussed with the  patient. Return to normal activities tomorrow.                            Written discharge instructions were provided to the                            patient.                            - Resume previous diet.                           - Continue present medications.                           - Await pathology results. Landon Pinion P. Shadai Mcclane, MD 01/18/2024 12:09:04 PM This report has been signed electronically.

## 2024-01-18 NOTE — Patient Instructions (Signed)
 Handouts Provided:  Polyps and Hiatal hernia  FD Gard samples provided.  YOU HAD AN ENDOSCOPIC PROCEDURE TODAY AT THE [facility]:   Refer to the procedure report that was given to you for any specific questions about what was found during the examination.  If the procedure report does not answer your questions, please call your gastroenterologist to clarify.  If you requested that your care partner not be given the details of your procedure findings, then the procedure report has been included in a sealed envelope for you to review at your convenience later.  YOU SHOULD EXPECT: Some feelings of bloating in the abdomen. Passage of more gas than usual.  Walking can help get rid of the air that was put into your GI tract during the procedure and reduce the bloating. If you had a lower endoscopy (such as a colonoscopy or flexible sigmoidoscopy) you may notice spotting of blood in your stool or on the toilet paper. If you underwent a bowel prep for your procedure, you may not have a normal bowel movement for a few days.  Please Note:  You might notice some irritation and congestion in your nose or some drainage.  This is from the oxygen used during your procedure.  There is no need for concern and it should clear up in a day or so.  SYMPTOMS TO REPORT IMMEDIATELY:  Following lower endoscopy (colonoscopy or flexible sigmoidoscopy):  Excessive amounts of blood in the stool  Significant tenderness or worsening of abdominal pains  Swelling of the abdomen that is new, acute  Fever of 100F or higher  Following upper endoscopy (EGD)  Vomiting of blood or coffee ground material  New chest pain or pain under the shoulder blades  Painful or persistently difficult swallowing  New shortness of breath  Fever of 100F or higher  Black, tarry-looking stools  For urgent or emergent issues, a gastroenterologist can be reached at any hour by calling [phone]. Do not use MyChart messaging for  urgent concerns.    DIET:  We do recommend a small meal at first, but then you may proceed to your regular diet.  Drink plenty of fluids but you should avoid alcoholic beverages for 24 hours.  ACTIVITY:  You should plan to take it easy for the rest of today and you should NOT DRIVE or use heavy machinery until tomorrow (because of the sedation medicines used during the test).    FOLLOW UP: Our staff will call the number listed on your records the next business day following your procedure.  We will call around 7:15- 8:00 am to check on you and address any questions or concerns that you may have regarding the information given to you following your procedure. If we do not reach you, we will leave a message.     If any biopsies were taken you will be contacted by phone or by letter within the next 1-3 weeks.  Please call us  at [phone] if you have not heard about the biopsies in 3 weeks.    SIGNATURES/CONFIDENTIALITY: You and/or your care partner have signed paperwork which will be entered into your electronic medical record.  These signatures attest to the fact that that the information above on your After Visit Summary has been reviewed and is understood.  Full responsibility of the confidentiality of this discharge information lies with you and/or your care-partner.

## 2024-01-18 NOTE — Progress Notes (Signed)
1125 HR > 100 with esmolol 25 mg given IV, MD updated, vss 

## 2024-01-18 NOTE — Progress Notes (Signed)
 [facility] History and Physical   Primary Care Physician:  Plotnikov, Oakley Bellman, MD   Reason for Procedure:   History of colon polyps, autoimmune gastritis  Plan:    EGD with biopsies, colonoscopy     HPI: Karina Newman is a 68 y.o. female  here for EGD and colonoscopy to evaluate issues as above. Had an adenoma removed 04/2016, history of autoimmune gastritis, last had full EGD with mapping in 2022. Epigastric pain on BID PPI.    Otherwise feels well without any cardiopulmonary symptoms.   I have discussed risks / benefits of anesthesia and endoscopic procedure with Celinda Collar and they wish to proceed with the exams as outlined today.    Past Medical History:  Diagnosis Date   Abnormal cervical Pap smear with positive HPV DNA test 01/2014,01/2017   2015 Normal cytology with positive high-risk HPV 18/45. Colposcopy negative with negative ECC.  2018 normal cytology with positive high-risk HPV 18/45   Anemia    Apnea 05/17/2018   Autoimmune gastritis    Cataract    Fatty liver    Gastritis    Heart murmur    Hypertension    Hyperthyroidism    Inappropriate sinus tachycardia (HCC) 05/17/2018   Internal hemorrhoids    Osteoarthritis    Right shoulder pain 2001   chronic pain   Thyroid  disease    Hyperthyroid. Was on PTU (Dr Tanda Falter in [place]) - stopped in 2013, stable   Tubular adenoma of colon     Past Surgical History:  Procedure Laterality Date   BIOPSY  03/11/2022   Procedure: BIOPSY;  Surgeon: Brice Campi Albino Alu., MD;  Location: [facility] ENDOSCOPY;  Service: Gastroenterology;;   BREAST CYST ASPIRATION Left 2012   CATARACT EXTRACTION Right 06/15/2022   CATARACT EXTRACTION Left 06/28/2022   CESAREAN SECTION     x 4    COLONOSCOPY     ESOPHAGOGASTRODUODENOSCOPY (EGD) WITH PROPOFOL  N/A 03/11/2022   Procedure: ESOPHAGOGASTRODUODENOSCOPY (EGD) WITH PROPOFOL ;  Surgeon: Normie Becton., MD;  Location: [facility] ENDOSCOPY;  Service: Gastroenterology;   Laterality: N/A;   EUS N/A 03/11/2022   Procedure: UPPER ENDOSCOPIC ULTRASOUND (EUS) RADIAL;  Surgeon: Normie Becton., MD;  Location: [facility] ENDOSCOPY;  Service: Gastroenterology;  Laterality: N/A;   HAND SURGERY Left    SHOULDER SURGERY Right 2002   SHOULDER SURGERY Left 2014   TUBAL LIGATION     UPPER GASTROINTESTINAL ENDOSCOPY     Vitrectomy surgery Right 03/21/2023   Also cyst removal/ Lt eye done 04/21/2023    Prior to Admission medications   Medication Sig Start Date End Date Taking? Authorizing Provider  amLODipine  (NORVASC ) 5 MG tablet Take 1 tablet (5 mg total) by mouth daily. 09/15/23  Yes Plotnikov, Oakley Bellman, MD  carvedilol  (COREG ) 12.5 MG tablet TAKE 1 TABLET BY MOUTH TWICE A DAY 09/23/23  Yes Plotnikov, Aleksei V, MD  Cholecalciferol 50 MCG (2000 UT) CAPS Take by mouth. 10/26/15  Yes [provider]  Cyanocobalamin  (VITAMIN B-12) 500 MCG SUBL 1 sl qd 05/02/18  Yes Plotnikov, Aleksei V, MD  cycloSPORINE (RESTASIS) 0.05 % ophthalmic emulsion Place 1 drop into both eyes 2 (two) times daily.   Yes [provider]  dicyclomine  (BENTYL ) 10 MG capsule TAKE 1 CAPSULE (10 MG TOTAL) BY MOUTH EVERY 8 (EIGHT) HOURS AS NEEDED FOR SPASMS. 04/14/23  Yes Plotnikov, Aleksei V, MD  diphenhydrAMINE (BENADRYL) 25 MG tablet Take 25 mg by mouth every 6 (six) hours as needed for allergies.   Yes [provider]  doxazosin  (CARDURA ) 4 MG tablet TAKE 1 TABLET BY MOUTH EVERY DAY 12/06/23  Yes Plotnikov, Oakley Bellman, MD  gabapentin  (NEURONTIN ) 100 MG capsule TAKE 1 CAPSULE BY MOUTH THREE TIMES A DAY AS NEEDED 06/15/23  Yes Plotnikov, Aleksei V, MD  ipratropium (ATROVENT ) 0.06 % nasal spray Place 1 spray into both nostrils 3 (three) times daily as needed for rhinitis. 09/23/23  Yes Kandice Orleans, MD  levocetirizine (XYZAL ) 5 MG tablet Take 1 tablet (5 mg total) by mouth every evening. 09/23/23  Yes Patel, Ammie Bale, MD  LORazepam  (ATIVAN ) 0.5 MG tablet TAKE 1 TABLET (0.5 MG TOTAL) BY  MOUTH 2 (TWO) TIMES DAILY AS NEEDED FOR ANXIETY OR SLEEP. 11/15/23  Yes Plotnikov, Oakley Bellman, MD  omeprazole  (PRILOSEC ) 40 MG capsule Take 1 capsule (40 mg total) by mouth 2 (two) times daily. 01/03/24  Yes Plotnikov, Oakley Bellman, MD  oxyCODONE  (ROXICODONE ) 5 MG immediate release tablet Take 1 tablet (5 mg total) by mouth every 8 (eight) hours as needed for severe pain (pain score 7-10). 01/03/24  Yes Plotnikov, Oakley Bellman, MD  Potassium Chloride  ER 20 MEQ TBCR Take 1 tablet (20 mEq total) by mouth daily. 09/07/22  Yes Plotnikov, Oakley Bellman, MD  sucralfate  (CARAFATE ) 1 g tablet TAKE 1 TABLET BY MOUTH 3 TIMES A DAY BETWEEN MEALS 09/16/22  Yes Plotnikov, Aleksei V, MD  telmisartan -hydrochlorothiazide (MICARDIS  HCT) 80-25 MG tablet Take 1 tablet by mouth daily. 09/15/23  Yes Plotnikov, Oakley Bellman, MD  traZODone  (DESYREL ) 50 MG tablet TAKE 1 TABLET BY MOUTH EVERYDAY AT BEDTIME 01/03/24  Yes Plotnikov, Aleksei V, MD  valACYclovir  (VALTREX ) 500 MG tablet Take 1 tablet (500 mg total) by mouth 2 (two) times daily. 10/05/23  Yes Plotnikov, Aleksei V, MD  Vitamin D , Cholecalciferol, 1000 units CAPS Take 2,000 Units by mouth daily.   Yes [provider]  cyclobenzaprine  (FLEXERIL ) 5 MG tablet Take 1 tablet (5 mg total) by mouth 3 (three) times daily as needed for muscle spasms. 03/10/23   Plotnikov, Aleksei V, MD  Diclofenac  Sodium 1.5 % SOLN USE 15 DROPS THREE TIMES DAILY EXTERNALLY FOR PAIN 09/15/23   Plotnikov, Aleksei V, MD  doxycycline  (VIBRA -TABS) 100 MG tablet Take 1 tablet (100 mg total) by mouth 2 (two) times daily. 11/21/23   Plotnikov, Oakley Bellman, MD  DULoxetine  (CYMBALTA ) 20 MG capsule TAKE 1 CAPSULE BY MOUTH EVERY DAY Patient not taking: Reported on 11/30/2023 06/06/23   Plotnikov, Oakley Bellman, MD  pseudoephedrine  (SUDAFED) 120 MG 12 hr tablet Take by mouth. 08/20/13   [provider]    Current Outpatient Medications  Medication Sig Dispense Refill   amLODipine  (NORVASC ) 5 MG tablet Take 1 tablet (5  mg total) by mouth daily. 90 tablet 3   carvedilol  (COREG ) 12.5 MG tablet TAKE 1 TABLET BY MOUTH TWICE A DAY 180 tablet 3   Cholecalciferol 50 MCG (2000 UT) CAPS Take by mouth.     Cyanocobalamin  (VITAMIN B-12) 500 MCG SUBL 1 sl qd 100 tablet 5   cycloSPORINE (RESTASIS) 0.05 % ophthalmic emulsion Place 1 drop into both eyes 2 (two) times daily.     dicyclomine  (BENTYL ) 10 MG capsule TAKE 1 CAPSULE (10 MG TOTAL) BY MOUTH EVERY 8 (EIGHT) HOURS AS NEEDED FOR SPASMS. 270 capsule 3   diphenhydrAMINE (BENADRYL) 25 MG tablet Take 25 mg by mouth every 6 (six) hours as needed for allergies.     doxazosin  (CARDURA ) 4 MG tablet TAKE 1 TABLET BY MOUTH EVERY DAY 90 tablet  1   gabapentin  (NEURONTIN ) 100 MG capsule TAKE 1 CAPSULE BY MOUTH THREE TIMES A DAY AS NEEDED 90 capsule 3   ipratropium (ATROVENT ) 0.06 % nasal spray Place 1 spray into both nostrils 3 (three) times daily as needed for rhinitis. 15 mL 5   levocetirizine (XYZAL ) 5 MG tablet Take 1 tablet (5 mg total) by mouth every evening. 30 tablet 5   LORazepam  (ATIVAN ) 0.5 MG tablet TAKE 1 TABLET (0.5 MG TOTAL) BY MOUTH 2 (TWO) TIMES DAILY AS NEEDED FOR ANXIETY OR SLEEP. 60 tablet 2   omeprazole  (PRILOSEC ) 40 MG capsule Take 1 capsule (40 mg total) by mouth 2 (two) times daily. 180 capsule 3   oxyCODONE  (ROXICODONE ) 5 MG immediate release tablet Take 1 tablet (5 mg total) by mouth every 8 (eight) hours as needed for severe pain (pain score 7-10). 90 tablet 0   Potassium Chloride  ER 20 MEQ TBCR Take 1 tablet (20 mEq total) by mouth daily. 90 tablet 3   sucralfate  (CARAFATE ) 1 g tablet TAKE 1 TABLET BY MOUTH 3 TIMES A DAY BETWEEN MEALS 270 tablet 3   telmisartan -hydrochlorothiazide (MICARDIS  HCT) 80-25 MG tablet Take 1 tablet by mouth daily. 90 tablet 3   traZODone  (DESYREL ) 50 MG tablet TAKE 1 TABLET BY MOUTH EVERYDAY AT BEDTIME 90 tablet 3   valACYclovir  (VALTREX ) 500 MG tablet Take 1 tablet (500 mg total) by mouth 2 (two) times daily. 60 tablet 1    Vitamin D , Cholecalciferol, 1000 units CAPS Take 2,000 Units by mouth daily.     cyclobenzaprine  (FLEXERIL ) 5 MG tablet Take 1 tablet (5 mg total) by mouth 3 (three) times daily as needed for muscle spasms. 60 tablet 2   Diclofenac  Sodium 1.5 % SOLN USE 15 DROPS THREE TIMES DAILY EXTERNALLY FOR PAIN 150 mL 5   doxycycline  (VIBRA -TABS) 100 MG tablet Take 1 tablet (100 mg total) by mouth 2 (two) times daily. 20 tablet 0   DULoxetine  (CYMBALTA ) 20 MG capsule TAKE 1 CAPSULE BY MOUTH EVERY DAY (Patient not taking: Reported on 11/30/2023) 90 capsule 1   pseudoephedrine  (SUDAFED) 120 MG 12 hr tablet Take by mouth.     Current Facility-Administered Medications  Medication Dose Route Frequency Provider Last Rate Last Admin   0.9 %  sodium chloride  infusion  500 mL Intravenous Continuous Aala Ransom, Lendon Queen, MD        Allergies as of 01/18/2024 - Review Complete 01/18/2024  Allergen Reaction Noted   Penicillins Hives 07/03/2013   Tramadol  Anaphylaxis 04/18/2018   Iodine Other (See Comments) 09/03/2016   Lyrica  [pregabalin ] Dermatitis 01/20/2015   Voltaren  [diclofenac ] Hives 07/16/2015   Lexapro  [escitalopram ] Other (See Comments) 12/04/2020   Nsaids Other (See Comments) 05/18/2016    Family History  Problem Relation Age of Onset   Kidney disease Mother        ESRD   Hypertension Mother    Lung cancer Father    Hypertension Sister    Kidney disease Sister    Hypertension Sister    Diabetes Brother    Hypertension Brother    Hypertension Brother    Diabetes Paternal Grandmother    Sleep apnea Son    Hypertension Son    Colon cancer Neg Hx    Stomach cancer Neg Hx    Esophageal cancer Neg Hx    Rectal cancer Neg Hx    Pancreatic cancer Neg Hx     Social History   Socioeconomic History   Marital status: Married    Spouse  name: Sherran Dock   Number of children: 4   Years of education: Not on file   Highest education level: Not on file  Occupational History   Occupation: RETIRED   Tobacco Use   Smoking status: Never    Passive exposure: Never   Smokeless tobacco: Never  Vaping Use   Vaping status: Never Used  Substance and Sexual Activity   Alcohol use: Yes    Comment: RARELY   Drug use: No   Sexual activity: Yes    Birth control/protection: Post-menopausal, Surgical    Comment: BTL-1st intercourse 16 yo-5 partners  Other Topics Concern   Not on file  Social History Narrative   Live with husbands   Social Drivers of Health   Financial Resource Strain: Low Risk  (06/14/2023)   Overall Financial Resource Strain (CARDIA)    Difficulty of Paying Living Expenses: Not very hard  Food Insecurity: No Food Insecurity (06/14/2023)   Hunger Vital Sign    Worried About Running Out of Food in the Last Year: Never true    Ran Out of Food in the Last Year: Never true  Transportation Needs: No Transportation Needs (06/14/2023)   PRAPARE - Administrator, Civil Service (Medical): No    Lack of Transportation (Non-Medical): No  Physical Activity: Insufficiently Active (06/14/2023)   Exercise Vital Sign    Days of Exercise per Week: 3 days    Minutes of Exercise per Session: 30 min  Stress: No Stress Concern Present (06/14/2023)   [employer] of Occupational Health - Occupational Stress Questionnaire    Feeling of Stress : Only a little  Social Connections: Moderately Isolated (06/14/2023)   Social Connection and Isolation Panel [NHANES]    Frequency of Communication with Friends and Family: More than three times a week    Frequency of Social Gatherings with Friends and Family: Once a week    Attends Religious Services: Never    Database administrator or Organizations: No    Attends Banker Meetings: Never    Marital Status: Married  Catering manager Violence: Not At Risk (06/14/2023)   Humiliation, Afraid, Rape, and Kick questionnaire    Fear of Current or Ex-Partner: No    Emotionally Abused: No    Physically Abused: No     Sexually Abused: No    Review of Systems: All other review of systems negative except as mentioned in the HPI.  Physical Exam: Vital signs BP (!) 145/88   Pulse 67   Temp (!) 97.5 F (36.4 C) (Temporal)   Ht 5\' 4"  (1.626 m)   Wt 170 lb (77.1 kg)   SpO2 97%   BMI 29.18 kg/m   General:   Alert,  Well-developed, pleasant and cooperative in NAD Lungs:  Clear throughout to auscultation.   Heart:  Regular rate and rhythm Abdomen:  Soft, nontender and nondistended.   Neuro/Psych:  Alert and cooperative. Normal mood and affect. A and O x 3  Christi Coward, MD [facility]

## 2024-01-19 ENCOUNTER — Telehealth: Payer: Self-pay

## 2024-01-19 NOTE — Telephone Encounter (Signed)
  Follow up Call-     01/18/2024   10:31 AM 04/28/2021    8:10 AM  Call back number  Post procedure Call Back phone  # [phone]  Permission to leave phone message Yes Yes     Patient questions:  Do you have a fever, pain , or abdominal swelling? No. Pain Score  0 *  Have you tolerated food without any problems? Yes.    Have you been able to return to your normal activities? Yes.    Do you have any questions about your discharge instructions: Diet   No. Medications  No. Follow up visit  No.  Do you have questions or concerns about your Care? No.  Actions: * If pain score is 4 or above: No action needed, pain <4.

## 2024-01-20 ENCOUNTER — Ambulatory Visit: Attending: Physician Assistant | Admitting: Physician Assistant

## 2024-01-20 DIAGNOSIS — M170 Bilateral primary osteoarthritis of knee: Secondary | ICD-10-CM

## 2024-01-20 MED ORDER — LIDOCAINE HCL 1 % IJ SOLN
1.5000 mL | INTRAMUSCULAR | Status: AC | PRN
  Administered 2024-01-20: 1.5 mL

## 2024-01-20 MED ORDER — LIDOCAINE HCL 1 % IJ SOLN
1.5000 mL | INTRAMUSCULAR | Status: AC | PRN
Start: 2024-01-20 — End: 2024-01-20
  Administered 2024-01-20: 1.5 mL

## 2024-01-20 MED ORDER — HYLAN G-F 20 16 MG/2ML IX SOSY
16.0000 mg | PREFILLED_SYRINGE | INTRA_ARTICULAR | Status: AC | PRN
  Administered 2024-01-20: 16 mg via INTRA_ARTICULAR

## 2024-01-20 NOTE — Progress Notes (Signed)
   Procedure Note  Patient: Karina Newman             Date of Birth: April 22, 1956           MRN: [id number]             Visit Date: 01/20/2024  Procedures:  Visit Diagnoses:  1. Primary osteoarthritis of both knees     #3 SYNVISC B/B BILATERAL KNEES  Large Joint Inj: bilateral knee on 01/20/2024 10:24 AM Indications: pain Details: 25 G 1.5 in needle, medial approach  Arthrogram: No  Medications (Right): 1.5 mL lidocaine  1 %; 16 mg hylan 16 MG/2ML Aspirate (Right): 0 mL Medications (Left): 1.5 mL lidocaine  1 %; 16 mg hylan 16 MG/2ML Aspirate (Left): 0 mL Outcome: tolerated well, no immediate complications Procedure, treatment alternatives, risks and benefits explained, specific risks discussed. Consent was given by the patient.     Patient tolerated the procedures well.  Aftercare was discussed.  Jacinta Martinis, PA-C

## 2024-01-23 ENCOUNTER — Ambulatory Visit: Payer: Self-pay | Admitting: Gastroenterology

## 2024-01-23 LAB — SURGICAL PATHOLOGY

## 2024-01-24 ENCOUNTER — Other Ambulatory Visit: Payer: Self-pay | Admitting: Internal Medicine

## 2024-01-25 ENCOUNTER — Other Ambulatory Visit: Payer: Medicare Other

## 2024-02-06 ENCOUNTER — Ambulatory Visit (HOSPITAL_BASED_OUTPATIENT_CLINIC_OR_DEPARTMENT_OTHER)
Admission: RE | Admit: 2024-02-06 | Discharge: 2024-02-06 | Disposition: A | Discharge status: Home / Self Care | Source: Ambulatory Visit | Attending: Internal Medicine | Admitting: Internal Medicine

## 2024-02-06 DIAGNOSIS — Z0000 Encounter for general adult medical examination without abnormal findings: Secondary | ICD-10-CM | POA: Insufficient documentation

## 2024-02-06 DIAGNOSIS — Z780 Asymptomatic menopausal state: Secondary | ICD-10-CM | POA: Diagnosis not present

## 2024-02-06 DIAGNOSIS — M8589 Other specified disorders of bone density and structure, multiple sites: Secondary | ICD-10-CM | POA: Diagnosis not present

## 2024-03-22 DIAGNOSIS — H35353 Cystoid macular degeneration, bilateral: Secondary | ICD-10-CM | POA: Diagnosis not present

## 2024-04-04 ENCOUNTER — Ambulatory Visit: Admitting: Internal Medicine

## 2024-04-06 ENCOUNTER — Ambulatory Visit: Payer: Medicare Other | Admitting: Internal Medicine

## 2024-04-10 ENCOUNTER — Encounter: Payer: Self-pay | Admitting: Internal Medicine

## 2024-04-10 ENCOUNTER — Ambulatory Visit (INDEPENDENT_AMBULATORY_CARE_PROVIDER_SITE_OTHER): Admitting: Internal Medicine

## 2024-04-10 VITALS — BP 130/87 | HR 72 | Temp 97.8°F | Ht 64.0 in | Wt 167.0 lb

## 2024-04-10 DIAGNOSIS — K760 Fatty (change of) liver, not elsewhere classified: Secondary | ICD-10-CM | POA: Diagnosis not present

## 2024-04-10 DIAGNOSIS — M25511 Pain in right shoulder: Secondary | ICD-10-CM

## 2024-04-10 DIAGNOSIS — R7989 Other specified abnormal findings of blood chemistry: Secondary | ICD-10-CM | POA: Diagnosis not present

## 2024-04-10 DIAGNOSIS — R1013 Epigastric pain: Secondary | ICD-10-CM | POA: Diagnosis not present

## 2024-04-10 DIAGNOSIS — G8929 Other chronic pain: Secondary | ICD-10-CM

## 2024-04-10 DIAGNOSIS — K2940 Chronic atrophic gastritis without bleeding: Secondary | ICD-10-CM

## 2024-04-10 DIAGNOSIS — M25512 Pain in left shoulder: Secondary | ICD-10-CM

## 2024-04-10 DIAGNOSIS — E876 Hypokalemia: Secondary | ICD-10-CM

## 2024-04-10 DIAGNOSIS — E559 Vitamin D deficiency, unspecified: Secondary | ICD-10-CM | POA: Diagnosis not present

## 2024-04-10 DIAGNOSIS — R6881 Early satiety: Secondary | ICD-10-CM | POA: Diagnosis not present

## 2024-04-10 MED ORDER — OXYCODONE HCL 5 MG PO TABS: 5.0000 mg | ORAL_TABLET | Freq: Three times a day (TID) | ORAL | 0 refills | Status: DC | PRN

## 2024-04-10 MED ORDER — CYCLOBENZAPRINE HCL 5 MG PO TABS: 5.0000 mg | ORAL_TABLET | Freq: Three times a day (TID) | ORAL | 2 refills | Status: AC | PRN

## 2024-04-10 MED ORDER — SUCRALFATE 1 G PO TABS: ORAL_TABLET | ORAL | 3 refills | Status: DC

## 2024-04-10 MED ORDER — POTASSIUM CHLORIDE ER 20 MEQ PO TBCR: 20.0000 meq | EXTENDED_RELEASE_TABLET | Freq: Every day | ORAL | 3 refills | Status: AC

## 2024-04-10 NOTE — Assessment & Plan Note (Signed)
 Pt saw Dr Leigh: she had EGD/colon done

## 2024-04-10 NOTE — Assessment & Plan Note (Signed)
 Last US , [place] 2022 - mild fatty liver No alcohol, Tylenol , good diet

## 2024-04-10 NOTE — Assessment & Plan Note (Signed)
 On Vit D

## 2024-04-10 NOTE — Assessment & Plan Note (Signed)
 On B12

## 2024-04-10 NOTE — Assessment & Plan Note (Signed)
 Pt saw Dr Leigh: she had EGD/colon done Repeat EGD in 3 years

## 2024-04-10 NOTE — Progress Notes (Signed)
 Subjective:  Patient ID: Karina Newman, female    DOB: 03-Apr-1956  Age: 68 y.o. MRN: [id number]  CC: Medical Management of Chronic Issues (3 mnth f/u and discuss seing a specialist for her Fatty Liver disease )   HPI Karina Newman presents for abdominal pain f/u. Pt saw Dr Leigh: Karina Newman had EGD/colon done F/u on chronic pain, B12 def  Outpatient Medications Prior to Visit  Medication Sig Dispense Refill   amLODipine  (NORVASC ) 5 MG tablet Take 1 tablet (5 mg total) by mouth daily. 90 tablet 3   Bromfenac Sodium 0.07 % SOLN SMARTSIG:1 Drop(s) In Eye(s) Every 12 Hours     carvedilol  (COREG ) 12.5 MG tablet TAKE 1 TABLET BY MOUTH TWICE A DAY 180 tablet 3   Cholecalciferol 50 MCG (2000 UT) CAPS Take by mouth.     Cyanocobalamin  (VITAMIN B-12) 500 MCG SUBL 1 sl qd 100 tablet 5   cycloSPORINE (RESTASIS) 0.05 % ophthalmic emulsion Place 1 drop into both eyes 2 (two) times daily.     Diclofenac  Sodium 1.5 % SOLN USE 15 DROPS THREE TIMES DAILY EXTERNALLY FOR PAIN 150 mL 5   dicyclomine  (BENTYL ) 10 MG capsule TAKE 1 CAPSULE (10 MG TOTAL) BY MOUTH EVERY 8 (EIGHT) HOURS AS NEEDED FOR SPASMS. 270 capsule 3   diphenhydrAMINE (BENADRYL) 25 MG tablet Take 25 mg by mouth every 6 (six) hours as needed for allergies.     doxazosin  (CARDURA ) 4 MG tablet TAKE 1 TABLET BY MOUTH EVERY DAY 90 tablet 1   doxycycline  (VIBRA -TABS) 100 MG tablet Take 1 tablet (100 mg total) by mouth 2 (two) times daily. 20 tablet 0   gabapentin  (NEURONTIN ) 100 MG capsule TAKE 1 CAPSULE BY MOUTH THREE TIMES A DAY AS NEEDED 90 capsule 3   ipratropium (ATROVENT ) 0.06 % nasal spray Place 1 spray into both nostrils 3 (three) times daily as needed for rhinitis. 15 mL 5   levocetirizine (XYZAL ) 5 MG tablet Take 1 tablet (5 mg total) by mouth every evening. 30 tablet 5   LORazepam  (ATIVAN ) 0.5 MG tablet TAKE 1 TABLET (0.5 MG TOTAL) BY MOUTH 2 (TWO) TIMES DAILY AS NEEDED FOR ANXIETY OR SLEEP. 60 tablet 2   omeprazole  (PRILOSEC ) 40 MG  capsule Take 1 capsule (40 mg total) by mouth 2 (two) times daily. 180 capsule 3   pseudoephedrine  (SUDAFED) 120 MG 12 hr tablet Take by mouth.     telmisartan -hydrochlorothiazide (MICARDIS  HCT) 80-25 MG tablet Take 1 tablet by mouth daily. 90 tablet 3   traZODone  (DESYREL ) 50 MG tablet TAKE 1 TABLET BY MOUTH EVERYDAY AT BEDTIME 90 tablet 3   valACYclovir  (VALTREX ) 500 MG tablet TAKE 1 TABLET BY MOUTH TWICE A DAY 60 tablet 1   Vitamin D , Cholecalciferol, 1000 units CAPS Take 2,000 Units by mouth daily.     cyclobenzaprine  (FLEXERIL ) 5 MG tablet Take 1 tablet (5 mg total) by mouth 3 (three) times daily as needed for muscle spasms. 60 tablet 2   oxyCODONE  (ROXICODONE ) 5 MG immediate release tablet Take 1 tablet (5 mg total) by mouth every 8 (eight) hours as needed for severe pain (pain score 7-10). 90 tablet 0   Potassium Chloride  ER 20 MEQ TBCR Take 1 tablet (20 mEq total) by mouth daily. 90 tablet 3   sucralfate  (CARAFATE ) 1 g tablet TAKE 1 TABLET BY MOUTH 3 TIMES A DAY BETWEEN MEALS 270 tablet 3   DULoxetine  (CYMBALTA ) 20 MG capsule TAKE 1 CAPSULE BY MOUTH EVERY DAY (Patient not taking: Reported on  04/10/2024) 90 capsule 1   No facility-administered medications prior to visit.    ROS: Review of Systems  Constitutional:  Positive for fatigue. Negative for activity change, appetite change, chills and unexpected weight change.  HENT:  Negative for congestion, mouth sores and sinus pressure.   Eyes:  Negative for visual disturbance.  Respiratory:  Negative for cough and chest tightness.   Gastrointestinal:  Positive for abdominal pain. Negative for nausea.  Genitourinary:  Negative for difficulty urinating, frequency and vaginal pain.  Musculoskeletal:  Positive for arthralgias. Negative for back pain.  Skin:  Negative for pallor and rash.  Neurological:  Positive for dizziness and headaches. Negative for tremors, weakness and numbness.  Hematological:  Does not bruise/bleed easily.   Psychiatric/Behavioral:  Positive for dysphoric mood. Negative for confusion, decreased concentration, sleep disturbance and suicidal ideas. The patient is nervous/anxious.     Objective:  BP 130/87   Pulse 72   Temp 97.8 F (36.6 C) (Oral)   Ht 5' 4 (1.626 m)   Wt 167 lb (75.8 kg)   SpO2 100%   BMI 28.67 kg/m   BP Readings from Last 3 Encounters:  04/10/24 130/87  01/18/24 101/65  01/03/24 128/82    Wt Readings from Last 3 Encounters:  04/10/24 167 lb (75.8 kg)  01/18/24 170 lb (77.1 kg)  11/30/23 168 lb (76.2 kg)    Physical Exam Constitutional:      General: Karina Newman is not in acute distress.    Appearance: Karina Newman is well-developed.  HENT:     Head: Normocephalic.     Right Ear: External ear normal.     Left Ear: External ear normal.     Nose: Nose normal.  Eyes:     General:        Right eye: No discharge.        Left eye: No discharge.     Conjunctiva/sclera: Conjunctivae normal.     Pupils: Pupils are equal, round, and reactive to light.  Neck:     Thyroid : No thyromegaly.     Vascular: No JVD.     Trachea: No tracheal deviation.  Cardiovascular:     Rate and Rhythm: Normal rate and regular rhythm.     Heart sounds: Normal heart sounds.  Pulmonary:     Effort: No respiratory distress.     Breath sounds: No stridor. No wheezing.  Abdominal:     General: Bowel sounds are normal. There is no distension.     Palpations: Abdomen is soft. There is no mass.     Tenderness: There is no abdominal tenderness. There is no guarding or rebound.  Musculoskeletal:        General: Tenderness present.     Cervical back: Normal range of motion and neck supple. No rigidity.  Lymphadenopathy:     Cervical: No cervical adenopathy.  Skin:    Findings: No erythema, lesion or rash.  Neurological:     Mental Status: Karina Newman is oriented to person, place, and time.     Cranial Nerves: No cranial nerve deficit.     Motor: No abnormal muscle tone.     Coordination: Coordination  normal.     Deep Tendon Reflexes: Reflexes normal.  Psychiatric:        Behavior: Behavior normal.        Thought Content: Thought content normal.        Judgment: Judgment normal.   R dorsal hand pain  Lab Results  Component Value Date  WBC 7.5 11/21/2023   HGB 13.9 11/21/2023   HCT 41.5 11/21/2023   PLT 347.0 11/21/2023   GLUCOSE 115 (H) 11/21/2023   CHOL 161 12/04/2020   TRIG 101.0 12/04/2020   HDL 40.20 12/04/2020   LDLCALC 101 (H) 12/04/2020   ALT 39 (H) 11/21/2023   AST 22 11/21/2023   NA 140 11/21/2023   K 3.8 11/21/2023   CL 102 11/21/2023   CREATININE 0.72 11/21/2023   BUN 9 11/21/2023   CO2 31 11/21/2023   TSH 0.33 (L) 11/21/2023   HGBA1C 6.2 11/21/2023    DG Bone Density Result Date: 02/06/2024 EXAM: DUAL X-RAY ABSORPTIOMETRY (DXA) FOR BONE MINERAL DENSITY 02/06/2024 10:42 am CLINICAL DATA:  68 year old Female Postmenopausal. Screening for osteoporosis TECHNIQUE: An axial (e.g., hips, spine) and/or appendicular (e.g., radius) exam was performed, as appropriate, using [employer] Secretary/administrator at [employer]. Images are obtained for bone mineral density measurement and are not obtained for diagnostic purposes. [id number] Exclusions: L3 due to degenerative changes COMPARISON:  None. FINDINGS: Scan quality: Good. LUMBAR SPINE (L1-L2, L4): BMD (in g/cm2): 0.997 T-score: -1.5 Z-score: -1.0 LEFT FEMORAL NECK: BMD (in g/cm2): 0.801 T-score: -1.7 Z-score: -1.3 LEFT TOTAL HIP: BMD (in g/cm2): 0.777 T-score: -1.8 Z-score: -1.8 RIGHT FEMORAL NECK: BMD (in g/cm2): 0.774 T-score: -1.9 Z-score: -1.5 RIGHT TOTAL HIP: BMD (in g/cm2): 0.773 T-score: -1.9 Z-score: -1.8 FRAX 10-YEAR PROBABILITY OF FRACTURE: 10-year fracture risk is performed using the [employer] FRAX calculator based on patient-reported risk factors. Major osteoporotic fracture: 4.7% Hip fracture: 0.7% Other situations known to alter the reliability of the FRAX score should be considered  when making treatment decisions, including chronic glucocorticoid use and past treatments. Further guidance on treatment can be found at the [facility]'s website [email or web address]/. IMPRESSION: Osteopenia based on BMD. Fracture risk is increased. Increased risk is based on low BMD. RECOMMENDATIONS: 1. All patients should optimize calcium and vitamin D  intake. 2. Consider FDA-approved medical therapies in postmenopausal women and men aged 12 years and older, based on the following: - A hip or vertebral (clinical or morphometric) fracture - T-score less than or equal to -2.5 and secondary causes have been excluded. - Low bone mass (T-score between -1.0 and -2.5) and a 10-year probability of a hip fracture greater than or equal to 3% or a 10-year probability of a major osteoporosis-related fracture greater than or equal to 20% based on the [place] -adapted WHO algorithm. - Clinician judgment and/or patient preferences may indicate treatment for people with 10-year fracture probabilities above or below these levels 3. Patients with diagnosis of osteoporosis or at high risk for fracture should have regular bone mineral density tests. For patients eligible for Medicare, routine testing is allowed once every 2 years. The testing frequency can be increased to one year for patients who have rapidly progressing disease, those who are receiving or discontinuing medical therapy to restore bone mass, or have additional risk factors. Electronically Signed   By: Dina  Arceo M.D.   On: 02/06/2024 12:25    Assessment & Plan:   Problem List Items Addressed This Visit     Abdominal pain   Pt saw Dr Leigh: Karina Newman had EGD/colon done      Autoimmune gastritis   Pt saw Dr Leigh: Karina Newman had EGD/colon done Repeat EGD in 3 years      Bilateral shoulder pain - Primary    Percocet prn  Potential benefits of a long term opioids use as well as potential  risks (i.e. addiction risk, apnea etc) and complications  (i.e. Somnolence, constipation and others) were explained to the patient and were aknowledged.  try Lion's Mane Mushroom extract or capsules for memory      Early satiety   Pt saw Dr Leigh: Karina Newman had EGD/colon done      Fatty liver   Last US , [place] 2022 - mild fatty liver No alcohol, Tylenol , good diet      Low vitamin B12 level   On B12      Vitamin D  deficiency   On Vit D      Other Visit Diagnoses       Potassium (K) deficiency       Relevant Medications   Potassium Chloride  ER 20 MEQ TBCR         Meds ordered this encounter  Medications   cyclobenzaprine  (FLEXERIL ) 5 MG tablet    Sig: Take 1 tablet (5 mg total) by mouth 3 (three) times daily as needed for muscle spasms.    Dispense:  60 tablet    Refill:  2   oxyCODONE  (ROXICODONE ) 5 MG immediate release tablet    Sig: Take 1 tablet (5 mg total) by mouth every 8 (eight) hours as needed for severe pain (pain score 7-10).    Dispense:  90 tablet    Refill:  0   Potassium Chloride  ER 20 MEQ TBCR    Sig: Take 1 tablet (20 mEq total) by mouth daily.    Dispense:  90 tablet    Refill:  3   sucralfate  (CARAFATE ) 1 g tablet    Sig: TAKE 1 TABLET BY MOUTH 3 TIMES A DAY BETWEEN MEALS    Dispense:  270 tablet    Refill:  3      Follow-up: Return in about 3 months (around 07/11/2024) for a follow-up visit.  Marolyn Noel, MD

## 2024-04-10 NOTE — Patient Instructions (Signed)

## 2024-04-10 NOTE — Assessment & Plan Note (Signed)
Percocet prn  Potential benefits of a long term opioids use as well as potential risks (i.e. addiction risk, apnea etc) and complications (i.e. Somnolence, constipation and others) were explained to the patient and were aknowledged.  try Lion's Mane Mushroom extract or capsules for memory

## 2024-04-11 ENCOUNTER — Other Ambulatory Visit: Payer: Self-pay | Admitting: Internal Medicine

## 2024-04-13 ENCOUNTER — Ambulatory Visit: Admitting: Internal Medicine

## 2024-04-13 ENCOUNTER — Other Ambulatory Visit: Payer: Self-pay

## 2024-04-13 ENCOUNTER — Encounter: Payer: Self-pay | Admitting: Internal Medicine

## 2024-04-13 VITALS — BP 144/80 | HR 74 | Temp 98.4°F | Ht 64.0 in | Wt 169.9 lb

## 2024-04-13 DIAGNOSIS — J3089 Other allergic rhinitis: Secondary | ICD-10-CM

## 2024-04-13 DIAGNOSIS — K219 Gastro-esophageal reflux disease without esophagitis: Secondary | ICD-10-CM

## 2024-04-13 MED ORDER — LEVOCETIRIZINE DIHYDROCHLORIDE 5 MG PO TABS: 5.0000 mg | ORAL_TABLET | Freq: Every evening | ORAL | 5 refills | Status: AC

## 2024-04-13 MED ORDER — TRIAMCINOLONE ACETONIDE 55 MCG/ACT NA AERO: 2.0000 | INHALATION_SPRAY | Freq: Every day | NASAL | 5 refills | Status: AC

## 2024-04-13 MED ORDER — IPRATROPIUM BROMIDE 0.06 % NA SOLN: 1.0000 | Freq: Three times a day (TID) | NASAL | 5 refills | Status: AC | PRN

## 2024-04-13 NOTE — Progress Notes (Signed)
 FOLLOW UP Date of Service/Encounter:  04/13/24   Subjective:  Karina Newman (DOB: 10/03/1955) is a 68 y.o. female who [facility] for follow up for allergic rhinitis and GERD.   History obtained from: chart review and patient. Last seen on 09/23/2023 with me for uncontrolled rhinitis, use Nasonex and Ipratroprium with Xyzal .  Also for GERD, use PPI, consider GI follow up if no improvement.  Reports allergies are up and down.  Does note some congestion and drainage still but it is better.  Using Flonase  PRN, did not switch to Nasonex or Nasacort .  Using Xyzal  daily and Ipratropium PRN.  She is worried about long term effects of pills on her liver as she has family hx of liver cancer.   Reflux is much better. Seen by GI, increased to Omeprazole  BID.  Had EGD 01/2024 with gastric mucosal atrophy.      Past Medical History: Past Medical History:  Diagnosis Date   Abnormal cervical Pap smear with positive HPV DNA test 01/2014,01/2017   2015 Normal cytology with positive high-risk HPV 18/45. Colposcopy negative with negative ECC.  2018 normal cytology with positive high-risk HPV 18/45   Anemia    Apnea 05/17/2018   Autoimmune gastritis    Cataract    Fatty liver    Gastritis    Heart murmur    Hypertension    Hyperthyroidism    Inappropriate sinus tachycardia (HCC) 05/17/2018   Internal hemorrhoids    Osteoarthritis    Right shoulder pain 2001   chronic pain   Thyroid  disease    Hyperthyroid. Was on PTU (Dr Viviana in [place]) - stopped in 2013, stable   Tubular adenoma of colon     Objective:  BP (!) 144/80 (BP Location: Left Arm, Patient Position: Sitting, Cuff Size: Normal)   Pulse 74   Temp 98.4 F (36.9 C) (Temporal)   Ht 5' 4 (1.626 m)   Wt 169 lb 14.4 oz (77.1 kg)   SpO2 98%   BMI 29.16 kg/m  Body mass index is 29.16 kg/m. Physical Exam: GEN: alert, well developed HEENT: clear conjunctiva, nose with mild inferior turbinate  hypertrophy, pink nasal mucosa, slight clear rhinorrhea, no cobblestoning HEART: regular rate and rhythm, no murmur LUNGS: clear to auscultation bilaterally, no coughing, unlabored respiration SKIN: no rashes or lesions  Assessment:   1. Perennial allergic rhinitis   2. Gastroesophageal reflux disease without esophagitis     Plan/Recommendations:  Allergic Rhinitis: - Improved, discussed switching INCS and OAH; Unable to do azelastine  in the past due to nasal numbness; Flonase  due to taste/scent, Zyrtec  due to upsetting stomach.  - Positive skin test 12/2022: mold, cat, cockroach.  - Use nasal saline rinses before nose sprays such as with Neilmed Sinus Rinse.  Use distilled water.   - Use Nasacort  2 sprays each nostril daily. Aim upward and outward. This replaces Flonase .  - Use Ipratroprium 1-2 sprays up to three times daily as needed for runny nose or drainage. Aim upward and outward. - Use Xyzal  5 mg daily as needed for runny nose, sneezing, itchy symptoms. Can take second dose if needed.    - Consider allergy  shots as long term control of your symptoms by teaching your immune system to be more tolerant of your allergy  triggers   GERD - Improved - Continue follow up with GI.  -Continue omperazole 40mg  twice daily on an empty stomach.  Eat a snack or breakfast about 45 minutes later.  -  Avoid lying down for at least two hours after a meal or after drinking acidic beverages, like soda, or other caffeinated beverages. This can help to prevent stomach contents from flowing back into the esophagus. -Keep your head elevated while you sleep. Using an extra pillow or two can also help to prevent reflux. -Eat smaller and more frequent meals each day instead of a few large meals. This promotes digestion and can aid in preventing heartburn. -Wear loose-fitting clothes to ease pressure on the stomach, which can worsen heartburn and reflux. -Reduce excess weight around the midsection. This can ease  pressure on the stomach. Such pressure can force some stomach contents back up the esophagus.     Return in about 6 months (around 10/13/2024).  [name], MD [facility] 

## 2024-04-13 NOTE — Patient Instructions (Addendum)
 Allergic Rhinitis: - Positive skin test 12/2022: mold, cat, cockroach.  - Use nasal saline rinses before nose sprays such as with Neilmed Sinus Rinse.  Use distilled water.   - Use Nasacort  2 sprays each nostril daily. Aim upward and outward. This replaces Flonase .  - Use Ipratroprium 1-2 sprays up to three times daily as needed for runny nose or drainage. Aim upward and outward. - Use Xyzal  5 mg daily as needed for runny nose, sneezing, itchy symptoms. Can take second dose if needed.    - Consider allergy  shots as long term control of your symptoms by teaching your immune system to be more tolerant of your allergy  triggers   GERD - Continue follow up with GI.  -Continue omperazole 40mg  twice daily on an empty stomach.  Eat a snack or breakfast about 45 minutes later.  -Avoid lying down for at least two hours after a meal or after drinking acidic beverages, like soda, or other caffeinated beverages. This can help to prevent stomach contents from flowing back into the esophagus. -Keep your head elevated while you sleep. Using an extra pillow or two can also help to prevent reflux. -Eat smaller and more frequent meals each day instead of a few large meals. This promotes digestion and can aid in preventing heartburn. -Wear loose-fitting clothes to ease pressure on the stomach, which can worsen heartburn and reflux. -Reduce excess weight around the midsection. This can ease pressure on the stomach. Such pressure can force some stomach contents back up the esophagus.

## 2024-04-17 ENCOUNTER — Other Ambulatory Visit: Payer: Self-pay | Admitting: Internal Medicine

## 2024-04-17 DIAGNOSIS — Z961 Presence of intraocular lens: Secondary | ICD-10-CM | POA: Diagnosis not present

## 2024-04-17 DIAGNOSIS — H35351 Cystoid macular degeneration, right eye: Secondary | ICD-10-CM | POA: Diagnosis not present

## 2024-04-17 DIAGNOSIS — H20041 Secondary noninfectious iridocyclitis, right eye: Secondary | ICD-10-CM | POA: Diagnosis not present

## 2024-04-17 DIAGNOSIS — H35373 Puckering of macula, bilateral: Secondary | ICD-10-CM | POA: Diagnosis not present

## 2024-05-03 DIAGNOSIS — H59031 Cystoid macular edema following cataract surgery, right eye: Secondary | ICD-10-CM | POA: Diagnosis not present

## 2024-05-03 DIAGNOSIS — H16223 Keratoconjunctivitis sicca, not specified as Sjogren's, bilateral: Secondary | ICD-10-CM | POA: Diagnosis not present

## 2024-05-03 DIAGNOSIS — H20041 Secondary noninfectious iridocyclitis, right eye: Secondary | ICD-10-CM | POA: Diagnosis not present

## 2024-05-16 NOTE — Progress Notes (Unsigned)
 Office Visit Note  Patient: Karina Newman             Date of Birth: 1956-07-20           MRN: [id number]             PCP: Garald Karlynn GAILS, MD Referring: Garald Karlynn GAILS, MD Visit Date: 05/30/2024 Occupation: Data Unavailable  Subjective:  Pain in multiple joints   History of Present Illness: Karina Newman is a 68 y.o. female with history of osteoarthritis.  Patient presents today with increased arthralgias involving multiple joints.  Patient underwent viscosupplementation for both knees at the end of May/early June 2025 which provided significant relief.  The discomfort in her knee joints have started to recur.  She has not noticed any joint swelling.  She has been taking oxycodone  and gabapentin  for chronic pain relief. Patient has been experiencing increased pain and stiffness in the right hand. She denies any joint swelling.  She denies any injury prior to the onset of symptoms.    Activities of Daily Living:  Patient reports morning stiffness for less than 5 minutes.   Patient Reports nocturnal pain.  Difficulty dressing/grooming: Reports Difficulty climbing stairs: Denies Difficulty getting out of chair: Denies Difficulty using hands for taps, buttons, cutlery, and/or writing: Reports  Review of Systems  Constitutional:  Positive for fatigue.  HENT:  Negative for mouth sores and mouth dryness.   Eyes:  Positive for dryness.  Respiratory:  Negative for shortness of breath.   Cardiovascular:  Positive for chest pain. Negative for palpitations.  Gastrointestinal:  Negative for blood in stool, constipation and diarrhea.  Endocrine: Negative for increased urination.  Genitourinary:  Negative for involuntary urination.  Musculoskeletal:  Positive for joint pain, joint pain, joint swelling, myalgias, muscle weakness, morning stiffness, muscle tenderness and myalgias. Negative for gait problem.  Skin:  Positive for color change and sensitivity to sunlight. Negative for  rash and hair loss.  Allergic/Immunologic: Negative for susceptible to infections.  Neurological:  Positive for headaches. Negative for dizziness.  Hematological:  Negative for swollen glands.  Psychiatric/Behavioral:  Positive for sleep disturbance. Negative for depressed mood. The patient is not nervous/anxious.     PMFS History:  Patient Active Problem List   Diagnosis Date Noted   Osteoarthritis    History of colonic polyps 11/16/2023   Autoimmune gastritis 11/16/2023   Early satiety 11/16/2023   Elevated ALT measurement 06/15/2023   Hyperglycemia 06/15/2023   Nasal congestion 11/02/2022   Laryngopharyngeal reflux 10/13/2022   Gastritis 03/01/2022   Toe pain, chronic, right 09/17/2020   Grief 07/24/2020   Dysphagia 01/02/2020   Globus sensation 01/02/2020   Thoracic back pain 12/19/2019   Sore throat 10/24/2019   Goiter 10/24/2019   Cough 10/24/2019   Neoplasm of uncertain behavior of skin 09/27/2019   Pain of right breast 06/03/2019   Fatty liver 01/23/2019   Hand pain 09/28/2018   Knee pain 09/28/2018   Inappropriate sinus tachycardia 05/17/2018   Apnea 05/17/2018   Upper respiratory infection 09/23/2017   Fatigue 09/12/2017   Well adult exam 01/19/2017   Abdominal pain 01/19/2017   Headache 10/27/2016   Pain in right shoulder 09/16/2016   Left hip pain 09/16/2016   Low vitamin B12 level 03/16/2016   Piriformis syndrome of right side 02/04/2016   IT band syndrome 02/04/2016   RUQ abdominal pain 02/04/2016   Dysfunction of both eustachian tubes 12/11/2015   Hoarseness 12/11/2015   Acute serous otitis media of left ear  10/08/2015   Low back pain radiating to right lower extremity 09/19/2015   Allergic rhinitis 09/19/2015   Atypical chest pain 04/07/2015   GERD (gastroesophageal reflux disease) 04/07/2015   Axillary adenitis 03/23/2015   Migraines 03/23/2015   Hives 01/29/2015   Edema 01/29/2015   Cervical disc disorder with radiculopathy of cervical region  12/11/2014   Insomnia 07/31/2014   Degenerative cervical disc 05/08/2014   Pain in joint, shoulder region 03/05/2014   Patellofemoral arthralgia of both knees 03/01/2014   Bilateral shoulder pain 10/18/2013   Sinusitis, bacterial 08/20/2013   URI, acute 08/14/2013   Hemoptysis 08/14/2013   Otitis media of left ear 08/14/2013   Labral tear of shoulder 07/03/2013   Hyperthyroidism 07/03/2013   HTN (hypertension), benign 07/03/2013   LVH (left ventricular hypertrophy) due to hypertensive disease 07/03/2013   Vitamin D  deficiency 07/03/2013    Past Medical History:  Diagnosis Date   Abnormal cervical Pap smear with positive HPV DNA test 01/2014,01/2017   2015 Normal cytology with positive high-risk HPV 18/45. Colposcopy negative with negative ECC.  2018 normal cytology with positive high-risk HPV 18/45   Anemia    Apnea 05/17/2018   Autoimmune gastritis    Cataract    Fatty liver    Gastritis    Heart murmur    Hypertension    Hyperthyroidism    Inappropriate sinus tachycardia 05/17/2018   Internal hemorrhoids    Osteoarthritis    Right shoulder pain 2001   chronic pain   Thyroid  disease    Hyperthyroid. Was on PTU (Dr Viviana in [place]) - stopped in 2013, stable   Tubular adenoma of colon     Family History  Problem Relation Age of Onset   Kidney disease Mother        ESRD   Hypertension Mother    Lung cancer Father    Hypertension Sister    Kidney disease Sister    Hypertension Sister    Diabetes Brother    Hypertension Brother    Hypertension Brother    Diabetes Paternal Grandmother    Sleep apnea Son    Hypertension Son    Colon cancer Neg Hx    Stomach cancer Neg Hx    Esophageal cancer Neg Hx    Rectal cancer Neg Hx    Pancreatic cancer Neg Hx    Past Surgical History:  Procedure Laterality Date   BIOPSY  03/11/2022   Procedure: BIOPSY;  Surgeon: Wilhelmenia, Aloha Raddle., MD;  Location: [facility] ENDOSCOPY;  Service: Gastroenterology;;   BREAST CYST ASPIRATION Left  2012   CATARACT EXTRACTION Right 06/15/2022   CATARACT EXTRACTION Left 06/28/2022   CESAREAN SECTION     x 4    COLONOSCOPY     ESOPHAGOGASTRODUODENOSCOPY (EGD) WITH PROPOFOL  N/A 03/11/2022   Procedure: ESOPHAGOGASTRODUODENOSCOPY (EGD) WITH PROPOFOL ;  Surgeon: Wilhelmenia Aloha Raddle., MD;  Location: [facility] ENDOSCOPY;  Service: Gastroenterology;  Laterality: N/A;   EUS N/A 03/11/2022   Procedure: UPPER ENDOSCOPIC ULTRASOUND (EUS) RADIAL;  Surgeon: Wilhelmenia Aloha Raddle., MD;  Location: [facility] ENDOSCOPY;  Service: Gastroenterology;  Laterality: N/A;   HAND SURGERY Left    SHOULDER SURGERY Right 2002   SHOULDER SURGERY Left 2014   TUBAL LIGATION     UPPER GASTROINTESTINAL ENDOSCOPY     Vitrectomy surgery Right 03/21/2023   Also cyst removal/ Lt eye done 04/21/2023   Social History   Tobacco Use   Smoking status: Never    Passive exposure: Never   Smokeless tobacco: Never  Vaping  Use   Vaping status: Never Used  Substance Use Topics   Alcohol use: Yes    Comment: RARELY   Drug use: No   Social History   Social History Narrative   Live with husbands     Immunization History  Administered Date(s) Administered   Tdap 10/17/2021   Zoster Recombinant(Shingrix ) 03/25/2017, 06/27/2017     Objective: Vital Signs: BP 123/75   Pulse 74   Temp 97.8 F (36.6 C)   Resp 16   Ht 5' 4 (1.626 m)   Wt 169 lb 12.8 oz (77 kg)   BMI 29.15 kg/m    Physical Exam Vitals and nursing note reviewed.  Constitutional:      Appearance: She is well-developed.  HENT:     Head: Normocephalic and atraumatic.  Eyes:     Conjunctiva/sclera: Conjunctivae normal.  Cardiovascular:     Rate and Rhythm: Normal rate and regular rhythm.     Heart sounds: Normal heart sounds.  Pulmonary:     Effort: Pulmonary effort is normal.     Breath sounds: Normal breath sounds.  Abdominal:     General: Bowel sounds are normal.     Palpations: Abdomen is soft.  Musculoskeletal:     Cervical back: Normal range  of motion.  Lymphadenopathy:     Cervical: No cervical adenopathy.  Skin:    General: Skin is warm and dry.     Capillary Refill: Capillary refill takes less than 2 seconds.  Neurological:     Mental Status: She is alert and oriented to person, place, and time.  Psychiatric:        Behavior: Behavior normal.      Musculoskeletal Exam: C-spine has limited range of motion with lateral rotation especially to the right.  Shoulder joints, elbow joints, wrist joints, MCPs, PIPs, DIPs have good range of motion with no synovitis.  Tenderness of the right index DIP joint and right second MCP joint.  Complete fist formation bilaterally.  Hip joints have good range of motion with no groin pain.  Knee joints have good range of motion no warmth or effusion.  Ankle joints have good range of motion no tenderness or joint swelling.  No evidence of Achilles tendinitis or plantar fasciitis.   CDAI Exam: CDAI Score: -- Patient Global: --; Provider Global: -- Swollen: --; Tender: -- Joint Exam 05/30/2024   No joint exam has been documented for this visit   There is currently no information documented on the homunculus. Go to the Rheumatology activity and complete the homunculus joint exam.  Investigation: No additional findings.  Imaging: No results found.  Recent Labs: Lab Results  Component Value Date   WBC 7.5 11/21/2023   HGB 13.9 11/21/2023   PLT 347.0 11/21/2023   NA 140 11/21/2023   K 3.8 11/21/2023   CL 102 11/21/2023   CO2 31 11/21/2023   GLUCOSE 115 (H) 11/21/2023   BUN 9 11/21/2023   CREATININE 0.72 11/21/2023   BILITOT 0.4 11/21/2023   ALKPHOS 78 11/21/2023   AST 22 11/21/2023   ALT 39 (H) 11/21/2023   PROT 7.9 11/21/2023   ALBUMIN 4.4 11/21/2023   CALCIUM 9.7 11/21/2023   GFRAA 78 06/20/2018    Speciality Comments: No specialty comments available.  Procedures:  No procedures performed Allergies: Penicillins, Tramadol , Iodine, Lyrica  [pregabalin ], Voltaren   [diclofenac ], Lexapro  [escitalopram ], and Nsaids   Assessment / Plan:     Visit Diagnoses: Primary osteoarthritis of both knees - She presents today with increased  pain involving both knees.  No recent injury or fall.  No mechanical symptoms. She underwent viscosupplementation for both knees at the end of May/early June 2025.  She noticed a significant improvement in her symptoms but her symptoms have started to recur.  On examination she has good range of motion of both knee joints.  No warmth or effusion noted. She has been experiencing some radiating pain in both knees.  Offered to update x-rays today but she would like to hold off at this time.  She would like to reapply for viscosupplementation once eligible due to a great response in the past.  This patient is diagnosed with osteoarthritis of the knee(s).    Radiographs show evidence of joint space narrowing, osteophytes, subchondral sclerosis and/or subchondral cysts.  This patient has knee pain which interferes with functional and activities of daily living.    This patient has experienced inadequate response, adverse effects and/or intolerance with conservative treatments such as acetaminophen , NSAIDS, topical creams, physical therapy or regular exercise, knee bracing and/or weight loss.   This patient has experienced inadequate response or has a contraindication to intra articular steroid injections for at least 3 months.   This patient is not scheduled to have a total knee replacement within 6 months of starting treatment with viscosupplementation.  Positive ANA (antinuclear antibody) - ANA 1:40 cytoplasmic 10/21/21.  Repeat ANA was negative on July 30 was 2024.  Double-stranded DNA negative, complements normal: No clinical features of systemic lupus.   Plan to recheck lab work today.   - Plan: CBC with Differential/Platelet, Comprehensive metabolic panel with GFR, Sedimentation rate, C-reactive protein, ANA, C3 and C4, Anti-Smith  antibody, RNP Antibody, Anti-DNA antibody, double-stranded  Pain in both hands - X-rays of both hands were consistent with osteoarthritis on 10/21/2021. She has been experiencing increased pain and stiffness in the right hand.  She has tenderness of the right second MCP joint and the DIP of the right index finger.  No synovitis or dactylitis noted.  Plan to check the following lab work today for further evaluation.- Plan: Sedimentation rate, C-reactive protein, Rheumatoid factor, Cyclic citrul peptide antibody, IgG  Chronic pain of both knees - X-rays 11/15/2022 consistent with moderate osteoarthritis moderate chondromalacia patella.  No radiographic progression compared to x-rays from 2021.  No warmth or effusion noted.  Plan to reapply for viscosupplementation when she is eligible.  Chronic right shoulder pain - Under the care of [facility].  Patient recently completed physical therapy.  Patient is been experiencing increased right sided trapezius muscle tension and tenderness.  Trochanteric bursitis of both hips: Intermittent discomfort.   Plantar fasciitis, bilateral: Not currently symptomatic.  She is wearing proper fitting shoes.  DDD (degenerative disc disease), cervical: C-spine has limited range of motion with lateral rotation.  Trapezius muscle tension and  tenderness bilaterally, right greater than left.  Other medical conditions are listed as follows:  Vitreous floaters of right eye  Vitamin D  deficiency  HTN (hypertension), benign: Blood pressure 123/75 today in the office.  Fatty liver  Other insomnia  Hypertensive left ventricular hypertrophy, without heart failure  Tubular adenoma of colon  Hyperthyroidism  History of gastroesophageal reflux (GERD)  Orders: Orders Placed This Encounter  Procedures   CBC with Differential/Platelet   Comprehensive metabolic panel with GFR   Sedimentation rate   C-reactive protein   ANA   C3 and C4   Anti-Smith  antibody   RNP Antibody   Anti-DNA antibody, double-stranded   Rheumatoid factor  Cyclic citrul peptide antibody, IgG   No orders of the defined types were placed in this encounter.    Follow-Up Instructions: Return in about 6 months (around 11/28/2024) for Osteoarthritis.   Waddell CHRISTELLA Craze, PA-C  Note - This record has been created using Dragon software.  Chart creation errors have been sought, but may not always  have been located. Such creation errors do not reflect on  the standard of medical care.

## 2024-05-21 ENCOUNTER — Other Ambulatory Visit: Payer: Self-pay | Admitting: Internal Medicine

## 2024-05-25 DIAGNOSIS — H30041 Focal chorioretinal inflammation, macular or paramacular, right eye: Secondary | ICD-10-CM | POA: Diagnosis not present

## 2024-05-25 DIAGNOSIS — H35363 Drusen (degenerative) of macula, bilateral: Secondary | ICD-10-CM | POA: Diagnosis not present

## 2024-05-25 DIAGNOSIS — H35373 Puckering of macula, bilateral: Secondary | ICD-10-CM | POA: Diagnosis not present

## 2024-05-25 DIAGNOSIS — H31012 Macula scars of posterior pole (postinflammatory) (post-traumatic), left eye: Secondary | ICD-10-CM | POA: Diagnosis not present

## 2024-05-30 ENCOUNTER — Ambulatory Visit: Attending: Physician Assistant | Admitting: Physician Assistant

## 2024-05-30 ENCOUNTER — Encounter: Payer: Self-pay | Admitting: Physician Assistant

## 2024-05-30 VITALS — BP 123/75 | HR 74 | Temp 97.8°F | Resp 16 | Ht 64.0 in | Wt 169.8 lb

## 2024-05-30 DIAGNOSIS — M7061 Trochanteric bursitis, right hip: Secondary | ICD-10-CM | POA: Diagnosis not present

## 2024-05-30 DIAGNOSIS — I10 Essential (primary) hypertension: Secondary | ICD-10-CM

## 2024-05-30 DIAGNOSIS — M5030 Other cervical disc degeneration, unspecified cervical region: Secondary | ICD-10-CM | POA: Diagnosis not present

## 2024-05-30 DIAGNOSIS — E559 Vitamin D deficiency, unspecified: Secondary | ICD-10-CM | POA: Diagnosis not present

## 2024-05-30 DIAGNOSIS — M25561 Pain in right knee: Secondary | ICD-10-CM

## 2024-05-30 DIAGNOSIS — M79642 Pain in left hand: Secondary | ICD-10-CM | POA: Diagnosis not present

## 2024-05-30 DIAGNOSIS — G4709 Other insomnia: Secondary | ICD-10-CM

## 2024-05-30 DIAGNOSIS — R7689 Other specified abnormal immunological findings in serum: Secondary | ICD-10-CM

## 2024-05-30 DIAGNOSIS — E0590 Thyrotoxicosis, unspecified without thyrotoxic crisis or storm: Secondary | ICD-10-CM

## 2024-05-30 DIAGNOSIS — Z8719 Personal history of other diseases of the digestive system: Secondary | ICD-10-CM

## 2024-05-30 DIAGNOSIS — I119 Hypertensive heart disease without heart failure: Secondary | ICD-10-CM

## 2024-05-30 DIAGNOSIS — M79641 Pain in right hand: Secondary | ICD-10-CM

## 2024-05-30 DIAGNOSIS — K760 Fatty (change of) liver, not elsewhere classified: Secondary | ICD-10-CM | POA: Diagnosis not present

## 2024-05-30 DIAGNOSIS — H43391 Other vitreous opacities, right eye: Secondary | ICD-10-CM | POA: Diagnosis not present

## 2024-05-30 DIAGNOSIS — M722 Plantar fascial fibromatosis: Secondary | ICD-10-CM

## 2024-05-30 DIAGNOSIS — D126 Benign neoplasm of colon, unspecified: Secondary | ICD-10-CM

## 2024-05-30 DIAGNOSIS — M170 Bilateral primary osteoarthritis of knee: Secondary | ICD-10-CM | POA: Diagnosis not present

## 2024-05-30 DIAGNOSIS — M25511 Pain in right shoulder: Secondary | ICD-10-CM

## 2024-05-30 DIAGNOSIS — G8929 Other chronic pain: Secondary | ICD-10-CM

## 2024-05-30 DIAGNOSIS — M25562 Pain in left knee: Secondary | ICD-10-CM

## 2024-05-30 DIAGNOSIS — M7062 Trochanteric bursitis, left hip: Secondary | ICD-10-CM

## 2024-05-31 ENCOUNTER — Ambulatory Visit: Payer: Self-pay | Admitting: Physician Assistant

## 2024-05-31 NOTE — Progress Notes (Signed)
 CRP WNL RF negative

## 2024-05-31 NOTE — Progress Notes (Signed)
 Glucose is 140, rest of CMP WNL  CBC WNL Complements WNL  ESR remains borderline elevated but stable

## 2024-06-01 LAB — CBC WITH DIFFERENTIAL/PLATELET
Absolute Lymphocytes: 1581 {cells}/uL (ref 850–3900)
Absolute Monocytes: 364 {cells}/uL (ref 200–950)
Basophils Absolute: 31 {cells}/uL (ref 0–200)
Basophils Relative: 0.6 %
Eosinophils Absolute: 52 {cells}/uL (ref 15–500)
Eosinophils Relative: 1 %
HCT: 41.3 % (ref 35.0–45.0)
Hemoglobin: 13.8 g/dL (ref 11.7–15.5)
MCH: 32.2 pg (ref 27.0–33.0)
MCHC: 33.4 g/dL (ref 32.0–36.0)
MCV: 96.5 fL (ref 80.0–100.0)
MPV: 10.6 fL (ref 7.5–12.5)
Monocytes Relative: 7 %
Neutro Abs: 3172 {cells}/uL (ref 1500–7800)
Neutrophils Relative %: 61 %
Platelets: 324 Thousand/uL (ref 140–400)
RBC: 4.28 Million/uL (ref 3.80–5.10)
RDW: 11.4 % (ref 11.0–15.0)
Total Lymphocyte: 30.4 %
WBC: 5.2 Thousand/uL (ref 3.8–10.8)

## 2024-06-01 LAB — RNP ANTIBODY: Ribonucleic Protein(ENA) Antibody, IgG: <1 AI

## 2024-06-01 LAB — COMPREHENSIVE METABOLIC PANEL WITH GFR
AG Ratio: 1.5 (calc) (ref 1.0–2.5)
ALT: 28 U/L (ref 6–29)
AST: 20 U/L (ref 10–35)
Albumin: 4.4 g/dL (ref 3.6–5.1)
Alkaline phosphatase (APISO): 86 U/L (ref 37–153)
BUN: 13 mg/dL (ref 7–25)
CO2: 29 mmol/L (ref 20–32)
Calcium: 9.3 mg/dL (ref 8.6–10.4)
Chloride: 99 mmol/L (ref 98–110)
Creat: 0.88 mg/dL (ref 0.50–1.05)
Globulin: 3 g/dL (ref 1.9–3.7)
Glucose, Bld: 140 mg/dL — ABNORMAL HIGH (ref 65–99)
Potassium: 3.9 mmol/L (ref 3.5–5.3)
Sodium: 137 mmol/L (ref 135–146)
Total Bilirubin: 0.5 mg/dL (ref 0.2–1.2)
Total Protein: 7.4 g/dL (ref 6.1–8.1)
eGFR: 72 mL/min/1.73m2 (ref >=60)

## 2024-06-01 LAB — C-REACTIVE PROTEIN: CRP: 4.2 mg/L (ref <8.0)

## 2024-06-01 LAB — RHEUMATOID FACTOR: Rheumatoid fact SerPl-aCnc: <10 [IU]/mL (ref <14)

## 2024-06-01 LAB — ANA: Anti Nuclear Antibody (ANA): POSITIVE — AB

## 2024-06-01 LAB — ANTI-DNA ANTIBODY, DOUBLE-STRANDED: ds DNA Ab: 1 [IU]/mL

## 2024-06-01 LAB — ANTI-NUCLEAR AB-TITER (ANA TITER): ANA Titer 1: 1:80 {titer} — ABNORMAL HIGH

## 2024-06-01 LAB — CYCLIC CITRUL PEPTIDE ANTIBODY, IGG: Cyclic Citrullin Peptide Ab: <16 U

## 2024-06-01 LAB — C3 AND C4
C3 Complement: 148 mg/dL (ref 83–193)
C4 Complement: 20 mg/dL (ref 15–57)

## 2024-06-01 LAB — ANTI-SMITH ANTIBODY: ENA SM Ab Ser-aCnc: <1 AI

## 2024-06-01 LAB — SEDIMENTATION RATE: Sed Rate: 31 mm/h — ABNORMAL HIGH (ref 0–30)

## 2024-06-01 NOTE — Progress Notes (Signed)
 Anti-CCP negative  ANA pending

## 2024-06-01 NOTE — Progress Notes (Signed)
 Smith antibody, RNP, and dsDNA negative.  Great news!

## 2024-06-04 ENCOUNTER — Ambulatory Visit: Payer: Self-pay | Admitting: Internal Medicine

## 2024-06-04 NOTE — Progress Notes (Signed)
 ANA remains positive-low titer.

## 2024-06-07 DIAGNOSIS — H30041 Focal chorioretinal inflammation, macular or paramacular, right eye: Secondary | ICD-10-CM | POA: Diagnosis not present

## 2024-06-08 ENCOUNTER — Other Ambulatory Visit: Payer: Self-pay | Admitting: Internal Medicine

## 2024-06-14 ENCOUNTER — Encounter

## 2024-06-15 ENCOUNTER — Telehealth: Payer: Self-pay

## 2024-06-15 ENCOUNTER — Ambulatory Visit (INDEPENDENT_AMBULATORY_CARE_PROVIDER_SITE_OTHER)

## 2024-06-15 VITALS — Ht 64.0 in | Wt 169.0 lb

## 2024-06-15 DIAGNOSIS — Z0000 Encounter for general adult medical examination without abnormal findings: Secondary | ICD-10-CM

## 2024-06-15 NOTE — Telephone Encounter (Signed)
 requesting a refill on med. Need return phonecall by nurse

## 2024-06-15 NOTE — Patient Instructions (Addendum)
 Ms. Karina Newman,  Thank you for taking the time for your Medicare Wellness Visit. I appreciate your continued commitment to your health goals. Please review the care plan we discussed, and feel free to reach out if I can assist you further.  Medicare recommends these wellness visits once per year to help you and your care team stay ahead of potential health issues. These visits are designed to focus on prevention, allowing your provider to concentrate on managing your acute and chronic conditions during your regular appointments.  Please note that Annual Wellness Visits do not include a physical exam. Some assessments may be limited, especially if the visit was conducted virtually. If needed, we may recommend a separate in-person follow-up with your provider.  Ongoing Care Seeing your primary care provider every 3 to 6 months helps us  monitor your health and provide consistent, personalized care.   Referrals If a referral was made during today's visit and you haven't received any updates within two weeks, please contact the referred provider directly to check on the status.  Recommended Screenings:  Health Maintenance  Topic Date Due   Pneumococcal Vaccine for age over 71 (1 of 1 - PCV) Never done   Medicare Annual Wellness Visit  06/15/2025   Breast Cancer Screening  08/14/2025   Colon Cancer Screening  01/18/2027   DTaP/Tdap/Td vaccine (2 - Td or Tdap) 10/18/2031   DEXA scan (bone density measurement)  Completed   Hepatitis C Screening  Completed   Zoster (Shingles) Vaccine  Completed   Meningitis B Vaccine  Aged Out   Flu Shot  Discontinued   COVID-19 Vaccine  Discontinued       06/15/2024   10:55 AM  Advanced Directives  Does Patient Have a Medical Advance Directive? No  Would patient like information on creating a medical advance directive? No - Patient declined   Advance Care Planning is important because it: Ensures you receive medical care that aligns with your values,  goals, and preferences. Provides guidance to your family and loved ones, reducing the emotional burden of decision-making during critical moments.  Vision: Annual vision screenings are recommended for early detection of glaucoma, cataracts, and diabetic retinopathy. These exams can also reveal signs of chronic conditions such as diabetes and high blood pressure.  Dental: Annual dental screenings help detect early signs of oral cancer, gum disease, and other conditions linked to overall health, including heart disease and diabetes.  Managing Pain Without Opioids Opioids are strong medicines used to treat moderate to severe pain. For some people, especially those who have long-term (chronic) pain, opioids may not be the best choice for pain management due to: Side effects like nausea, constipation, and sleepiness. The risk of addiction (opioid use disorder). The longer you take opioids, the greater your risk of addiction. Pain that lasts for more than 3 months is called chronic pain. Managing chronic pain usually requires more than one approach and is often provided by a team of health care providers working together (multidisciplinary approach). Pain management may be done at a pain management center or pain clinic. How to manage pain without the use of opioids Use non-opioid medicines Non-opioid medicines for pain may include: Over-the-counter or prescription non-steroidal anti-inflammatory drugs (NSAIDs). These may be the first medicines used for pain. They work well for muscle and bone pain, and they reduce swelling. Acetaminophen . This over-the-counter medicine may work well for milder pain but not swelling. Antidepressants. These may be used to treat chronic pain. A certain type of antidepressant (  tricyclics) is often used. These medicines are given in lower doses for pain than when used for depression. Anticonvulsants. These are usually used to treat seizures but may also reduce nerve  (neuropathic) pain. Muscle relaxants. These relieve pain caused by sudden muscle tightening (spasms). You may also use a pain medicine that is applied to the skin as a patch, cream, or gel (topical analgesic), such as a numbing medicine. These may cause fewer side effects than medicines taken by mouth. Do certain therapies as directed Some therapies can help with pain management. They include: Physical therapy. You will do exercises to gain strength and flexibility. A physical therapist may teach you exercises to move and stretch parts of your body that are weak, stiff, or painful. You can learn these exercises at physical therapy visits and practice them at home. Physical therapy may also involve: Massage. Heat wraps or applying heat or cold to affected areas. Electrical signals that interrupt pain signals (transcutaneous electrical nerve stimulation, TENS). Weak lasers that reduce pain and swelling (low-level laser therapy). Signals from your body that help you learn to regulate pain (biofeedback). Occupational therapy. This helps you to learn ways to function at home and work with less pain. Recreational therapy. This involves trying new activities or hobbies, such as a physical activity or drawing. Mental health therapy, including: Cognitive behavioral therapy (CBT). This helps you learn coping skills for dealing with pain. Acceptance and commitment therapy (ACT) to change the way you think and react to pain. Relaxation therapies, including muscle relaxation exercises and mindfulness-based stress reduction. Pain management counseling. This may be individual, family, or group counseling.  Receive medical treatments Medical treatments for pain management include: Nerve block injections. These may include a pain blocker and anti-inflammatory medicines. You may have injections: Near the spine to relieve chronic back or neck pain. Into joints to relieve back or joint pain. Into nerve areas  that supply a painful area to relieve body pain. Into muscles (trigger point injections) to relieve some painful muscle conditions. A medical device placed near your spine to help block pain signals and relieve nerve pain or chronic back pain (spinal cord stimulation device). Acupuncture. Follow these instructions at home Medicines Take over-the-counter and prescription medicines only as told by your health care provider. If you are taking pain medicine, ask your health care providers about possible side effects to watch out for. Do not drive or use heavy machinery while taking prescription opioid pain medicine. Lifestyle  Do not use drugs or alcohol to reduce pain. If you drink alcohol, limit how much you have to: 0-1 drink a day for women who are not pregnant. 0-2 drinks a day for men. Know how much alcohol is in a drink. In the [place] one drink equals one 12 oz bottle of beer (355 mL), one 5 oz glass of wine (148 mL), or one 1 oz glass of hard liquor (44 mL). Do not use any products that contain nicotine or tobacco. These products include cigarettes, chewing tobacco, and vaping devices, such as e-cigarettes. If you need help quitting, ask your health care provider. Eat a healthy diet and maintain a healthy weight. Poor diet and excess weight may make pain worse. Eat foods that are high in fiber. These include fresh fruits and vegetables, whole grains, and beans. Limit foods that are high in fat and processed sugars, such as fried and sweet foods. Exercise regularly. Exercise lowers stress and may help relieve pain. Ask your health care provider what activities and  exercises are safe for you. If your health care provider approves, join an exercise class that combines movement and stress reduction. Examples include yoga and tai chi. Get enough sleep. Lack of sleep may make pain worse. Lower stress as much as possible. Practice stress reduction techniques as told by your therapist. General  instructions Work with all your pain management providers to find the treatments that work best for you. You are an important member of your pain management team. There are many things you can do to reduce pain on your own. Consider joining an online or in-person support group for people who have chronic pain. Keep all follow-up visits. This is important. Where to find more information You can find more information about managing pain without opioids from: American Academy of Pain Medicine: painmed.org Institute for Chronic Pain: instituteforchronicpain.org American Chronic Pain Association: theacpa.org Contact a health care provider if: You have side effects from pain medicine. Your pain gets worse or does not get better with treatments or home therapy. You are struggling with anxiety or depression. Summary Many types of pain can be managed without opioids. Chronic pain may respond better to pain management without opioids. Pain is best managed when you and a team of health care providers work together. Pain management without opioids may include non-opioid medicines, medical treatments, physical therapy, mental health therapy, and lifestyle changes. Tell your health care providers if your pain gets worse or is not being managed well enough. This information is not intended to replace advice given to you by your health care provider. Make sure you discuss any questions you have with your health care provider. Document Revised: 11/12/2020 Document Reviewed: 11/12/2020 Elsevier Patient Education  2024 [employer].

## 2024-06-15 NOTE — Telephone Encounter (Signed)
 Called and spoke with patient regarding medication refill request. Patient noted during medicare wellness visit that she is running low on her Lorazepam . I informed her I would get a note back to Dr.Plotnikov to review on Monday.  Patient does have enough to get through the weekend

## 2024-06-15 NOTE — Progress Notes (Signed)
 Subjective:  Please attest and cosign this visit due to patients primary care provider not being in the office at the time the visit was completed.  (Pt of Dr A. Plotnikov)   Karina Newman is a 68 y.o. who presents for a Medicare Wellness preventive visit.  As a reminder, Annual Wellness Visits don't include a physical exam, and some assessments may be limited, especially if this visit is performed virtually. We may recommend an in-person follow-up visit with your provider if needed.  Visit Complete: Virtual I connected with  Alan Presume on 06/15/24 by a audio enabled telemedicine application and verified that I am speaking with the correct person using two identifiers.  Patient Location: Home  Provider Location: Office/Clinic  I discussed the limitations of evaluation and management by telemedicine. The patient expressed understanding and agreed to proceed.  Vital Signs: Because this visit was a virtual/telehealth visit, some criteria may be missing or patient reported. Any vitals not documented were not able to be obtained and vitals that have been documented are patient reported.  VideoDeclined- This patient declined Librarian, academic. Therefore the visit was completed with audio only.  Persons Participating in Visit: Patient.  AWV Questionnaire: No: Patient Medicare AWV questionnaire was not completed prior to this visit.  Cardiac Risk Factors include: advanced age (>62men, >16 women);hypertension     Objective:    Today's Vitals   06/15/24 1055  Weight: 169 lb (76.7 kg)  Height: 5' 4 (1.626 m)   Body mass index is 29.01 kg/m.     06/15/2024   10:55 AM 06/14/2023    1:14 PM 06/07/2022    2:16 PM 03/11/2022    7:47 AM 03/31/2015    4:24 PM  Advanced Directives  Does Patient Have a Medical Advance Directive? No No No No No   Would patient like information on creating a medical advance directive? No - Patient declined  No - Patient  declined No - Guardian declined      Data saved with a previous flowsheet row definition    Current Medications (verified) Outpatient Encounter Medications as of 06/15/2024  Medication Sig   amLODipine  (NORVASC ) 5 MG tablet Take 1 tablet (5 mg total) by mouth daily.   Bromfenac Sodium 0.07 % SOLN SMARTSIG:1 Drop(s) In Eye(s) Every 12 Hours   carvedilol  (COREG ) 12.5 MG tablet TAKE 1 TABLET BY MOUTH TWICE A DAY   Cholecalciferol 50 MCG (2000 UT) CAPS Take by mouth.   Cyanocobalamin  (VITAMIN B-12) 500 MCG SUBL 1 sl qd   cyclobenzaprine  (FLEXERIL ) 5 MG tablet Take 1 tablet (5 mg total) by mouth 3 (three) times daily as needed for muscle spasms.   Diclofenac  Sodium 1.5 % SOLN USE 15 DROPS THREE TIMES DAILY EXTERNALLY FOR PAIN   dicyclomine  (BENTYL ) 10 MG capsule TAKE 1 CAPSULE (10 MG TOTAL) BY MOUTH EVERY 8 (EIGHT) HOURS AS NEEDED FOR SPASMS.   diphenhydrAMINE (BENADRYL) 25 MG tablet Take 25 mg by mouth every 6 (six) hours as needed for allergies.   doxazosin  (CARDURA ) 4 MG tablet TAKE 1 TABLET BY MOUTH EVERY DAY   doxycycline  (VIBRA -TABS) 100 MG tablet Take 1 tablet (100 mg total) by mouth 2 (two) times daily. (Patient taking differently: Take 100 mg by mouth 2 (two) times daily. As needed)   gabapentin  (NEURONTIN ) 100 MG capsule TAKE 1 CAPSULE BY MOUTH THREE TIMES A DAY AS NEEDED   ipratropium (ATROVENT ) 0.06 % nasal spray Place 1 spray into both nostrils 3 (three) times daily  as needed for rhinitis.   levocetirizine (XYZAL ) 5 MG tablet Take 1 tablet (5 mg total) by mouth every evening.   LORazepam  (ATIVAN ) 0.5 MG tablet TAKE 1 TABLET (0.5 MG TOTAL) BY MOUTH 2 (TWO) TIMES DAILY AS NEEDED FOR ANXIETY OR SLEEP.   omeprazole  (PRILOSEC ) 40 MG capsule Take 1 capsule (40 mg total) by mouth 2 (two) times daily.   oxyCODONE  (ROXICODONE ) 5 MG immediate release tablet Take 1 tablet (5 mg total) by mouth every 8 (eight) hours as needed for severe pain (pain score 7-10).   Potassium Chloride  ER 20 MEQ TBCR  Take 1 tablet (20 mEq total) by mouth daily.   sucralfate  (CARAFATE ) 1 g tablet TAKE 1 TABLET BY MOUTH 3 TIMES A DAY BETWEEN MEALS   telmisartan -hydrochlorothiazide (MICARDIS  HCT) 80-25 MG tablet Take 1 tablet by mouth daily.   traZODone  (DESYREL ) 50 MG tablet TAKE 1 TABLET BY MOUTH EVERYDAY AT BEDTIME   valACYclovir  (VALTREX ) 500 MG tablet TAKE 1 TABLET BY MOUTH TWICE A DAY (Patient taking differently: Take 500 mg by mouth 2 (two) times daily. As needed)   Vitamin D , Cholecalciferol, 1000 units CAPS Take 2,000 Units by mouth daily.   cycloSPORINE (RESTASIS) 0.05 % ophthalmic emulsion Place 1 drop into both eyes 2 (two) times daily. (Patient not taking: Reported on 06/15/2024)   DULoxetine  (CYMBALTA ) 20 MG capsule TAKE 1 CAPSULE BY MOUTH EVERY DAY (Patient not taking: Reported on 06/15/2024)   pseudoephedrine  (SUDAFED) 120 MG 12 hr tablet Take by mouth. (Patient not taking: Reported on 06/15/2024)   triamcinolone  (NASACORT ) 55 MCG/ACT AERO nasal inhaler Place 2 sprays into the nose daily. (Patient not taking: Reported on 06/15/2024)   No facility-administered encounter medications on file as of 06/15/2024.    Allergies (verified) Penicillins, Tramadol , Iodine, Lyrica  [pregabalin ], Voltaren  [diclofenac ], Lexapro  [escitalopram ], and Nsaids   History: Past Medical History:  Diagnosis Date   Abnormal cervical Pap smear with positive HPV DNA test 01/2014,01/2017   2015 Normal cytology with positive high-risk HPV 18/45. Colposcopy negative with negative ECC.  2018 normal cytology with positive high-risk HPV 18/45   Anemia    Apnea 05/17/2018   Autoimmune gastritis    Cataract    Fatty liver    Gastritis    Heart murmur    Hypertension    Hyperthyroidism    Inappropriate sinus tachycardia 05/17/2018   Internal hemorrhoids    Osteoarthritis    Right shoulder pain 2001   chronic pain   Thyroid  disease    Hyperthyroid. Was on PTU (Dr Viviana in [place]) - stopped in 2013, stable   Tubular adenoma  of colon    Past Surgical History:  Procedure Laterality Date   BIOPSY  03/11/2022   Procedure: BIOPSY;  Surgeon: Wilhelmenia Aloha Raddle., MD;  Location: [facility] ENDOSCOPY;  Service: Gastroenterology;;   BREAST CYST ASPIRATION Left 2012   CATARACT EXTRACTION Right 06/15/2022   CATARACT EXTRACTION Left 06/28/2022   CESAREAN SECTION     x 4    COLONOSCOPY     ESOPHAGOGASTRODUODENOSCOPY (EGD) WITH PROPOFOL  N/A 03/11/2022   Procedure: ESOPHAGOGASTRODUODENOSCOPY (EGD) WITH PROPOFOL ;  Surgeon: Wilhelmenia Aloha Raddle., MD;  Location: [facility] ENDOSCOPY;  Service: Gastroenterology;  Laterality: N/A;   EUS N/A 03/11/2022   Procedure: UPPER ENDOSCOPIC ULTRASOUND (EUS) RADIAL;  Surgeon: Wilhelmenia Aloha Raddle., MD;  Location: [facility] ENDOSCOPY;  Service: Gastroenterology;  Laterality: N/A;   HAND SURGERY Left    SHOULDER SURGERY Right 2002   SHOULDER SURGERY Left 2014   TUBAL LIGATION  UPPER GASTROINTESTINAL ENDOSCOPY     Vitrectomy surgery Right 03/21/2023   Also cyst removal/ Lt eye done 04/21/2023   Family History  Problem Relation Age of Onset   Kidney disease Mother        ESRD   Hypertension Mother    Lung cancer Father    Hypertension Sister    Kidney disease Sister    Hypertension Sister    Diabetes Brother    Hypertension Brother    Hypertension Brother    Diabetes Paternal Grandmother    Sleep apnea Son    Hypertension Son    Colon cancer Neg Hx    Stomach cancer Neg Hx    Esophageal cancer Neg Hx    Rectal cancer Neg Hx    Pancreatic cancer Neg Hx    Social History   Socioeconomic History   Marital status: Married    Spouse name: Ashok   Number of children: 4   Years of education: Not on file   Highest education level: Not on file  Occupational History   Occupation: RETIRED  Tobacco Use   Smoking status: Never    Passive exposure: Never   Smokeless tobacco: Never  Vaping Use   Vaping status: Never Used  Substance and Sexual Activity   Alcohol use: Yes    Comment:  RARELY   Drug use: No   Sexual activity: Yes    Birth control/protection: Post-menopausal, Surgical    Comment: BTL-1st intercourse 16 yo-5 partners  Other Topics Concern   Not on file  Social History Narrative   Live with husbands   Social Drivers of Health   Financial Resource Strain: Low Risk  (06/15/2024)   Overall Financial Resource Strain (CARDIA)    Difficulty of Paying Living Expenses: Not hard at all  Food Insecurity: No Food Insecurity (06/15/2024)   Hunger Vital Sign    Worried About Running Out of Food in the Last Year: Never true    Ran Out of Food in the Last Year: Never true  Transportation Needs: No Transportation Needs (06/15/2024)   PRAPARE - Administrator, Civil Service (Medical): No    Lack of Transportation (Non-Medical): No  Physical Activity: Insufficiently Active (06/15/2024)   Exercise Vital Sign    Days of Exercise per Week: 3 days    Minutes of Exercise per Session: 30 min  Stress: No Stress Concern Present (06/15/2024)   [employer] of Occupational Health - Occupational Stress Questionnaire    Feeling of Stress: Not at all  Social Connections: Moderately Isolated (06/15/2024)   Social Connection and Isolation Panel    Frequency of Communication with Friends and Family: More than three times a week    Frequency of Social Gatherings with Friends and Family: Once a week    Attends Religious Services: Never    Database Administrator or Organizations: No    Attends Engineer, Structural: Never    Marital Status: Married    Tobacco Counseling Counseling given: Not Answered    Clinical Intake:  Pre-visit preparation completed: Yes  Pain : No/denies pain     BMI - recorded: 29.01 Nutritional Status: BMI 25 -29 Overweight Nutritional Risks: None Diabetes: No  Lab Results  Component Value Date   HGBA1C 6.2 11/21/2023   HGBA1C 6.3 06/15/2023     How often do you need to have someone help you when you read  instructions, pamphlets, or other written materials from your doctor or pharmacy?: 1 - Never  Interpreter Needed?: No  Information entered by :: Verdie Saba, CMA   Activities of Daily Living     06/15/2024   11:01 AM  In your present state of health, do you have any difficulty performing the following activities:  Hearing? 0  Vision? 0  Difficulty concentrating or making decisions? 0  Walking or climbing stairs? 0  Dressing or bathing? 0  Doing errands, shopping? 0  Preparing Food and eating ? N  Using the Toilet? N  In the past six months, have you accidently leaked urine? N  Do you have problems with loss of bowel control? N  Managing your Medications? N  Managing your Finances? N  Housekeeping or managing your Housekeeping? N    Patient Care Team: Plotnikov, Karlynn GAILS, MD as PCP - General (Internal Medicine) Raford Riggs, MD as PCP - Cardiology (Cardiology) Dolphus Reiter, MD as Consulting Physician (Rheumatology) Vernona Slough, OD as Consulting Physician (Ophthalmology) Jarold Mayo, MD as Consulting Physician (Ophthalmology)  I have updated your Care Teams any recent Medical Services you may have received from other providers in the past year.     Assessment:   This is a routine wellness examination for [place].  Hearing/Vision screen Hearing Screening - Comments:: Denies hearing difficulties   Vision Screening - Comments:: Wears rx glasses - up to date with routine eye exams with Slough Vernona and Mayo Jarold   Goals Addressed               This Visit's Progress     Patient Stated (pt-stated)        Patient stated she has been reducing sugar intake and watching her diet.  Plans to exercise more and enjoy life more.       Depression Screen     06/15/2024   11:02 AM 04/10/2024   10:17 AM 11/21/2023    3:27 PM 09/15/2023    1:41 PM 06/14/2023    1:21 PM 03/10/2023    1:40 PM 12/07/2022   11:17 AM  PHQ 2/9 Scores  PHQ - 2 Score 0 0 2  0 3 0 2  PHQ- 9 Score 0  8  6  4     Fall Risk     06/15/2024   11:01 AM 04/10/2024   10:17 AM 11/21/2023    3:27 PM 09/15/2023    1:41 PM 06/15/2023    1:20 PM  Fall Risk   Falls in the past year? 0 0 0 0 0  Number falls in past yr: 0 0 0 0 0  Injury with Fall? 0 0 0 0 0  Risk for fall due to : No Fall Risks No Fall Risks No Fall Risks No Fall Risks No Fall Risks  Follow up Falls evaluation completed;Falls prevention discussed Falls evaluation completed Falls evaluation completed Falls evaluation completed Falls evaluation completed    MEDICARE RISK AT HOME:  Medicare Risk at Home Any stairs in or around the home?: Yes If so, are there any without handrails?: No Home free of loose throw rugs in walkways, pet beds, electrical cords, etc?: Yes Adequate lighting in your home to reduce risk of falls?: Yes Life alert?: No Use of a cane, walker or w/c?: No Grab bars in the bathroom?: No Shower chair or bench in shower?: No Elevated toilet seat or a handicapped toilet?: No  TIMED UP AND GO:  Was the test performed?  No  Cognitive Function: 6CIT completed        06/15/2024   11:04  AM 06/14/2023    1:16 PM 06/07/2022    2:18 PM  6CIT Screen  What Year? 0 points 0 points 0 points  What month? 0 points 0 points 0 points  What time? 0 points 0 points 0 points  Count back from 20 0 points 0 points 0 points  Months in reverse 0 points 0 points 0 points  Repeat phrase 0 points 0 points 0 points  Total Score 0 points 0 points 0 points    Immunizations Immunization History  Administered Date(s) Administered   Tdap 10/17/2021   Zoster Recombinant(Shingrix ) 03/25/2017, 06/27/2017    Screening Tests Health Maintenance  Topic Date Due   Pneumococcal Vaccine: 50+ Years (1 of 1 - PCV) Never done   Medicare Annual Wellness (AWV)  06/15/2025   Mammogram  08/14/2025   Colonoscopy  01/18/2027   DTaP/Tdap/Td (2 - Td or Tdap) 10/18/2031   DEXA SCAN  Completed   Hepatitis C  Screening  Completed   Zoster Vaccines- Shingrix   Completed   Meningococcal B Vaccine  Aged Out   Influenza Vaccine  Discontinued   COVID-19 Vaccine  Discontinued    Health Maintenance Items Addressed:  I have recommended that this patient have a immunization for Influenza and Pneumonia but she declines at this time. I have discussed the risks and benefits of this procedure with her. The patient verbalizes understanding.   Additional Screening:  Vision Screening: Recommended annual ophthalmology exams for early detection of glaucoma and other disorders of the eye. Is the patient up to date with their annual eye exam?  Yes  Who is the provider or what is the name of the office in which the patient attends annual eye exams? [facility]  Dental Screening: Recommended annual dental exams for proper oral hygiene  Community Resource Referral / Chronic Care Management: CRR required this visit?  No   CCM required this visit?  No   Plan:    I have personally reviewed and noted the following in the patient's chart:   Medical and social history Use of alcohol, tobacco or illicit drugs  Current medications and supplements including opioid prescriptions. Patient is not currently taking opioid prescriptions. Functional ability and status Nutritional status Physical activity Advanced directives List of other physicians Hospitalizations, surgeries, and ER visits in previous 12 months Vitals Screenings to include cognitive, depression, and falls Referrals and appointments  In addition, I have reviewed and discussed with patient certain preventive protocols, quality metrics, and best practice recommendations. A written personalized care plan for preventive services as well as general preventive health recommendations were provided to patient.   Verdie CHRISTELLA Saba, CMA   06/15/2024   After Visit Summary: (MyChart) Due to this being a telephonic visit, the after visit  summary with patients personalized plan was offered to patient via MyChart   Notes: Nothing significant to report at this time.

## 2024-06-18 MED ORDER — LORAZEPAM 0.5 MG PO TABS: 0.5000 mg | ORAL_TABLET | Freq: Two times a day (BID) | ORAL | 3 refills | Status: DC | PRN

## 2024-06-18 NOTE — Addendum Note (Signed)
 Addended by: Nas Wafer V on: 06/18/2024 07:22 AM   Modules accepted: Orders

## 2024-06-18 NOTE — Telephone Encounter (Signed)
It was done ?Thx ?

## 2024-06-18 NOTE — Telephone Encounter (Signed)
 Done. Thanks.

## 2024-06-21 ENCOUNTER — Ambulatory Visit: Admitting: Obstetrics and Gynecology

## 2024-06-21 ENCOUNTER — Other Ambulatory Visit: Payer: Self-pay | Admitting: *Deleted

## 2024-06-21 ENCOUNTER — Telehealth: Payer: Self-pay

## 2024-06-21 ENCOUNTER — Encounter: Payer: Self-pay | Admitting: Obstetrics and Gynecology

## 2024-06-21 ENCOUNTER — Other Ambulatory Visit (HOSPITAL_COMMUNITY)
Admission: RE | Admit: 2024-06-21 | Discharge: 2024-06-21 | Disposition: A | Source: Ambulatory Visit | Attending: Obstetrics and Gynecology | Admitting: Obstetrics and Gynecology

## 2024-06-21 ENCOUNTER — Encounter: Payer: Self-pay | Admitting: *Deleted

## 2024-06-21 VITALS — BP 138/82 | HR 90 | Ht 62.8 in | Wt 169.6 lb

## 2024-06-21 DIAGNOSIS — Z01419 Encounter for gynecological examination (general) (routine) without abnormal findings: Secondary | ICD-10-CM | POA: Insufficient documentation

## 2024-06-21 DIAGNOSIS — M810 Age-related osteoporosis without current pathological fracture: Secondary | ICD-10-CM

## 2024-06-21 DIAGNOSIS — Z1231 Encounter for screening mammogram for malignant neoplasm of breast: Secondary | ICD-10-CM

## 2024-06-21 DIAGNOSIS — Z124 Encounter for screening for malignant neoplasm of cervix: Secondary | ICD-10-CM | POA: Diagnosis not present

## 2024-06-21 DIAGNOSIS — Z9189 Other specified personal risk factors, not elsewhere classified: Secondary | ICD-10-CM

## 2024-06-21 DIAGNOSIS — B009 Herpesviral infection, unspecified: Secondary | ICD-10-CM

## 2024-06-21 DIAGNOSIS — R87810 Cervical high risk human papillomavirus (HPV) DNA test positive: Secondary | ICD-10-CM | POA: Diagnosis not present

## 2024-06-21 DIAGNOSIS — N952 Postmenopausal atrophic vaginitis: Secondary | ICD-10-CM | POA: Diagnosis not present

## 2024-06-21 MED ORDER — DENOSUMAB 60 MG/ML ~~LOC~~ SOSY
60.0000 mg | PREFILLED_SYRINGE | Freq: Once | SUBCUTANEOUS | 0 refills | Status: DC
  Filled 2024-06-25: qty 1, 1d supply, fill #0

## 2024-06-21 MED ORDER — DENOSUMAB 60 MG/ML ~~LOC~~ SOSY: 60.0000 mg | PREFILLED_SYRINGE | Freq: Once | SUBCUTANEOUS | Status: AC

## 2024-06-21 MED ORDER — INTRAROSA 6.5 MG VA INST: 1.0000 | VAGINAL_INSERT | Freq: Every evening | VAGINAL | 12 refills | Status: AC | PRN

## 2024-06-21 MED ORDER — INTRAROSA 6.5 MG VA INST: 1.0000 | VAGINAL_INSERT | Freq: Every evening | VAGINAL | 12 refills | Status: DC | PRN

## 2024-06-21 NOTE — Telephone Encounter (Signed)
 The intrarosa is requiring a PA, I have submitted it to covermymeds.   Key: [id number] DX: [id number]  Pt is aware of this.

## 2024-06-21 NOTE — Progress Notes (Signed)
 68 y.o. y.o. female here for annual medicare gyn exam. No LMP recorded. Patient is postmenopausal.     Patient has bleeding with sex and vaginal dryness.  To try intrarosa  She has had burning and bleeding with intercourse for years. She may be tearing. She will spot for several days after intercourse. She will have some spotting if she strains to have a BM.    H/O HSV, outbreak a couple of times a year.     No LMP recorded. Patient is postmenopausal.          Sexually active: Yes.    The current method of family planning is post menopausal status.    Exercising: No.  The patient does not participate in regular exercise at present. Smoker:  no  Health Maintenance: Pap:  09/26/19 neg Hr HPV +;  01/21/17  Neg + HPV 18/45 History of abnormal Pap:  yes MMG:  04/07/21 Bi-rads 1 neg  BMD: 02/06/24  -1.9 no fractures but unstable gait and fell on her right hip this year. Consider prolia with current risks and ongoing risks. Brochure and counseling provided. Colonoscopy: 05/06/16 f/u 5 years, 01/18/24 TDaP:  unsure  Gardasil: n/a Body mass index is 30.24 kg/m.     Blood pressure 138/82, pulse 90, height 5' 2.8 (1.595 m), weight 169 lb 9.6 oz (76.9 kg), SpO2 98%.     Component Value Date/Time   DIAGPAP  04/10/2021 1359    - Negative for intraepithelial lesion or malignancy (NILM)   HPVHIGH Positive (A) 04/10/2021 1359   ADEQPAP  04/10/2021 1359    Satisfactory for evaluation; transformation zone component PRESENT.    GYN HISTORY:    Component Value Date/Time   DIAGPAP  04/10/2021 1359    - Negative for intraepithelial lesion or malignancy (NILM)   HPVHIGH Positive (A) 04/10/2021 1359   ADEQPAP  04/10/2021 1359    Satisfactory for evaluation; transformation zone component PRESENT.    OB History  Gravida Para Term Preterm AB Living  5 4 4  1 3   SAB IAB Ectopic Multiple Live Births  1    4    # Outcome Date GA Lbr Len/2nd Weight Sex Type Anes PTL Lv  5 SAB           4  Term           3 Term           2 Term           1 Term             Past Medical History:  Diagnosis Date   Abnormal cervical Pap smear with positive HPV DNA test 01/2014,01/2017   2015 Normal cytology with positive high-risk HPV 18/45. Colposcopy negative with negative ECC.  2018 normal cytology with positive high-risk HPV 18/45   Anemia    Apnea 05/17/2018   Autoimmune gastritis    Cataract    Fall 2025   fell on hip   Fatty liver    Gastritis    Heart murmur    Hypertension    Hyperthyroidism    Inappropriate sinus tachycardia 05/17/2018   Internal hemorrhoids    Osteoarthritis    Osteopenia    2025 -1.9 with fall risks and ongoing risk factors. To try for prolia   Right shoulder pain 2001   chronic pain   Thyroid  disease    Hyperthyroid. Was on PTU (Dr Viviana in [place]) - stopped in 2013, stable  Tubular adenoma of colon     Past Surgical History:  Procedure Laterality Date   BIOPSY  03/11/2022   Procedure: BIOPSY;  Surgeon: Wilhelmenia Aloha Raddle., MD;  Location: [facility] ENDOSCOPY;  Service: Gastroenterology;;   BREAST CYST ASPIRATION Left 2012   CATARACT EXTRACTION Right 06/15/2022   CATARACT EXTRACTION Left 06/28/2022   CESAREAN SECTION     x 4    COLONOSCOPY     ESOPHAGOGASTRODUODENOSCOPY (EGD) WITH PROPOFOL  N/A 03/11/2022   Procedure: ESOPHAGOGASTRODUODENOSCOPY (EGD) WITH PROPOFOL ;  Surgeon: Wilhelmenia Aloha Raddle., MD;  Location: [facility] ENDOSCOPY;  Service: Gastroenterology;  Laterality: N/A;   EUS N/A 03/11/2022   Procedure: UPPER ENDOSCOPIC ULTRASOUND (EUS) RADIAL;  Surgeon: Wilhelmenia Aloha Raddle., MD;  Location: [facility] ENDOSCOPY;  Service: Gastroenterology;  Laterality: N/A;   HAND SURGERY Left    SHOULDER SURGERY Right 2002   SHOULDER SURGERY Left 2014   TUBAL LIGATION     UPPER GASTROINTESTINAL ENDOSCOPY     Vitrectomy surgery Right 03/21/2023   Also cyst removal/ Lt eye done 04/21/2023    Current Outpatient Medications on File Prior to Visit  Medication Sig  Dispense Refill   amLODipine  (NORVASC ) 5 MG tablet Take 1 tablet (5 mg total) by mouth daily. 90 tablet 3   Bromfenac Sodium 0.07 % SOLN SMARTSIG:1 Drop(s) In Eye(s) Every 12 Hours     carvedilol  (COREG ) 12.5 MG tablet TAKE 1 TABLET BY MOUTH TWICE A DAY 180 tablet 3   Cholecalciferol 50 MCG (2000 UT) CAPS Take by mouth.     Cyanocobalamin  (VITAMIN B-12) 500 MCG SUBL 1 sl qd 100 tablet 5   cyclobenzaprine  (FLEXERIL ) 5 MG tablet Take 1 tablet (5 mg total) by mouth 3 (three) times daily as needed for muscle spasms. 60 tablet 2   Diclofenac  Sodium 1.5 % SOLN USE 15 DROPS THREE TIMES DAILY EXTERNALLY FOR PAIN 150 mL 5   dicyclomine  (BENTYL ) 10 MG capsule TAKE 1 CAPSULE (10 MG TOTAL) BY MOUTH EVERY 8 (EIGHT) HOURS AS NEEDED FOR SPASMS. 270 capsule 3   diphenhydrAMINE (BENADRYL) 25 MG tablet Take 25 mg by mouth every 6 (six) hours as needed for allergies.     doxazosin  (CARDURA ) 4 MG tablet TAKE 1 TABLET BY MOUTH EVERY DAY 90 tablet 1   doxycycline  (VIBRA -TABS) 100 MG tablet Take 1 tablet (100 mg total) by mouth 2 (two) times daily. (Patient taking differently: Take 100 mg by mouth as needed. As needed) 20 tablet 0   DULoxetine  (CYMBALTA ) 20 MG capsule TAKE 1 CAPSULE BY MOUTH EVERY DAY (Patient taking differently: Take by mouth daily. As needed) 90 capsule 1   gabapentin  (NEURONTIN ) 100 MG capsule TAKE 1 CAPSULE BY MOUTH THREE TIMES A DAY AS NEEDED 90 capsule 3   ipratropium (ATROVENT ) 0.06 % nasal spray Place 1 spray into both nostrils 3 (three) times daily as needed for rhinitis. 15 mL 5   levocetirizine (XYZAL ) 5 MG tablet Take 1 tablet (5 mg total) by mouth every evening. 30 tablet 5   LORazepam  (ATIVAN ) 0.5 MG tablet Take 1 tablet (0.5 mg total) by mouth 2 (two) times daily as needed for anxiety or sleep. 60 tablet 3   omeprazole  (PRILOSEC ) 40 MG capsule Take 1 capsule (40 mg total) by mouth 2 (two) times daily. 180 capsule 3   oxyCODONE  (ROXICODONE ) 5 MG immediate release tablet Take 1 tablet (5 mg  total) by mouth every 8 (eight) hours as needed for severe pain (pain score 7-10). 90 tablet 0   Potassium Chloride  ER  20 MEQ TBCR Take 1 tablet (20 mEq total) by mouth daily. 90 tablet 3   pseudoephedrine  (SUDAFED) 120 MG 12 hr tablet Take by mouth. (Patient taking differently: Take by mouth as needed.)     sucralfate  (CARAFATE ) 1 g tablet TAKE 1 TABLET BY MOUTH 3 TIMES A DAY BETWEEN MEALS 270 tablet 3   telmisartan -hydrochlorothiazide (MICARDIS  HCT) 80-25 MG tablet Take 1 tablet by mouth daily. 90 tablet 3   traZODone  (DESYREL ) 50 MG tablet TAKE 1 TABLET BY MOUTH EVERYDAY AT BEDTIME 90 tablet 3   valACYclovir  (VALTREX ) 500 MG tablet TAKE 1 TABLET BY MOUTH TWICE A DAY (Patient taking differently: Take 500 mg by mouth as needed. As needed) 60 tablet 1   Vitamin D , Cholecalciferol, 1000 units CAPS Take 2,000 Units by mouth daily.     cycloSPORINE (RESTASIS) 0.05 % ophthalmic emulsion Place 1 drop into both eyes 2 (two) times daily. (Patient not taking: Reported on 06/21/2024)     triamcinolone  (NASACORT ) 55 MCG/ACT AERO nasal inhaler Place 2 sprays into the nose daily. (Patient not taking: Reported on 06/21/2024) 1 each 5   No current facility-administered medications on file prior to visit.    Social History   Socioeconomic History   Marital status: Married    Spouse name: Ashok   Number of children: 4   Years of education: Not on file   Highest education level: Not on file  Occupational History   Occupation: RETIRED  Tobacco Use   Smoking status: Never    Passive exposure: Never   Smokeless tobacco: Never  Vaping Use   Vaping status: Never Used  Substance and Sexual Activity   Alcohol use: Not Currently   Drug use: No   Sexual activity: Yes    Birth control/protection: Post-menopausal, Surgical    Comment: BTL-1st intercourse 16 yo-5 partners  Other Topics Concern   Not on file  Social History Narrative   Live with husbands   Social Drivers of Health   Financial Resource  Strain: Low Risk  (06/15/2024)   Overall Financial Resource Strain (CARDIA)    Difficulty of Paying Living Expenses: Not hard at all  Food Insecurity: No Food Insecurity (06/15/2024)   Hunger Vital Sign    Worried About Running Out of Food in the Last Year: Never true    Ran Out of Food in the Last Year: Never true  Transportation Needs: No Transportation Needs (06/15/2024)   PRAPARE - Administrator, Civil Service (Medical): No    Lack of Transportation (Non-Medical): No  Physical Activity: Insufficiently Active (06/15/2024)   Exercise Vital Sign    Days of Exercise per Week: 3 days    Minutes of Exercise per Session: 30 min  Stress: No Stress Concern Present (06/15/2024)   [employer] of Occupational Health - Occupational Stress Questionnaire    Feeling of Stress: Not at all  Social Connections: Moderately Isolated (06/15/2024)   Social Connection and Isolation Panel    Frequency of Communication with Friends and Family: More than three times a week    Frequency of Social Gatherings with Friends and Family: Once a week    Attends Religious Services: Never    Database Administrator or Organizations: No    Attends Banker Meetings: Never    Marital Status: Married  Catering Manager Violence: Not At Risk (06/15/2024)   Humiliation, Afraid, Rape, and Kick questionnaire    Fear of Current or Ex-Partner: No    Emotionally Abused: No  Physically Abused: No    Sexually Abused: No    Family History  Problem Relation Age of Onset   Kidney disease Mother        ESRD   Hypertension Mother    Lung cancer Father    Hypertension Sister    Kidney disease Sister    Hypertension Sister    Diabetes Brother    Hypertension Brother    Hypertension Brother    Diabetes Paternal Grandmother    Sleep apnea Son    Hypertension Son    Colon cancer Neg Hx    Stomach cancer Neg Hx    Esophageal cancer Neg Hx    Rectal cancer Neg Hx    Pancreatic cancer Neg Hx       Allergies  Allergen Reactions   Penicillins Hives   Tramadol  Anaphylaxis    Memory lapses    Iodine Other (See Comments)    Due to hyperthyroidism   Lyrica  [Pregabalin ] Dermatitis   Voltaren  [Diclofenac ] Hives   Lexapro  [Escitalopram ] Other (See Comments)    fatigue   Nsaids Other (See Comments)    gastritis      Patient's last menstrual period was No LMP recorded. Patient is postmenopausal..            Review of Systems Alls systems reviewed and are negative.     Physical Exam Constitutional:      Appearance: Normal appearance.  Genitourinary:     Vulva and urethral meatus normal.     No lesions in the vagina.     Right Labia: No rash, lesions or skin changes.    Left Labia: No lesions, skin changes or rash.    No vaginal discharge or tenderness.     No vaginal prolapse present.    Moderate vaginal atrophy present.     Right Adnexa: not tender, not palpable and no mass present.    Left Adnexa: not tender, not palpable and no mass present.    No cervical motion tenderness or discharge.     Uterus is not enlarged, tender or irregular.  Breasts:    Right: Normal.     Left: Normal.  HENT:     Head: Normocephalic.  Neck:     Thyroid : No thyroid  mass, thyromegaly or thyroid  tenderness.  Cardiovascular:     Rate and Rhythm: Normal rate and regular rhythm.     Heart sounds: Normal heart sounds, S1 normal and S2 normal.  Pulmonary:     Effort: Pulmonary effort is normal.     Breath sounds: Normal breath sounds and air entry.  Abdominal:     General: There is no distension.     Palpations: Abdomen is soft. There is no mass.     Tenderness: There is no abdominal tenderness. There is no guarding or rebound.  Musculoskeletal:        General: Normal range of motion.     Cervical back: Full passive range of motion without pain, normal range of motion and neck supple. No tenderness.     Right lower leg: No edema.     Left lower leg: No edema.  Neurological:      Mental Status: She is alert.  Skin:    General: Skin is warm.  Psychiatric:        Mood and Affect: Mood normal.        Behavior: Behavior normal.        Thought Content: Thought content normal.  Vitals and nursing note reviewed. Exam  conducted with a chaperone present.       A:         Well Woman medicare GYN exam             Dyspareunia, vaginal dryness                P:        Pap smear collected today Encouraged annual mammogram screening Colon cancer screening up-to-date DXA up-to-date Labs and immunizations to do with PMD Discussed breast self exams Encouraged healthy lifestyle practices Encouraged Vit D and Calcium   No follow-ups on file.  Karina Newman

## 2024-06-22 ENCOUNTER — Telehealth: Payer: Self-pay

## 2024-06-22 NOTE — Telephone Encounter (Signed)
 Prolia  VOB initiated via [email or web address]  Next Prolia  inj DUE: NEW START

## 2024-06-25 ENCOUNTER — Other Ambulatory Visit: Payer: Self-pay

## 2024-06-25 ENCOUNTER — Encounter (HOSPITAL_COMMUNITY): Payer: Self-pay

## 2024-06-25 ENCOUNTER — Other Ambulatory Visit (HOSPITAL_COMMUNITY): Payer: Self-pay

## 2024-06-25 NOTE — Progress Notes (Signed)
 Patient enrolled in error. Office will do buy and bill. Dis-enrolling.

## 2024-06-25 NOTE — Telephone Encounter (Signed)
 Buy/Bill (Office supplied medication)  Out-of-pocket cost due at time of clinic visit: $352  Number of injection/visits approved: 2  Primary: [employer]-MEDICARE Co-insurance: 20% Admin fee co-insurance: $20  Secondary: --- Co-insurance:  Admin fee co-insurance:   Medical Benefit Details: Date Benefits were checked: 06/22/24 Deductible: NO/ Coinsurance: 20%/ Admin Fee: $20  Prior Auth: APPROVED PA#  [id number] Expiration Date: 06/25/24-06/25/25  # of doses approved: 2 -----------------------------------------------------------------------  Patient NOT eligible for Copay Card. Copay Card can make patient's cost as little as $25. Link to apply: [email or web address]://[email or web address]  ** This summary of benefits is an estimation of the patient's out-of-pocket cost. Exact cost may very based on individual plan coverage.

## 2024-06-25 NOTE — Telephone Encounter (Signed)
 Karina Newman

## 2024-06-25 NOTE — Telephone Encounter (Signed)
 Decision Notes: Karina Newman is denied because it is not on your plan's Drug List (formulary). Medication authorization requires the following: (1) You need to try three (3) of these covered drugs: (a) Estradiol vaginal tablet, Estring, Femring, Imvexxy Maintenance Pack, Imvexxy Starter Pack, or Yuvafem*. (b) Osphena*. (c) Premarin vaginal cream. (2) OR your doctor needs to give us  specific medical reasons why three (3) of the covered drug(s) are not appropriate for you.   Please advise on how to move forward

## 2024-06-25 NOTE — Telephone Encounter (Signed)
 Medication will be filled under Medical Benefit. MTDM appointment request cancelled.

## 2024-06-27 ENCOUNTER — Ambulatory Visit: Payer: Self-pay | Admitting: Obstetrics and Gynecology

## 2024-06-27 LAB — CYTOLOGY - PAP: Diagnosis: NEGATIVE

## 2024-06-28 NOTE — Telephone Encounter (Signed)
 Pt states that she is going to wait and talk with her PCP then she will let us  know what she would like to do.

## 2024-07-04 NOTE — Telephone Encounter (Signed)
 See referral

## 2024-07-05 ENCOUNTER — Other Ambulatory Visit: Payer: Self-pay

## 2024-07-09 ENCOUNTER — Encounter: Payer: Self-pay | Admitting: Internal Medicine

## 2024-07-09 ENCOUNTER — Ambulatory Visit: Admitting: Internal Medicine

## 2024-07-09 VITALS — BP 122/70 | HR 84 | Temp 97.8°F | Ht 62.8 in | Wt 170.0 lb

## 2024-07-09 DIAGNOSIS — K760 Fatty (change of) liver, not elsewhere classified: Secondary | ICD-10-CM | POA: Diagnosis not present

## 2024-07-09 DIAGNOSIS — M5010 Cervical disc disorder with radiculopathy, unspecified cervical region: Secondary | ICD-10-CM

## 2024-07-09 DIAGNOSIS — E559 Vitamin D deficiency, unspecified: Secondary | ICD-10-CM

## 2024-07-09 DIAGNOSIS — F4321 Adjustment disorder with depressed mood: Secondary | ICD-10-CM

## 2024-07-09 DIAGNOSIS — I10 Essential (primary) hypertension: Secondary | ICD-10-CM

## 2024-07-09 LAB — URINALYSIS
Bilirubin Urine: NEGATIVE
Ketones, ur: NEGATIVE
Leukocytes,Ua: NEGATIVE
Nitrite: NEGATIVE
Specific Gravity, Urine: 1.015 (ref 1.000–1.030)
Total Protein, Urine: NEGATIVE
Urine Glucose: NEGATIVE
Urobilinogen, UA: 0.2 (ref 0.0–1.0)
pH: 6 (ref 5.0–8.0)

## 2024-07-09 LAB — COMPREHENSIVE METABOLIC PANEL WITH GFR
ALT: 26 U/L (ref 0–35)
AST: 17 U/L (ref 0–37)
Albumin: 4.2 g/dL (ref 3.5–5.2)
Alkaline Phosphatase: 82 U/L (ref 39–117)
BUN: 11 mg/dL (ref 6–23)
CO2: 34 meq/L — ABNORMAL HIGH (ref 19–32)
Calcium: 9.7 mg/dL (ref 8.4–10.5)
Chloride: 99 meq/L (ref 96–112)
Creatinine, Ser: 0.8 mg/dL (ref 0.40–1.20)
GFR: 76.25 mL/min (ref >60.00)
Glucose, Bld: 106 mg/dL — ABNORMAL HIGH (ref 70–99)
Potassium: 3.7 meq/L (ref 3.5–5.1)
Sodium: 139 meq/L (ref 135–145)
Total Bilirubin: 0.5 mg/dL (ref 0.2–1.2)
Total Protein: 7.7 g/dL (ref 6.0–8.3)

## 2024-07-09 LAB — CBC WITH DIFFERENTIAL/PLATELET
Basophils Absolute: 0 K/uL (ref 0.0–0.1)
Basophils Relative: 0.4 % (ref 0.0–3.0)
Eosinophils Absolute: 0 K/uL (ref 0.0–0.7)
Eosinophils Relative: 0.7 % (ref 0.0–5.0)
HCT: 39.6 % (ref 36.0–46.0)
Hemoglobin: 13.5 g/dL (ref 12.0–15.0)
Lymphocytes Relative: 23.1 % (ref 12.0–46.0)
Lymphs Abs: 1.4 K/uL (ref 0.7–4.0)
MCHC: 34 g/dL (ref 30.0–36.0)
MCV: 95.2 fl (ref 78.0–100.0)
Monocytes Absolute: 0.4 K/uL (ref 0.1–1.0)
Monocytes Relative: 6 % (ref 3.0–12.0)
Neutro Abs: 4.4 K/uL (ref 1.4–7.7)
Neutrophils Relative %: 69.8 % (ref 43.0–77.0)
Platelets: 311 K/uL (ref 150.0–400.0)
RBC: 4.16 Mil/uL (ref 3.87–5.11)
RDW: 12.1 % (ref 11.5–15.5)
WBC: 6.3 K/uL (ref 4.0–10.5)

## 2024-07-09 LAB — TSH: TSH: 0.42 u[IU]/mL (ref 0.35–5.50)

## 2024-07-09 LAB — HEMOGLOBIN A1C: Hgb A1c MFr Bld: 6.3 % (ref 4.6–6.5)

## 2024-07-09 MED ORDER — OXYCODONE HCL 5 MG PO TABS: 5.0000 mg | ORAL_TABLET | Freq: Three times a day (TID) | ORAL | 0 refills | Status: AC | PRN

## 2024-07-09 MED ORDER — LORAZEPAM 0.5 MG PO TABS: 0.5000 mg | ORAL_TABLET | Freq: Two times a day (BID) | ORAL | 3 refills | Status: AC | PRN

## 2024-07-09 MED ORDER — GABAPENTIN 100 MG PO CAPS: ORAL_CAPSULE | ORAL | 3 refills | Status: AC

## 2024-07-09 NOTE — Assessment & Plan Note (Signed)
 On Vit D Oseopenia on DEXA 2025 - GYN offered Prolia  as I understand

## 2024-07-09 NOTE — Assessment & Plan Note (Signed)
  On Oxy 5 mg prn Chronic problem.  Reasonably well controlled on current therapy.  Potential benefits of a long term opioids use as well as potential risks (i.e. addiction risk, apnea etc) and complications (i.e. Somnolence, constipation and others) were explained to the patient and were aknowledged.

## 2024-07-09 NOTE — Assessment & Plan Note (Signed)
 Off Tylenol 

## 2024-07-09 NOTE — Assessment & Plan Note (Signed)
 Cont w/Amlodipine , Micardis  HCT Monitor BP at home - nl BP per pt NAS diet

## 2024-07-09 NOTE — Progress Notes (Signed)
 Subjective:  Patient ID: Karina Newman, female    DOB: 1955-08-24  Age: 68 y.o. MRN: [id number]  CC: Medical Management of Chronic Issues (3 Month follow up)   HPI Karina Newman presents for chronic pain, HTN, depression, grief  Outpatient Medications Prior to Visit  Medication Sig Dispense Refill   amLODipine  (NORVASC ) 5 MG tablet Take 1 tablet (5 mg total) by mouth daily. 90 tablet 3   Bromfenac Sodium 0.07 % SOLN SMARTSIG:1 Drop(s) In Eye(s) Every 12 Hours     carvedilol  (COREG ) 12.5 MG tablet TAKE 1 TABLET BY MOUTH TWICE A DAY 180 tablet 3   Cholecalciferol 50 MCG (2000 UT) CAPS Take by mouth.     Cyanocobalamin  (VITAMIN B-12) 500 MCG SUBL 1 sl qd 100 tablet 5   cyclobenzaprine  (FLEXERIL ) 5 MG tablet Take 1 tablet (5 mg total) by mouth 3 (three) times daily as needed for muscle spasms. 60 tablet 2   Diclofenac  Sodium 1.5 % SOLN USE 15 DROPS THREE TIMES DAILY EXTERNALLY FOR PAIN 150 mL 5   dicyclomine  (BENTYL ) 10 MG capsule TAKE 1 CAPSULE (10 MG TOTAL) BY MOUTH EVERY 8 (EIGHT) HOURS AS NEEDED FOR SPASMS. 270 capsule 3   diphenhydrAMINE (BENADRYL) 25 MG tablet Take 25 mg by mouth every 6 (six) hours as needed for allergies.     doxazosin  (CARDURA ) 4 MG tablet TAKE 1 TABLET BY MOUTH EVERY DAY 90 tablet 1   doxycycline  (VIBRA -TABS) 100 MG tablet Take 1 tablet (100 mg total) by mouth 2 (two) times daily. (Patient taking differently: Take 100 mg by mouth as needed. As needed) 20 tablet 0   DULoxetine  (CYMBALTA ) 20 MG capsule TAKE 1 CAPSULE BY MOUTH EVERY DAY (Patient taking differently: Take by mouth daily. As needed) 90 capsule 1   ipratropium (ATROVENT ) 0.06 % nasal spray Place 1 spray into both nostrils 3 (three) times daily as needed for rhinitis. 15 mL 5   levocetirizine (XYZAL ) 5 MG tablet Take 1 tablet (5 mg total) by mouth every evening. 30 tablet 5   omeprazole  (PRILOSEC ) 40 MG capsule Take 1 capsule (40 mg total) by mouth 2 (two) times daily. 180 capsule 3   Potassium Chloride  ER  20 MEQ TBCR Take 1 tablet (20 mEq total) by mouth daily. 90 tablet 3   Prasterone  (INTRAROSA ) 6.5 MG INST Place 1 suppository vaginally at bedtime as needed. 30 each 12   pseudoephedrine  (SUDAFED) 120 MG 12 hr tablet Take by mouth. (Patient taking differently: Take by mouth as needed.)     sucralfate  (CARAFATE ) 1 g tablet TAKE 1 TABLET BY MOUTH 3 TIMES A DAY BETWEEN MEALS 270 tablet 3   telmisartan -hydrochlorothiazide (MICARDIS  HCT) 80-25 MG tablet Take 1 tablet by mouth daily. 90 tablet 3   traZODone  (DESYREL ) 50 MG tablet TAKE 1 TABLET BY MOUTH EVERYDAY AT BEDTIME 90 tablet 3   valACYclovir  (VALTREX ) 500 MG tablet TAKE 1 TABLET BY MOUTH TWICE A DAY (Patient taking differently: Take 500 mg by mouth as needed. As needed) 60 tablet 1   Vitamin D , Cholecalciferol, 1000 units CAPS Take 2,000 Units by mouth daily.     gabapentin  (NEURONTIN ) 100 MG capsule TAKE 1 CAPSULE BY MOUTH THREE TIMES A DAY AS NEEDED 90 capsule 3   LORazepam  (ATIVAN ) 0.5 MG tablet Take 1 tablet (0.5 mg total) by mouth 2 (two) times daily as needed for anxiety or sleep. 60 tablet 3   oxyCODONE  (ROXICODONE ) 5 MG immediate release tablet Take 1 tablet (5 mg total) by mouth  every 8 (eight) hours as needed for severe pain (pain score 7-10). 90 tablet 0   cycloSPORINE (RESTASIS) 0.05 % ophthalmic emulsion Place 1 drop into both eyes 2 (two) times daily. (Patient not taking: Reported on 07/09/2024)     triamcinolone  (NASACORT ) 55 MCG/ACT AERO nasal inhaler Place 2 sprays into the nose daily. (Patient not taking: Reported on 07/09/2024) 1 each 5   Facility-Administered Medications Prior to Visit  Medication Dose Route Frequency Provider Last Rate Last Admin   denosumab  (PROLIA ) injection 60 mg  60 mg Subcutaneous Once Boswell, Elizabeth K, MD        ROS: Review of Systems  Constitutional:  Negative for activity change, appetite change, chills, fatigue and unexpected weight change.  HENT:  Negative for congestion, mouth sores and  sinus pressure.   Eyes:  Negative for visual disturbance.  Respiratory:  Negative for cough and chest tightness.   Gastrointestinal:  Negative for abdominal pain and nausea.  Genitourinary:  Negative for difficulty urinating, frequency and vaginal pain.  Musculoskeletal:  Positive for arthralgias, back pain, neck pain and neck stiffness. Negative for gait problem.  Skin:  Negative for pallor and rash.  Neurological:  Negative for dizziness, tremors, weakness, numbness and headaches.  Psychiatric/Behavioral:  Positive for dysphoric mood. Negative for confusion, sleep disturbance and suicidal ideas. The patient is nervous/anxious.     Objective:  BP 122/70   Pulse 84   Temp 97.8 F (36.6 C)   Ht 5' 2.8 (1.595 m)   Wt 170 lb (77.1 kg)   SpO2 97%   BMI 30.31 kg/m   BP Readings from Last 3 Encounters:  07/09/24 122/70  06/21/24 138/82  05/30/24 123/75    Wt Readings from Last 3 Encounters:  07/09/24 170 lb (77.1 kg)  06/21/24 169 lb 9.6 oz (76.9 kg)  06/15/24 169 lb (76.7 kg)    Physical Exam Constitutional:      General: She is not in acute distress.    Appearance: Normal appearance. She is well-developed.  HENT:     Head: Normocephalic.     Right Ear: External ear normal.     Left Ear: External ear normal.     Nose: Nose normal.  Eyes:     General:        Right eye: No discharge.        Left eye: No discharge.     Conjunctiva/sclera: Conjunctivae normal.     Pupils: Pupils are equal, round, and reactive to light.  Neck:     Thyroid : No thyromegaly.     Vascular: No JVD.     Trachea: No tracheal deviation.  Cardiovascular:     Rate and Rhythm: Normal rate and regular rhythm.     Heart sounds: Normal heart sounds.  Pulmonary:     Effort: No respiratory distress.     Breath sounds: No stridor. No wheezing.  Abdominal:     General: Bowel sounds are normal. There is no distension.     Palpations: Abdomen is soft. There is no mass.     Tenderness: There is no  abdominal tenderness. There is no guarding or rebound.  Musculoskeletal:        General: Tenderness present.     Cervical back: Normal range of motion and neck supple. No rigidity.     Right lower leg: No edema.     Left lower leg: No edema.  Lymphadenopathy:     Cervical: No cervical adenopathy.  Skin:    Findings: No  erythema or rash.  Neurological:     Mental Status: She is oriented to person, place, and time.     Cranial Nerves: No cranial nerve deficit.     Motor: No abnormal muscle tone.     Coordination: Coordination normal.     Deep Tendon Reflexes: Reflexes normal.  Psychiatric:        Behavior: Behavior normal.        Thought Content: Thought content normal.        Judgment: Judgment normal.     Lab Results  Component Value Date   WBC 5.2 05/30/2024   HGB 13.8 05/30/2024   HCT 41.3 05/30/2024   PLT 324 05/30/2024   GLUCOSE 140 (H) 05/30/2024   CHOL 161 12/04/2020   TRIG 101.0 12/04/2020   HDL 40.20 12/04/2020   LDLCALC 101 (H) 12/04/2020   ALT 28 05/30/2024   AST 20 05/30/2024   NA 137 05/30/2024   K 3.9 05/30/2024   CL 99 05/30/2024   CREATININE 0.88 05/30/2024   BUN 13 05/30/2024   CO2 29 05/30/2024   TSH 0.33 (L) 11/21/2023   HGBA1C 6.2 11/21/2023    No results found.  Assessment & Plan:   Problem List Items Addressed This Visit     Cervical disc disorder with radiculopathy of cervical region - Primary    On Oxy 5 mg prn Chronic problem.  Reasonably well controlled on current therapy.  Potential benefits of a long term opioids use as well as potential risks (i.e. addiction risk, apnea etc) and complications (i.e. Somnolence, constipation and others) were explained to the patient and were aknowledged.        Relevant Medications   gabapentin  (NEURONTIN ) 100 MG capsule   LORazepam  (ATIVAN ) 0.5 MG tablet   Other Relevant Orders   Comprehensive metabolic panel with GFR   Hemoglobin A1c   TSH   Urinalysis   CBC with Differential/Platelet    Fatty liver   Off Tylenol       Relevant Orders   Comprehensive metabolic panel with GFR   Hemoglobin A1c   TSH   Urinalysis   CBC with Differential/Platelet   Grief   Grieving her son and sister's passing Discussed      HTN (hypertension), benign   Cont w/Amlodipine , Micardis  HCT Monitor BP at home - nl BP per pt NAS diet       Relevant Orders   Comprehensive metabolic panel with GFR   Hemoglobin A1c   TSH   Urinalysis   CBC with Differential/Platelet   Vitamin D  deficiency   On Vit D Oseopenia on DEXA 2025 - GYN offered Prolia  as I understand         Meds ordered this encounter  Medications   gabapentin  (NEURONTIN ) 100 MG capsule    Sig: TAKE 1 CAPSULE BY MOUTH THREE TIMES A DAY AS NEEDED    Dispense:  90 capsule    Refill:  3   LORazepam  (ATIVAN ) 0.5 MG tablet    Sig: Take 1 tablet (0.5 mg total) by mouth 2 (two) times daily as needed for anxiety or sleep.    Dispense:  60 tablet    Refill:  3    This request is for a new prescription for a controlled substance as required by Federal/State law.   oxyCODONE  (ROXICODONE ) 5 MG immediate release tablet    Sig: Take 1 tablet (5 mg total) by mouth every 8 (eight) hours as needed for severe pain (pain score 7-10).  Dispense:  90 tablet    Refill:  0    Code: M50.10      Follow-up: Return in about 3 months (around 10/09/2024) for a follow-up visit.  Marolyn Noel, MD

## 2024-07-09 NOTE — Assessment & Plan Note (Signed)
 Grieving her son and sister's passing Discussed

## 2024-07-10 ENCOUNTER — Ambulatory Visit: Payer: Self-pay | Admitting: Internal Medicine

## 2024-07-17 ENCOUNTER — Telehealth: Payer: Self-pay

## 2024-07-17 DIAGNOSIS — N952 Postmenopausal atrophic vaginitis: Secondary | ICD-10-CM

## 2024-07-17 MED ORDER — ESTRADIOL 10 MCG VA TABS: 1.0000 | ORAL_TABLET | VAGINAL | 6 refills | Status: AC

## 2024-07-17 NOTE — Telephone Encounter (Signed)
 [employer] sent appeal form for Intrarosa .  Patient has to try and fail or have contraindication to formulary alternatives (none indicated in chart). Patient's covered formulary alternatives are:  estradiol  tablets, Estring , Femring, imvexxy , yuvafem , osphena, premarin vaginal cream.   Advise.

## 2024-07-17 NOTE — Telephone Encounter (Signed)
 Spoke to patient, notified patient her insurance would not cover Intrarosa  and Dr. Glennon was sending a new prescription for Vagifem to her pharmacy.  Patient is to use twice weekly vaginally.  Patient verbalized understanding and agreed with plan.  RX sent.

## 2024-09-10 ENCOUNTER — Other Ambulatory Visit: Payer: Self-pay | Admitting: Internal Medicine

## 2024-09-10 MED ORDER — SUCRALFATE 1 G PO TABS: ORAL_TABLET | ORAL | 3 refills | Status: AC

## 2024-09-10 NOTE — Telephone Encounter (Signed)
 Copied from CRM [id number]. Topic: Clinical - Medication Refill >> Sep 10, 2024  1:07 PM Frederich PARAS wrote: Medication: sucralfate  (CARAFATE ) 1 g tablet  Has the patient contacted their pharmacy? Yes They keep saying they did it but pt never gets it   [id number] - [place], [place] - [address] AT [facility] [address] [place] [address] Phone: [phone] Fax: [phone]   Is this the correct pharmacy for this prescription? Yes If no, delete pharmacy and type the correct one.   Has the prescription been filled recently? Yes  Is the patient out of the medication?no, 3 days supply left   Has the patient been seen for an appointment in the last year OR does the patient have an upcoming appointment? Yes  Can we respond through MyChart? Yes  Agent: Please be advised that Rx refills may take up to 3 business days. We ask that you follow-up with your pharmacy.

## 2024-10-09 ENCOUNTER — Ambulatory Visit: Admitting: Internal Medicine

## 2024-10-12 ENCOUNTER — Ambulatory Visit: Admitting: Internal Medicine

## 2024-11-29 ENCOUNTER — Ambulatory Visit: Admitting: Rheumatology

## 2025-07-10 ENCOUNTER — Encounter: Admitting: Internal Medicine

## 2025-07-10 ENCOUNTER — Ambulatory Visit
